# Patient Record
Sex: Male | Born: 1945 | Race: White | Hispanic: No | Smoking: Never smoker
Health system: Southern US, Community
[De-identification: ages and names within clinical notes are randomized; demographics above are authoritative.]

## PROBLEM LIST (undated history)

## (undated) DIAGNOSIS — J45909 Unspecified asthma, uncomplicated: Secondary | ICD-10-CM

## (undated) DIAGNOSIS — I10 Essential (primary) hypertension: Secondary | ICD-10-CM

## (undated) HISTORY — PX: REPLACEMENT TOTAL KNEE: SUR1224

## (undated) HISTORY — PX: TOTAL HIP ARTHROPLASTY: SHX124

---

## 2000-11-16 ENCOUNTER — Other Ambulatory Visit: Admission: RE | Admit: 2000-11-16 | Discharge: 2000-11-16 | Payer: Self-pay | Admitting: Urology

## 2000-11-16 ENCOUNTER — Encounter (INDEPENDENT_AMBULATORY_CARE_PROVIDER_SITE_OTHER): Payer: Self-pay | Admitting: Specialist

## 2000-12-25 ENCOUNTER — Encounter (INDEPENDENT_AMBULATORY_CARE_PROVIDER_SITE_OTHER): Payer: Self-pay | Admitting: *Deleted

## 2000-12-25 ENCOUNTER — Ambulatory Visit (HOSPITAL_COMMUNITY): Admission: RE | Admit: 2000-12-25 | Discharge: 2000-12-25 | Payer: Self-pay | Admitting: Gastroenterology

## 2001-08-31 ENCOUNTER — Encounter: Admission: RE | Admit: 2001-08-31 | Discharge: 2001-08-31 | Payer: Self-pay | Admitting: Family Medicine

## 2001-08-31 ENCOUNTER — Encounter: Payer: Self-pay | Admitting: Family Medicine

## 2002-08-20 ENCOUNTER — Encounter: Payer: Self-pay | Admitting: Family Medicine

## 2002-08-20 ENCOUNTER — Encounter: Admission: RE | Admit: 2002-08-20 | Discharge: 2002-08-20 | Payer: Self-pay | Admitting: Family Medicine

## 2002-11-26 ENCOUNTER — Encounter: Payer: Self-pay | Admitting: Orthopedic Surgery

## 2002-12-02 ENCOUNTER — Inpatient Hospital Stay (HOSPITAL_COMMUNITY): Admission: RE | Admit: 2002-12-02 | Discharge: 2002-12-05 | Payer: Self-pay | Admitting: Orthopedic Surgery

## 2003-09-17 ENCOUNTER — Inpatient Hospital Stay (HOSPITAL_COMMUNITY): Admission: RE | Admit: 2003-09-17 | Discharge: 2003-09-21 | Payer: Self-pay | Admitting: Orthopedic Surgery

## 2004-07-26 ENCOUNTER — Encounter: Admission: RE | Admit: 2004-07-26 | Discharge: 2004-07-26 | Payer: Self-pay | Admitting: Family Medicine

## 2007-06-08 ENCOUNTER — Inpatient Hospital Stay (HOSPITAL_COMMUNITY): Admission: EM | Admit: 2007-06-08 | Discharge: 2007-06-11 | Payer: Self-pay | Admitting: Emergency Medicine

## 2010-06-04 ENCOUNTER — Encounter (HOSPITAL_COMMUNITY): Payer: Self-pay | Admitting: Radiology

## 2010-06-04 ENCOUNTER — Emergency Department (HOSPITAL_COMMUNITY)
Admission: EM | Admit: 2010-06-04 | Discharge: 2010-06-04 | Disposition: A | Discharge status: Home / Self Care | Payer: Non-veteran care | Attending: Emergency Medicine | Admitting: Emergency Medicine

## 2010-06-04 ENCOUNTER — Emergency Department (HOSPITAL_COMMUNITY): Payer: Non-veteran care

## 2010-06-04 DIAGNOSIS — Z794 Long term (current) use of insulin: Secondary | ICD-10-CM | POA: Insufficient documentation

## 2010-06-04 DIAGNOSIS — R109 Unspecified abdominal pain: Secondary | ICD-10-CM | POA: Insufficient documentation

## 2010-06-04 DIAGNOSIS — K5900 Constipation, unspecified: Secondary | ICD-10-CM | POA: Insufficient documentation

## 2010-06-04 DIAGNOSIS — I10 Essential (primary) hypertension: Secondary | ICD-10-CM | POA: Insufficient documentation

## 2010-06-04 DIAGNOSIS — K449 Diaphragmatic hernia without obstruction or gangrene: Secondary | ICD-10-CM | POA: Insufficient documentation

## 2010-06-04 DIAGNOSIS — R112 Nausea with vomiting, unspecified: Secondary | ICD-10-CM | POA: Insufficient documentation

## 2010-06-04 DIAGNOSIS — K8020 Calculus of gallbladder without cholecystitis without obstruction: Secondary | ICD-10-CM | POA: Insufficient documentation

## 2010-06-04 DIAGNOSIS — Z96659 Presence of unspecified artificial knee joint: Secondary | ICD-10-CM | POA: Insufficient documentation

## 2010-06-04 DIAGNOSIS — E119 Type 2 diabetes mellitus without complications: Secondary | ICD-10-CM | POA: Insufficient documentation

## 2010-06-04 HISTORY — DX: Essential (primary) hypertension: I10

## 2010-06-04 LAB — CBC
Hemoglobin: 10.5 g/dL — ABNORMAL LOW (ref 13.0–17.0)
Platelets: 317 10*3/uL (ref 150–400)
RBC: 3.34 MIL/uL — ABNORMAL LOW (ref 4.22–5.81)
WBC: 15.1 10*3/uL — ABNORMAL HIGH (ref 4.0–10.5)

## 2010-06-04 LAB — BASIC METABOLIC PANEL
CO2: 24 mEq/L (ref 19–32)
Chloride: 101 mEq/L (ref 96–112)
GFR calc Af Amer: >60 mL/min (ref >60)
Potassium: 3.4 mEq/L — ABNORMAL LOW (ref 3.5–5.1)
Sodium: 133 mEq/L — ABNORMAL LOW (ref 135–145)

## 2010-06-04 LAB — HEPATIC FUNCTION PANEL
AST: 41 U/L — ABNORMAL HIGH (ref 0–37)
Albumin: 2.4 g/dL — ABNORMAL LOW (ref 3.5–5.2)
Alkaline Phosphatase: 65 U/L (ref 39–117)
Total Bilirubin: 1.2 mg/dL (ref 0.3–1.2)
Total Protein: 6.3 g/dL (ref 6.0–8.3)

## 2010-06-04 LAB — DIFFERENTIAL
Basophils Absolute: 0 10*3/uL (ref 0.0–0.1)
Basophils Relative: 0 % (ref 0–1)
Monocytes Relative: 11 % (ref 3–12)
Neutro Abs: 10.7 10*3/uL — ABNORMAL HIGH (ref 1.7–7.7)
Neutrophils Relative %: 71 % (ref 43–77)

## 2010-06-30 ENCOUNTER — Ambulatory Visit: Payer: Non-veteran care | Attending: Physician Assistant | Admitting: Physical Therapy

## 2010-06-30 DIAGNOSIS — M25559 Pain in unspecified hip: Secondary | ICD-10-CM | POA: Insufficient documentation

## 2010-06-30 DIAGNOSIS — R262 Difficulty in walking, not elsewhere classified: Secondary | ICD-10-CM | POA: Insufficient documentation

## 2010-06-30 DIAGNOSIS — IMO0001 Reserved for inherently not codable concepts without codable children: Secondary | ICD-10-CM | POA: Insufficient documentation

## 2010-06-30 DIAGNOSIS — M6281 Muscle weakness (generalized): Secondary | ICD-10-CM | POA: Insufficient documentation

## 2010-07-06 NOTE — H&P (Signed)
NAME:  Jeff Rodriguez, GRAPE NO.:  [id number]   MEDICAL RECORD NO.:  [id number]          PATIENT TYPE:  INP   LOCATION:  1830                         FACILITY:  [facility]   PHYSICIAN:  Ollen Gross, M.D.    DATE OF BIRTH:  [birth year]   DATE OF ADMISSION:  06/08/2007  DATE OF DISCHARGE:                              HISTORY & PHYSICAL   CHIEF COMPLAINT:  Right hip pain.   HISTORY OF PRESENT ILLNESS:  The patient is a 65 year old male who  injured his hip on the date of admission, when he was unloading a riding  lawn mower off a trailer.  A couple of gentleman were helping him.  The  lawn mower was unhitched and started rolling down the trailer and caught  his leg, injuring his right hip and leg.  He had the onset of pain and  was unable to stand and walk.  He was brought to the ED, where x-ray  showed he sustained a mildly displaced femoral neck fracture.  The  patient was subsequently admitted to the hospital.   ALLERGIES:  IODINE.   CURRENT MEDICATIONS:  1. Glucovance.  2. Metformin.  3. Zocor.  4. Aspirin 81 mg.  5. Actos.  6. Lisinopril.  7. Albuterol inhaler, which he uses very rarely.   PAST MEDICAL HISTORY:  1. Diabetes.  2. Asthma.  3. History of a clavicle fracture.  4. History of right wrist fracture.   PAST SURGICAL HISTORY:  1. Left total knee in October 2004.  2. Right total knee in July 2005.   SOCIAL HISTORY:  Married, 2 children.  Denies use of tobacco, alcohol,  or other products.   FAMILY HISTORY:  Father deceased at 64 with lung cancer.  Mother living  at age 22, relatively good health.   REVIEW OF SYSTEMS:  GENERAL:  No fevers, chills, or night sweats.  NEURO:  No seizures, syncope, or paralysis.  RESPIRATORY:  No shortness  of breath.  No cough or hemoptysis.  CARDIOVASCULAR:  No chest pain.  GI:  No nausea, vomiting, diarrhea, or constipation.  GU:  No dysuria,  hematuria, or discharge.  MUSCULOSKELETAL:  Right hip.   PHYSICAL  EXAMINATION:  VITAL SIGNS:  Pulse 96, respirations 16, blood  pressure 135/83, and temp 98.3.  GENERAL:  The patient is a 65 year old white male, well nourished, well  developed, in no acute distress.  Appears to be a good historian.  He is  alert, oriented, and cooperative.  Seen on stretcher in the emergency  department.  HEENT:  Normocephalic and atraumatic.  Pupils equal, round, and  reactive.  Oropharynx clear.  EOMs intact.  NECK:  Supple.  CHEST:  Clear anterior and posterior chest walls.  No rhonchi, rales, or  wheezing.  HEART:  Regular rate and rhythm.  No murmur.  S1 and S2 noted.  ABDOMEN:  Soft and nontender.  Bowel sounds present.  BREASTS, RECTAL, GENITALIA: Not done, not pertinent to present illness.  EXTREMITIES:  Right leg is slightly shortened and externally rotated as  compared to the left.  Motor function intact.  Sensation intact.  Pulses  noted to be intact.   IMPRESSION:  1. Right femoral neck fracture.  2. Diabetes mellitus.  3. Asthma.  4. History of clavicle fracture.  5. History of right wrist fracture.   PLAN:  The patient admitted to [facility] to undergo a right  hip pinning.  Surgery will be performed by Dr. Ollen Gross.      Alexzandrew L. Perkins, P.A.C.      Ollen Gross, M.D.  Electronically Signed    ALP/MEDQ  D:  06/09/2007  T:  06/09/2007  Job:  [id number]

## 2010-07-06 NOTE — Op Note (Signed)
NAME:  Jeff Rodriguez, Jeff Rodriguez NO.:  [id number]   MEDICAL RECORD NO.:  [id number]          PATIENT TYPE:  INP   LOCATION:  1830                         FACILITY:  [facility]   PHYSICIAN:  Ollen Gross, M.D.    DATE OF BIRTH:  [birth year]   DATE OF PROCEDURE:  06/09/2007  DATE OF DISCHARGE:                               OPERATIVE REPORT   PREOPERATIVE DIAGNOSIS:  Partially displaced right femoral neck  fracture.   POSTOPERATIVE DIAGNOSIS:  Partially displaced right femoral neck  fracture.   PROCEDURE:  Closed reduction and pinning, right femoral neck fracture.   SURGEON:  Dr. Lequita Halt.   ASSISTANT:  Alexzandrew L. Perkins, PA-C.   ANESTHESIA:  General.   ESTIMATED BLOOD LOSS:  Minimal.   DRAINS:  None.   COMPLICATIONS:  None.   CONDITION:  Stable.   BRIEF CLINICAL NOTE:  Mr. Karwowski is a 65 year old male who had a fall  today at home when he was attempting to help get a lawn mower off the  truck.  He slid and landed on his right hip.  He had immediate pain and  inability to get up.  He was taken to the emergency room where he had a  Garden III femoral neck fracture.  He presents now for closed reduction  and pinning.   PROCEDURE IN DETAIL:  After successful administration of general  anesthetic, the patient was placed on a fracture table with the right  lower extremity in a well-padded traction boot, left lower extremity in  well-padded leg holder.  Under fluoroscopic guidance, I applied minimal  traction, rotated internally to get him anatomically reduced.  There was  just a tiny bit of step-off inferiorly on the femoral neck.  Once, we  had it in adequate position, his thighs were prepped and draped in the  usual sterile fashion.  I placed a guidepin over the anterior thigh to  gauge our starting point for the pinning.  A small incision is made on  the lateral thigh.  I cut down through the subcutaneous tissue of fascia  lata, which was incised in line of the  skin incision.  We placed the  first guidepin so as to be in the center of femoral head and neck on the  AP and lateral views.  We put one pin superior to this and the other  inferior parallel pins.  The lengths are 90-mm, 95-mm, and 95-mm  respectively.  The 6.5-mm screw was then passed over the guidepins and  then tightened down to the lateral cortex of the femur.  It is shown to  be in excellent position in AP and lateral views.  I was very satisfied  with the reduction.  We then irrigated with saline solution, closed the  fascia lata with an interrupted 0 Vicryl, subcu with interrupted 2-0  Vicryl, and subcuticular with 4-0 Monocryl.  A  20 mL of 0.25% Marcaine with epinephrine was injected into the subcu  tissues and deep tissue around the incision.  Steri-Strips and bulky  sterile dressing were then applied, and he was awakened and transferred  to recovery room  in stable condition.      Ollen Gross, M.D.  Electronically Signed     FA/MEDQ  D:  06/09/2007  T:  06/09/2007  Job:  [id number]

## 2010-07-07 ENCOUNTER — Ambulatory Visit: Payer: Non-veteran care | Admitting: Physical Therapy

## 2010-07-09 ENCOUNTER — Ambulatory Visit: Payer: Non-veteran care | Admitting: Physical Therapy

## 2010-07-09 NOTE — Discharge Summary (Signed)
NAME:  Jeff Rodriguez, Jeff Rodriguez NO.:  [id number]   MEDICAL RECORD NO.:  [id number]          PATIENT TYPE:  INP   LOCATION:  5024                         FACILITY:  [facility]   PHYSICIAN:  Ollen Gross, M.D.    DATE OF BIRTH:  October 05, 1945   DATE OF ADMISSION:  06/08/2007  DATE OF DISCHARGE:  06/11/2007                               DISCHARGE SUMMARY   ADMITTING DIAGNOSES:  1. Right femoral neck fracture.  2. Diabetes mellitus.  3. Asthma.  4. History of clavicle fracture.  5. History of wrist fracture.   DISCHARGE DIAGNOSES:  1. Partially displaced right femoral neck fracture, status post closed      reduction pinning of the right femoral neck.  2. Diabetes mellitus.  3. Asthma.  4. History of clavicle fracture.  5. History of wrist fracture.   PROCEDURE:  On June 09, 2007, closed reduction pinning of right femoral  neck.   SURGEON:  Ollen Gross, MD   ASSISTANT:  Alexzandrew L. Julien Girt, PA-C   ANESTHESIA:  General.   CONSULTS:  None.   BRIEF HISTORY:  Mr. On is a 65 year old male, who had a fall on the  date of admission, attempting to get a lawn mower off a truck.  The lawn  mower rolled back, knocking him down and landing onto his hip.  He had  immediate onset of pain, brought to the emergency room and found to have  femoral neck fracture, and admitted for surgical intervention.   LABORATORY DATA:  CBC on admission showed hemoglobin of 14.3, hematocrit  42.4, white cell count 15.9, and platelets 234.  Differential  neutrophils 77, lymphs 15, monos 7, eosos 7, and basos 0.  Postop  hemoglobin 13.3, last H and H 13.7 and 39.5.  PT/PTT on preop 13.4 and  28 respectively.  INR 1.0.  Serial protimes followed by serial PT/INR  16.3 and 1.3.  CMET on admission, elevated glucose of 206, known  diabetic.  Remaining Chem panel, all within normal limits.  Serial BMETs  are followed.  Electrolytes remained within normal limits.  Glucose went  up to 231.   X-rays of hip film on June 09, 2007, interoperative right hip fixation.   EKG on June 08, 2007, normal sinus rhythm with short PR, otherwise  normal.  Confirmed by Dr. Greta Doom.   HOSPITAL COURSE:  The patient admitted to [facility] on June 08, 2007, for the above-stated problem.  Placed at bedrest and underwent  lab work.  He was taken to the operating room later that evening in the  early hour mornings of June 09, 2007, about 1:30 in the morning.  He  underwent the above procedure without difficulty, tolerated procedure  well, later brought to recovery room orthopedic floor, given p.o. and IV  analgesic pain control.  Following surgery, she was given 24-hour  postoperative antibiotics.  Started on Coumadin.  Later that afternoon  of postop day 1, he was doing well.  Started getting out of bed.  Weightbearing as tolerated.  Did actually walk in the hallway on day  one, by 2, he was  doing better.  Leg felt little heavy due to the  weakness.  Hemoglobin was stable at 13.5.  Dressing was changed,  incision looked good.  Blood pressure was stable.  Continue to receive  therapy.  Discharge planning got involved to help arrange for  postoperative home health care.  On the morning of day 3, on June 18, 2007, the patient is doing well, got up and down steps, walking over 200  feet.  Tolerating his meds well.  INR was slowly coming up to 1.3.  We  gave him 1 dose of Lovenox and discharged home later that day.   DISCHARGE PLAN:  The patient discharged home on June 11, 2007.   DISCHARGE DIAGNOSIS:  Please see above.   DISCHARGE MEDICATIONS:  Include Percocet, Robaxin, Coumadin, and he is  to start back on his aspirin once he stopped his Coumadin protocol.   ACTIVITY:  Weightbearing as tolerated, home health PT, and home health  nursing.  Gait training ambulation.   FOLLOWUP:  He needs to follow with Dr. Lequita Halt, 2 weeks from date of  surgery, contact the office at  [phone].   DISPOSITION:  Home.  He may start showering.   CONDITION ON DISCHARGE:  Improved.      Alexzandrew L. Perkins, P.A.C.      Ollen Gross, M.D.  Electronically Signed    ALP/MEDQ  D:  07/27/2007  T:  07/28/2007  Job:  [id number]

## 2010-07-09 NOTE — Op Note (Signed)
NAME:  Jeff Rodriguez, Jeff Rodriguez                       ACCOUNT NO.:  [id number]   MEDICAL RECORD NO.:  [id number]                   PATIENT TYPE:  INP   LOCATION:  2869                                 FACILITY:  [facility]   PHYSICIAN:  Elana Alm. Thurston Hole, M.D.              DATE OF BIRTH:  August 25, 1945   DATE OF PROCEDURE:  09/17/2003  DATE OF DISCHARGE:                                 OPERATIVE REPORT   PREOPERATIVE DIAGNOSIS:  Right knee degenerative joint disease.   POSTOPERATIVE DIAGNOSIS:  Right knee degenerative joint disease.   PROCEDURE:  Right total knee replacement using [employer] Scorpio total knee  system with #7 cemented femur, #9 cemented tibia, with 15 mm polyethylene  flex tibial spacer and 28 mm polyethylene cemented patella.   SURGEON OF RECORD:  Elana Alm. Thurston Hole, M.D.   ASSISTANT:  Julien Girt, P.A.   OPERATIVE TIME:  1 hour 20 minutes.   COMPLICATIONS:  None.   DESCRIPTION OF PROCEDURE:  Mr. Jeff Rodriguez was brought to the operating room on  September 17, 2003, after a femoral nerve block had been placed in the holding  room by anesthesia.  He was placed on the operating table in supine  position.  He received Ancef 1 g IV preoperatively for prophylaxis.  After  being placed under general anesthesia, he had a Foley catheter placed under  sterile conditions.  His right knee was examined under anesthesia.  Range of  motion from -5 to 125 degrees with mild varus deformity, the knee stable to  ligamentous exam with normal patellar tracking.  The right leg was prepped  using sterile Duraprep and draped using sterile technique.  The leg was  exsanguinated and a thigh tourniquet elevated to 375 mm.  Initially through  a 15 cm longitudinal incision based over the patella, initial exposure was  made.  The underlying subcutaneous tissues were incised along with the skin  incision.  A median arthrotomy was performed, revealing an excessive amount  of normal-appearing joint fluid.  The  articular surfaces were inspected.  He  had grade 4 changes medially, grade 3 changes laterally, and grade 3-4  changes in the patellofemoral joint.  Osteophytes were removed from the  femoral condyles and tibial plateau.  Medial and lateral meniscal remnants  were removed as well as the anterior cruciate ligament.  The intramedullary  drill was then drilled up the femoral canal for placement of the distal  femoral cutting jig, which was placed in the appropriate amount of rotation  and a distal 12 mm cut was made.  The distal femur was then sized.  A #7 was  found to be the appropriate size, and a #7 cutting jig was placed and then  these cuts were made.  The proximal tibia was then exposed.  The tibial  spines were removed with an oscillating saw.  Intramedullary drill was then  drilled down the tibial canal for placement of the  proximal tibial cutting  jig, which was placed in the appropriate amount of rotation and a proximal 6  mm cut was made.  After this was done the Scorpio PCL cutter was placed back  on the distal femur and these cuts were made.  At this point, then the #7  femoral trial was placed.  The #9 tibial base plate trial was placed and  with a 15 mm polyethylene spacer, with a 15 mm polyethylene spacer there was  found to be excellent restoration of normal alignment, excellent stability,  range of motion of 0-120 degrees.  The tibial base plate was then marked for  rotation and the keel cut was made.  After this was done the patella was  sized.  A 28 mm was found to be the appropriate size, and a recessed 10 x 28  mm cut was made and three locking holes were placed.  At this time it was  felt that all the trial components were of excellent size, fit, and  stability.  They were then removed.  The knee was then jet-lavaged,  irrigated with 3 L of saline solution.  The proximal tibia was then exposed  and the #9 tibial base plate with cement backing was hammered into  position.  It had excellent fit, with excess cement being removed from around the  edges.  The #7 femoral component with cement backing was hammered into  position also with an excellent fit, with excess cement being removed from  around the edges.  The 15 mm polyethylene tibial spacer was then locked on  the tibial base plate.  The knee was taken through a range of motion 0-125  degrees with excellent stability, excellent correction of his varus  deformity.  A 28 mm polyethylene cement-backed patella was then locked into  its recessed hole and held there with a clamp.  After the cement had  hardened, patellofemoral tracking was evaluated and this was found to be  normal.  At this point it was felt that all the components were of excellent  size, fit, and stability.  The knee was further irrigated with saline and  then the arthrotomy was closed with #1 Ethibond suture over two medium  Hemovac drains.  Subcutaneous tissue was closed with 0 and 2-0 Vicryl and  the skin closed with skin staples.  Sterile dressings were applied  Hemovac  injected with 0.25% Marcaine with epinephrine and clamped.  The tourniquet  was released.  Patient awakened, extubated, and taken to the recovery room  in stable condition.  Needle and sponge counts correct x2 at the end of the  case.                                               Robert A. Thurston Hole, M.D.    RAW/MEDQ  D:  09/17/2003  T:  09/17/2003  Job:  [id number]

## 2010-07-09 NOTE — Discharge Summary (Signed)
NAME:  STETSON, PELAEZ                       ACCOUNT NO.:  [id number]   MEDICAL RECORD NO.:  [id number]                   PATIENT TYPE:  INP   LOCATION:  5015                                 FACILITY:  [facility]   PHYSICIAN:  Elana Alm. Thurston Hole, M.D.              DATE OF BIRTH:  February 08, 1946   DATE OF ADMISSION:  12/02/2002  DATE OF DISCHARGE:  12/05/2002                                 DISCHARGE SUMMARY   ADMISSION DIAGNOSES:  1. Left knee degenerative joint disease.  2. Diabetes.  3. Hypertension.  4. Asthma.   DISCHARGE DIAGNOSES:  1. Left knee degenerative joint disease.  2. Diabetes.  3. Hypertension.  4. Asthma.   HISTORY OF PRESENT ILLNESS:  The patient is a 65 year old male who has a  longstanding history of bilateral knee DJD, left greater than right.  Tried  anti-inflammatories, cortisone injection, and a debriding arthroscopy, all  without success.  He has pain at night, pain at rest, pain with activity,  uncontrolled by p.o. pain medications.  He understands risks, benefits, and  potential complications of a left total knee replacement and is without  question.   PROCEDURES:  On December 02, 2002, the patient underwent a left total knee by  Dr. Thurston Hole followed by a left femoral nerve block by anesthesia.  He  tolerated both procedures well.  He was admitted postoperatively for pain  control, DVT prophylaxis, and physical therapy.   HOSPITAL COURSE:  On postop day #1, the patient was doing well.  Temperature  was 98.1.  Hemoglobin was 11.1.  INR was 1.2.  He was metabolically stable.  He had moderate drainage from his left leg.  He was 0 to 50 on CPM.  His  drain was discontinued.  His PCA was discontinued.  He was started on  Percocet for pain.   On postop day #2, patient doing well without complaint.  T-max 100.4.  Hemoglobin was 10.1.  Surgical wound was well approximated.  Neurovascularly  intact.  His IV was discontinued.  He was given permission to shower with  an  OpSite over his wound.  He is up with physical therapy.   On postop day #3, the patient was nauseated, had not had a bowel movement.  T-max 100.9.  Potassium was 3.2.  He was given K-Dur 40 mEq p.o.  He was  discharged late in the afternoon after a bowel movement.   He was discharged to home, weightbearing as tolerated, in stable condition,  on a regular diet.   DISCHARGE MEDICATIONS:  1. Percocet 5/325 one to two q.4-6h. p.r.n. pain,  2. Coumadin 5 mg 2 tablets a day.  3. Crestor 10 mg 1 tablet a day.  4. Altace 10 mg one tablet a day.  5. Glucovance 5/500 one tablet a day.  6. Avandia 8 mg 1 tablet once a day.  7. Colace 100 mg 1 tablet twice a day.  8. Senokot S  2 tablets before dinner.   DISCHARGE INSTRUCTIONS:  1. He has been instructed to call with increased pain, increased redness,     increased drainage, or increased temperature.  2. He has also been instructed to make an appointment for October 25 or     October 26.      Kirstin Shepperson, P.A.                  Robert A. Thurston Hole, M.D.    KS/MEDQ  D:  01/24/2003  T:  01/25/2003  Job:  [id number]

## 2010-07-09 NOTE — Procedures (Signed)
[facility]  Patient:    Jeff Rodriguez, Jeff Rodriguez Visit Number: [id number] MRN: [id number]          Service Type: END Location: ENDO Attending Physician:  Nelda Marseille Dictated by:   Petra Kuba, M.D. Proc. Date: 12/25/00 Admit Date:  12/25/2000   CC:         Elvina Sidle, M.D.   Procedure Report  PROCEDURE:  Colonoscopy with multiple polypectomies.  INDICATIONS:  Screening.  Consent was signed after risks, benefits, methods and options were thoroughly discussed in the office.  MEDICATIONS:  Demerol 50, Versed 7.5.  PROCEDURE:  Rectal inspection was pertinent for external hemorrhoids.  Digital examination was negative.  Video colonoscope was inserted fairly easily advanced around the colon to the cecum.  On insertion we thought in the midtransverse was a small polyp seen and I thought we were able to be fine on the way back.  No other abnormalities were seen on insertion.  The cecum was identified by the appendiceal orifice and the ileocecal valve. Just next to the appendiceal orifice was a small pedunculated polyp which was carefully snared with a setting of 15 and 15.  Electrocautery was applied. Polyp was removed and suctioned through the scope and collected in the trap. Possibly, there was some residual tissue on the edge that was polypoid and two cold biopsies of that were obtained and put in the first container.  The scope was slowly withdrawn.  There was a rare right sided diverticula seen and just an occasional left sided on slow withdrawn.  In the more proximal transverse, two small polyps were seen.  One was snared, electrocautery applied, suctioned through the scope and collected in the trap.  The other one was hot biopsied. The scope was further withdrawn back to the splenic flexure.  No additional polyps were seen.  It was hard to know for sure if one of the two polyps we removed were the same ones we saw on insertion.   We went ahead and readvanced to the hepatic flexure a few times but could not find it.  The scope was then slowly withdrawn.  There was the occasional left-sided diverticula seen but no other polyps seen until we withdrew to the proximal rectum where a small polyp was seen, snared and electrocautery applied.  The polyp was suctioned through the scope and put in the third container.  Also in the rectum were three other tiny polyps which were hot biopsied and one other small polyp that was snared.  Electrocautery was applied and suctioned through the scope and collected in the trim.  The scope was retroflexed revealing some internal hemorrhoids.  The scope was drained and readvanced probably to the midtransverse.  Up to that point, the scope began to loop and again on slow withdrawal one more time the polyp seen on insertion we thought was not found. The air was suctioned and scope removed.  The patient tolerated the procedure well.  There was no obvious immediate complications.  ENDOSCOPIC DIAGNOSIS: 1. Internal and external hemorrhoid. 2. Occasional left rare right diverticula. 3. Multiple small rectal polyps.  Two snares were hot biopsied. 4. Transverse with two small polyps seen, one snared and one hot biopsied    more proximally. 5. Cecal small polyp snared and two cold biopsies of the edge. 6. Questionably one small transverse polyp seen on insertion but not    found on withdrawal with multiple looks. 7. Otherwise, within normal limits to the  terminal ileum.  PLAN: 1. Await pathology. 2. Two week customary and post-polypectomy instructions. 3. Happy to see back p.r.n., otherwise, return care to Dr. Milus Glazier for    the customary yearly rectals and guaiacs.  Probably will check his    screening a little sooner secondary to the possible missed polyp which    if we repeat colonoscopy would probably if seen on insertion remove at    that junction next time. Dictated by:   Petra Kuba, M.D. Attending Physician:  Nelda Marseille DD:  12/25/00 TD:  12/26/00 Job: [id number]

## 2010-07-09 NOTE — Discharge Summary (Signed)
NAME:  Jeff Rodriguez, Jeff Rodriguez                       ACCOUNT NO.:  [id number]   MEDICAL RECORD NO.:  [id number]                   PATIENT TYPE:  INP   LOCATION:  5010                                 FACILITY:  [facility]   PHYSICIAN:  Elana Alm. Thurston Hole, M.D.              DATE OF BIRTH:  Jun 24, 1945   DATE OF ADMISSION:  09/17/2003  DATE OF DISCHARGE:  09/21/2003                                 DISCHARGE SUMMARY   ADMISSION DIAGNOSES:  1.  End-stage degenerative joint disease right knee.  2.  Diabetes.  3.  Hypertension.   DISCHARGE DIAGNOSES:  1.  End-stage degenerative joint disease right knee.  2.  Diabetes.  3.  Hypertension.   HISTORY OF PRESENT ILLNESS:  The patient is a 65 year old male who has end-  stage DJD of his right knee.  He has tried conservative care including anti-  inflammatories, physical therapy, cortisone injections, and debriding  arthroscopy all without success.  He has pain at night, pain at rest, pain  with activity.  He understands the risks, benefits, and possible  complications of a right total knee replacement and is without question.   He received medical clearance from Dr. Allyson Sabal at [facility] due to a significant cardiac history.   PROCEDURES IN HOUSE:  On September 17, 2003, the patient underwent a right total  knee replacement by Dr. Thurston Hole.  He tolerated the procedure well.  Postoperatively, he had a femoral nerve block by anesthesia.  He was  admitted postop for pain control, DVT prophylaxis, and physical therapy.  Postop day #1, temperature of 100.2.  Surgical wound was well approximated.  His hemoglobin was 12.1.  His INR was 1.1.  He was slightly hyponatremic at  132 but was metabolically stable otherwise.  Sugars were slightly high at  185.  He was up with physical therapy and started on Coumadin.  Postop day  #2, T-max of 100, surgical wound was well approximated.  His hemoglobin was  11.6.  He had normalized his sodium to 135.  His  sugars were still a little  high at 174.  His surgical wound was well approximated.  He progressed well  with therapy and had good understanding of occupational therapy.  Postop day  #3, T-max of 100.8, hemoglobin was 10.7.  He was metabolically stable.  Sugar was 154.  His calf was soft.  Surgical wounds were well approximated.  Postop day #4, he was discharged to home in stable condition, T-max of 99,  hemoglobin was 10.  He was metabolically stable.  His sugar was 158.  His  surgical wound was well approximated.  He was discharged to home,  weightbearing as tolerated, on a regular diabetic diet.   DISCHARGE MEDICATIONS:  1.  Percocet one to two q.4-6 h. p.r.n. pain.  2.  Robaxin 500 mg one q.6 h. p.r.n. spasm.  3.  Coumadin 5 mg 1-1/2 tablets a day.  4.  Glucovance 5/500 mg one tablet every day.  5.  Altace 10 mg one tablet once a day.  6.  Avandia 8 mg one tablet once a day.  7.  Lovenox injections 30 mg one injection subcu twice a day until his INR      is therapeutic.   He has been instructed to use his CPM 0 to 60 degrees 8 hours a day,  increase by 5 degrees a day until 90 degrees.  He has been instructed to  place a folded pillow under his right heel for 30 minutes a day to passively  work on extension.  He will have home health PT, OT, RN, and a CPM.  He will  call with an increased pain, increased redness, increased drainage, or a  temp greater than 101.  He will follow up with Dr. Thurston Hole on September 30, 2003.      Kirstin Shepperson, P.A.                  Robert A. Thurston Hole, M.D.    KS/MEDQ  D:  10/22/2003  T:  10/22/2003  Job:  [id number]

## 2010-07-09 NOTE — Op Note (Signed)
NAME:  Jeff Rodriguez, Jeff Rodriguez                       ACCOUNT NO.:  [id number]   MEDICAL RECORD NO.:  [id number]                   PATIENT TYPE:  INP   LOCATION:  5037                                 FACILITY:  [facility]   PHYSICIAN:  Elana Alm. Thurston Hole, M.D.              DATE OF BIRTH:  1945-12-08   DATE OF PROCEDURE:  12/02/2002  DATE OF DISCHARGE:                                 OPERATIVE REPORT   PREOPERATIVE DIAGNOSIS:  Left knee degenerative joint disease.   POSTOPERATIVE DIAGNOSIS:  Left knee degenerative joint disease.   OPERATION PERFORMED:  Left total knee replacement using [employer] Scorpio  total knee system with #7 cemented femoral component, #9 cemented tibial  component with 18 mm polyethylene flexed tibial spacer and a 28 mm  polyethylene cemented patella.   SURGEON:  Elana Alm. Thurston Hole, M.D.   ASSISTANT:  Julien Girt, P.A.   ANESTHESIA:  General.   OPERATIVE TIME:  One hour and 20 minutes.   COMPLICATIONS:  None.   DESCRIPTION OF PROCEDURE:  Mr. Agyeman was brought to the operating room on  December 02, 2002, placed on the operating table in supine position.  After  an adequate level of general anesthesia was obtained, his left knee was  examined under anesthesia.  He had range of motion from -5 to 125 degrees  with 1+ crepitation with significant varus deformity, knee stable,  ligamentous exam with normal patella tracking.  He had a Foley catheter  placed under sterile conditions and received Ancef 1 gm IV preoperatively  for prophylaxis.  Initially, the leg was prepped using sterile Hibiclens and  alcohol and draped using sterile technique.  The leg was exsanguinated and a  thigh tourniquet elevated to 370 mmHg.  Initially, through a 15 cm  longitudinal incision based over the patella, initial exposure was made.  The underlying subcutaneous tissues were incised in line with the skin  incision.  A median arthrotomy was performed revealing an excessive amount  of  normal-appearing joint fluid.  The articular surfaces were inspected.  He  had grade 4 changes medially, grade 3 changes laterally and grade 3 and 4  changes in the patellofemoral joint.  The osteophytes were removed from the  femoral condyles and tibial plateau.  The medial and lateral meniscal  remnants were removed as well as the anterior cruciate ligament.  The  intramedullary drill was then drilled up the femoral canal for placement of  the distal femoral  jig which was placed in the appropriate amount of  rotation and a distal 12 mm cut was made.  Distal femur was then sized.  A  #7 was found to be the appropriate size and a #7 cutting jig was placed and  these cuts were made.  The proximal tibia was then exposed.  Tibial spines  were removed with an oscillating saw.  Intramedullary drill was drilled down  the tibial canal for placement of the  proximal tibial cutting jig which was  placed in the appropriate amount of rotation and a proximal 4 mm cut was  made.  After this was done, the Scorpio PCL cutter was placed back on the  distal femur and these cuts were made.  At this point then the femoral trial  #7 was placed.  The #9 tibial base trial  was placed and with an 18 mm  polyethylene spacer, there was found to be excellent correction of his  deformity, excellent stability, range of motion 0 to 125 degrees with no  lift off on the tray.  The tibial base plate was then marked for rotation  and the keel cut was made.  After this was done, the patella was sized.  A  28 mm was found to be the appropriate size and a recessed 10 mm x 28 mm cut  was made and three locking holes were placed.  After this was done, it was  felt that all of the trial components were of excellent size, fit and  stability. They were then removed.  The knee was then jet lavage irrigated  with three liters of saline solution.  The proximal tibia was then exposed.  A #9 tibial base plate with cement backing was  then hammered into position  with an excellent fit with excess cement being removed from around the  edges.  The #7 femoral component with cement backing was hammered into  position also with an excellent fit with excess cement being removed from  around the edges.  The 18 mm polyethylene flexed tibial spacer was then  locked on the tibial base plate and the knee taken through a range of motion  0 to 125 degrees with excellent stability, no lift off on the tray.  The 28  mm polyethylene cement backed patella was then locked into this recessed  hole and held there with a clamp.  After the cement hardened, the  patellofemoral tracking was evaluated and this was found to be normal.  At  this point it was felt that all components were of excellent size, fit and  stability. The knee was further irrigated with antibiotic solution and then  the median arthrotomy was closed with #1 Ethibond suture over two medium  Hemovac drains.  Subcutaneous tissues were closed with 0 and 2-0 Vicryl.  Skin closed with skin staples.  Sterile dressings were applied.  The Hemovac  injected with 0.25% Marcaine with epinephrine and clamped.  Tourniquet was  released.  The patient then had a femoral nerve block placed by anesthesia  for postoperative pain control.  He was then awakened, extubated and taken  to recovery room in stable condition.  The sponge and needle counts were  correct times two at the end of this case.                                                Robert A. Thurston Hole, M.D.    RAW/MEDQ  D:  12/02/2002  T:  12/02/2002  Job:  [id number]

## 2010-07-13 ENCOUNTER — Ambulatory Visit: Payer: Non-veteran care | Admitting: Physical Therapy

## 2010-07-16 ENCOUNTER — Ambulatory Visit: Payer: Non-veteran care | Admitting: Physical Therapy

## 2010-11-16 LAB — COMPREHENSIVE METABOLIC PANEL
ALT: 19
AST: 20
Alkaline Phosphatase: 49
CO2: 24
Calcium: 9.1
Chloride: 101
GFR calc Af Amer: >60
GFR calc non Af Amer: >60
Glucose, Bld: 206 — ABNORMAL HIGH
Sodium: 136
Total Bilirubin: 1.1

## 2010-11-16 LAB — CBC
HCT: 39.5
HCT: 39.8
HCT: 42.4
Hemoglobin: 13.5
Hemoglobin: 14.3
MCV: 90.7
MCV: 90.9
Platelets: 185
Platelets: 197
Platelets: 234
RBC: 4.29
RBC: 4.33
RDW: 13.9
RDW: 13.9
WBC: 12.4 — ABNORMAL HIGH
WBC: 12.6 — ABNORMAL HIGH
WBC: 13.2 — ABNORMAL HIGH

## 2010-11-16 LAB — BASIC METABOLIC PANEL
BUN: 12
Calcium: 8.5
Calcium: 8.6
Chloride: 101
Creatinine, Ser: 0.78
GFR calc Af Amer: >60
GFR calc Af Amer: >60
GFR calc non Af Amer: >60
GFR calc non Af Amer: >60
Glucose, Bld: 231 — ABNORMAL HIGH
Potassium: 3.8
Sodium: 136

## 2010-11-16 LAB — PROTIME-INR
INR: 1.1
INR: 1.2
Prothrombin Time: 13.4
Prothrombin Time: 14.3
Prothrombin Time: 16.3 — ABNORMAL HIGH

## 2010-11-16 LAB — DIFFERENTIAL
Basophils Absolute: 0.1
Eosinophils Absolute: 0
Eosinophils Relative: 0
Lymphocytes Relative: 15
Lymphs Abs: 2.4
Monocytes Absolute: 1.2 — ABNORMAL HIGH

## 2015-04-20 ENCOUNTER — Emergency Department (HOSPITAL_COMMUNITY): Payer: Medicare Other

## 2015-04-20 ENCOUNTER — Observation Stay (HOSPITAL_BASED_OUTPATIENT_CLINIC_OR_DEPARTMENT_OTHER): Payer: Medicare Other

## 2015-04-20 ENCOUNTER — Inpatient Hospital Stay (HOSPITAL_COMMUNITY)
Admission: EM | Admit: 2015-04-20 | Discharge: 2015-04-25 | DRG: 419 | Disposition: A | Discharge status: Home / Self Care | Payer: Medicare Other | Attending: Internal Medicine | Admitting: Internal Medicine

## 2015-04-20 ENCOUNTER — Encounter (HOSPITAL_COMMUNITY): Payer: Self-pay | Admitting: *Deleted

## 2015-04-20 DIAGNOSIS — K838 Other specified diseases of biliary tract: Secondary | ICD-10-CM

## 2015-04-20 DIAGNOSIS — Z96641 Presence of right artificial hip joint: Secondary | ICD-10-CM | POA: Diagnosis not present

## 2015-04-20 DIAGNOSIS — R1013 Epigastric pain: Secondary | ICD-10-CM

## 2015-04-20 DIAGNOSIS — R109 Unspecified abdominal pain: Secondary | ICD-10-CM

## 2015-04-20 DIAGNOSIS — N400 Enlarged prostate without lower urinary tract symptoms: Secondary | ICD-10-CM

## 2015-04-20 DIAGNOSIS — R079 Chest pain, unspecified: Secondary | ICD-10-CM | POA: Diagnosis not present

## 2015-04-20 DIAGNOSIS — R112 Nausea with vomiting, unspecified: Secondary | ICD-10-CM

## 2015-04-20 DIAGNOSIS — K8000 Calculus of gallbladder with acute cholecystitis without obstruction: Principal | ICD-10-CM | POA: Diagnosis present

## 2015-04-20 DIAGNOSIS — I10 Essential (primary) hypertension: Secondary | ICD-10-CM | POA: Diagnosis not present

## 2015-04-20 DIAGNOSIS — E11649 Type 2 diabetes mellitus with hypoglycemia without coma: Secondary | ICD-10-CM | POA: Diagnosis not present

## 2015-04-20 DIAGNOSIS — Z96653 Presence of artificial knee joint, bilateral: Secondary | ICD-10-CM | POA: Diagnosis present

## 2015-04-20 DIAGNOSIS — Z91013 Allergy to seafood: Secondary | ICD-10-CM

## 2015-04-20 DIAGNOSIS — K9189 Other postprocedural complications and disorders of digestive system: Secondary | ICD-10-CM

## 2015-04-20 DIAGNOSIS — E876 Hypokalemia: Secondary | ICD-10-CM | POA: Diagnosis not present

## 2015-04-20 DIAGNOSIS — R0789 Other chest pain: Secondary | ICD-10-CM | POA: Diagnosis not present

## 2015-04-20 DIAGNOSIS — K810 Acute cholecystitis: Secondary | ICD-10-CM | POA: Diagnosis present

## 2015-04-20 DIAGNOSIS — J45909 Unspecified asthma, uncomplicated: Secondary | ICD-10-CM | POA: Diagnosis present

## 2015-04-20 DIAGNOSIS — Z79899 Other long term (current) drug therapy: Secondary | ICD-10-CM

## 2015-04-20 DIAGNOSIS — E119 Type 2 diabetes mellitus without complications: Secondary | ICD-10-CM

## 2015-04-20 DIAGNOSIS — E118 Type 2 diabetes mellitus with unspecified complications: Secondary | ICD-10-CM | POA: Diagnosis not present

## 2015-04-20 DIAGNOSIS — K8010 Calculus of gallbladder with chronic cholecystitis without obstruction: Secondary | ICD-10-CM

## 2015-04-20 DIAGNOSIS — Z794 Long term (current) use of insulin: Secondary | ICD-10-CM

## 2015-04-20 DIAGNOSIS — K449 Diaphragmatic hernia without obstruction or gangrene: Secondary | ICD-10-CM | POA: Diagnosis present

## 2015-04-20 DIAGNOSIS — E785 Hyperlipidemia, unspecified: Secondary | ICD-10-CM

## 2015-04-20 DIAGNOSIS — Z7984 Long term (current) use of oral hypoglycemic drugs: Secondary | ICD-10-CM

## 2015-04-20 DIAGNOSIS — R7989 Other specified abnormal findings of blood chemistry: Secondary | ICD-10-CM | POA: Diagnosis present

## 2015-04-20 DIAGNOSIS — Z7982 Long term (current) use of aspirin: Secondary | ICD-10-CM

## 2015-04-20 HISTORY — DX: Unspecified asthma, uncomplicated: J45.909

## 2015-04-20 LAB — CBC
HEMATOCRIT: 40.7 % (ref 39.0–52.0)
HEMATOCRIT: 39.6 % (ref 39.0–52.0)
HEMOGLOBIN: 14 g/dL (ref 13.0–17.0)
HEMOGLOBIN: 13.8 g/dL (ref 13.0–17.0)
MCH: 31 pg (ref 26.0–34.0)
MCH: 31.7 pg (ref 26.0–34.0)
MCHC: 34.4 g/dL (ref 30.0–36.0)
MCHC: 34.8 g/dL (ref 30.0–36.0)
MCV: 90 fL (ref 78.0–100.0)
MCV: 90.8 fL (ref 78.0–100.0)
Platelets: 238 10*3/uL (ref 150–400)
Platelets: 212 10*3/uL (ref 150–400)
RBC: 4.52 MIL/uL (ref 4.22–5.81)
RBC: 4.36 MIL/uL (ref 4.22–5.81)
RDW: 13.9 % (ref 11.5–15.5)
RDW: 13.8 % (ref 11.5–15.5)
WBC: 12.7 10*3/uL — ABNORMAL HIGH (ref 4.0–10.5)
WBC: 18.1 10*3/uL — ABNORMAL HIGH (ref 4.0–10.5)

## 2015-04-20 LAB — COMPREHENSIVE METABOLIC PANEL
ALT: 26 U/L (ref 17–63)
AST: 25 U/L (ref 15–41)
Albumin: 3.8 g/dL (ref 3.5–5.0)
Alkaline Phosphatase: 49 U/L (ref 38–126)
Anion gap: 12 (ref 5–15)
BILIRUBIN TOTAL: 0.7 mg/dL (ref 0.3–1.2)
BUN: 15 mg/dL (ref 6–20)
CO2: 24 mmol/L (ref 22–32)
CREATININE: 0.81 mg/dL (ref 0.61–1.24)
Calcium: 9.1 mg/dL (ref 8.9–10.3)
Chloride: 102 mmol/L (ref 101–111)
Glucose, Bld: 181 mg/dL — ABNORMAL HIGH (ref 65–99)
Potassium: 3.7 mmol/L (ref 3.5–5.1)
Sodium: 138 mmol/L (ref 135–145)
TOTAL PROTEIN: 8.1 g/dL (ref 6.5–8.1)

## 2015-04-20 LAB — CBG MONITORING, ED
Glucose-Capillary: 140 mg/dL — ABNORMAL HIGH (ref 65–99)
Glucose-Capillary: 77 mg/dL (ref 65–99)

## 2015-04-20 LAB — I-STAT CHEM 8, ED
BUN: 20 mg/dL (ref 6–20)
CALCIUM ION: 0.96 mmol/L — AB (ref 1.13–1.30)
Chloride: 104 mmol/L (ref 101–111)
Creatinine, Ser: 0.7 mg/dL (ref 0.61–1.24)
Glucose, Bld: 180 mg/dL — ABNORMAL HIGH (ref 65–99)
HEMATOCRIT: 46 % (ref 39.0–52.0)
Hemoglobin: 15.6 g/dL (ref 13.0–17.0)
Potassium: 4.1 mmol/L (ref 3.5–5.1)
SODIUM: 139 mmol/L (ref 135–145)
TCO2: 24 mmol/L (ref 0–100)

## 2015-04-20 LAB — I-STAT TROPONIN, ED: TROPONIN I, POC: 0.01 ng/mL (ref 0.00–0.08)

## 2015-04-20 LAB — LIPASE, BLOOD: Lipase: 31 U/L (ref 11–51)

## 2015-04-20 LAB — TROPONIN I
TROPONIN I: 0.04 ng/mL — AB (ref <0.031)
Troponin I: <0.03 ng/mL (ref <0.031)

## 2015-04-20 LAB — GLUCOSE, CAPILLARY: GLUCOSE-CAPILLARY: 96 mg/dL (ref 65–99)

## 2015-04-20 MED ORDER — DIPHENHYDRAMINE HCL 50 MG/ML IJ SOLN
25.0000 mg | Freq: Once | INTRAMUSCULAR | Status: AC
  Administered 2015-04-20: 25 mg via INTRAVENOUS
  Filled 2015-04-20: qty 1

## 2015-04-20 MED ORDER — INSULIN ASPART 100 UNIT/ML ~~LOC~~ SOLN
0.0000 [IU] | Freq: Three times a day (TID) | SUBCUTANEOUS | Status: DC
  Administered 2015-04-20: 1 [IU] via SUBCUTANEOUS
  Administered 2015-04-22: 2 [IU] via SUBCUTANEOUS
  Administered 2015-04-24: 1 [IU] via SUBCUTANEOUS
  Administered 2015-04-24: 2 [IU] via SUBCUTANEOUS
  Administered 2015-04-25: 1 [IU] via SUBCUTANEOUS
  Filled 2015-04-20: qty 1

## 2015-04-20 MED ORDER — ONDANSETRON HCL 4 MG/2ML IJ SOLN
4.0000 mg | Freq: Once | INTRAMUSCULAR | Status: AC
  Administered 2015-04-20: 4 mg via INTRAVENOUS
  Filled 2015-04-20: qty 2

## 2015-04-20 MED ORDER — PANTOPRAZOLE SODIUM 40 MG IV SOLR
40.0000 mg | Freq: Once | INTRAVENOUS | Status: AC
  Administered 2015-04-20: 40 mg via INTRAVENOUS
  Filled 2015-04-20: qty 40

## 2015-04-20 MED ORDER — SODIUM CHLORIDE 0.9 % IV SOLN
INTRAVENOUS | Status: DC
  Administered 2015-04-20: 50 mL/h via INTRAVENOUS

## 2015-04-20 MED ORDER — ONDANSETRON HCL 4 MG/2ML IJ SOLN: 4.0000 mg | Freq: Four times a day (QID) | INTRAMUSCULAR | Status: DC | PRN

## 2015-04-20 MED ORDER — SODIUM CHLORIDE 0.9 % IV BOLUS (SEPSIS)
1000.0000 mL | Freq: Once | INTRAVENOUS | Status: AC
  Administered 2015-04-20: 1000 mL via INTRAVENOUS

## 2015-04-20 MED ORDER — TAMSULOSIN HCL 0.4 MG PO CAPS
0.4000 mg | ORAL_CAPSULE | Freq: Two times a day (BID) | ORAL | Status: DC
  Administered 2015-04-20 – 2015-04-25 (×10): 0.4 mg via ORAL
  Filled 2015-04-20 (×11): qty 1

## 2015-04-20 MED ORDER — INSULIN GLARGINE 100 UNIT/ML ~~LOC~~ SOLN
58.0000 [IU] | Freq: Every day | SUBCUTANEOUS | Status: DC
  Administered 2015-04-20 – 2015-04-21 (×2): 58 [IU] via SUBCUTANEOUS
  Filled 2015-04-20 (×3): qty 0.58

## 2015-04-20 MED ORDER — SIMVASTATIN 20 MG PO TABS
20.0000 mg | ORAL_TABLET | Freq: Every day | ORAL | Status: DC
  Administered 2015-04-21 – 2015-04-24 (×4): 20 mg via ORAL
  Filled 2015-04-20 (×5): qty 1

## 2015-04-20 MED ORDER — ASPIRIN 81 MG PO CHEW
81.0000 mg | CHEWABLE_TABLET | Freq: Every day | ORAL | Status: DC
  Administered 2015-04-21 – 2015-04-25 (×5): 81 mg via ORAL
  Filled 2015-04-20 (×5): qty 1

## 2015-04-20 MED ORDER — NITROGLYCERIN 2 % TD OINT
1.0000 [in_us] | TOPICAL_OINTMENT | Freq: Once | TRANSDERMAL | Status: AC
  Administered 2015-04-20: 1 [in_us] via TOPICAL
  Filled 2015-04-20: qty 1

## 2015-04-20 MED ORDER — GI COCKTAIL ~~LOC~~: 30.0000 mL | Freq: Four times a day (QID) | ORAL | Status: DC | PRN

## 2015-04-20 MED ORDER — LISINOPRIL 10 MG PO TABS
10.0000 mg | ORAL_TABLET | Freq: Every day | ORAL | Status: DC
  Administered 2015-04-21 – 2015-04-25 (×4): 10 mg via ORAL
  Filled 2015-04-20 (×6): qty 1

## 2015-04-20 MED ORDER — GI COCKTAIL ~~LOC~~
30.0000 mL | Freq: Once | ORAL | Status: AC
  Administered 2015-04-20: 30 mL via ORAL
  Filled 2015-04-20: qty 30

## 2015-04-20 MED ORDER — IOHEXOL 350 MG/ML SOLN
100.0000 mL | Freq: Once | INTRAVENOUS | Status: AC | PRN
  Administered 2015-04-20: 100 mL via INTRAVENOUS

## 2015-04-20 MED ORDER — MORPHINE SULFATE (PF) 2 MG/ML IV SOLN
2.0000 mg | INTRAVENOUS | Status: DC | PRN
  Administered 2015-04-20 – 2015-04-21 (×2): 2 mg via INTRAVENOUS
  Filled 2015-04-20 (×2): qty 1

## 2015-04-20 MED ORDER — MORPHINE SULFATE (PF) 4 MG/ML IV SOLN
4.0000 mg | Freq: Once | INTRAVENOUS | Status: AC
  Administered 2015-04-20: 4 mg via INTRAVENOUS
  Filled 2015-04-20: qty 1

## 2015-04-20 MED ORDER — ASPIRIN 81 MG PO CHEW
324.0000 mg | CHEWABLE_TABLET | Freq: Once | ORAL | Status: AC
  Administered 2015-04-20: 324 mg via ORAL
  Filled 2015-04-20: qty 4

## 2015-04-20 MED ORDER — ACETAMINOPHEN 325 MG PO TABS
650.0000 mg | ORAL_TABLET | ORAL | Status: DC | PRN
  Administered 2015-04-22: 650 mg via ORAL
  Filled 2015-04-20: qty 2

## 2015-04-20 MED ORDER — ENOXAPARIN SODIUM 40 MG/0.4ML ~~LOC~~ SOLN
40.0000 mg | SUBCUTANEOUS | Status: DC
  Administered 2015-04-20 – 2015-04-22 (×3): 40 mg via SUBCUTANEOUS
  Filled 2015-04-20 (×3): qty 0.4

## 2015-04-20 NOTE — ED Notes (Signed)
2nd report attempted and to check on status of bed in the room.

## 2015-04-20 NOTE — H&P (Signed)
[facility] History and Physical  Jeff Rodriguez [id number] DOB: 03-24-1945 DOA: 04/20/2015  PCP: No primary care provider on file.   Chief Complaint: Chest Pain  HPI: Jeff Rodriguez is a 70 y.o. male  HTN, hyperlipidemia, DM presenting with acute substernal chest pain since 1 am described as 9/10, sharp, constant,and stabbing in nature radiating to the back. Pain not worsened with deep inspiration, movement or exertion. Patient took 1 dose of ASA 325 mg.Denies any jaw pain. Denies any dizziness or falls. Denies shortness of breath or cough. Denies any fever or chills.Reported nausea, and non-bloody emesis x4 since symptoms began. He reports some mid epigastric pain after his last meal yesterday. Appetite is normal and eats salt rich foods. No diarrhea.Denies any leg swelling or calf pain. Denies any headaches or vision changes. She denies any seizures or confusion. Denies any prior history of cardiac disease. The patient never had an echocardiogram or seen by Cardiology. No recent long distance trips.   At the ED, patient received Nitroglycerin without significant relief, currently at 7/10 and was later placed on the patch. EKG shows NSR with borderline TW abnormalities, QTC 428. CMET and CBC are essentially normal-patient has a history of chronic idiopathic leukocytosis, currently at 12.7.Troponins are negative to date, 0.01. CT angio negative for PE or aortic dissection. There is LVH. Of note, incidental cholelithiasis and focal hiatal hernia seen.    Review of Systems  Constitutional: Negative.   HENT: Negative.   Eyes: Negative.   Respiratory: Negative.   Cardiovascular: Positive for chest pain. Negative for palpitations, orthopnea, claudication, leg swelling and PND.  Gastrointestinal: Positive for heartburn, nausea, vomiting and abdominal pain. Negative for diarrhea, constipation, blood in stool and melena.  Genitourinary: Positive for frequency. Negative for flank pain.         Has BPH  Musculoskeletal: Negative.   Skin: Negative.   Neurological: Negative.   Endo/Heme/Allergies: Negative.   Psychiatric/Behavioral: Negative.     Past Medical History  Diagnosis Date  . Diabetes mellitus   . Hypertension   . Asthma    Past Surgical History  Procedure Laterality Date  . Replacement total knee Bilateral   . Total hip arthroplasty Right    Social History:  reports that he has never smoked. He does not have any smokeless tobacco history on file. He reports that he does not drink alcohol or use illicit drugs.  Allergies  Allergen Reactions  . Shrimp [Shellfish Allergy] Anaphylaxis    Family History  Problem Relation Age of Onset  . Cancer - Lung Father   . Deep vein thrombosis Mother      Prior to Admission medications   Medication Sig Start Date End Date Taking? Authorizing Provider  aspirin 81 MG chewable tablet Chew 81 mg by mouth daily.   Yes Historical Provider, MD  insulin glargine (LANTUS) 100 UNIT/ML injection Inject 58 Units into the skin at bedtime.   Yes Historical Provider, MD  lisinopril (PRINIVIL,ZESTRIL) 20 MG tablet Take 10 mg by mouth daily.   Yes Historical Provider, MD  metFORMIN (GLUCOPHAGE) 500 MG tablet Take 500 mg by mouth 2 (two) times daily with a meal.   Yes Historical Provider, MD  simvastatin (ZOCOR) 40 MG tablet Take 20 mg by mouth daily.   Yes Historical Provider, MD  tamsulosin (FLOMAX) 0.4 MG CAPS capsule Take 0.4 mg by mouth 2 (two) times daily.   Yes Historical Provider, MD   Physical Exam: Filed Vitals:   04/20/15 0515 04/20/15 0530  04/20/15 0545 04/20/15 0826  BP:  177/85 107/80 145/73  Pulse:  70 65 84  Temp:      TempSrc:      Resp:  17 17 18   SpO2: 99% 100% 100% 96%    Wt Readings from Last 3 Encounters:  No data found for Wt    Physical Exam  Constitutional: He is well-developed, well-nourished, and in no distress. No distress.  Somewhat lethargic after Benadryl dose  HENT:  Head:  Normocephalic and atraumatic.  Mouth/Throat: No oropharyngeal exudate.  Eyes: Pupils are equal, round, and reactive to light. Scleral icterus is present.  Neck: Normal range of motion. Neck supple. No JVD present. No tracheal deviation present. No thyromegaly present.  Cardiovascular: Normal rate and regular rhythm.  Exam reveals no gallop and no friction rub.   No murmur heard. Pulmonary/Chest: Effort normal and breath sounds normal.  Abdominal: Soft. Bowel sounds are normal. He exhibits no mass. There is tenderness. There is no rebound and no guarding.  Tender at epigastrium  Musculoskeletal: Normal range of motion. He exhibits tenderness. He exhibits no edema.  Mild tenderness to palpation at the subxyphoid area  Lymphadenopathy:    He has no cervical adenopathy.  Neurological: He displays normal reflexes. He exhibits normal muscle tone.  Skin: Skin is warm and dry. No rash noted. He is not diaphoretic. No erythema. No pallor.  Psychiatric: Mood, memory, affect and judgment normal.            Labs on Admission:  Basic Metabolic Panel:  Recent Labs Lab 04/20/15 0523 04/20/15 0538  NA 138 139  K 3.7 4.1  CL 102 104  CO2 24  --   GLUCOSE 181* 180*  BUN 15 20  CREATININE 0.81 0.70  CALCIUM 9.1  --     Liver Function Tests:  Recent Labs Lab 04/20/15 0523  AST 25  ALT 26  ALKPHOS 49  BILITOT 0.7  PROT 8.1  ALBUMIN 3.8    Recent Labs Lab 04/20/15 0523  LIPASE 31   No results for input(s): AMMONIA in the last 168 hours.  CBC:  Recent Labs Lab 04/20/15 0523 04/20/15 0538  WBC 12.7*  --   HGB 14.0 15.6  HCT 40.7 46.0  MCV 90.0  --   PLT 238  --     CBG: No results for input(s): GLUCAP in the last 168 hours.  Radiological Exams on Admission: Dg Chest Portable 1 View  04/20/2015  CLINICAL DATA:  Acute onset of generalized chest pressure. Initial encounter. EXAM: PORTABLE CHEST 1 VIEW COMPARISON:  Chest radiograph performed 06/08/2007 FINDINGS: The  lungs are well-aerated and clear. There is no evidence of focal opacification, pleural effusion or pneumothorax. The cardiomediastinal silhouette is within normal limits. No acute osseous abnormalities are seen. IMPRESSION: No acute cardiopulmonary process seen. Electronically Signed   By: Garald Balding M.D.   On: 04/20/2015 05:38   Ct Angio Chest Aorta W/cm &/or Wo/cm  04/20/2015  CLINICAL DATA:  Chest pain radiating to back EXAM: CT ANGIOGRAPHY CHEST WITH CONTRAST TECHNIQUE: Initially, axial CT images were obtained through the chest without intravenous contrast material administration. Multidetector CT imaging of the chest was performed using the standard protocol during bolus administration of intravenous contrast. Multiplanar CT image reconstructions and MIPs were obtained to evaluate the vascular anatomy. CONTRAST:  110mL OMNIPAQUE IOHEXOL 350 MG/ML SOLN COMPARISON:  Chest radiograph April 20, 2015 ; CT abdomen and pelvis including lung bases June 04, 2010 FINDINGS: Mediastinum/Lymph Nodes: There is  no appreciable thoracic aortic aneurysm or thoracic aortic dissection. The ascending thoracic aortic diameter is 3.8 x 3.7 cm. The visualized great vessels appear normal. Note that the right and left common carotid arteries arise as a common trunk, an anatomic variant. There is no demonstrable pulmonary embolus. The pericardium is not thickened. There are scattered foci of coronary artery calcification. There is left ventricular hypertrophy. Visualized thyroid appears unremarkable. There is no thoracic aortic aneurysm. There is a focal hiatal hernia. Lungs/Pleura: There is no parenchymal lung edema or consolidation. Slight bibasilar atelectasis is present, probably dependent in etiology. Upper abdomen: In the visualized upper abdomen, there is cholelithiasis. There is a degree of hepatic steatosis. The visualized upper abdominal structures otherwise appear unremarkable. Musculoskeletal: There are no blastic  or lytic bone lesions. No intramuscular lesions are identified. Review of the MIP images confirms the above findings. IMPRESSION: No demonstrable thoracic aortic aneurysm or dissection. No pulmonary embolus evident. There are several foci of coronary artery calcification. There is left ventricular hypertrophy. The pericardium is not appreciably thickened. No parenchymal lung edema or consolidation. Rather minimal bibasilar atelectasis, like dependent in etiology. No appreciable thoracic adenopathy. Cholelithiasis. Focal hiatal hernia present. Electronically Signed   By: Lowella Grip III M.D.   On: 04/20/2015 07:37    EKG:EKG: NSR, without ST elevation or depression, QTC 409    Assessment/Plan Active Problems:   Chest pain   Chest wall pain   DM2 (diabetes mellitus, type 2) (HCC)   Hyperlipidemia   Epigastric pain   Nausea and vomiting   BPH (benign prostatic hyperplasia)  Chest pain with chest wall pain  HEART score 2 Troponin normal , EKG without evidence of ACS. CP unrelieved by nitroglycerin, morphine, aspirin. CT angio without PE or aortic dissection, LVH noted. Distal pulses intact. Favoring musculoskeletal etiology such as costochondritis as subxyphoid pain is reproducible.  - Admit to Telemetry/ Observation Cycle troponin EKG in am -morphine, nitroglycerin continue  ASA, O2 and NTG as needed Statins  GI cocktail 2 D Echo  Abd pain with nausea and vomiting: may have a viral GE component and hiatal hernia. Lipase normal at 31. Never had EGD. CT Angio confirms hiatal hernia with cholelithiasis  Continue antiemetics as needed Recommend GI evaluation as Outpatient  Hypertension BP 145/73 mmHg  Pulse 84   Continue home anti-hypertensive medications with Prinivil  Add Hydralazine Q6 hours as needed for SBP >160 and /or DBP >110.   Hyperlipidemia Continue home Zocor  Type II Diabetes Check Hb A1C Will hold home oral diabetic medications.  Continue Lantus 58 a day,  SSI Place on heart healthy carb modified diet.  Idiopathic Leukocytosis This is chronic, followed at the [place], currently at 12.7 repeat CBC in am  BPH, followed at the [facility] Continue home Flomax   Code Status: Full Code   DVT Prophylaxis: Lovenox Family Communication: at bedside Disposition Plan: Pending Improvement. Admitted for observation in tele bed. Expected LOS 24-48 hrs    [facility] E,PA-C [facility] [email or web address] Password [email or web address]

## 2015-04-20 NOTE — ED Notes (Signed)
Cp and vomiting woke pt this am. Actively vomiting. Skin w/d. ECG completed and given to Dr Dina Rich

## 2015-04-20 NOTE — ED Notes (Signed)
Pt from home with Chest Pain. Pt awaked at 1am with a pressure in his central chest. Pt started vomiting around 2am and has about 3x.

## 2015-04-20 NOTE — ED Notes (Signed)
Echo at bedside

## 2015-04-20 NOTE — ED Notes (Signed)
Patient transported to CT 

## 2015-04-20 NOTE — Progress Notes (Signed)
*  PRELIMINARY RESULTS* Echocardiogram 2D Echocardiogram has been performed.  Leavy Cella 04/20/2015, 10:07 AM

## 2015-04-20 NOTE — ED Notes (Signed)
Diet tray order

## 2015-04-20 NOTE — ED Notes (Signed)
Meal tray ordered 

## 2015-04-20 NOTE — ED Notes (Signed)
Report attempted 

## 2015-04-20 NOTE — ED Provider Notes (Signed)
CSN: [id number]     Arrival date & time 04/20/15  0454 History   First MD Initiated Contact with Patient 04/20/15 [phone]     Chief Complaint  Patient presents with  . Chest Pain  . Emesis     (Consider location/radiation/quality/duration/timing/severity/associated sxs/prior Treatment) HPI  This is a 70 year old male with a history of diabetes and hypertension who presents with chest pain. Patient reports acute onset of chest pain at 1 AM this morning. It is pressure-like and sharp in the center of his chest, it radiates to his back.  There is not an exertional component. Currently his pain as 9 out of 10. He has also had multiple episodes of nonbilious, nonbloody emesis. No history of coronary artery disease. Denies diarrhea or abdominal pain. States he had one episode of pain similar last week that resolved after Tums. Tums did not help tonight.  Past Medical History  Diagnosis Date  . Diabetes mellitus   . Hypertension   . Asthma    Past Surgical History  Procedure Laterality Date  . Replacement total knee Bilateral   . Total hip arthroplasty Right    History reviewed. No pertinent family history. Social History  Substance Use Topics  . Smoking status: Never Smoker   . Smokeless tobacco: None  . Alcohol Use: No    Review of Systems  Constitutional: Negative.  Negative for fever.  Respiratory: Positive for chest tightness. Negative for cough and shortness of breath.   Cardiovascular: Positive for chest pain. Negative for leg swelling.  Gastrointestinal: Positive for nausea and vomiting. Negative for abdominal pain.  Genitourinary: Negative.  Negative for dysuria.  Skin: Negative for rash.  Neurological: Negative for weakness, numbness and headaches.  All other systems reviewed and are negative.     Allergies  Shrimp  Home Medications   Prior to Admission medications   Medication Sig Start Date End Date Taking? Authorizing Provider  aspirin 81 MG chewable tablet  Chew 81 mg by mouth daily.   Yes Historical Provider, MD  insulin glargine (LANTUS) 100 UNIT/ML injection Inject 58 Units into the skin at bedtime.   Yes Historical Provider, MD  lisinopril (PRINIVIL,ZESTRIL) 20 MG tablet Take 10 mg by mouth daily.   Yes Historical Provider, MD  metFORMIN (GLUCOPHAGE) 500 MG tablet Take 500 mg by mouth 2 (two) times daily with a meal.   Yes Historical Provider, MD  simvastatin (ZOCOR) 40 MG tablet Take 20 mg by mouth daily.   Yes Historical Provider, MD  tamsulosin (FLOMAX) 0.4 MG CAPS capsule Take 0.4 mg by mouth 2 (two) times daily.   Yes Historical Provider, MD   BP 107/80 mmHg  Pulse 65  Temp(Src) 98.3 F (36.8 C) (Oral)  Resp 17  SpO2 100% Physical Exam  Constitutional: He is oriented to person, place, and time.  Very uncomfortable appearing  HENT:  Head: Normocephalic and atraumatic.  Eyes: Pupils are equal, round, and reactive to light.  Cardiovascular: Normal rate, regular rhythm and normal heart sounds.   No murmur heard. Pulmonary/Chest: Effort normal and breath sounds normal. No respiratory distress. He has no wheezes. He exhibits no tenderness.  Abdominal: Soft. Bowel sounds are normal. There is no tenderness. There is no rebound and no guarding.  Musculoskeletal: He exhibits no edema.  Neurological: He is alert and oriented to person, place, and time.  Skin: Skin is warm and dry.  Psychiatric: He has a normal mood and affect.  Nursing note and vitals reviewed.   ED Course  Procedures (including critical care time) Labs Review Labs Reviewed  CBC - Abnormal; Notable for the following:    WBC 12.7 (*)    All other components within normal limits  COMPREHENSIVE METABOLIC PANEL - Abnormal; Notable for the following:    Glucose, Bld 181 (*)    All other components within normal limits  I-STAT CHEM 8, ED - Abnormal; Notable for the following:    Glucose, Bld 180 (*)    Calcium, Ion 0.96 (*)    All other components within normal limits   LIPASE, BLOOD  I-STAT TROPOININ, ED    Imaging Review Dg Chest Portable 1 View  04/20/2015  CLINICAL DATA:  Acute onset of generalized chest pressure. Initial encounter. EXAM: PORTABLE CHEST 1 VIEW COMPARISON:  Chest radiograph performed 06/08/2007 FINDINGS: The lungs are well-aerated and clear. There is no evidence of focal opacification, pleural effusion or pneumothorax. The cardiomediastinal silhouette is within normal limits. No acute osseous abnormalities are seen. IMPRESSION: No acute cardiopulmonary process seen. Electronically Signed   By: Garald Balding M.D.   On: 04/20/2015 05:38   Ct Angio Chest Aorta W/cm &/or Wo/cm  04/20/2015  CLINICAL DATA:  Chest pain radiating to back EXAM: CT ANGIOGRAPHY CHEST WITH CONTRAST TECHNIQUE: Initially, axial CT images were obtained through the chest without intravenous contrast material administration. Multidetector CT imaging of the chest was performed using the standard protocol during bolus administration of intravenous contrast. Multiplanar CT image reconstructions and MIPs were obtained to evaluate the vascular anatomy. CONTRAST:  149mL OMNIPAQUE IOHEXOL 350 MG/ML SOLN COMPARISON:  Chest radiograph April 20, 2015 ; CT abdomen and pelvis including lung bases June 04, 2010 FINDINGS: Mediastinum/Lymph Nodes: There is no appreciable thoracic aortic aneurysm or thoracic aortic dissection. The ascending thoracic aortic diameter is 3.8 x 3.7 cm. The visualized great vessels appear normal. Note that the right and left common carotid arteries arise as a common trunk, an anatomic variant. There is no demonstrable pulmonary embolus. The pericardium is not thickened. There are scattered foci of coronary artery calcification. There is left ventricular hypertrophy. Visualized thyroid appears unremarkable. There is no thoracic aortic aneurysm. There is a focal hiatal hernia. Lungs/Pleura: There is no parenchymal lung edema or consolidation. Slight bibasilar  atelectasis is present, probably dependent in etiology. Upper abdomen: In the visualized upper abdomen, there is cholelithiasis. There is a degree of hepatic steatosis. The visualized upper abdominal structures otherwise appear unremarkable. Musculoskeletal: There are no blastic or lytic bone lesions. No intramuscular lesions are identified. Review of the MIP images confirms the above findings. IMPRESSION: No demonstrable thoracic aortic aneurysm or dissection. No pulmonary embolus evident. There are several foci of coronary artery calcification. There is left ventricular hypertrophy. The pericardium is not appreciably thickened. No parenchymal lung edema or consolidation. Rather minimal bibasilar atelectasis, like dependent in etiology. No appreciable thoracic adenopathy. Cholelithiasis. Focal hiatal hernia present. Electronically Signed   By: Lowella Grip III M.D.   On: 04/20/2015 07:37   I have personally reviewed and evaluated these images and lab results as part of my medical decision-making.   EKG Interpretation   Date/Time:  Monday April 20 2015 05:01:55 EST Ventricular Rate:  67 PR Interval:  132 QRS Duration: 75 QT Interval:  424 QTC Calculation: 448 R Axis:   66 Text Interpretation:  Sinus rhythm Confirmed by Dina Rich  MD, Adaira Centola  ([id number]) on 04/20/2015 7:05:32 AM      EKG Interpretation  Date/Time:  Monday April 20 2015 05:01:55 EST Ventricular Rate:  67  PR Interval:  132 QRS Duration: 75 QT Interval:  424 QTC Calculation: 448 R Axis:   66 Text Interpretation:  Sinus rhythm Confirmed by Jacon Whetzel  MD, Suzanne Kho ([id number]) on 04/20/2015 7:05:32 AM        MDM   Final diagnoses:  Other chest pain    Patient presents with chest pain. Onset this morning. Pressure and sharp in nature. He is very uncomfortable appearing on initial exam. Noted to be hypertensive. No reproducible abdominal or chest tenderness. He has had multiple episodes of non-bilious, nonbloody emesis.  Patient was given fluids, Zofran, and morphine. Given how uncomfortable with patient appears and a relative acute onset of symptoms, aortic dissection would be a consideration. ACS is also consideration as patient has risk factors including age, hypertension, diabetes, and obesity. EKG shows normal sinus rhythm without ischemic changes. Chest x-ray is negative. CT angio chest ordered to rule out dissection. Patient without improvement of pain with morphine.  CT angio chest negative for dissection. It does show evidence of scattered coronary artery calcifications. Patient's heart score is 4.  Patient previously given a GI cocktail and Protonix. This too did not improve his pain.  Nitroglycerin ointment applied and patient given full dose aspirin. Given coronary artery calcifications and heart score of 4, feel patient needs admission for formal chest pain rule out. Current pain is 6 out of 10.  Discussed with Dr. Marily Memos who will evaluate for admission.  Merryl Hacker, MD 04/20/15 [phone]

## 2015-04-21 ENCOUNTER — Observation Stay (HOSPITAL_COMMUNITY): Payer: Medicare Other

## 2015-04-21 ENCOUNTER — Observation Stay (HOSPITAL_BASED_OUTPATIENT_CLINIC_OR_DEPARTMENT_OTHER): Payer: Medicare Other

## 2015-04-21 DIAGNOSIS — Z794 Long term (current) use of insulin: Secondary | ICD-10-CM

## 2015-04-21 DIAGNOSIS — N400 Enlarged prostate without lower urinary tract symptoms: Secondary | ICD-10-CM | POA: Diagnosis not present

## 2015-04-21 DIAGNOSIS — E785 Hyperlipidemia, unspecified: Secondary | ICD-10-CM | POA: Diagnosis not present

## 2015-04-21 DIAGNOSIS — R079 Chest pain, unspecified: Secondary | ICD-10-CM | POA: Diagnosis not present

## 2015-04-21 DIAGNOSIS — E11649 Type 2 diabetes mellitus with hypoglycemia without coma: Secondary | ICD-10-CM | POA: Diagnosis not present

## 2015-04-21 DIAGNOSIS — R072 Precordial pain: Secondary | ICD-10-CM

## 2015-04-21 DIAGNOSIS — K8000 Calculus of gallbladder with acute cholecystitis without obstruction: Secondary | ICD-10-CM | POA: Diagnosis not present

## 2015-04-21 DIAGNOSIS — I10 Essential (primary) hypertension: Secondary | ICD-10-CM | POA: Diagnosis not present

## 2015-04-21 DIAGNOSIS — R112 Nausea with vomiting, unspecified: Secondary | ICD-10-CM

## 2015-04-21 DIAGNOSIS — J45909 Unspecified asthma, uncomplicated: Secondary | ICD-10-CM | POA: Diagnosis not present

## 2015-04-21 DIAGNOSIS — E118 Type 2 diabetes mellitus with unspecified complications: Secondary | ICD-10-CM | POA: Diagnosis not present

## 2015-04-21 LAB — COMPREHENSIVE METABOLIC PANEL
ALK PHOS: 38 U/L (ref 38–126)
ALT: 22 U/L (ref 17–63)
AST: 18 U/L (ref 15–41)
Albumin: 3 g/dL — ABNORMAL LOW (ref 3.5–5.0)
Anion gap: 9 (ref 5–15)
BILIRUBIN TOTAL: 1.7 mg/dL — AB (ref 0.3–1.2)
BUN: 18 mg/dL (ref 6–20)
CALCIUM: 8.3 mg/dL — AB (ref 8.9–10.3)
CO2: 26 mmol/L (ref 22–32)
CREATININE: 0.98 mg/dL (ref 0.61–1.24)
Chloride: 106 mmol/L (ref 101–111)
GFR calc Af Amer: >60 mL/min (ref >60)
GLUCOSE: 81 mg/dL (ref 65–99)
POTASSIUM: 3.5 mmol/L (ref 3.5–5.1)
Sodium: 141 mmol/L (ref 135–145)
TOTAL PROTEIN: 6.7 g/dL (ref 6.5–8.1)

## 2015-04-21 LAB — NM MYOCAR MULTI W/SPECT W/WALL MOTION / EF
CHL CUP NUCLEAR SDS: 1
CHL CUP NUCLEAR SRS: 6
CHL CUP NUCLEAR SSS: 7
Estimated workload: 1 METS
Exercise duration (min): 5 min
Exercise duration (sec): 18 s
LV dias vol: 92 mL
LV sys vol: 36 mL
Peak HR: 107 {beats}/min
RATE: 0.26
Rest HR: 80 {beats}/min
TID: 1.39

## 2015-04-21 LAB — GLUCOSE, CAPILLARY
GLUCOSE-CAPILLARY: 87 mg/dL (ref 65–99)
GLUCOSE-CAPILLARY: 78 mg/dL (ref 65–99)

## 2015-04-21 LAB — CBC WITH DIFFERENTIAL/PLATELET
BASOS ABS: 0 10*3/uL (ref 0.0–0.1)
Basophils Relative: 0 %
Eosinophils Absolute: 0 10*3/uL (ref 0.0–0.7)
Eosinophils Relative: 0 %
HEMATOCRIT: 35.2 % — AB (ref 39.0–52.0)
HEMOGLOBIN: 12.3 g/dL — AB (ref 13.0–17.0)
LYMPHS PCT: 12 %
Lymphs Abs: 2.7 10*3/uL (ref 0.7–4.0)
MCH: 31.8 pg (ref 26.0–34.0)
MCHC: 34.9 g/dL (ref 30.0–36.0)
MCV: 91 fL (ref 78.0–100.0)
MONO ABS: 2.3 10*3/uL — AB (ref 0.1–1.0)
Monocytes Relative: 10 %
NEUTROS ABS: 17.6 10*3/uL — AB (ref 1.7–7.7)
NEUTROS PCT: 78 %
Platelets: 191 10*3/uL (ref 150–400)
RBC: 3.87 MIL/uL — ABNORMAL LOW (ref 4.22–5.81)
RDW: 14.3 % (ref 11.5–15.5)
WBC: 22.7 10*3/uL — ABNORMAL HIGH (ref 4.0–10.5)

## 2015-04-21 LAB — HEMOGLOBIN A1C
HEMOGLOBIN A1C: 7.8 % — AB (ref 4.8–5.6)
Mean Plasma Glucose: 177 mg/dL

## 2015-04-21 MED ORDER — TECHNETIUM TC 99M SESTAMIBI GENERIC - CARDIOLITE
30.0000 | Freq: Once | INTRAVENOUS | Status: AC | PRN
  Administered 2015-04-21: 30 via INTRAVENOUS

## 2015-04-21 MED ORDER — TECHNETIUM TC 99M SESTAMIBI GENERIC - CARDIOLITE
10.0000 | Freq: Once | INTRAVENOUS | Status: AC | PRN
  Administered 2015-04-21: 10 via INTRAVENOUS

## 2015-04-21 MED ORDER — REGADENOSON 0.4 MG/5ML IV SOLN
0.4000 mg | Freq: Once | INTRAVENOUS | Status: DC
  Filled 2015-04-21: qty 5

## 2015-04-21 MED ORDER — REGADENOSON 0.4 MG/5ML IV SOLN
INTRAVENOUS | Status: AC
  Filled 2015-04-21: qty 5

## 2015-04-21 NOTE — Care Management Obs Status (Signed)
[facility] NOTIFICATION   Patient Details  Name: ZAKI PLASKY MRN: [id number] Date of Birth: 1945/07/28   Medicare Observation Status Notification Given:  Yes    Dawayne Patricia, RN 04/21/2015, 9:58 AM

## 2015-04-21 NOTE — Progress Notes (Signed)
Myoview showed:   There was no ST segment deviation noted during stress.  The study is normal.  This is a low risk study.  The left ventricular ejection fraction is normal (55-65%).  Normal resting and stress perfusion no ischemia or infarction EF 61%   No further cardiac workup. Will sign off. Call with a questions.   Mahaila Tischer, [place]

## 2015-04-21 NOTE — Progress Notes (Signed)
?   If he can walk on treadmill. Done Lexiscan Myoview . Tolerated procedure well. Result to follow. [facility] to read.   Sho Salguero, [place]

## 2015-04-21 NOTE — Progress Notes (Signed)
PATIENT DETAILS Name: Jeff Rodriguez Age: 70 y.o. Sex: male Date of Birth: [birth year] Admit Date: 04/20/2015 Admitting Physician Waldemar Dickens, MD PCP:No primary care provider on file.   Subjective: Over the chest pain of vomiting this morning.  Assessment/Plan: Active Problems: Chest pain: Suspect a GI etiology-but has multiple risk factors and coronary calcification demonstrated on CT angiogram chest. Will consult cardiology for risk stratification. Check RUQ ultrasound, if pain reoccurs-and this stress test is negative-may need a HIDA scan to rule out biliary colic. Follow  Type 2 diabetes: CBGs stable, continue with gait units of Lantus and SSI. Follow  Hypertension: Controlled, continue lisinopril. Follow  Dyslipidemia: Continue statin  BPH: Continue Flomax.  Disposition: Remain inpatient  Antimicrobial agents  See below  Anti-infectives    None      DVT Prophylaxis: Prophylactic Lovenox  Code Status: Full code   Family Communication None at bedside  Procedures: None  CONSULTS:  cardiology  Time spent 30 minutes-Greater than 50% of this time was spent in counseling, explanation of diagnosis, planning of further management, and coordination of care.  MEDICATIONS: Scheduled Meds: . aspirin  81 mg Oral Daily  . enoxaparin (LOVENOX) injection  40 mg Subcutaneous Q24H  . insulin aspart  0-9 Units Subcutaneous TID WC  . insulin glargine  58 Units Subcutaneous QHS  . lisinopril  10 mg Oral Daily  . simvastatin  20 mg Oral Daily  . tamsulosin  0.4 mg Oral BID   Continuous Infusions: . sodium chloride 50 mL/hr (04/20/15 1001)   PRN Meds:.acetaminophen, gi cocktail, morphine injection, ondansetron (ZOFRAN) IV    PHYSICAL EXAM: Vital signs in last 24 hours: Filed Vitals:   04/20/15 1915 04/20/15 1945 04/20/15 2034 04/21/15 0456  BP: 113/62 123/69 130/69 115/63  Pulse:   95 83  Temp:   98.7 F (37.1 C) 98.4 F (36.9 C)    TempSrc:   Oral Oral  Resp: 22 21 20 18   Weight:   90.9 kg (200 lb 6.4 oz)   SpO2:   97% 98%    Weight change:  Filed Weights   04/20/15 2034  Weight: 90.9 kg (200 lb 6.4 oz)   There is no height on file to calculate BMI.   Gen Exam: Awake and alert with clear speech.   Neck: Supple, No JVD.   Chest: B/L Clear.   CVS: S1 S2 Regular, no murmurs.  Abdomen: soft, BS +, Mildly tender in the upper abdomen without any rebound rigidity or guarding.Non distended.  Extremities: no edema, lower extremities warm to touch. Neurologic: Non Focal.   Skin: No Rash.   Wounds: N/A.   Intake/Output from previous day:  Intake/Output Summary (Last 24 hours) at 04/21/15 1149 Last data filed at 04/21/15 0845  Gross per 24 hour  Intake      0 ml  Output      0 ml  Net      0 ml     LAB RESULTS: CBC  Recent Labs Lab 04/20/15 0523 04/20/15 0538 04/20/15 0955 04/21/15 1018  WBC 12.7*  --  18.1* 22.7*  HGB 14.0 15.6 13.8 12.3*  HCT 40.7 46.0 39.6 35.2*  PLT 238  --  212 191  MCV 90.0  --  90.8 91.0  MCH 31.0  --  31.7 31.8  MCHC 34.4  --  34.8 34.9  RDW 13.9  --  13.8 14.3  LYMPHSABS  --   --   --  2.7  MONOABS  --   --   --  2.3*  EOSABS  --   --   --  0.0  BASOSABS  --   --   --  0.0    Chemistries   Recent Labs Lab 04/20/15 0523 04/20/15 0538 04/21/15 1018  NA 138 139 141  K 3.7 4.1 3.5  CL 102 104 106  CO2 24  --  26  GLUCOSE 181* 180* 81  BUN 15 20 18   CREATININE 0.81 0.70 0.98  CALCIUM 9.1  --  8.3*    CBG:  Recent Labs Lab 04/20/15 1326 04/20/15 1929 04/20/15 2030 04/21/15 0654  GLUCAP 140* 77 96 87    GFR CrCl cannot be calculated (Unknown ideal weight.).  Coagulation profile No results for input(s): INR, PROTIME in the last 168 hours.  Cardiac Enzymes  Recent Labs Lab 04/20/15 0955 04/20/15 1522  TROPONINI <0.03 0.04*    Invalid input(s): POCBNP No results for input(s): DDIMER in the last 72 hours.  Recent Labs  04/20/15 0955   HGBA1C 7.8*   No results for input(s): CHOL, HDL, LDLCALC, TRIG, CHOLHDL, LDLDIRECT in the last 72 hours. No results for input(s): TSH, T4TOTAL, T3FREE, THYROIDAB in the last 72 hours.  Invalid input(s): FREET3 No results for input(s): VITAMINB12, FOLATE, FERRITIN, TIBC, IRON, RETICCTPCT in the last 72 hours.  Recent Labs  04/20/15 0523  LIPASE 31    Urine Studies No results for input(s): UHGB, CRYS in the last 72 hours.  Invalid input(s): UACOL, UAPR, USPG, UPH, UTP, UGL, UKET, UBIL, UNIT, UROB, ULEU, UEPI, UWBC, URBC, UBAC, CAST, UCOM, BILUA  MICROBIOLOGY: No results found for this or any previous visit (from the past 240 hour(s)).  RADIOLOGY STUDIES/RESULTS: Dg Chest Portable 1 View  04/20/2015  CLINICAL DATA:  Acute onset of generalized chest pressure. Initial encounter. EXAM: PORTABLE CHEST 1 VIEW COMPARISON:  Chest radiograph performed 06/08/2007 FINDINGS: The lungs are well-aerated and clear. There is no evidence of focal opacification, pleural effusion or pneumothorax. The cardiomediastinal silhouette is within normal limits. No acute osseous abnormalities are seen. IMPRESSION: No acute cardiopulmonary process seen. Electronically Signed   By: Garald Balding M.D.   On: 04/20/2015 05:38   Ct Angio Chest Aorta W/cm &/or Wo/cm  04/20/2015  CLINICAL DATA:  Chest pain radiating to back EXAM: CT ANGIOGRAPHY CHEST WITH CONTRAST TECHNIQUE: Initially, axial CT images were obtained through the chest without intravenous contrast material administration. Multidetector CT imaging of the chest was performed using the standard protocol during bolus administration of intravenous contrast. Multiplanar CT image reconstructions and MIPs were obtained to evaluate the vascular anatomy. CONTRAST:  167mL OMNIPAQUE IOHEXOL 350 MG/ML SOLN COMPARISON:  Chest radiograph April 20, 2015 ; CT abdomen and pelvis including lung bases June 04, 2010 FINDINGS: Mediastinum/Lymph Nodes: There is no appreciable  thoracic aortic aneurysm or thoracic aortic dissection. The ascending thoracic aortic diameter is 3.8 x 3.7 cm. The visualized great vessels appear normal. Note that the right and left common carotid arteries arise as a common trunk, an anatomic variant. There is no demonstrable pulmonary embolus. The pericardium is not thickened. There are scattered foci of coronary artery calcification. There is left ventricular hypertrophy. Visualized thyroid appears unremarkable. There is no thoracic aortic aneurysm. There is a focal hiatal hernia. Lungs/Pleura: There is no parenchymal lung edema or consolidation. Slight bibasilar atelectasis is present, probably dependent in etiology. Upper abdomen: In the visualized upper abdomen, there is cholelithiasis. There is a degree of hepatic steatosis.  The visualized upper abdominal structures otherwise appear unremarkable. Musculoskeletal: There are no blastic or lytic bone lesions. No intramuscular lesions are identified. Review of the MIP images confirms the above findings. IMPRESSION: No demonstrable thoracic aortic aneurysm or dissection. No pulmonary embolus evident. There are several foci of coronary artery calcification. There is left ventricular hypertrophy. The pericardium is not appreciably thickened. No parenchymal lung edema or consolidation. Rather minimal bibasilar atelectasis, like dependent in etiology. No appreciable thoracic adenopathy. Cholelithiasis. Focal hiatal hernia present. Electronically Signed   By: Lowella Grip III M.D.   On: 04/20/2015 07:37    Oren Binet, MD  [facility] Pager:[phone]  If 7PM-7AM, please contact night-coverage [email or web address] Password [facility] 04/21/2015, 11:49 AM

## 2015-04-21 NOTE — Consult Note (Signed)
Cardiologist: New.  His wife is followed by Dr. Elease Hashimoto Reason for Consult: Chest pain Referring Physician: Samier Jaco is an 70 y.o. male.  HPI:   Patient is a 70 year old male with history of diabetes, hypertension, asthma.  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  He had a 2-D echocardiogram yesterday revealing normal wall motion and systolic function of the left ventricle.  He presented with chest pain.  EKG shows normal sinus rhythm with a rate of 67 bpm.  Nonspecific inferior T-wave changes.  Initial 2 troponins were negative however, this third one was 0.04.    Patient reports developing 9/10 chest pressure and nausea and vomiting at 0130 hours yesterday morning.  He vomited one time each hour for the next 3 hours. He felt a little bit sweaty. Chest pain started resolving within less than an hour after getting Nitropaste.  His right upper quadrant was hurting him but he thinks is related to the vomiting.  The patient currently denies fever, shortness of breath, orthopnea, dizziness, PND, cough, congestion, abdominal pain, hematochezia, melena, lower extremity edema, claudication.   Past Medical History  Diagnosis Date  . Diabetes mellitus   . Hypertension   . Asthma     Past Surgical History  Procedure Laterality Date  . Replacement total knee Bilateral   . Total hip arthroplasty Right     Family History  Problem Relation Age of Onset  . Cancer - Lung Father   . Deep vein thrombosis Mother     Social History:  reports that he has never smoked. He does not have any smokeless tobacco history on file. He reports that he does not drink alcohol or use illicit drugs.  Allergies:  Allergies  Allergen Reactions  . Shrimp [Shellfish Allergy] Anaphylaxis    Medications:  Scheduled Meds: . aspirin  81 mg Oral Daily  . enoxaparin (LOVENOX) injection  40 mg Subcutaneous Q24H  . insulin aspart  0-9 Units Subcutaneous TID WC  . insulin glargine  58 Units  Subcutaneous QHS  . lisinopril  10 mg Oral Daily  . simvastatin  20 mg Oral Daily  . tamsulosin  0.4 mg Oral BID   Continuous Infusions: . sodium chloride 50 mL/hr (04/20/15 1001)   PRN Meds:.acetaminophen, gi cocktail, morphine injection, ondansetron (ZOFRAN) IV   Results for orders placed or performed during the hospital encounter of 04/20/15 (from the past 48 hour(s))  CBC     Status: Abnormal   Collection Time: 04/20/15  5:23 AM  Result Value Ref Range   WBC 12.7 (H) 4.0 - 10.5 K/uL   RBC 4.52 4.22 - 5.81 MIL/uL   Hemoglobin 14.0 13.0 - 17.0 g/dL   HCT 70.9 29.5 - 74.7 %   MCV 90.0 78.0 - 100.0 fL   MCH 31.0 26.0 - 34.0 pg   MCHC 34.4 30.0 - 36.0 g/dL   RDW 34.0 37.0 - 96.4 %   Platelets 238 150 - 400 K/uL  Comprehensive metabolic panel     Status: Abnormal   Collection Time: 04/20/15  5:23 AM  Result Value Ref Range   Sodium 138 135 - 145 mmol/L   Potassium 3.7 3.5 - 5.1 mmol/L   Chloride 102 101 - 111 mmol/L   CO2 24 22 - 32 mmol/L   Glucose, Bld 181 (H) 65 - 99 mg/dL   BUN 15 6 - 20 mg/dL   Creatinine, Ser 3.83 0.61 - 1.24 mg/dL   Calcium 9.1 8.9 -  10.3 mg/dL   Total Protein 8.1 6.5 - 8.1 g/dL   Albumin 3.8 3.5 - 5.0 g/dL   AST 25 15 - 41 U/L   ALT 26 17 - 63 U/L   Alkaline Phosphatase 49 38 - 126 U/L   Total Bilirubin 0.7 0.3 - 1.2 mg/dL   GFR calc non Af Amer >60 >60 mL/min   GFR calc Af Amer >60 >60 mL/min    Comment: (NOTE) The eGFR has been calculated using the CKD EPI equation. This calculation has not been validated in all clinical situations. eGFR's persistently <60 mL/min signify possible Chronic Kidney Disease.    Anion gap 12 5 - 15  Lipase, blood     Status: None   Collection Time: 04/20/15  5:23 AM  Result Value Ref Range   Lipase 31 11 - 51 U/L  I-stat troponin, ED (not at [facility], [facility])     Status: None   Collection Time: 04/20/15  5:32 AM  Result Value Ref Range   Troponin i, poc 0.01 0.00 - 0.08 ng/mL   Comment 3            Comment: Due  to the release kinetics of cTnI, a negative result within the first hours of the onset of symptoms does not rule out myocardial infarction with certainty. If myocardial infarction is still suspected, repeat the test at appropriate intervals.   I-Stat Chem 8, ED     Status: Abnormal   Collection Time: 04/20/15  5:38 AM  Result Value Ref Range   Sodium 139 135 - 145 mmol/L   Potassium 4.1 3.5 - 5.1 mmol/L   Chloride 104 101 - 111 mmol/L   BUN 20 6 - 20 mg/dL   Creatinine, Ser 0.70 0.61 - 1.24 mg/dL   Glucose, Bld 180 (H) 65 - 99 mg/dL   Calcium, Ion 0.96 (L) 1.13 - 1.30 mmol/L   TCO2 24 0 - 100 mmol/L   Hemoglobin 15.6 13.0 - 17.0 g/dL   HCT 46.0 39.0 - 52.0 %  Troponin I-serum (0, 3, 6 hours)     Status: None   Collection Time: 04/20/15  9:55 AM  Result Value Ref Range   Troponin I <0.03 <0.031 ng/mL    Comment:        NO INDICATION OF MYOCARDIAL INJURY.   CBC     Status: Abnormal   Collection Time: 04/20/15  9:55 AM  Result Value Ref Range   WBC 18.1 (H) 4.0 - 10.5 K/uL   RBC 4.36 4.22 - 5.81 MIL/uL   Hemoglobin 13.8 13.0 - 17.0 g/dL   HCT 39.6 39.0 - 52.0 %   MCV 90.8 78.0 - 100.0 fL   MCH 31.7 26.0 - 34.0 pg   MCHC 34.8 30.0 - 36.0 g/dL   RDW 13.8 11.5 - 15.5 %   Platelets 212 150 - 400 K/uL  Hemoglobin A1c     Status: Abnormal   Collection Time: 04/20/15  9:55 AM  Result Value Ref Range   Hgb A1c MFr Bld 7.8 (H) 4.8 - 5.6 %    Comment: (NOTE)         Pre-diabetes: 5.7 - 6.4         Diabetes: >6.4         Glycemic control for adults with diabetes: <7.0    Mean Plasma Glucose 177 mg/dL    Comment: (NOTE) Performed At: [facility] [place], [place] [id number] Lindon Romp MD [id number]   CBG  monitoring, ED     Status: Abnormal   Collection Time: 04/20/15  1:26 PM  Result Value Ref Range   Glucose-Capillary 140 (H) 65 - 99 mg/dL  Troponin I-serum (0, 3, 6 hours)     Status: Abnormal   Collection Time: 04/20/15  3:22 PM  Result  Value Ref Range   Troponin I 0.04 (H) <0.031 ng/mL    Comment:        PERSISTENTLY INCREASED TROPONIN VALUES IN THE RANGE OF 0.04-0.49 ng/mL CAN BE SEEN IN:       -UNSTABLE ANGINA       -CONGESTIVE HEART FAILURE       -MYOCARDITIS       -CHEST TRAUMA       -ARRYHTHMIAS       -LATE PRESENTING MYOCARDIAL INFARCTION       -COPD   CLINICAL FOLLOW-UP RECOMMENDED.   CBG monitoring, ED     Status: None   Collection Time: 04/20/15  7:29 PM  Result Value Ref Range   Glucose-Capillary 77 65 - 99 mg/dL  Glucose, capillary     Status: None   Collection Time: 04/20/15  8:30 PM  Result Value Ref Range   Glucose-Capillary 96 65 - 99 mg/dL  Glucose, capillary     Status: None   Collection Time: 04/21/15  6:54 AM  Result Value Ref Range   Glucose-Capillary 87 65 - 99 mg/dL    Dg Chest Portable 1 View  04/20/2015  CLINICAL DATA:  Acute onset of generalized chest pressure. Initial encounter. EXAM: PORTABLE CHEST 1 VIEW COMPARISON:  Chest radiograph performed 06/08/2007 FINDINGS: The lungs are well-aerated and clear. There is no evidence of focal opacification, pleural effusion or pneumothorax. The cardiomediastinal silhouette is within normal limits. No acute osseous abnormalities are seen. IMPRESSION: No acute cardiopulmonary process seen. Electronically Signed   By: Roanna Raider M.D.   On: 04/20/2015 05:38   Ct Angio Chest Aorta W/cm &/or Wo/cm  04/20/2015  CLINICAL DATA:  Chest pain radiating to back EXAM: CT ANGIOGRAPHY CHEST WITH CONTRAST TECHNIQUE: Initially, axial CT images were obtained through the chest without intravenous contrast material administration. Multidetector CT imaging of the chest was performed using the standard protocol during bolus administration of intravenous contrast. Multiplanar CT image reconstructions and MIPs were obtained to evaluate the vascular anatomy. CONTRAST:  OMNIPAQUE IOHEXOL 350 MG/ML SOLN COMPARISON:  Chest radiograph April 20, 2015 ; CT abdomen and  pelvis including lung bases June 04, 2010 FINDINGS: Mediastinum/Lymph Nodes: There is no appreciable thoracic aortic aneurysm or thoracic aortic dissection. The ascending thoracic aortic diameter is 3.8 x 3.7 cm. The visualized great vessels appear normal. Note that the right and left common carotid arteries arise as a common trunk, an anatomic variant. There is no demonstrable pulmonary embolus. The pericardium is not thickened. There are scattered foci of coronary artery calcification. There is left ventricular hypertrophy. Visualized thyroid appears unremarkable. There is no thoracic aortic aneurysm. There is a focal hiatal hernia. Lungs/Pleura: There is no parenchymal lung edema or consolidation. Slight bibasilar atelectasis is present, probably dependent in etiology. Upper abdomen: In the visualized upper abdomen, there is cholelithiasis. There is a degree of hepatic steatosis. The visualized upper abdominal structures otherwise appear unremarkable. Musculoskeletal: There are no blastic or lytic bone lesions. No intramuscular lesions are identified. Review of the MIP images confirms the above findings. IMPRESSION: No demonstrable thoracic aortic aneurysm or dissection. No pulmonary embolus evident. There are several foci of coronary artery calcification. There  is left ventricular hypertrophy. The pericardium is not appreciably thickened. No parenchymal lung edema or consolidation. Rather minimal bibasilar atelectasis, like dependent in etiology. No appreciable thoracic adenopathy. Cholelithiasis. Focal hiatal hernia present. Electronically Signed   By: Lowella Grip III M.D.   On: 04/20/2015 07:37    Review of Systems  Constitutional: Positive for diaphoresis. Negative for fever.  HENT: Negative for congestion and sore throat.   Respiratory: Negative for cough and shortness of breath.   Cardiovascular: Positive for chest pain. Negative for palpitations, orthopnea, leg swelling and PND.    Gastrointestinal: Positive for nausea, vomiting and abdominal pain. Negative for blood in stool and melena.  Musculoskeletal: Positive for back pain (chronic). Negative for myalgias.  Neurological: Negative for dizziness.  All other systems reviewed and are negative.  Blood pressure 115/63, pulse 83, temperature 98.4 F (36.9 C), temperature source Oral, resp. rate 18, weight 200 lb 6.4 oz (90.9 kg), SpO2 98 %. Physical Exam  Nursing note and vitals reviewed. Constitutional: He is oriented to person, place, and time. He appears well-developed and well-nourished. No distress.  HENT:  Head: Normocephalic and atraumatic.  Eyes: EOM are normal. Pupils are equal, round, and reactive to light. No scleral icterus.  Neck: Normal range of motion. Neck supple. No JVD present.  Cardiovascular: Normal rate, regular rhythm, S1 normal and S2 normal.   No murmur heard. Pulses:      Radial pulses are 2+ on the right side, and 2+ on the left side.       Dorsalis pedis pulses are 2+ on the right side, and 2+ on the left side.  Respiratory: Breath sounds normal. He has no wheezes. He has no rales.  GI: Soft. Bowel sounds are normal. He exhibits no distension. There is tenderness (mild right upper quadrant).  Musculoskeletal: He exhibits no edema.  Lymphadenopathy:    He has no cervical adenopathy.  Neurological: He is alert and oriented to person, place, and time. He exhibits normal muscle tone.  Skin: Skin is warm and dry.  Psychiatric: He has a normal mood and affect.    Assessment/Plan: Active Problems:   Chest pain   Chest wall pain   DM2 (diabetes mellitus, type 2) (HCC)   Hyperlipidemia   Epigastric pain   Nausea and vomiting   BPH (benign prostatic hyperplasia)  Patient's symptoms sound typical but also could be related to GI issues. Given the severity of his pain, I suspect his troponin have been greater than 0.04.  His EKG shows minor T-wave changes inferiorly. Is currently pain free  on nitroglycerin paste. CT angiogram the chest was negative for pulmonary embolism however, there are several foci of coronary artery calcification.  Cholelithiasis was also noted.  I am leaning toward a treadmill Cardiolite but will discuss with MD.   Tarri Fuller, [place] 04/21/2015, 9:33 AM   History and all data above reviewed.  Patient examined.  I agree with the findings as above.  The patient has had chest pain that has predominantly atypical features.  However, he has long standing diabetes.  No objective evidence of ischemia.  Echo was OK.  The patient exam reveals COR:RRR  ,  Lungs: Clear  ,  Abd: Positive bowel sounds, no rebound no guarding, Ext No edema  .  All available labs, radiology testing, previous records reviewed. Agree with documented assessment and plan. Chest pain:  The pretest probability of obstructive CAD is moderate.  Stress testing is indicated.  Plan Lexiscan Myoview.   Further  evaluation based on this result.   Leukocytosis:  Apparently this has been chronic.  Follow up per primary team.     Minus Breeding  10:43 AM  04/21/2015

## 2015-04-22 DIAGNOSIS — R1013 Epigastric pain: Secondary | ICD-10-CM | POA: Diagnosis not present

## 2015-04-22 DIAGNOSIS — Z96653 Presence of artificial knee joint, bilateral: Secondary | ICD-10-CM | POA: Diagnosis present

## 2015-04-22 DIAGNOSIS — E11649 Type 2 diabetes mellitus with hypoglycemia without coma: Secondary | ICD-10-CM | POA: Diagnosis not present

## 2015-04-22 DIAGNOSIS — K8000 Calculus of gallbladder with acute cholecystitis without obstruction: Secondary | ICD-10-CM | POA: Diagnosis not present

## 2015-04-22 DIAGNOSIS — Z7984 Long term (current) use of oral hypoglycemic drugs: Secondary | ICD-10-CM | POA: Diagnosis not present

## 2015-04-22 DIAGNOSIS — I10 Essential (primary) hypertension: Secondary | ICD-10-CM | POA: Diagnosis not present

## 2015-04-22 DIAGNOSIS — E118 Type 2 diabetes mellitus with unspecified complications: Secondary | ICD-10-CM | POA: Diagnosis not present

## 2015-04-22 DIAGNOSIS — K8010 Calculus of gallbladder with chronic cholecystitis without obstruction: Secondary | ICD-10-CM | POA: Diagnosis not present

## 2015-04-22 DIAGNOSIS — R0789 Other chest pain: Secondary | ICD-10-CM | POA: Diagnosis not present

## 2015-04-22 DIAGNOSIS — Z96641 Presence of right artificial hip joint: Secondary | ICD-10-CM | POA: Diagnosis present

## 2015-04-22 DIAGNOSIS — N400 Enlarged prostate without lower urinary tract symptoms: Secondary | ICD-10-CM | POA: Diagnosis not present

## 2015-04-22 DIAGNOSIS — K810 Acute cholecystitis: Secondary | ICD-10-CM | POA: Diagnosis present

## 2015-04-22 DIAGNOSIS — Z7982 Long term (current) use of aspirin: Secondary | ICD-10-CM | POA: Diagnosis not present

## 2015-04-22 DIAGNOSIS — R7989 Other specified abnormal findings of blood chemistry: Secondary | ICD-10-CM | POA: Diagnosis present

## 2015-04-22 DIAGNOSIS — J45909 Unspecified asthma, uncomplicated: Secondary | ICD-10-CM | POA: Diagnosis present

## 2015-04-22 DIAGNOSIS — Z91013 Allergy to seafood: Secondary | ICD-10-CM | POA: Diagnosis not present

## 2015-04-22 DIAGNOSIS — K449 Diaphragmatic hernia without obstruction or gangrene: Secondary | ICD-10-CM | POA: Diagnosis present

## 2015-04-22 DIAGNOSIS — Z794 Long term (current) use of insulin: Secondary | ICD-10-CM | POA: Diagnosis not present

## 2015-04-22 DIAGNOSIS — E785 Hyperlipidemia, unspecified: Secondary | ICD-10-CM | POA: Diagnosis not present

## 2015-04-22 DIAGNOSIS — Z79899 Other long term (current) drug therapy: Secondary | ICD-10-CM | POA: Diagnosis not present

## 2015-04-22 DIAGNOSIS — E876 Hypokalemia: Secondary | ICD-10-CM | POA: Diagnosis not present

## 2015-04-22 DIAGNOSIS — K8012 Calculus of gallbladder with acute and chronic cholecystitis without obstruction: Secondary | ICD-10-CM | POA: Diagnosis not present

## 2015-04-22 LAB — COMPREHENSIVE METABOLIC PANEL
ALK PHOS: 50 U/L (ref 38–126)
ALT: 21 U/L (ref 17–63)
AST: 21 U/L (ref 15–41)
Albumin: 2.8 g/dL — ABNORMAL LOW (ref 3.5–5.0)
Anion gap: 11 (ref 5–15)
BUN: 16 mg/dL (ref 6–20)
CHLORIDE: 102 mmol/L (ref 101–111)
CO2: 25 mmol/L (ref 22–32)
CREATININE: 0.78 mg/dL (ref 0.61–1.24)
Calcium: 8.5 mg/dL — ABNORMAL LOW (ref 8.9–10.3)
GFR calc Af Amer: >60 mL/min (ref >60)
GFR calc non Af Amer: >60 mL/min (ref >60)
Glucose, Bld: 50 mg/dL — ABNORMAL LOW (ref 65–99)
Potassium: 2.8 mmol/L — ABNORMAL LOW (ref 3.5–5.1)
SODIUM: 138 mmol/L (ref 135–145)
Total Bilirubin: 1.3 mg/dL — ABNORMAL HIGH (ref 0.3–1.2)
Total Protein: 6.8 g/dL (ref 6.5–8.1)

## 2015-04-22 LAB — CBC WITH DIFFERENTIAL/PLATELET
BASOS ABS: 0 10*3/uL (ref 0.0–0.1)
Basophils Relative: 0 %
EOS ABS: 0 10*3/uL (ref 0.0–0.7)
EOS PCT: 0 %
HCT: 35.4 % — ABNORMAL LOW (ref 39.0–52.0)
HEMOGLOBIN: 11.8 g/dL — AB (ref 13.0–17.0)
LYMPHS PCT: 10 %
Lymphs Abs: 2.1 10*3/uL (ref 0.7–4.0)
MCH: 30.3 pg (ref 26.0–34.0)
MCHC: 33.3 g/dL (ref 30.0–36.0)
MCV: 91 fL (ref 78.0–100.0)
Monocytes Absolute: 2 10*3/uL — ABNORMAL HIGH (ref 0.1–1.0)
Monocytes Relative: 10 %
NEUTROS PCT: 80 %
Neutro Abs: 17.2 10*3/uL — ABNORMAL HIGH (ref 1.7–7.7)
PLATELETS: 187 10*3/uL (ref 150–400)
RBC: 3.89 MIL/uL — AB (ref 4.22–5.81)
RDW: 14.2 % (ref 11.5–15.5)
WBC: 21.4 10*3/uL — AB (ref 4.0–10.5)

## 2015-04-22 LAB — URINALYSIS, ROUTINE W REFLEX MICROSCOPIC
GLUCOSE, UA: NEGATIVE mg/dL
Glucose, UA: NEGATIVE mg/dL
KETONES UR: 15 mg/dL — AB
Ketones, ur: 40 mg/dL — AB
NITRITE: POSITIVE — AB
Nitrite: POSITIVE — AB
PH: 6 (ref 5.0–8.0)
PROTEIN: 100 mg/dL — AB
Protein, ur: 100 mg/dL — AB
SPECIFIC GRAVITY, URINE: 1.024 (ref 1.005–1.030)
Specific Gravity, Urine: 1.026 (ref 1.005–1.030)
pH: 6 (ref 5.0–8.0)

## 2015-04-22 LAB — URINE MICROSCOPIC-ADD ON

## 2015-04-22 LAB — GLUCOSE, CAPILLARY
GLUCOSE-CAPILLARY: 76 mg/dL (ref 65–99)
GLUCOSE-CAPILLARY: 165 mg/dL — AB (ref 65–99)
GLUCOSE-CAPILLARY: 91 mg/dL (ref 65–99)
Glucose-Capillary: 55 mg/dL — ABNORMAL LOW (ref 65–99)
Glucose-Capillary: 72 mg/dL (ref 65–99)

## 2015-04-22 LAB — PROTIME-INR
INR: 1.3 (ref 0.00–1.49)
PROTHROMBIN TIME: 16.3 s — AB (ref 11.6–15.2)

## 2015-04-22 LAB — LIPASE, BLOOD: LIPASE: 19 U/L (ref 11–51)

## 2015-04-22 MED ORDER — SODIUM CHLORIDE 0.9 % IV SOLN
3.0000 g | Freq: Four times a day (QID) | INTRAVENOUS | Status: DC
  Filled 2015-04-22 (×3): qty 3

## 2015-04-22 MED ORDER — INSULIN GLARGINE 100 UNIT/ML ~~LOC~~ SOLN
45.0000 [IU] | Freq: Every day | SUBCUTANEOUS | Status: DC
  Administered 2015-04-22: 45 [IU] via SUBCUTANEOUS
  Filled 2015-04-22 (×2): qty 0.45

## 2015-04-22 MED ORDER — POTASSIUM CHLORIDE CRYS ER 20 MEQ PO TBCR
40.0000 meq | EXTENDED_RELEASE_TABLET | Freq: Two times a day (BID) | ORAL | Status: AC
  Administered 2015-04-22 (×2): 40 meq via ORAL
  Filled 2015-04-22 (×2): qty 2

## 2015-04-22 MED ORDER — PIPERACILLIN-TAZOBACTAM 3.375 G IVPB
3.3750 g | Freq: Three times a day (TID) | INTRAVENOUS | Status: DC
  Administered 2015-04-22 – 2015-04-24 (×7): 3.375 g via INTRAVENOUS
  Filled 2015-04-22 (×10): qty 50

## 2015-04-22 MED ORDER — POTASSIUM CHLORIDE 10 MEQ/100ML IV SOLN
10.0000 meq | INTRAVENOUS | Status: AC
  Administered 2015-04-22 (×2): 10 meq via INTRAVENOUS
  Filled 2015-04-22 (×2): qty 100

## 2015-04-22 MED ORDER — SODIUM CHLORIDE 0.9 % IV SOLN
INTRAVENOUS | Status: DC
  Administered 2015-04-23 – 2015-04-24 (×2): via INTRAVENOUS

## 2015-04-22 NOTE — Consult Note (Signed)
Reason for Consult:  Acute cholecystitis Referring Physician: Dr. Nena Alexander,    Jeff Rodriguez is an 70 y.o. male.  HPI: Pt woke up on 2/27 with chest pain and vomiting.  Pain was 9/10 in the ED, he was having non bilious, non bloody emesis in ED also.  Work up in the ED he was afebrile, and VSS on admit. One troponin was slightly elevated, LFT's were normal.  WBC was mildly elevated.  Possible UTI.  I do not see a urine culture. (He is self cathing for urinary retention) CXR was unremarkable.  EKG was also unremarkable. CT scan showed no thoracic aneurysm or dissection, no PE, some coronary calcification, and cholelithiasis.  Myoview study yesterday shows normal rest and stress perfusion with EF 55-60%.  Ultrasound yesterday shows Cholelithiasis with mild gallbladder wall thickening, nonspecific. No sonographic Murphy sign elicited during the study.  His WBC increased to 22.7 yesterday and today it is up to 21.4.  He is not on antibiotics at this point.  Blood cultures are still pending.  Pharmacy has been consulted for Unasyn.  We are ask to see.   Past Medical History  Diagnosis Date  . Diabetes mellitus   . Hypertension   . Asthma     Past Surgical History  Procedure Laterality Date  . Replacement total knee Bilateral   . Total hip arthroplasty Right     Family History  Problem Relation Age of Onset  . Cancer - Lung Father   . Deep vein thrombosis Mother     Social History:  reports that he has never smoked. He does not have any smokeless tobacco history on file. He reports that he does not drink alcohol or use illicit drugs.  Allergies:  Allergies  Allergen Reactions  . Shrimp [Shellfish Allergy] Anaphylaxis    Prior to Admission medications   Medication Sig Start Date End Date Taking? Authorizing Provider  aspirin 81 MG chewable tablet Chew 81 mg by mouth daily.   Yes Historical Provider, MD  cyclobenzaprine (FLEXERIL) 5 MG tablet Take 5 mg by mouth 3 (three) times  daily as needed for muscle spasms.   Yes Historical Provider, MD  finasteride (PROSCAR) 5 MG tablet Take 5 mg by mouth daily. He has just started this.  Yes Historical Provider, MD  insulin glargine (LANTUS) 100 UNIT/ML injection Inject 58 Units into the skin at bedtime.   Yes Historical Provider, MD  lisinopril (PRINIVIL,ZESTRIL) 20 MG tablet Take 10 mg by mouth daily.   Yes Historical Provider, MD  metFORMIN (GLUCOPHAGE) 1000 MG tablet Take 1,000 mg by mouth 2 (two) times daily with a meal.   Yes Historical Provider, MD  simvastatin (ZOCOR) 40 MG tablet Take 40 mg by mouth at bedtime.   Yes Historical Provider, MD  tamsulosin (FLOMAX) 0.4 MG CAPS capsule Take 0.8 mg by mouth at bedtime.    Yes Historical Provider, MD     Results for orders placed or performed during the hospital encounter of 04/20/15 (from the past 48 hour(s))  Troponin I-serum (0, 3, 6 hours)     Status: None   Collection Time: 04/20/15  9:55 AM  Result Value Ref Range   Troponin I <0.03 <0.031 ng/mL    Comment:        NO INDICATION OF MYOCARDIAL INJURY.   CBC     Status: Abnormal   Collection Time: 04/20/15  9:55 AM  Result Value Ref Range   WBC 18.1 (H) 4.0 - 10.5 K/uL  RBC 4.36 4.22 - 5.81 MIL/uL   Hemoglobin 13.8 13.0 - 17.0 g/dL   HCT 39.6 39.0 - 52.0 %   MCV 90.8 78.0 - 100.0 fL   MCH 31.7 26.0 - 34.0 pg   MCHC 34.8 30.0 - 36.0 g/dL   RDW 13.8 11.5 - 15.5 %   Platelets 212 150 - 400 K/uL  Hemoglobin A1c     Status: Abnormal   Collection Time: 04/20/15  9:55 AM  Result Value Ref Range   Hgb A1c MFr Bld 7.8 (H) 4.8 - 5.6 %    Comment: (NOTE)         Pre-diabetes: 5.7 - 6.4         Diabetes: >6.4         Glycemic control for adults with diabetes: <7.0    Mean Plasma Glucose 177 mg/dL    Comment: (NOTE) Performed At: [facility] [address] [place], [place] [id number] Lindon Romp MD [id number]   CBG monitoring, ED     Status: Abnormal   Collection Time: 04/20/15  1:26 PM   Result Value Ref Range   Glucose-Capillary 140 (H) 65 - 99 mg/dL  Troponin I-serum (0, 3, 6 hours)     Status: Abnormal   Collection Time: 04/20/15  3:22 PM  Result Value Ref Range   Troponin I 0.04 (H) <0.031 ng/mL    Comment:        PERSISTENTLY INCREASED TROPONIN VALUES IN THE RANGE OF 0.04-0.49 ng/mL CAN BE SEEN IN:       -UNSTABLE ANGINA       -CONGESTIVE HEART FAILURE       -MYOCARDITIS       -CHEST TRAUMA       -ARRYHTHMIAS       -LATE PRESENTING MYOCARDIAL INFARCTION       -COPD   CLINICAL FOLLOW-UP RECOMMENDED.   CBG monitoring, ED     Status: None   Collection Time: 04/20/15  7:29 PM  Result Value Ref Range   Glucose-Capillary 77 65 - 99 mg/dL  Glucose, capillary     Status: None   Collection Time: 04/20/15  8:30 PM  Result Value Ref Range   Glucose-Capillary 96 65 - 99 mg/dL  Glucose, capillary     Status: None   Collection Time: 04/21/15  6:54 AM  Result Value Ref Range   Glucose-Capillary 87 65 - 99 mg/dL  CBC with Differential/Platelet     Status: Abnormal   Collection Time: 04/21/15 10:18 AM  Result Value Ref Range   WBC 22.7 (H) 4.0 - 10.5 K/uL   RBC 3.87 (L) 4.22 - 5.81 MIL/uL   Hemoglobin 12.3 (L) 13.0 - 17.0 g/dL   HCT 35.2 (L) 39.0 - 52.0 %   MCV 91.0 78.0 - 100.0 fL   MCH 31.8 26.0 - 34.0 pg   MCHC 34.9 30.0 - 36.0 g/dL   RDW 14.3 11.5 - 15.5 %   Platelets 191 150 - 400 K/uL   Neutrophils Relative % 78 %   Neutro Abs 17.6 (H) 1.7 - 7.7 K/uL   Lymphocytes Relative 12 %   Lymphs Abs 2.7 0.7 - 4.0 K/uL   Monocytes Relative 10 %   Monocytes Absolute 2.3 (H) 0.1 - 1.0 K/uL   Eosinophils Relative 0 %   Eosinophils Absolute 0.0 0.0 - 0.7 K/uL   Basophils Relative 0 %   Basophils Absolute 0.0 0.0 - 0.1 K/uL  Comprehensive metabolic panel     Status: Abnormal  Collection Time: 04/21/15 10:18 AM  Result Value Ref Range   Sodium 141 135 - 145 mmol/L   Potassium 3.5 3.5 - 5.1 mmol/L   Chloride 106 101 - 111 mmol/L   CO2 26 22 - 32 mmol/L    Glucose, Bld 81 65 - 99 mg/dL   BUN 18 6 - 20 mg/dL   Creatinine, Ser 0.98 0.61 - 1.24 mg/dL   Calcium 8.3 (L) 8.9 - 10.3 mg/dL   Total Protein 6.7 6.5 - 8.1 g/dL   Albumin 3.0 (L) 3.5 - 5.0 g/dL   AST 18 15 - 41 U/L   ALT 22 17 - 63 U/L   Alkaline Phosphatase 38 38 - 126 U/L   Total Bilirubin 1.7 (H) 0.3 - 1.2 mg/dL   GFR calc non Af Amer >60 >60 mL/min   GFR calc Af Amer >60 >60 mL/min    Comment: (NOTE) The eGFR has been calculated using the CKD EPI equation. This calculation has not been validated in all clinical situations. eGFR's persistently <60 mL/min signify possible Chronic Kidney Disease.    Anion gap 9 5 - 15  Culture, blood (routine x 2)     Status: None (Preliminary result)   Collection Time: 04/21/15  5:20 PM  Result Value Ref Range   Specimen Description BLOOD LEFT ARM    Special Requests IN PEDIATRIC BOTTLE 3CC    Culture PENDING    Report Status PENDING   Culture, blood (routine x 2)     Status: None (Preliminary result)   Collection Time: 04/21/15  5:30 PM  Result Value Ref Range   Specimen Description BLOOD LEFT HAND    Special Requests IN PEDIATRIC BOTTLE 2CC    Culture PENDING    Report Status PENDING   Glucose, capillary     Status: None   Collection Time: 04/21/15  9:08 PM  Result Value Ref Range   Glucose-Capillary 78 65 - 99 mg/dL  CBC with Differential/Platelet     Status: Abnormal   Collection Time: 04/22/15  2:54 AM  Result Value Ref Range   WBC 21.4 (H) 4.0 - 10.5 K/uL   RBC 3.89 (L) 4.22 - 5.81 MIL/uL   Hemoglobin 11.8 (L) 13.0 - 17.0 g/dL   HCT 35.4 (L) 39.0 - 52.0 %   MCV 91.0 78.0 - 100.0 fL   MCH 30.3 26.0 - 34.0 pg   MCHC 33.3 30.0 - 36.0 g/dL   RDW 14.2 11.5 - 15.5 %   Platelets 187 150 - 400 K/uL   Neutrophils Relative % 80 %   Neutro Abs 17.2 (H) 1.7 - 7.7 K/uL   Lymphocytes Relative 10 %   Lymphs Abs 2.1 0.7 - 4.0 K/uL   Monocytes Relative 10 %   Monocytes Absolute 2.0 (H) 0.1 - 1.0 K/uL   Eosinophils Relative 0 %    Eosinophils Absolute 0.0 0.0 - 0.7 K/uL   Basophils Relative 0 %   Basophils Absolute 0.0 0.0 - 0.1 K/uL  Comprehensive metabolic panel     Status: Abnormal   Collection Time: 04/22/15  2:54 AM  Result Value Ref Range   Sodium 138 135 - 145 mmol/L   Potassium 2.8 (L) 3.5 - 5.1 mmol/L    Comment: DELTA CHECK NOTED   Chloride 102 101 - 111 mmol/L   CO2 25 22 - 32 mmol/L   Glucose, Bld 50 (L) 65 - 99 mg/dL   BUN 16 6 - 20 mg/dL   Creatinine, Ser 0.78 0.61 - 1.24  mg/dL   Calcium 8.5 (L) 8.9 - 10.3 mg/dL   Total Protein 6.8 6.5 - 8.1 g/dL   Albumin 2.8 (L) 3.5 - 5.0 g/dL   AST 21 15 - 41 U/L   ALT 21 17 - 63 U/L   Alkaline Phosphatase 50 38 - 126 U/L   Total Bilirubin 1.3 (H) 0.3 - 1.2 mg/dL   GFR calc non Af Amer >60 >60 mL/min   GFR calc Af Amer >60 >60 mL/min    Comment: (NOTE) The eGFR has been calculated using the CKD EPI equation. This calculation has not been validated in all clinical situations. eGFR's persistently <60 mL/min signify possible Chronic Kidney Disease.    Anion gap 11 5 - 15  Urinalysis, Routine w reflex microscopic (not at [facility])     Status: Abnormal   Collection Time: 04/22/15  3:16 AM  Result Value Ref Range   Color, Urine AMBER (A) YELLOW    Comment: BIOCHEMICALS MAY BE AFFECTED BY COLOR   APPearance CLOUDY (A) CLEAR   Specific Gravity, Urine 1.024 1.005 - 1.030   pH 6.0 5.0 - 8.0   Glucose, UA NEGATIVE NEGATIVE mg/dL   Hgb urine dipstick MODERATE (A) NEGATIVE   Bilirubin Urine SMALL (A) NEGATIVE   Ketones, ur 40 (A) NEGATIVE mg/dL   Protein, ur 100 (A) NEGATIVE mg/dL   Nitrite POSITIVE (A) NEGATIVE   Leukocytes, UA SMALL (A) NEGATIVE  Urine microscopic-add on     Status: Abnormal   Collection Time: 04/22/15  3:16 AM  Result Value Ref Range   Squamous Epithelial / LPF 0-5 (A) NONE SEEN   WBC, UA 6-30 0 - 5 WBC/hpf   RBC / HPF 6-30 0 - 5 RBC/hpf   Bacteria, UA MANY (A) NONE SEEN   Urine-Other MUCOUS PRESENT   Glucose, capillary     Status:  Abnormal   Collection Time: 04/22/15  5:59 AM  Result Value Ref Range   Glucose-Capillary 55 (L) 65 - 99 mg/dL  Glucose, capillary     Status: None   Collection Time: 04/22/15  6:26 AM  Result Value Ref Range   Glucose-Capillary 76 65 - 99 mg/dL   Comment 1 Notify RN     Nm Myocar Multi W/spect W/wall Motion / Ef  04/21/2015   There was no ST segment deviation noted during stress.  The study is normal.  This is a low risk study.  The left ventricular ejection fraction is normal (55-65%).  Normal resting and stress perfusion no ischemia or infarction EF 61%   US Abdomen Limited Ruq  04/21/2015  CLINICAL DATA:  Acute right upper quadrant abdominal pain EXAM: US ABDOMEN LIMITED - RIGHT UPPER QUADRANT COMPARISON:  06/04/2010, 04/20/2015 FINDINGS: Gallbladder: Echogenic shadowing gallstones within the gallbladder close to the neck area some of which are mobile. Wall thickness measures 4.3 mm, mildly thickened. Despite this, there is no sonographic Murphy sign elicited over the gallbladder during the exam. Common bile duct: Diameter: 3.7 mm, limited visualization. Liver: Diffuse increased echogenicity compatible with hepatic steatosis or fatty infiltration. Patent hepatic and portal veins with normal directional flow. No intrahepatic biliary dilatation or definite focal hepatic abnormality. No upper abdominal free fluid. Included views of the right kidney demonstrate no hydronephrosis. IMPRESSION: Cholelithiasis with mild gallbladder wall thickening, nonspecific. No sonographic Murphy sign elicited during the study. No biliary dilatation Hepatic steatosis Electronically Signed   By: Jerilynn Mages.  Shick M.D.   On: 04/21/2015 17:14    Review of Systems  Constitutional: Positive  for fever. Negative for chills, weight loss, malaise/fatigue and diaphoresis.  HENT: Negative.   Eyes: Negative.   Respiratory: Negative for cough, hemoptysis, sputum production, shortness of breath and wheezing.   Cardiovascular:  Positive for chest pain. Negative for palpitations, orthopnea, claudication, leg swelling and PND.  Gastrointestinal: Positive for heartburn, nausea, vomiting (pain RUQ) and abdominal pain. Negative for diarrhea, constipation, blood in stool and melena.  Genitourinary: Negative for dysuria.       Self cathing for BPH and urinary retention.  Skin: Negative.   Neurological: Negative.  Negative for weakness.  Endo/Heme/Allergies: Negative.   Psychiatric/Behavioral: Negative.    Blood pressure 142/70, pulse 89, temperature 100.9 F (38.3 C), temperature source Oral, resp. rate 18, height _0  (1.727 m), weight 90.719 kg (200 lb), SpO2 96 %. Physical Exam  Constitutional: He is oriented to person, place, and time. He appears well-developed and well-nourished. No distress.  HENT:  Head: Normocephalic and atraumatic.  Nose: Nose normal.  Eyes: Conjunctivae and EOM are normal. Right eye exhibits no discharge. Left eye exhibits no discharge. No scleral icterus.  Neck: Neck supple. No JVD present. No tracheal deviation present. No thyromegaly present.  Cardiovascular: Normal rate, regular rhythm, normal heart sounds and intact distal pulses.   No murmur heard. Respiratory: Effort normal and breath sounds normal. No respiratory distress. He has no wheezes. He has no rales. He exhibits no tenderness.  GI: Soft. Bowel sounds are normal. He exhibits distension (may be a little distended, but not much). He exhibits no mass. There is tenderness (right lower abdomen and mostly RUQ, over GB). There is no rebound and no guarding.  Musculoskeletal: He exhibits no edema or tenderness.  Lymphadenopathy:    He has no cervical adenopathy.  Neurological: He is alert and oriented to person, place, and time. No cranial nerve deficit.  Skin: Skin is warm and dry. No rash noted. He is not diaphoretic. No erythema. No pallor.  Psychiatric: He has a normal mood and affect. His behavior is normal. Judgment and thought  content normal.    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  HIDA is ordered and will be done tomorrow. I think he has acute cholecystitis also.  I would use the severe cholecystitis antibiotic protocol which includes either Zosyn, or cefepime/Flagyl.  I have stopped the Unasyn and ordered Zosyn.    His only allergy is shrimp. I would give him liquids for now and see how he does.  He has [place] toast and sausage at the bedside.  I will make sure he is NPO and recheck labs in AM.    Karisa Nesser 04/22/2015, 9:42 AM

## 2015-04-22 NOTE — Progress Notes (Addendum)
ANTIBIOTIC CONSULT NOTE - INITIAL  Pharmacy Consult for Unasyn   (ADDENDUM: changed to Zosyn) Indication: Intra-abdominal Infection  Allergies  Allergen Reactions  . Shrimp [Shellfish Allergy] Anaphylaxis    Patient Measurements: Height: 5\' 8"  (172.7 cm) Weight: 200 lb (90.719 kg) IBW/kg (Calculated) : 68.4   Vital Signs: Temp: 100.9 F (38.3 C) (03/01 0509) Temp Source: Oral (03/01 0509) BP: 142/70 mmHg (03/01 0509) Pulse Rate: 89 (03/01 0509) Intake/Output from previous day: 02/28 0701 - 03/01 0700 In: 0  Out: 100 [Urine:100] Intake/Output from this shift:    Labs:  Recent Labs  04/20/15 0538 04/20/15 0955 04/21/15 1018 04/22/15 0254  WBC  --  18.1* 22.7* 21.4*  HGB 15.6 13.8 12.3* 11.8*  PLT  --  212 191 187  CREATININE 0.70  --  0.98 0.78   Estimated Creatinine Clearance: 95.3 mL/min (by C-G formula based on Cr of 0.78). No results for input(s): VANCOTROUGH, VANCOPEAK, VANCORANDOM, GENTTROUGH, GENTPEAK, GENTRANDOM, TOBRATROUGH, TOBRAPEAK, TOBRARND, AMIKACINPEAK, AMIKACINTROU, AMIKACIN in the last 72 hours.   Microbiology: Recent Results (from the past 720 hour(s))  Culture, blood (routine x 2)     Status: None (Preliminary result)   Collection Time: 04/21/15  5:20 PM  Result Value Ref Range Status   Specimen Description BLOOD LEFT ARM  Final   Special Requests IN PEDIATRIC BOTTLE 3CC  Final   Culture PENDING  Incomplete   Report Status PENDING  Incomplete  Culture, blood (routine x 2)     Status: None (Preliminary result)   Collection Time: 04/21/15  5:30 PM  Result Value Ref Range Status   Specimen Description BLOOD LEFT HAND  Final   Special Requests IN PEDIATRIC BOTTLE 2CC  Final   Culture PENDING  Incomplete   Report Status PENDING  Incomplete    Medical History: Past Medical History  Diagnosis Date  . Diabetes mellitus   . Hypertension   . Asthma     Medications:  Prescriptions prior to admission  Medication Sig Dispense Refill Last  Dose  . aspirin 81 MG chewable tablet Chew 81 mg by mouth daily.   04/20/2015 at Unknown time  . cyclobenzaprine (FLEXERIL) 5 MG tablet Take 5 mg by mouth 3 (three) times daily as needed for muscle spasms.   04/19/2015 at Unknown time  . finasteride (PROSCAR) 5 MG tablet Take 5 mg by mouth daily.   04/19/2015 at Unknown time  . insulin glargine (LANTUS) 100 UNIT/ML injection Inject 58 Units into the skin at bedtime.   04/19/2015 at Unknown time  . lisinopril (PRINIVIL,ZESTRIL) 20 MG tablet Take 10 mg by mouth daily.   04/19/2015 at Unknown time  . metFORMIN (GLUCOPHAGE) 1000 MG tablet Take 1,000 mg by mouth 2 (two) times daily with a meal.   04/19/2015 at Unknown time  . simvastatin (ZOCOR) 40 MG tablet Take 40 mg by mouth at bedtime.   04/19/2015 at Unknown time  . tamsulosin (FLOMAX) 0.4 MG CAPS capsule Take 0.8 mg by mouth at bedtime.    04/19/2015 at Unknown time   Scheduled:  . aspirin  81 mg Oral Daily  . enoxaparin (LOVENOX) injection  40 mg Subcutaneous Q24H  . insulin aspart  0-9 Units Subcutaneous TID WC  . insulin glargine  58 Units Subcutaneous QHS  . lisinopril  10 mg Oral Daily  . potassium chloride  10 mEq Intravenous Q1 Hr x 2  . potassium chloride  40 mEq Oral BID  . regadenoson  0.4 mg Intravenous Once  . simvastatin  20 mg Oral Daily  . tamsulosin  0.4 mg Oral BID   Assessment: 70 y.o male , febrile, leukocytosis, and focal RUQ abdominal tenderness. CT scan showed cholelithiasis. General surgery consulted for gall bladder removal. Pharmacy to dose Unasyn IV for  Intra-abdominal Infection.    SC4 0.78, CrCl ~ 95 ml/min   2/28 Blood Cx x2 pending  Plan:  Unasyn 3 gm IV q6h Monitor renal function, culture results.   Nicole Cella, RPh Clinical Pharmacist Pager: [phone] 04/22/2015,9:42 AM   Addendum:  Surgery consulted and they have changed antibitoic to Zosyn per  the severe cholecystitis antibiotic protocol -pharmacy to dose.   Plan:  Unasyn DC'd by surgery Start Zosyn  3.375 g IV q8h (4h infusion) - be sure urine culture done prior to giving 1st Zosyn dose .  Nicole Cella, RPh Clinical Pharmacist Pager: [phone]

## 2015-04-22 NOTE — Progress Notes (Signed)
[facility] PROGRESS NOTE  Jeff Rodriguez [id number] DOB: 06-10-1945 DOA: 04/20/2015 PCP: No primary care provider on file.  Assessment/Plan: Chest pain/abdominal pain: Cardiology consulted - workup negative, including stress test. Suspect chest pain is more GI etiology, signs more consistent of cholecystitis today - pt became febrile with leukocytosis and focal RUQ abdominal tenderness. CT scan showed cholelithiasis, abdominal US also showed cholelithiasis with mild gallbladder wall thickening. General surgery consulted for gall bladder removal, pt started on Unasyn.  Hypokalemia: replete and recheck.  Type 2 diabetes: CBGs with hypoglycemia earlier this morning, decrease Lantus to 45 units, cautiously continue with SSI and follow.   Hypertension: Controlled, continue lisinopril. Follow  Dyslipidemia: Continue statin  BPH: Continue Flomax.  Code Status: Full code Family Communication: None Disposition Plan: remain inpatient   Consultants:  Cardiology  Surgery  Procedures:  None  Antibiotics:  Unasyn started 3/1  HPI/Subjective: 70 year old male with a history of DM2, HTN, and hyperlipidemia admitted for complaint of midsternal chest pain. Associated with several episodes of nausea vomiting. Chest pain resolved entirely, but abdominal pain continued and became more localized to the RUQ today. Incidental gall stones seen on CT scan. Abdominal US showed cholelithiasis with mild gallbladder wall thickening. 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 General surgery consulted. Pt started on Unasyn.  Cardiology signed out - pt had negative stress test. Troponins, EKG, CT angio, and 2D echo negative for anything acute.  Objective: Filed Vitals:   04/21/15 1900 04/22/15 0509  BP: 135/65 142/70  Pulse: 88 89  Temp: 100 F (37.8 C) 100.9 F  (38.3 C)  Resp: 18 18    Intake/Output Summary (Last 24 hours) at 04/22/15 0903 Last data filed at 04/21/15 1000  Gross per 24 hour  Intake      0 ml  Output    100 ml  Net   -100 ml   Filed Weights   04/20/15 2034 04/21/15 1900  Weight: 90.9 kg (200 lb 6.4 oz) 90.719 kg (200 lb)    Exam:   General: Alert and oriented, in no acute distress  Cardiovascular: RRR, no m/r/g  Respiratory: CTAB  Abdomen: Soft, non-distended, RUQ tenderness to palpation, no rebound or guarding  Musculoskeletal: No obvious swelling or deformity  Data Reviewed: Basic Metabolic Panel:  Recent Labs Lab 04/20/15 0523 04/20/15 0538 04/21/15 1018 04/22/15 0254  NA 138 139 141 138  K 3.7 4.1 3.5 2.8*  CL 102 104 106 102  CO2 24  --  26 25  GLUCOSE 181* 180* 81 50*  BUN 15 20 18 16   CREATININE 0.81 0.70 0.98 0.78  CALCIUM 9.1  --  8.3* 8.5*   Liver Function Tests:  Recent Labs Lab 04/20/15 0523 04/21/15 1018 04/22/15 0254  AST 25 18 21   ALT 26 22 21   ALKPHOS 49 38 50  BILITOT 0.7 1.7* 1.3*  PROT 8.1 6.7 6.8  ALBUMIN 3.8 3.0* 2.8*    Recent Labs Lab 04/20/15 0523  LIPASE 31   No results for input(s): AMMONIA in the last 168 hours. CBC:  Recent Labs Lab 04/20/15 0523 04/20/15 0538 04/20/15 0955 04/21/15 1018 04/22/15 0254  WBC 12.7*  --  18.1* 22.7* 21.4*  NEUTROABS  --   --   --  17.6* 17.2*  HGB 14.0 15.6 13.8 12.3* 11.8*  HCT 40.7 46.0 39.6 35.2* 35.4*  MCV 90.0  --  90.8 91.0 91.0  PLT 238  --  212 191 187   Cardiac Enzymes:  Recent Labs Lab 04/20/15 0955 04/20/15 1522  TROPONINI <0.03 0.04*   BNP (last 3 results) No results for input(s): BNP in the last 8760 hours.  ProBNP (last 3 results) No results for input(s): PROBNP in the last 8760 hours.  CBG:  Recent Labs Lab 04/20/15 2030 04/21/15 0654 04/21/15 2108 04/22/15 0559 04/22/15 0626  GLUCAP 96 87 78 55* 76    Recent Results (from the past 240 hour(s))  Culture, blood (routine x 2)      Status: None (Preliminary result)   Collection Time: 04/21/15  5:20 PM  Result Value Ref Range Status   Specimen Description BLOOD LEFT ARM  Final   Special Requests IN PEDIATRIC BOTTLE 3CC  Final   Culture PENDING  Incomplete   Report Status PENDING  Incomplete  Culture, blood (routine x 2)     Status: None (Preliminary result)   Collection Time: 04/21/15  5:30 PM  Result Value Ref Range Status   Specimen Description BLOOD LEFT HAND  Final   Special Requests IN PEDIATRIC BOTTLE 2CC  Final   Culture PENDING  Incomplete   Report Status PENDING  Incomplete     Studies: Nm Myocar Multi W/spect W/wall Motion / Ef  04/21/2015   There was no ST segment deviation noted during stress.  The study is normal.  This is a low risk study.  The left ventricular ejection fraction is normal (55-65%).  Normal resting and stress perfusion no ischemia or infarction EF 61%   US Abdomen Limited Ruq  04/21/2015  CLINICAL DATA:  Acute right upper quadrant abdominal pain EXAM: US ABDOMEN LIMITED - RIGHT UPPER QUADRANT COMPARISON:  06/04/2010, 04/20/2015 FINDINGS: Gallbladder: Echogenic shadowing gallstones within the gallbladder close to the neck area some of which are mobile. Wall thickness measures 4.3 mm, mildly thickened. Despite this, there is no sonographic Murphy sign elicited over the gallbladder during the exam. Common bile duct: Diameter: 3.7 mm, limited visualization. Liver: Diffuse increased echogenicity compatible with hepatic steatosis or fatty infiltration. Patent hepatic and portal veins with normal directional flow. No intrahepatic biliary dilatation or definite focal hepatic abnormality. No upper abdominal free fluid. Included views of the right kidney demonstrate no hydronephrosis. IMPRESSION: Cholelithiasis with mild gallbladder wall thickening, nonspecific. No sonographic Murphy sign elicited during the study. No biliary dilatation Hepatic steatosis Electronically Signed   By: Jerilynn Mages.  Shick M.D.    On: 04/21/2015 17:14    Scheduled Meds: . aspirin  81 mg Oral Daily  . enoxaparin (LOVENOX) injection  40 mg Subcutaneous Q24H  . insulin aspart  0-9 Units Subcutaneous TID WC  . insulin glargine  58 Units Subcutaneous QHS  . lisinopril  10 mg Oral Daily  . potassium chloride  10 mEq Intravenous Q1 Hr x 2  . potassium chloride  40 mEq Oral BID  . regadenoson  0.4 mg Intravenous Once  . simvastatin  20 mg Oral Daily  . tamsulosin  0.4 mg Oral BID   Continuous Infusions:   Active Problems:   Chest pain   Chest wall pain   DM2 (diabetes mellitus, type 2) (HCC)   Hyperlipidemia   Epigastric pain   Nausea and vomiting   BPH (benign prostatic hyperplasia)    Time spent: [place], PA-S [facility] If 7PM-7AM, please contact night-coverage at [email or web address], password [facility] 04/22/2015, 9:03 AM     Attending MD note  Patient was seen, examined,treatment plan was  discussed with the PA-S.  I have personally reviewed the clinical findings, lab, imaging studies and management of this patient in detail. I agree with the documentation, as recorded by the PA-S.   70 year old admitted with chest pain/epigastric pain, underwent nuclear stress test that was negative. Now has fever, pain is now more localized to the right upper quadrant area. Ultrasound of the abdomen is positive for cholelithiasis with some mild thickening of the gallbladder. Suspect patient's symptoms were evolving nvolving, suspect he had cholecystitis all along. Start Unasyn, keep nothing by mouth, have consulted general surgery for cholecystectomy. Cannot get a HIDA scan to 3/2 as he had contrast with CT angiogram chest on admission.   Rest as above   [facility]

## 2015-04-23 ENCOUNTER — Inpatient Hospital Stay (HOSPITAL_COMMUNITY): Payer: Medicare Other | Admitting: Anesthesiology

## 2015-04-23 ENCOUNTER — Encounter (HOSPITAL_COMMUNITY)
Admission: EM | Disposition: A | Discharge status: Home / Self Care | Payer: Self-pay | Source: Home / Self Care | Attending: Internal Medicine

## 2015-04-23 ENCOUNTER — Inpatient Hospital Stay (HOSPITAL_COMMUNITY): Payer: Medicare Other

## 2015-04-23 ENCOUNTER — Encounter (HOSPITAL_COMMUNITY): Payer: Self-pay | Admitting: Anesthesiology

## 2015-04-23 HISTORY — PX: CHOLECYSTECTOMY: SHX55

## 2015-04-23 LAB — GLUCOSE, CAPILLARY
GLUCOSE-CAPILLARY: 56 mg/dL — AB (ref 65–99)
GLUCOSE-CAPILLARY: 85 mg/dL (ref 65–99)
GLUCOSE-CAPILLARY: 84 mg/dL (ref 65–99)
GLUCOSE-CAPILLARY: 128 mg/dL — AB (ref 65–99)
Glucose-Capillary: 81 mg/dL (ref 65–99)
Glucose-Capillary: 110 mg/dL — ABNORMAL HIGH (ref 65–99)

## 2015-04-23 LAB — COMPREHENSIVE METABOLIC PANEL
ALBUMIN: 2.7 g/dL — AB (ref 3.5–5.0)
ALK PHOS: 49 U/L (ref 38–126)
ALT: 23 U/L (ref 17–63)
AST: 23 U/L (ref 15–41)
Anion gap: 7 (ref 5–15)
BILIRUBIN TOTAL: 1.5 mg/dL — AB (ref 0.3–1.2)
BUN: 12 mg/dL (ref 6–20)
CO2: 25 mmol/L (ref 22–32)
CREATININE: 0.89 mg/dL (ref 0.61–1.24)
Calcium: 8.4 mg/dL — ABNORMAL LOW (ref 8.9–10.3)
Chloride: 104 mmol/L (ref 101–111)
GFR calc Af Amer: >60 mL/min (ref >60)
GFR calc non Af Amer: >60 mL/min (ref >60)
GLUCOSE: 80 mg/dL (ref 65–99)
POTASSIUM: 4.1 mmol/L (ref 3.5–5.1)
Sodium: 136 mmol/L (ref 135–145)
TOTAL PROTEIN: 6.8 g/dL (ref 6.5–8.1)

## 2015-04-23 LAB — CBC
HEMATOCRIT: 33.9 % — AB (ref 39.0–52.0)
HEMOGLOBIN: 11.4 g/dL — AB (ref 13.0–17.0)
MCH: 30.4 pg (ref 26.0–34.0)
MCHC: 33.6 g/dL (ref 30.0–36.0)
MCV: 90.4 fL (ref 78.0–100.0)
Platelets: 178 10*3/uL (ref 150–400)
RBC: 3.75 MIL/uL — AB (ref 4.22–5.81)
RDW: 13.9 % (ref 11.5–15.5)
WBC: 15.2 10*3/uL — AB (ref 4.0–10.5)

## 2015-04-23 LAB — LIPASE, BLOOD: Lipase: 24 U/L (ref 11–51)

## 2015-04-23 LAB — SURGICAL PCR SCREEN
MRSA, PCR: NEGATIVE
Staphylococcus aureus: NEGATIVE

## 2015-04-23 SURGERY — LAPAROSCOPIC CHOLECYSTECTOMY WITH INTRAOPERATIVE CHOLANGIOGRAM
Anesthesia: General | Site: Abdomen

## 2015-04-23 MED ORDER — LIDOCAINE HCL (CARDIAC) 20 MG/ML IV SOLN
INTRAVENOUS | Status: DC | PRN
  Administered 2015-04-23: 100 mg via INTRAVENOUS

## 2015-04-23 MED ORDER — FINASTERIDE 5 MG PO TABS
5.0000 mg | ORAL_TABLET | Freq: Every day | ORAL | Status: DC
  Administered 2015-04-23 – 2015-04-25 (×3): 5 mg via ORAL
  Filled 2015-04-23 (×3): qty 1

## 2015-04-23 MED ORDER — PROPOFOL 10 MG/ML IV BOLUS
INTRAVENOUS | Status: AC
  Filled 2015-04-23: qty 20

## 2015-04-23 MED ORDER — FENTANYL CITRATE (PF) 250 MCG/5ML IJ SOLN
INTRAMUSCULAR | Status: AC
  Filled 2015-04-23: qty 5

## 2015-04-23 MED ORDER — DEXAMETHASONE SODIUM PHOSPHATE 4 MG/ML IJ SOLN
INTRAMUSCULAR | Status: AC
  Filled 2015-04-23: qty 1

## 2015-04-23 MED ORDER — HYDROMORPHONE HCL 1 MG/ML IJ SOLN
0.2500 mg | INTRAMUSCULAR | Status: DC | PRN
  Administered 2015-04-23 (×3): 0.5 mg via INTRAVENOUS

## 2015-04-23 MED ORDER — SODIUM CHLORIDE 0.9 % IR SOLN
Status: DC | PRN
  Administered 2015-04-23: 1

## 2015-04-23 MED ORDER — BUPIVACAINE HCL 0.25 % IJ SOLN
INTRAMUSCULAR | Status: DC | PRN
  Administered 2015-04-23: 7 mL

## 2015-04-23 MED ORDER — DEXTROSE 50 % IV SOLN
INTRAVENOUS | Status: AC
  Filled 2015-04-23: qty 50

## 2015-04-23 MED ORDER — INSULIN GLARGINE 100 UNIT/ML ~~LOC~~ SOLN
25.0000 [IU] | Freq: Every day | SUBCUTANEOUS | Status: DC
  Filled 2015-04-23: qty 0.25

## 2015-04-23 MED ORDER — HYDROMORPHONE HCL 1 MG/ML IJ SOLN
INTRAMUSCULAR | Status: AC
  Filled 2015-04-23: qty 1

## 2015-04-23 MED ORDER — DEXTROSE 50 % IV SOLN
25.0000 mL | Freq: Once | INTRAVENOUS | Status: AC
  Administered 2015-04-23: 25 mL via INTRAVENOUS

## 2015-04-23 MED ORDER — LACTATED RINGERS IV SOLN
INTRAVENOUS | Status: DC
  Administered 2015-04-23 (×2): via INTRAVENOUS

## 2015-04-23 MED ORDER — ONDANSETRON HCL 4 MG/2ML IJ SOLN
INTRAMUSCULAR | Status: AC
  Filled 2015-04-23: qty 2

## 2015-04-23 MED ORDER — INSULIN GLARGINE 100 UNIT/ML ~~LOC~~ SOLN: 30.0000 [IU] | Freq: Every day | SUBCUTANEOUS | Status: DC

## 2015-04-23 MED ORDER — SUGAMMADEX SODIUM 200 MG/2ML IV SOLN
INTRAVENOUS | Status: DC | PRN
  Administered 2015-04-23: 200 mg via INTRAVENOUS

## 2015-04-23 MED ORDER — BUPIVACAINE HCL (PF) 0.25 % IJ SOLN
INTRAMUSCULAR | Status: AC
  Filled 2015-04-23: qty 30

## 2015-04-23 MED ORDER — ONDANSETRON HCL 4 MG/2ML IJ SOLN
INTRAMUSCULAR | Status: DC | PRN
  Administered 2015-04-23: 4 mg via INTRAVENOUS

## 2015-04-23 MED ORDER — MIDAZOLAM HCL 2 MG/2ML IJ SOLN
INTRAMUSCULAR | Status: AC
  Filled 2015-04-23: qty 2

## 2015-04-23 MED ORDER — 0.9 % SODIUM CHLORIDE (POUR BTL) OPTIME
TOPICAL | Status: DC | PRN
  Administered 2015-04-23: 1000 mL

## 2015-04-23 MED ORDER — ROCURONIUM BROMIDE 50 MG/5ML IV SOLN
INTRAVENOUS | Status: AC
  Filled 2015-04-23: qty 1

## 2015-04-23 MED ORDER — INSULIN GLARGINE 100 UNIT/ML ~~LOC~~ SOLN
15.0000 [IU] | Freq: Every day | SUBCUTANEOUS | Status: DC
  Administered 2015-04-23 – 2015-04-24 (×2): 15 [IU] via SUBCUTANEOUS
  Filled 2015-04-23 (×3): qty 0.15

## 2015-04-23 MED ORDER — PROMETHAZINE HCL 25 MG/ML IJ SOLN: 6.2500 mg | INTRAMUSCULAR | Status: DC | PRN

## 2015-04-23 MED ORDER — PROPOFOL 10 MG/ML IV BOLUS
INTRAVENOUS | Status: DC | PRN
  Administered 2015-04-23 (×2): 20 mg via INTRAVENOUS
  Administered 2015-04-23: 160 mg via INTRAVENOUS

## 2015-04-23 MED ORDER — ROCURONIUM BROMIDE 100 MG/10ML IV SOLN
INTRAVENOUS | Status: DC | PRN
  Administered 2015-04-23: 50 mg via INTRAVENOUS
  Administered 2015-04-23: 5 mg via INTRAVENOUS
  Administered 2015-04-23: 10 mg via INTRAVENOUS

## 2015-04-23 MED ORDER — DEXTROSE 50 % IV SOLN
INTRAVENOUS | Status: AC
Start: 2015-04-23 — End: 2015-04-23
  Administered 2015-04-23: 15 mL
  Filled 2015-04-23: qty 50

## 2015-04-23 MED ORDER — IOHEXOL 300 MG/ML  SOLN
INTRAMUSCULAR | Status: DC | PRN
  Administered 2015-04-23: 13 mL

## 2015-04-23 MED ORDER — LIDOCAINE HCL (CARDIAC) 20 MG/ML IV SOLN
INTRAVENOUS | Status: AC
  Filled 2015-04-23: qty 5

## 2015-04-23 MED ORDER — OXYCODONE-ACETAMINOPHEN 5-325 MG PO TABS
1.0000 | ORAL_TABLET | ORAL | Status: DC | PRN
  Administered 2015-04-23 – 2015-04-24 (×5): 2 via ORAL
  Administered 2015-04-25: 1 via ORAL
  Filled 2015-04-23: qty 2
  Filled 2015-04-23: qty 1
  Filled 2015-04-23 (×4): qty 2

## 2015-04-23 MED ORDER — MIDAZOLAM HCL 5 MG/5ML IJ SOLN
INTRAMUSCULAR | Status: DC | PRN
  Administered 2015-04-23: 2 mg via INTRAVENOUS

## 2015-04-23 MED ORDER — SUGAMMADEX SODIUM 200 MG/2ML IV SOLN
INTRAVENOUS | Status: AC
  Filled 2015-04-23: qty 2

## 2015-04-23 MED ORDER — HEMOSTATIC AGENTS (NO CHARGE) OPTIME
TOPICAL | Status: DC | PRN
  Administered 2015-04-23: 1 via TOPICAL

## 2015-04-23 MED ORDER — ENOXAPARIN SODIUM 40 MG/0.4ML ~~LOC~~ SOLN
40.0000 mg | SUBCUTANEOUS | Status: DC
Start: 2015-04-24 — End: 2015-04-25
  Administered 2015-04-24: 40 mg via SUBCUTANEOUS
  Filled 2015-04-23: qty 0.4

## 2015-04-23 MED ORDER — MORPHINE SULFATE (PF) 2 MG/ML IV SOLN
2.0000 mg | INTRAVENOUS | Status: DC | PRN
  Administered 2015-04-23 (×2): 2 mg via INTRAVENOUS
  Filled 2015-04-23 (×2): qty 1

## 2015-04-23 MED ORDER — FENTANYL CITRATE (PF) 100 MCG/2ML IJ SOLN
INTRAMUSCULAR | Status: DC | PRN
  Administered 2015-04-23 (×5): 50 ug via INTRAVENOUS
  Administered 2015-04-23: 25 ug via INTRAVENOUS
  Administered 2015-04-23: 50 ug via INTRAVENOUS
  Administered 2015-04-23: 25 ug via INTRAVENOUS

## 2015-04-23 SURGICAL SUPPLY — 56 items
APL SKNCLS STERI-STRIP NONHPOA (GAUZE/BANDAGES/DRESSINGS) ×1
APPLIER CLIP 5 13 M/L LIGAMAX5 (MISCELLANEOUS) ×4
APR CLP MED LRG 5 ANG JAW (MISCELLANEOUS) ×2
ARISTA AH ×1 IMPLANT
ARISTA AH FLEXITIP XL APPLICATOR ×1 IMPLANT
BAG SPEC RTRVL 10 TROC 200 (ENDOMECHANICALS) ×1
BAG SPEC RTRVL LRG 6X4 10 (ENDOMECHANICALS)
BENZOIN TINCTURE PRP APPL 2/3 (GAUZE/BANDAGES/DRESSINGS) ×2 IMPLANT
CANISTER SUCTION 2500CC (MISCELLANEOUS) ×2 IMPLANT
CHLORAPREP W/TINT 26ML (MISCELLANEOUS) ×2 IMPLANT
CLIP APPLIE 5 13 M/L LIGAMAX5 (MISCELLANEOUS) IMPLANT
CLIP LIGATING HEMO O LOK GREEN (MISCELLANEOUS) ×2 IMPLANT
COVER MAYO STAND STRL (DRAPES) ×2 IMPLANT
COVER SURGICAL LIGHT HANDLE (MISCELLANEOUS) ×2 IMPLANT
COVER TRANSDUCER ULTRASND (DRAPES) IMPLANT
DEVICE TROCAR PUNCTURE CLOSURE (ENDOMECHANICALS) ×2 IMPLANT
DISSECTOR BLUNT TIP ENDO 5MM (MISCELLANEOUS) ×1 IMPLANT
DRAPE C-ARM 42X72 X-RAY (DRAPES) ×2 IMPLANT
DRSG TEGADERM 2-3/8X2-3/4 SM (GAUZE/BANDAGES/DRESSINGS) ×1 IMPLANT
DRSG TEGADERM 4X4.75 (GAUZE/BANDAGES/DRESSINGS) ×2 IMPLANT
ELECT REM PT RETURN 9FT ADLT (ELECTROSURGICAL) ×2
ELECTRODE REM PT RTRN 9FT ADLT (ELECTROSURGICAL) ×1 IMPLANT
GAUZE SPONGE 2X2 8PLY STRL LF (GAUZE/BANDAGES/DRESSINGS) ×1 IMPLANT
GLOVE BIO SURGEON STRL SZ7.5 (GLOVE) ×2 IMPLANT
GOWN STRL REUS W/ TWL LRG LVL3 (GOWN DISPOSABLE) ×2 IMPLANT
GOWN STRL REUS W/ TWL XL LVL3 (GOWN DISPOSABLE) ×1 IMPLANT
GOWN STRL REUS W/TWL LRG LVL3 (GOWN DISPOSABLE) ×4
GOWN STRL REUS W/TWL XL LVL3 (GOWN DISPOSABLE) ×2
HEMOSTAT ARISTA ABSORB 3G PWDR (MISCELLANEOUS) ×1 IMPLANT
IV CATH 14GX2 1/4 (CATHETERS) ×2 IMPLANT
KIT BASIN OR (CUSTOM PROCEDURE TRAY) ×2 IMPLANT
KIT ROOM TURNOVER OR (KITS) ×2 IMPLANT
NEEDLE INSUFFLATION 14GA 120MM (NEEDLE) ×2 IMPLANT
NS IRRIG 1000ML POUR BTL (IV SOLUTION) ×2 IMPLANT
PAD ARMBOARD 7.5X6 YLW CONV (MISCELLANEOUS) ×4 IMPLANT
POUCH RETRIEVAL ECOSAC 10 (ENDOMECHANICALS) IMPLANT
POUCH RETRIEVAL ECOSAC 10MM (ENDOMECHANICALS) ×1
POUCH SPECIMEN RETRIEVAL 10MM (ENDOMECHANICALS) IMPLANT
SCISSORS LAP 5X35 DISP (ENDOMECHANICALS) ×2 IMPLANT
SET CHOLANGIOGRAPHY FRANKLIN (SET/KITS/TRAYS/PACK) ×2 IMPLANT
SET IRRIG TUBING LAPAROSCOPIC (IRRIGATION / IRRIGATOR) ×2 IMPLANT
SLEEVE ENDOPATH XCEL 5M (ENDOMECHANICALS) ×2 IMPLANT
SPECIMEN JAR SMALL (MISCELLANEOUS) ×2 IMPLANT
SPONGE GAUZE 2X2 STER 10/PKG (GAUZE/BANDAGES/DRESSINGS) ×1
SUT MNCRL AB 3-0 PS2 18 (SUTURE) ×2 IMPLANT
SUT VIC AB 2-0 CT1 27 (SUTURE) ×2
SUT VIC AB 2-0 CT1 TAPERPNT 27 (SUTURE) ×1 IMPLANT
SUT VIC AB 2-0 SH 27 (SUTURE) ×2
SUT VIC AB 2-0 SH 27XBRD (SUTURE) IMPLANT
SUT VICRYL 0 UR6 27IN ABS (SUTURE) ×1 IMPLANT
TOWEL OR 17X24 6PK STRL BLUE (TOWEL DISPOSABLE) ×2 IMPLANT
TOWEL OR 17X26 10 PK STRL BLUE (TOWEL DISPOSABLE) ×2 IMPLANT
TRAY LAPAROSCOPIC MC (CUSTOM PROCEDURE TRAY) ×2 IMPLANT
TROCAR XCEL NON-BLD 11X100MML (ENDOMECHANICALS) ×2 IMPLANT
TROCAR XCEL NON-BLD 5MMX100MML (ENDOMECHANICALS) ×2 IMPLANT
TUBING INSUFFLATION (TUBING) ×2 IMPLANT

## 2015-04-23 NOTE — Anesthesia Postprocedure Evaluation (Signed)
Anesthesia Post Note  Patient: Jeff Rodriguez  Procedure(s) Performed: Procedure(s) (LRB): LAPAROSCOPIC CHOLECYSTECTOMY WITH INTRAOPERATIVE CHOLANGIOGRAM (N/A)  Patient location during evaluation: PACU Anesthesia Type: General Level of consciousness: awake Pain management: pain level controlled Vital Signs Assessment: post-procedure vital signs reviewed and stable Respiratory status: spontaneous breathing Cardiovascular status: stable Anesthetic complications: no    Last Vitals:  Filed Vitals:   04/23/15 1645 04/23/15 1702  BP:  148/77  Pulse: 91 87  Temp: 36.7 C 36.8 C  Resp: 15 16    Last Pain:  Filed Vitals:   04/23/15 1703  PainSc: 3                  EDWARDS,Lindsie Simar

## 2015-04-23 NOTE — Progress Notes (Signed)
PATIENT DETAILS Name: Jeff Rodriguez Age: 70 y.o. Sex: male Date of Birth: 09-06-1945 Admit Date: 04/20/2015 Admitting Physician Waldemar Dickens, MD PCP:No primary care provider on file.   Subjective: Mild RUQ pain.  Assessment/Plan: Active Problems: Acute cholecystitis: Initially presented with lower chest pain/epigastric pain, underwent a nuclear stress test that was negative. Subsequently started developing right upper quadrant pain while hospitalized, RUQ ultrasound positive for cholelithiasis with mild gallbladder wall thickening. Empirically started on Zosyn, general surgery was consulted, patient is scheduled for a laparoscopic cholecystectomy 3/2.  Type 2 diabetes:  continues to have hypoglycemia in the morning, was nothing by mouth-decrease Lantus further down to 25 units. Follow CBGs and adjust accordingly.   Hypertension: Controlled, continue lisinopril. Follow  Dyslipidemia: Continue statin  BPH: Continue Flomax.  Disposition: Remain inpatient-home in the next 1-2 days.   Antimicrobial agents  See below  Anti-infectives    Start     Dose/Rate Route Frequency Ordered Stop   04/22/15 1200  [MAR Hold]  piperacillin-tazobactam (ZOSYN) IVPB 3.375 g     (MAR Hold since 04/23/15 1146)   3.375 g 12.5 mL/hr over 240 Minutes Intravenous 3 times per day 04/22/15 1102     04/22/15 1100  Ampicillin-Sulbactam (UNASYN) 3 g in sodium chloride 0.9 % 100 mL IVPB  Status:  Discontinued     3 g 100 mL/hr over 60 Minutes Intravenous Every 6 hours 04/22/15 1008 04/22/15 1100      DVT Prophylaxis: Prophylactic Lovenox  Code Status: Full code   Family Communication Spouse at bedside  Procedures: None  CONSULTS:  cardiology   General surgery  Time spent 25 minutes-Greater than 50% of this time was spent in counseling, explanation of diagnosis, planning of further management, and coordination of care.  MEDICATIONS: Scheduled Meds: . [MAR Hold]  aspirin  81 mg Oral Daily  . [MAR Hold] enoxaparin (LOVENOX) injection  40 mg Subcutaneous Q24H  . [MAR Hold] insulin aspart  0-9 Units Subcutaneous TID WC  . [MAR Hold] insulin glargine  45 Units Subcutaneous QHS  . [MAR Hold] lisinopril  10 mg Oral Daily  . [MAR Hold] piperacillin-tazobactam (ZOSYN)  IV  3.375 g Intravenous 3 times per day  . [MAR Hold] regadenoson  0.4 mg Intravenous Once  . [MAR Hold] simvastatin  20 mg Oral Daily  . [MAR Hold] tamsulosin  0.4 mg Oral BID   Continuous Infusions: . sodium chloride 75 mL/hr at 04/23/15 0432  . lactated ringers 10 mL/hr at 04/23/15 1155   PRN Meds:.[MAR Hold] acetaminophen, [MAR Hold] gi cocktail, [MAR Hold]  morphine injection, [MAR Hold] ondansetron (ZOFRAN) IV    PHYSICAL EXAM: Vital signs in last 24 hours: Filed Vitals:   04/22/15 1500 04/22/15 2116 04/23/15 0349 04/23/15 0941  BP: 125/61 139/64 132/56 98/56  Pulse: 76 82 79 71  Temp: 98.5 F (36.9 C) 99.3 F (37.4 C) 100.2 F (37.9 C) 98.7 F (37.1 C)  TempSrc: Oral Oral Oral Oral  Resp:  18 18 16   Height:      Weight:      SpO2: 97% 98% 97% 99%    Weight change:  Filed Weights   04/20/15 2034 04/21/15 1900  Weight: 90.9 kg (200 lb 6.4 oz) 90.719 kg (200 lb)   Body mass index is 30.42 kg/(m^2).   Gen Exam: Awake and alert with clear speech.   Neck: Supple, No JVD.   Chest: B/L Clear.  no rales or rhonchi. CVS: S1 S2 Regular, no murmurs.  Abdomen: soft, BS +, Mildly tender in the upper abdomen without any rebound rigidity or guarding.Non distended.  Extremities: no edema, lower extremities warm to touch. Neurologic: Non Focal.   Skin: No Rash.   Wounds: N/A.   Intake/Output from previous day:  Intake/Output Summary (Last 24 hours) at 04/23/15 1256 Last data filed at 04/23/15 0916  Gross per 24 hour  Intake    575 ml  Output      0 ml  Net    575 ml     LAB RESULTS: CBC  Recent Labs Lab 04/20/15 0523 04/20/15 0538 04/20/15 0955  04/21/15 1018 04/22/15 0254 04/23/15 0400  WBC 12.7*  --  18.1* 22.7* 21.4* 15.2*  HGB 14.0 15.6 13.8 12.3* 11.8* 11.4*  HCT 40.7 46.0 39.6 35.2* 35.4* 33.9*  PLT 238  --  212 191 187 178  MCV 90.0  --  90.8 91.0 91.0 90.4  MCH 31.0  --  31.7 31.8 30.3 30.4  MCHC 34.4  --  34.8 34.9 33.3 33.6  RDW 13.9  --  13.8 14.3 14.2 13.9  LYMPHSABS  --   --   --  2.7 2.1  --   MONOABS  --   --   --  2.3* 2.0*  --   EOSABS  --   --   --  0.0 0.0  --   BASOSABS  --   --   --  0.0 0.0  --     Chemistries   Recent Labs Lab 04/20/15 0523 04/20/15 0538 04/21/15 1018 04/22/15 0254 04/23/15 0400  NA 138 139 141 138 136  K 3.7 4.1 3.5 2.8* 4.1  CL 102 104 106 102 104  CO2 24  --  26 25 25   GLUCOSE 181* 180* 81 50* 80  BUN 15 20 18 16 12   CREATININE 0.81 0.70 0.98 0.78 0.89  CALCIUM 9.1  --  8.3* 8.5* 8.4*    CBG:  Recent Labs Lab 04/22/15 1135 04/22/15 1650 04/22/15 2114 04/23/15 0649 04/23/15 0814  GLUCAP 72 165* 91 56* 85    GFR Estimated Creatinine Clearance: 85.6 mL/min (by C-G formula based on Cr of 0.89).  Coagulation profile  Recent Labs Lab 04/22/15 0940  INR 1.30    Cardiac Enzymes  Recent Labs Lab 04/20/15 0955 04/20/15 1522  TROPONINI <0.03 0.04*    Invalid input(s): POCBNP No results for input(s): DDIMER in the last 72 hours. No results for input(s): HGBA1C in the last 72 hours. No results for input(s): CHOL, HDL, LDLCALC, TRIG, CHOLHDL, LDLDIRECT in the last 72 hours. No results for input(s): TSH, T4TOTAL, T3FREE, THYROIDAB in the last 72 hours.  Invalid input(s): FREET3 No results for input(s): VITAMINB12, FOLATE, FERRITIN, TIBC, IRON, RETICCTPCT in the last 72 hours.  Recent Labs  04/22/15 0940 04/23/15 0400  LIPASE 19 24    Urine Studies No results for input(s): UHGB, CRYS in the last 72 hours.  Invalid input(s): UACOL, UAPR, USPG, UPH, UTP, UGL, UKET, UBIL, UNIT, UROB, ULEU, UEPI, UWBC, URBC, UBAC, CAST, UCOM,  BILUA  MICROBIOLOGY: Recent Results (from the past 240 hour(s))  Culture, blood (routine x 2)     Status: None (Preliminary result)   Collection Time: 04/21/15  5:20 PM  Result Value Ref Range Status   Specimen Description BLOOD LEFT ARM  Final   Special Requests IN PEDIATRIC BOTTLE 3CC  Final   Culture NO GROWTH < 24 HOURS  Final   Report Status PENDING  Incomplete  Culture, blood (routine x 2)     Status: None (Preliminary result)   Collection Time: 04/21/15  5:30 PM  Result Value Ref Range Status   Specimen Description BLOOD LEFT HAND  Final   Special Requests IN PEDIATRIC BOTTLE 2CC  Final   Culture NO GROWTH < 24 HOURS  Final   Report Status PENDING  Incomplete  Surgical PCR screen     Status: None   Collection Time: 04/23/15  3:51 AM  Result Value Ref Range Status   MRSA, PCR NEGATIVE NEGATIVE Final   Staphylococcus aureus NEGATIVE NEGATIVE Final    Comment:        The Xpert SA Assay (FDA approved for NASAL specimens in patients over 2 years of age), is one component of a comprehensive surveillance program.  Test performance has been validated by [facility] for patients greater than or equal to 45 year old. It is not intended to diagnose infection nor to guide or monitor treatment.     RADIOLOGY STUDIES/RESULTS: Nm Myocar Multi W/spect W/wall Motion / Ef  04/21/2015   There was no ST segment deviation noted during stress.  The study is normal.  This is a low risk study.  The left ventricular ejection fraction is normal (55-65%).  Normal resting and stress perfusion no ischemia or infarction EF 61%   Dg Chest Portable 1 View  04/20/2015  CLINICAL DATA:  Acute onset of generalized chest pressure. Initial encounter. EXAM: PORTABLE CHEST 1 VIEW COMPARISON:  Chest radiograph performed 06/08/2007 FINDINGS: The lungs are well-aerated and clear. There is no evidence of focal opacification, pleural effusion or pneumothorax. The cardiomediastinal silhouette is within  normal limits. No acute osseous abnormalities are seen. IMPRESSION: No acute cardiopulmonary process seen. Electronically Signed   By: Garald Balding M.D.   On: 04/20/2015 05:38   Ct Angio Chest Aorta W/cm &/or Wo/cm  04/20/2015  CLINICAL DATA:  Chest pain radiating to back EXAM: CT ANGIOGRAPHY CHEST WITH CONTRAST TECHNIQUE: Initially, axial CT images were obtained through the chest without intravenous contrast material administration. Multidetector CT imaging of the chest was performed using the standard protocol during bolus administration of intravenous contrast. Multiplanar CT image reconstructions and MIPs were obtained to evaluate the vascular anatomy. CONTRAST:  152mL OMNIPAQUE IOHEXOL 350 MG/ML SOLN COMPARISON:  Chest radiograph April 20, 2015 ; CT abdomen and pelvis including lung bases June 04, 2010 FINDINGS: Mediastinum/Lymph Nodes: There is no appreciable thoracic aortic aneurysm or thoracic aortic dissection. The ascending thoracic aortic diameter is 3.8 x 3.7 cm. The visualized great vessels appear normal. Note that the right and left common carotid arteries arise as a common trunk, an anatomic variant. There is no demonstrable pulmonary embolus. The pericardium is not thickened. There are scattered foci of coronary artery calcification. There is left ventricular hypertrophy. Visualized thyroid appears unremarkable. There is no thoracic aortic aneurysm. There is a focal hiatal hernia. Lungs/Pleura: There is no parenchymal lung edema or consolidation. Slight bibasilar atelectasis is present, probably dependent in etiology. Upper abdomen: In the visualized upper abdomen, there is cholelithiasis. There is a degree of hepatic steatosis. The visualized upper abdominal structures otherwise appear unremarkable. Musculoskeletal: There are no blastic or lytic bone lesions. No intramuscular lesions are identified. Review of the MIP images confirms the above findings. IMPRESSION: No demonstrable thoracic  aortic aneurysm or dissection. No pulmonary embolus evident. There are several foci of coronary artery calcification. There is left ventricular hypertrophy. The pericardium  is not appreciably thickened. No parenchymal lung edema or consolidation. Rather minimal bibasilar atelectasis, like dependent in etiology. No appreciable thoracic adenopathy. Cholelithiasis. Focal hiatal hernia present. Electronically Signed   By: Lowella Grip III M.D.   On: 04/20/2015 07:37   US Abdomen Limited Ruq  04/21/2015  CLINICAL DATA:  Acute right upper quadrant abdominal pain EXAM: US ABDOMEN LIMITED - RIGHT UPPER QUADRANT COMPARISON:  06/04/2010, 04/20/2015 FINDINGS: Gallbladder: Echogenic shadowing gallstones within the gallbladder close to the neck area some of which are mobile. Wall thickness measures 4.3 mm, mildly thickened. Despite this, there is no sonographic Murphy sign elicited over the gallbladder during the exam. Common bile duct: Diameter: 3.7 mm, limited visualization. Liver: Diffuse increased echogenicity compatible with hepatic steatosis or fatty infiltration. Patent hepatic and portal veins with normal directional flow. No intrahepatic biliary dilatation or definite focal hepatic abnormality. No upper abdominal free fluid. Included views of the right kidney demonstrate no hydronephrosis. IMPRESSION: Cholelithiasis with mild gallbladder wall thickening, nonspecific. No sonographic Murphy sign elicited during the study. No biliary dilatation Hepatic steatosis Electronically Signed   By: Jerilynn Mages.  Shick M.D.   On: 04/21/2015 17:14    Oren Binet, MD  [facility] Pager:[phone]  If 7PM-7AM, please contact night-coverage [email or web address] Password [email or web address] 04/23/2015, 12:56 PM   LOS: 1 day

## 2015-04-23 NOTE — Progress Notes (Signed)
Pt received from PACU RN. Pt and wife updated. Call light within reach. Will continue to monitor  Fritz Pickerel, RN

## 2015-04-23 NOTE — Progress Notes (Signed)
Pt says he feels "funny", sl diaphoretic, CBG 81 . Dr Oletta Lamas updated & new order for 1/2half amp D50. Given, and pt drinking reg ginger ale. Will cont to monitor.

## 2015-04-23 NOTE — Progress Notes (Signed)
  Subjective: Pt doing well this AM.  Still some abd wall soreness.  Objective: Vital signs in last 24 hours: Temp:  [98.5 F (36.9 C)-100.2 F (37.9 C)] 100.2 F (37.9 C) (03/02 0349) Pulse Rate:  [76-82] 79 (03/02 0349) Resp:  [18] 18 (03/02 0349) BP: (125-140)/(56-69) 132/56 mmHg (03/02 0349) SpO2:  [97 %-98 %] 97 % (03/02 0349) Last BM Date: 04/22/15  Intake/Output from previous day: 03/01 0701 - 03/02 0700 In: 575 [I.V.:525; IV Piggyback:50] Out: -  Intake/Output this shift:    General appearance: alert and cooperative GI: soft, approp ttp RUQ, ND  Lab Results:   Recent Labs  04/22/15 0254 04/23/15 0400  WBC 21.4* 15.2*  HGB 11.8* 11.4*  HCT 35.4* 33.9*  PLT 187 178   BMET  Recent Labs  04/22/15 0254 04/23/15 0400  NA 138 136  K 2.8* 4.1  CL 102 104  CO2 25 25  GLUCOSE 50* 80  BUN 16 12  CREATININE 0.78 0.89  CALCIUM 8.5* 8.4*   PT/INR  Recent Labs  04/22/15 0940  LABPROT 16.3*  INR 1.30   ABG No results for input(s): PHART, HCO3 in the last 72 hours.  Invalid input(s): PCO2, PO2  Studies/Results: Nm Myocar Multi W/spect W/wall Motion / Ef  04/21/2015   There was no ST segment deviation noted during stress.  The study is normal.  This is a low risk study.  The left ventricular ejection fraction is normal (55-65%).  Normal resting and stress perfusion no ischemia or infarction EF 61%   US Abdomen Limited Ruq  04/21/2015  CLINICAL DATA:  Acute right upper quadrant abdominal pain EXAM: US ABDOMEN LIMITED - RIGHT UPPER QUADRANT COMPARISON:  06/04/2010, 04/20/2015 FINDINGS: Gallbladder: Echogenic shadowing gallstones within the gallbladder close to the neck area some of which are mobile. Wall thickness measures 4.3 mm, mildly thickened. Despite this, there is no sonographic Murphy sign elicited over the gallbladder during the exam. Common bile duct: Diameter: 3.7 mm, limited visualization. Liver: Diffuse increased echogenicity compatible with  hepatic steatosis or fatty infiltration. Patent hepatic and portal veins with normal directional flow. No intrahepatic biliary dilatation or definite focal hepatic abnormality. No upper abdominal free fluid. Included views of the right kidney demonstrate no hydronephrosis. IMPRESSION: Cholelithiasis with mild gallbladder wall thickening, nonspecific. No sonographic Murphy sign elicited during the study. No biliary dilatation Hepatic steatosis Electronically Signed   By: Jerilynn Mages.  Shick M.D.   On: 04/21/2015 17:14    Anti-infectives: Anti-infectives    Start     Dose/Rate Route Frequency Ordered Stop   04/22/15 1200  piperacillin-tazobactam (ZOSYN) IVPB 3.375 g     3.375 g 12.5 mL/hr over 240 Minutes Intravenous 3 times per day 04/22/15 1102     04/22/15 1100  Ampicillin-Sulbactam (UNASYN) 3 g in sodium chloride 0.9 % 100 mL IVPB  Status:  Discontinued     3 g 100 mL/hr over 60 Minutes Intravenous Every 6 hours 04/22/15 1008 04/22/15 1100      Assessment/Plan: s/p Procedure(s): LAPAROSCOPIC CHOLECYSTECTOMY WITH INTRAOPERATIVE CHOLANGIOGRAM (N/A) To OR for Lap chole today All risks and benefits were discussed with the patient to generally include: infection, bleeding, possible need for post op ERCP, damage to the bile ducts, and bile leak. Alternatives were offered and described.  All questions were answered and the patient voiced understanding of the procedure and wishes to proceed at this point with a laparoscopic cholecystectomy    LOS: 1 day    Rosario Jacks., [facility] 04/23/2015

## 2015-04-23 NOTE — Op Note (Signed)
04/23/2015  3:05 PM  PATIENT:  Jeff Rodriguez  70 y.o. male  PRE-OPERATIVE DIAGNOSIS:  Cholecystitis/cholelithiasis  POST-OPERATIVE DIAGNOSIS:  Cholecystitis/cholelithiasis/Duct of Lushcka  PROCEDURE:  Procedure(s): LAPAROSCOPIC CHOLECYSTECTOMY WITH INTRAOPERATIVE CHOLANGIOGRAM (N/A)  SURGEON:  Surgeon(s) and Role:    * Ralene Ok, MD - Primary  ANESTHESIA:   local and general  EBL:  Total I/O In: 1000 [I.V.:1000] Out: 30 [Blood:30]  BLOOD ADMINISTERED:none  DRAINS: none   LOCAL MEDICATIONS USED:  BUPIVICAINE   SPECIMEN:  Source of Specimen:  gallbladder  DISPOSITION OF SPECIMEN:  PATHOLOGY  COUNTS:  YES  TOURNIQUET:  * No tourniquets in log *  DICTATION: .Dragon Dictation The patient was taken to the operating and placed in the supine position with bilateral SCDs in place. The patient was prepped and draped in the usual sterile fashion. A time out was called and all facts were verified. A pneumoperitoneum was obtained via A Veress needle technique to a pressure of 52mm of mercury.  A 48mm trochar was then placed in the right upper quadrant under visualization, and there were no injuries to any abdominal organs. A 11 mm port was then placed in the umbilical region after infiltrating with local anesthesia under direct visualization. A second and third epigastric port and right lower quadrant port placement under direct visualization, respectively. The liver was very fatty and large. The gallbladder was identified and retracted, the peritoneum was then sharply dissected from the gallbladder and this dissection was carried down to Calot's triangle. The gallbladder was identified and stripped away circumferentially and seen going into the gallbladder 360, the critical angle was obtained.   A Cook catheter was used to perform an intraoperative cholangiogram. Cholangiogram showed the common bile duct with no stones. The contrast emptied into the duodenum appropriately.  There is no stones could be seen within the hepatic duct. The The  2 clips were placed proximally one distally and the cystic duct transected. The cystic artery was identified and 2 clips placed proximally and one distally and transected. We then proceeded to remove the gallbladder off the hepatic fossa with Bovie cautery. A retrieval bag was then placed in the abdomen and gallbladder placed in the bag. The hepatic fossa was then reexamined and hemostasis was achieved with Bovie cautery . Arixta was placed in the gallbladder fossa.   Upon visualizing for hemostasis it was apparent that there was a duct of Luschka at the liver edge. Several clips were attempted to be placed however unsuccessfully. At this time a 2-0 Vicryl was used in a figure-of-eight fashion to try to ligate the duct. This appeared successful as there was no bile that could be seen. The subhepatic fossa and perihepatic fossa was then irrigated until the effluent was clear. The 11 mm trocar fascia was reapproximated with the Endo Close #1 Vicryl 3 . The pneumoperitoneum was evacuated and all trochars removed under direct visulalization. The skin was then closed with 4-0 Monocryl and the skin dressed with Steri-Strips, gauze, and tape. The patient was awaken from general anesthesia and taken to the recovery room in stable condition.   PLAN OF CARE: Admit to inpatient   PATIENT DISPOSITION:  PACU - hemodynamically stable.   Delay start of Pharmacological VTE agent (>24hrs) due to surgical blood loss or risk of bleeding: yes

## 2015-04-23 NOTE — Anesthesia Procedure Notes (Signed)
Procedure Name: Intubation Date/Time: 04/23/2015 1:03 PM Performed by: Jenne Campus Pre-anesthesia Checklist: Patient identified, Emergency Drugs available, Suction available, Patient being monitored and Timeout performed Patient Re-evaluated:Patient Re-evaluated prior to inductionOxygen Delivery Method: Circle system utilized Preoxygenation: Pre-oxygenation with 100% oxygen Intubation Type: IV induction Ventilation: Mask ventilation without difficulty and Oral airway inserted - appropriate to patient size Laryngoscope Size: Miller and 2 Grade View: Grade I Tube type: Oral Tube size: 7.5 mm Number of attempts: 1 Airway Equipment and Method: Stylet Placement Confirmation: ETT inserted through vocal cords under direct vision,  positive ETCO2,  CO2 detector and breath sounds checked- equal and bilateral Secured at: 22 cm Tube secured with: Tape Dental Injury: Teeth and Oropharynx as per pre-operative assessment

## 2015-04-23 NOTE — Anesthesia Preprocedure Evaluation (Addendum)
Anesthesia Evaluation  Patient identified by MRN, date of birth, ID band Patient awake    Reviewed: Allergy & Precautions, NPO status , Patient's Chart, lab work & pertinent test results  Airway Mallampati: II  TM Distance: >3 FB Neck ROM: Full    Dental  (+) Teeth Intact, Dental Advisory Given   Pulmonary asthma ,    breath sounds clear to auscultation       Cardiovascular hypertension,  Rhythm:Regular Rate:Normal     Neuro/Psych    GI/Hepatic negative GI ROS, Neg liver ROS,   Endo/Other  diabetes  Renal/GU negative Renal ROS     Musculoskeletal   Abdominal   Peds  Hematology   Anesthesia Other Findings   Reproductive/Obstetrics                           Anesthesia Physical Anesthesia Plan  ASA: III  Anesthesia Plan: General   Post-op Pain Management:    Induction: Intravenous  Airway Management Planned: Oral ETT  Additional Equipment:   Intra-op Plan:   Post-operative Plan: Extubation in OR  Informed Consent: I have reviewed the patients History and Physical, chart, labs and discussed the procedure including the risks, benefits and alternatives for the proposed anesthesia with the patient or authorized representative who has indicated his/her understanding and acceptance.   Dental advisory given  Plan Discussed with: CRNA and Anesthesiologist  Anesthesia Plan Comments:         Anesthesia Quick Evaluation

## 2015-04-23 NOTE — Transfer of Care (Signed)
Immediate Anesthesia Transfer of Care Note  Patient: Jeff Rodriguez  Procedure(s) Performed: Procedure(s): LAPAROSCOPIC CHOLECYSTECTOMY WITH INTRAOPERATIVE CHOLANGIOGRAM (N/A)  Patient Location: PACU  Anesthesia Type:General  Level of Consciousness: awake, oriented and patient cooperative  Airway & Oxygen Therapy: Patient Spontanous Breathing and Patient connected to face mask oxygen  Post-op Assessment: Report given to RN and Post -op Vital signs reviewed and stable  Post vital signs: Reviewed  Last Vitals:  Filed Vitals:   04/23/15 0349 04/23/15 0941  BP: 132/56 98/56  Pulse: 79 71  Temp: 37.9 C 37.1 C  Resp: 18 16    Complications: No apparent anesthesia complications

## 2015-04-24 ENCOUNTER — Encounter (HOSPITAL_COMMUNITY): Payer: Self-pay | Admitting: General Surgery

## 2015-04-24 ENCOUNTER — Inpatient Hospital Stay (HOSPITAL_COMMUNITY): Payer: Medicare Other

## 2015-04-24 DIAGNOSIS — K810 Acute cholecystitis: Secondary | ICD-10-CM | POA: Diagnosis not present

## 2015-04-24 LAB — COMPREHENSIVE METABOLIC PANEL
ALBUMIN: 2.5 g/dL — AB (ref 3.5–5.0)
ALT: 211 U/L — ABNORMAL HIGH (ref 17–63)
ANION GAP: 11 (ref 5–15)
AST: 305 U/L — ABNORMAL HIGH (ref 15–41)
Alkaline Phosphatase: 156 U/L — ABNORMAL HIGH (ref 38–126)
BUN: 13 mg/dL (ref 6–20)
CO2: 22 mmol/L (ref 22–32)
Calcium: 8.1 mg/dL — ABNORMAL LOW (ref 8.9–10.3)
Chloride: 99 mmol/L — ABNORMAL LOW (ref 101–111)
Creatinine, Ser: 0.76 mg/dL (ref 0.61–1.24)
GFR calc Af Amer: >60 mL/min (ref >60)
GFR calc non Af Amer: >60 mL/min (ref >60)
GLUCOSE: 159 mg/dL — AB (ref 65–99)
POTASSIUM: 3.7 mmol/L (ref 3.5–5.1)
SODIUM: 132 mmol/L — AB (ref 135–145)
TOTAL PROTEIN: 6.6 g/dL (ref 6.5–8.1)
Total Bilirubin: 3.6 mg/dL — ABNORMAL HIGH (ref 0.3–1.2)

## 2015-04-24 LAB — URINE CULTURE: SPECIAL REQUESTS: NORMAL

## 2015-04-24 LAB — CBC
HEMATOCRIT: 33.9 % — AB (ref 39.0–52.0)
HEMOGLOBIN: 11.4 g/dL — AB (ref 13.0–17.0)
MCH: 30.1 pg (ref 26.0–34.0)
MCHC: 33.6 g/dL (ref 30.0–36.0)
MCV: 89.4 fL (ref 78.0–100.0)
Platelets: 197 10*3/uL (ref 150–400)
RBC: 3.79 MIL/uL — ABNORMAL LOW (ref 4.22–5.81)
RDW: 13.6 % (ref 11.5–15.5)
WBC: 11 10*3/uL — AB (ref 4.0–10.5)

## 2015-04-24 LAB — GLUCOSE, CAPILLARY
GLUCOSE-CAPILLARY: 104 mg/dL — AB (ref 65–99)
GLUCOSE-CAPILLARY: 156 mg/dL — AB (ref 65–99)
GLUCOSE-CAPILLARY: 206 mg/dL — AB (ref 65–99)
Glucose-Capillary: 131 mg/dL — ABNORMAL HIGH (ref 65–99)

## 2015-04-24 MED ORDER — TECHNETIUM TC 99M MEBROFENIN IV KIT
5.2000 | PACK | Freq: Once | INTRAVENOUS | Status: AC | PRN
  Administered 2015-04-24: 5 via INTRAVENOUS

## 2015-04-24 NOTE — Progress Notes (Addendum)
PATIENT DETAILS Name: Jeff Rodriguez Age: 70 y.o. Sex: male Date of Birth: 12-11-1945 Admit Date: 04/20/2015 Admitting Physician Waldemar Dickens, MD PCP:No primary care provider on file.  Brief narrative 70 year old Caucasian male with history of diabetes, hypertension, dyslipidemia initially presented with lower chest pain/epigastric pain. Initially thought to have cardiac etiology, however upon further evaluation patient was found to have acute calculus cholecystitis. Patient underwent a nuclear stress test, that was negative, subsequently underwent a laparoscopic cholecystectomy on 04/23/15.  Subjective: Mild pain at the operative site.  Assessment/Plan: Active Problems: Acute cholecystitis: Initially presented with lower chest pain/epigastric pain, underwent a nuclear stress test that was negative. Subsequently started developing right upper quadrant pain while hospitalized, RUQ ultrasound positive for cholelithiasis with mild gallbladder wall thickening. Empirically started on Zosyn, general surgery was consulted, underwent laparoscopic cholecystectomy 3/2. Postoperatively, found to have significantly elevated LFTs, general surgery subsequently ordered a HIDA scan that was negative for a biliary leak. Gen. surgery, recommends continued observation for another 24 hours to see if LFTs improve further prior to discharge. Per general surgery, okay to discontinue Zosyn today.  Type 2 diabetes:  Did have hypoglycemia for the past 2 days-hence Lantus was decreased down to 15 units. CBGs currently stable, suspect as diet is advanced, will require further adjustment of Lantus dosing.   Hypertension: Controlled, continue lisinopril. Follow  Dyslipidemia: Hold statin-given elevated LFTs-resume on discharge if LFTs are down trending.   BPH: Continue Flomax.  Disposition: Remain inpatient-home tomorrow if ok with gen surgery  Antimicrobial agents  See  below  Anti-infectives    Start     Dose/Rate Route Frequency Ordered Stop   04/22/15 1200  piperacillin-tazobactam (ZOSYN) IVPB 3.375 g     3.375 g 12.5 mL/hr over 240 Minutes Intravenous 3 times per day 04/22/15 1102     04/22/15 1100  Ampicillin-Sulbactam (UNASYN) 3 g in sodium chloride 0.9 % 100 mL IVPB  Status:  Discontinued     3 g 100 mL/hr over 60 Minutes Intravenous Every 6 hours 04/22/15 1008 04/22/15 1100      DVT Prophylaxis: Prophylactic Lovenox  Code Status: Full code   Family Communication None at bedside  Procedures: None  CONSULTS:  cardiology   General surgery  Time spent 25 minutes-Greater than 50% of this time was spent in counseling, explanation of diagnosis, planning of further management, and coordination of care.  MEDICATIONS: Scheduled Meds: . aspirin  81 mg Oral Daily  . enoxaparin (LOVENOX) injection  40 mg Subcutaneous Q24H  . finasteride  5 mg Oral Daily  . insulin aspart  0-9 Units Subcutaneous TID WC  . insulin glargine  15 Units Subcutaneous QHS  . lisinopril  10 mg Oral Daily  . piperacillin-tazobactam (ZOSYN)  IV  3.375 g Intravenous 3 times per day  . simvastatin  20 mg Oral Daily  . tamsulosin  0.4 mg Oral BID   Continuous Infusions: . sodium chloride 75 mL/hr at 04/24/15 1256  . lactated ringers Stopped (04/23/15 1716)   PRN Meds:.acetaminophen, gi cocktail, morphine injection, ondansetron (ZOFRAN) IV, oxyCODONE-acetaminophen    PHYSICAL EXAM: Vital signs in last 24 hours: Filed Vitals:   04/23/15 2041 04/24/15 0447 04/24/15 1248 04/24/15 1401  BP: 156/73 136/65 124/69 153/72  Pulse: 92 96  79  Temp: 98 F (36.7 C) 99.5 F (37.5 C)  99.4 F (37.4 C)  TempSrc: Oral Oral  Oral  Resp: 20 20  18  Height:      Weight:      SpO2: 97% 96%  98%    Weight change:  Filed Weights   04/20/15 2034 04/21/15 1900  Weight: 90.9 kg (200 lb 6.4 oz) 90.719 kg (200 lb)   Body mass index is 30.42 kg/(m^2).   Gen Exam: Awake  and alert with clear speech.   Neck: Supple, No JVD.   Chest: B/L Clear.   no rales or rhonchi. CVS: S1 S2 Regular, no murmurs.  Abdomen: soft, BS +, Mildly tender in the upper abdomen without any rebound rigidity or guarding.Non distended.  Extremities: no edema, lower extremities warm to touch. Neurologic: Non Focal.   Skin: No Rash.   Wounds: N/A.   Intake/Output from previous day:  Intake/Output Summary (Last 24 hours) at 04/24/15 1411 Last data filed at 04/24/15 1230  Gross per 24 hour  Intake    750 ml  Output   1200 ml  Net   -450 ml     LAB RESULTS: CBC  Recent Labs Lab 04/20/15 0955 04/21/15 1018 04/22/15 0254 04/23/15 0400 04/24/15 0315  WBC 18.1* 22.7* 21.4* 15.2* 11.0*  HGB 13.8 12.3* 11.8* 11.4* 11.4*  HCT 39.6 35.2* 35.4* 33.9* 33.9*  PLT 212 191 187 178 197  MCV 90.8 91.0 91.0 90.4 89.4  MCH 31.7 31.8 30.3 30.4 30.1  MCHC 34.8 34.9 33.3 33.6 33.6  RDW 13.8 14.3 14.2 13.9 13.6  LYMPHSABS  --  2.7 2.1  --   --   MONOABS  --  2.3* 2.0*  --   --   EOSABS  --  0.0 0.0  --   --   BASOSABS  --  0.0 0.0  --   --     Chemistries   Recent Labs Lab 04/20/15 0523 04/20/15 0538 04/21/15 1018 04/22/15 0254 04/23/15 0400 04/24/15 0315  NA 138 139 141 138 136 132*  K 3.7 4.1 3.5 2.8* 4.1 3.7  CL 102 104 106 102 104 99*  CO2 24  --  26 25 25 22   GLUCOSE 181* 180* 81 50* 80 159*  BUN 15 20 18 16 12 13   CREATININE 0.81 0.70 0.98 0.78 0.89 0.76  CALCIUM 9.1  --  8.3* 8.5* 8.4* 8.1*    CBG:  Recent Labs Lab 04/23/15 1618 04/23/15 1645 04/23/15 2037 04/24/15 0614 04/24/15 1235  GLUCAP 81 128* 110* 131* 104*    GFR Estimated Creatinine Clearance: 95.3 mL/min (by C-G formula based on Cr of 0.76).  Coagulation profile  Recent Labs Lab 04/22/15 0940  INR 1.30    Cardiac Enzymes  Recent Labs Lab 04/20/15 0955 04/20/15 1522  TROPONINI <0.03 0.04*    Invalid input(s): POCBNP No results for input(s): DDIMER in the last 72 hours. No  results for input(s): HGBA1C in the last 72 hours. No results for input(s): CHOL, HDL, LDLCALC, TRIG, CHOLHDL, LDLDIRECT in the last 72 hours. No results for input(s): TSH, T4TOTAL, T3FREE, THYROIDAB in the last 72 hours.  Invalid input(s): FREET3 No results for input(s): VITAMINB12, FOLATE, FERRITIN, TIBC, IRON, RETICCTPCT in the last 72 hours.  Recent Labs  04/22/15 0940 04/23/15 0400  LIPASE 19 24    Urine Studies No results for input(s): UHGB, CRYS in the last 72 hours.  Invalid input(s): UACOL, UAPR, USPG, UPH, UTP, UGL, UKET, UBIL, UNIT, UROB, ULEU, UEPI, UWBC, URBC, UBAC, CAST, UCOM, BILUA  MICROBIOLOGY: Recent Results (from the past 240 hour(s))  Culture, blood (routine x 2)  Status: None (Preliminary result)   Collection Time: 04/21/15  5:20 PM  Result Value Ref Range Status   Specimen Description BLOOD LEFT ARM  Final   Special Requests IN PEDIATRIC BOTTLE 3CC  Final   Culture NO GROWTH 3 DAYS  Final   Report Status PENDING  Incomplete  Culture, blood (routine x 2)     Status: None (Preliminary result)   Collection Time: 04/21/15  5:30 PM  Result Value Ref Range Status   Specimen Description BLOOD LEFT HAND  Final   Special Requests IN PEDIATRIC BOTTLE 2CC  Final   Culture NO GROWTH 3 DAYS  Final   Report Status PENDING  Incomplete  Urine culture     Status: None   Collection Time: 04/22/15  2:08 PM  Result Value Ref Range Status   Specimen Description URINE, CLEAN CATCH  Final   Special Requests Normal  Final   Culture   Final    >=100,000 COLONIES/mL STAPHYLOCOCCUS SPECIES (COAGULASE NEGATIVE)   Report Status 04/24/2015 FINAL  Final   Organism ID, Bacteria STAPHYLOCOCCUS SPECIES (COAGULASE NEGATIVE)  Final      Susceptibility   Staphylococcus species (coagulase negative) - MIC*    CIPROFLOXACIN <=0.5 SENSITIVE Sensitive     GENTAMICIN <=0.5 SENSITIVE Sensitive     NITROFURANTOIN <=16 SENSITIVE Sensitive     OXACILLIN 2 SENSITIVE Sensitive      TETRACYCLINE <=1 SENSITIVE Sensitive     VANCOMYCIN <=0.5 SENSITIVE Sensitive     TRIMETH/SULFA <=10 SENSITIVE Sensitive     CLINDAMYCIN <=0.25 SENSITIVE Sensitive     RIFAMPIN <=0.5 SENSITIVE Sensitive     Inducible Clindamycin NEGATIVE Sensitive     * >=100,000 COLONIES/mL STAPHYLOCOCCUS SPECIES (COAGULASE NEGATIVE)  Surgical PCR screen     Status: None   Collection Time: 04/23/15  3:51 AM  Result Value Ref Range Status   MRSA, PCR NEGATIVE NEGATIVE Final   Staphylococcus aureus NEGATIVE NEGATIVE Final    Comment:        The Xpert SA Assay (FDA approved for NASAL specimens in patients over 55 years of age), is one component of a comprehensive surveillance program.  Test performance has been validated by [facility] for patients greater than or equal to 58 year old. It is not intended to diagnose infection nor to guide or monitor treatment.     RADIOLOGY STUDIES/RESULTS: Dg Cholangiogram Operative  04/23/2015  CLINICAL DATA:  Acute calculus cholecystitis EXAM: INTRAOPERATIVE CHOLANGIOGRAM TECHNIQUE: Cholangiographic images from the C-arm fluoroscopic device were submitted for interpretation post-operatively. Please see the procedural report for the amount of contrast and the fluoroscopy time utilized. COMPARISON:  04/21/2015 FINDINGS: Intraoperative cholangiogram performed during the laparoscopic cholecystectomy. The cystic duct, biliary confluence, common hepatic duct, and common bile duct are patent. Contrast drains into the duodenum. No dilatation or obstruction. No filling defect. Leakage of contrast noted at the injection site. IMPRESSION: Patent biliary system. Electronically Signed   By: Jerilynn Mages.  Shick M.D.   On: 04/23/2015 14:35   Nm Hepatobiliary Including Gb  04/24/2015  CLINICAL DATA:  Right upper quadrant abdominal pain status post cholecystectomy. EXAM: NUCLEAR MEDICINE HEPATOBILIARY IMAGING TECHNIQUE: Sequential images of the abdomen were obtained out to 60 minutes following  intravenous administration of radiopharmaceutical. RADIOPHARMACEUTICALS:  5.2 mCi Tc-5m  Choletec IV COMPARISON:  Ultrasound of April 21, 2015. FINDINGS: Normal uptake within hepatic parenchyma is noted. Normal excretion in the small bowel is noted. No uptake is seen to suggest bile leak. IMPRESSION: No evidence of bile leak seen  on this study. Electronically Signed   By: Marijo Conception, M.D.   On: 04/24/2015 11:47   Nm Myocar Multi W/spect W/wall Motion / Ef  04/21/2015   There was no ST segment deviation noted during stress.  The study is normal.  This is a low risk study.  The left ventricular ejection fraction is normal (55-65%).  Normal resting and stress perfusion no ischemia or infarction EF 61%   Dg Chest Portable 1 View  04/20/2015  CLINICAL DATA:  Acute onset of generalized chest pressure. Initial encounter. EXAM: PORTABLE CHEST 1 VIEW COMPARISON:  Chest radiograph performed 06/08/2007 FINDINGS: The lungs are well-aerated and clear. There is no evidence of focal opacification, pleural effusion or pneumothorax. The cardiomediastinal silhouette is within normal limits. No acute osseous abnormalities are seen. IMPRESSION: No acute cardiopulmonary process seen. Electronically Signed   By: Garald Balding M.D.   On: 04/20/2015 05:38   Ct Angio Chest Aorta W/cm &/or Wo/cm  04/20/2015  CLINICAL DATA:  Chest pain radiating to back EXAM: CT ANGIOGRAPHY CHEST WITH CONTRAST TECHNIQUE: Initially, axial CT images were obtained through the chest without intravenous contrast material administration. Multidetector CT imaging of the chest was performed using the standard protocol during bolus administration of intravenous contrast. Multiplanar CT image reconstructions and MIPs were obtained to evaluate the vascular anatomy. CONTRAST:  143mL OMNIPAQUE IOHEXOL 350 MG/ML SOLN COMPARISON:  Chest radiograph April 20, 2015 ; CT abdomen and pelvis including lung bases June 04, 2010 FINDINGS: Mediastinum/Lymph  Nodes: There is no appreciable thoracic aortic aneurysm or thoracic aortic dissection. The ascending thoracic aortic diameter is 3.8 x 3.7 cm. The visualized great vessels appear normal. Note that the right and left common carotid arteries arise as a common trunk, an anatomic variant. There is no demonstrable pulmonary embolus. The pericardium is not thickened. There are scattered foci of coronary artery calcification. There is left ventricular hypertrophy. Visualized thyroid appears unremarkable. There is no thoracic aortic aneurysm. There is a focal hiatal hernia. Lungs/Pleura: There is no parenchymal lung edema or consolidation. Slight bibasilar atelectasis is present, probably dependent in etiology. Upper abdomen: In the visualized upper abdomen, there is cholelithiasis. There is a degree of hepatic steatosis. The visualized upper abdominal structures otherwise appear unremarkable. Musculoskeletal: There are no blastic or lytic bone lesions. No intramuscular lesions are identified. Review of the MIP images confirms the above findings. IMPRESSION: No demonstrable thoracic aortic aneurysm or dissection. No pulmonary embolus evident. There are several foci of coronary artery calcification. There is left ventricular hypertrophy. The pericardium is not appreciably thickened. No parenchymal lung edema or consolidation. Rather minimal bibasilar atelectasis, like dependent in etiology. No appreciable thoracic adenopathy. Cholelithiasis. Focal hiatal hernia present. Electronically Signed   By: Lowella Grip III M.D.   On: 04/20/2015 07:37   US Abdomen Limited Ruq  04/21/2015  CLINICAL DATA:  Acute right upper quadrant abdominal pain EXAM: US ABDOMEN LIMITED - RIGHT UPPER QUADRANT COMPARISON:  06/04/2010, 04/20/2015 FINDINGS: Gallbladder: Echogenic shadowing gallstones within the gallbladder close to the neck area some of which are mobile. Wall thickness measures 4.3 mm, mildly thickened. Despite this, there is no  sonographic Murphy sign elicited over the gallbladder during the exam. Common bile duct: Diameter: 3.7 mm, limited visualization. Liver: Diffuse increased echogenicity compatible with hepatic steatosis or fatty infiltration. Patent hepatic and portal veins with normal directional flow. No intrahepatic biliary dilatation or definite focal hepatic abnormality. No upper abdominal free fluid. Included views of the right kidney demonstrate no hydronephrosis.  IMPRESSION: Cholelithiasis with mild gallbladder wall thickening, nonspecific. No sonographic Murphy sign elicited during the study. No biliary dilatation Hepatic steatosis Electronically Signed   By: Jerilynn Mages.  Shick M.D.   On: 04/21/2015 17:14    Oren Binet, MD  [facility] Pager:[phone]  If 7PM-7AM, please contact night-coverage [email or web address] Password [email or web address] 04/24/2015, 2:11 PM   LOS: 2 days

## 2015-04-24 NOTE — Discharge Instructions (Signed)

## 2015-04-24 NOTE — Care Management Important Message (Signed)
Important Message  Patient Details  Name: Jeff Rodriguez MRN: [id number] Date of Birth: 09/07/1945   Medicare Important Message Given:  Yes    Nathen May 04/24/2015, 11:55 AM

## 2015-04-24 NOTE — Progress Notes (Signed)
1 Day Post-Op  Subjective: Pt feeling OK this AM, some soreness  Objective: Vital signs in last 24 hours: Temp:  [98 F (36.7 C)-99.5 F (37.5 C)] 99.5 F (37.5 C) (03/03 0447) Pulse Rate:  [71-98] 96 (03/03 0447) Resp:  [13-20] 20 (03/03 0447) BP: (98-156)/(56-77) 136/65 mmHg (03/03 0447) SpO2:  [93 %-99 %] 96 % (03/03 0447) Last BM Date: 04/22/15  Intake/Output from previous day: 03/02 0701 - 03/03 0700 In: 1750 [P.O.:50; I.V.:1700] Out: 630 [Urine:600; Blood:30] Intake/Output this shift:    General appearance: alert and cooperative GI: soft, approp ttp, ND, incisions c/d/i  Lab Results:   Recent Labs  04/23/15 0400 04/24/15 0315  WBC 15.2* 11.0*  HGB 11.4* 11.4*  HCT 33.9* 33.9*  PLT 178 197   BMET  Recent Labs  04/23/15 0400 04/24/15 0315  NA 136 132*  K 4.1 3.7  CL 104 99*  CO2 25 22  GLUCOSE 80 159*  BUN 12 13  CREATININE 0.89 0.76  CALCIUM 8.4* 8.1*   PT/INR  Recent Labs  04/22/15 0940  LABPROT 16.3*  INR 1.30   ABG No results for input(s): PHART, HCO3 in the last 72 hours.  Invalid input(s): PCO2, PO2  Studies/Results: Dg Cholangiogram Operative  04/23/2015  CLINICAL DATA:  Acute calculus cholecystitis EXAM: INTRAOPERATIVE CHOLANGIOGRAM TECHNIQUE: Cholangiographic images from the C-arm fluoroscopic device were submitted for interpretation post-operatively. Please see the procedural report for the amount of contrast and the fluoroscopy time utilized. COMPARISON:  04/21/2015 FINDINGS: Intraoperative cholangiogram performed during the laparoscopic cholecystectomy. The cystic duct, biliary confluence, common hepatic duct, and common bile duct are patent. Contrast drains into the duodenum. No dilatation or obstruction. No filling defect. Leakage of contrast noted at the injection site. IMPRESSION: Patent biliary system. Electronically Signed   By: Jerilynn Mages.  Shick M.D.   On: 04/23/2015 14:35    Anti-infectives: Anti-infectives    Start     Dose/Rate  Route Frequency Ordered Stop   04/22/15 1200  piperacillin-tazobactam (ZOSYN) IVPB 3.375 g     3.375 g 12.5 mL/hr over 240 Minutes Intravenous 3 times per day 04/22/15 1102     04/22/15 1100  Ampicillin-Sulbactam (UNASYN) 3 g in sodium chloride 0.9 % 100 mL IVPB  Status:  Discontinued     3 g 100 mL/hr over 60 Minutes Intravenous Every 6 hours 04/22/15 1008 04/22/15 1100      Assessment/Plan: s/p Procedure(s): LAPAROSCOPIC CHOLECYSTECTOMY WITH INTRAOPERATIVE CHOLANGIOGRAM (N/A) TB up this AM.  Will proceed with HIDA to eval for bile leak.  No stones in CBD on IOC.  Pt did have duct of Luschka that was sutured intraop that could be leaking.  IF leak detected will consult GI for stent placement.  IF no leak may require another day to eval LFTs.  I discussed with pt who agrees.    LOS: 2 days    Rosario Jacks., [facility] 04/24/2015

## 2015-04-25 DIAGNOSIS — K810 Acute cholecystitis: Secondary | ICD-10-CM

## 2015-04-25 LAB — HEPATIC FUNCTION PANEL
ALT: 149 U/L — ABNORMAL HIGH (ref 17–63)
AST: 95 U/L — AB (ref 15–41)
Albumin: 2.2 g/dL — ABNORMAL LOW (ref 3.5–5.0)
Alkaline Phosphatase: 126 U/L (ref 38–126)
BILIRUBIN DIRECT: 1.1 mg/dL — AB (ref 0.1–0.5)
BILIRUBIN INDIRECT: 1 mg/dL — AB (ref 0.3–0.9)
Total Bilirubin: 2.1 mg/dL — ABNORMAL HIGH (ref 0.3–1.2)
Total Protein: 6.2 g/dL — ABNORMAL LOW (ref 6.5–8.1)

## 2015-04-25 LAB — GLUCOSE, CAPILLARY: GLUCOSE-CAPILLARY: 132 mg/dL — AB (ref 65–99)

## 2015-04-25 MED ORDER — OXYCODONE HCL 10 MG PO TABS: 10.0000 mg | ORAL_TABLET | ORAL | Status: DC | PRN

## 2015-04-25 MED ORDER — INSULIN GLARGINE 100 UNIT/ML ~~LOC~~ SOLN: 20.0000 [IU] | Freq: Every day | SUBCUTANEOUS | Status: DC

## 2015-04-25 NOTE — Discharge Summary (Signed)
Physician Discharge Summary  Jeff Rodriguez [id number] DOB: [birth year] DOA: 04/20/2015  PCP: No primary care provider on file.  Admit date: 04/20/2015 Discharge date: 04/25/2015  Time spent: greater than 30 minutes  Recommendations for Outpatient Follow-up:  1.    Discharge Diagnoses:  Principal Problem:   Cholecystitis, acute s/p lap chole 04/23/15 Active Problems:   Chest pain   Chest wall pain   DM2 (diabetes mellitus, type 2) (HCC)   Hyperlipidemia   BPH (benign prostatic hyperplasia)   Discharge Condition: stable  Diet recommendation: carbohydrate modified. Heart healthy  Filed Weights   04/20/15 2034 04/21/15 1900  Weight: 90.9 kg (200 lb 6.4 oz) 90.719 kg (200 lb)    History of present illness/Hospital Course:  70 year old Caucasian male with history of diabetes, hypertension, dyslipidemia initially presented with lower chest pain/epigastric pain. Initially thought to have cardiac etiology, however upon further evaluation patient was found to have acute calculus cholecystitis. Patient underwent a nuclear stress test, that was negative, subsequently underwent a laparoscopic cholecystectomy on 04/23/15.   Procedures:  Lap chole  Consultations:  Cardiology  General surgery  Discharge Exam: Filed Vitals:   04/25/15 0511 04/25/15 0849  BP: 144/62 147/68  Pulse: 74 84  Temp: 97.2 F (36.2 C) 99.5 F (37.5 C)  Resp: 16 18    General: a and o Cardiovascular: RRR Respiratory: CTA Abd: Soft. Bowel sounds present incisions ok  Discharge Instructions   Discharge Instructions    Diet - low sodium heart healthy    Complete by:  As directed      Diet Carb Modified    Complete by:  As directed      Discharge instructions    Complete by:  As directed   Hold metformin and zocor for one week. Decrease lantus to 20 units until appetite back to normal     Lifting restrictions    Complete by:  As directed   No heavy lifting     May shower / Bathe     Complete by:  As directed           Current Discharge Medication List    START taking these medications   Details  Oxycodone HCl 10 MG TABS Take 1 tablet (10 mg total) by mouth every 4 (four) hours as needed (for pain). Qty: 30 tablet, Refills: 0      CONTINUE these medications which have CHANGED   Details  insulin glargine (LANTUS) 100 UNIT/ML injection Inject 0.2 mLs (20 Units total) into the skin at bedtime. Qty: 10 mL, Refills: 11      CONTINUE these medications which have NOT CHANGED   Details  aspirin 81 MG chewable tablet Chew 81 mg by mouth daily.    cyclobenzaprine (FLEXERIL) 5 MG tablet Take 5 mg by mouth 3 (three) times daily as needed for muscle spasms.    finasteride (PROSCAR) 5 MG tablet Take 5 mg by mouth daily.    lisinopril (PRINIVIL,ZESTRIL) 20 MG tablet Take 10 mg by mouth daily.    tamsulosin (FLOMAX) 0.4 MG CAPS capsule Take 0.8 mg by mouth at bedtime.       STOP taking these medications     metFORMIN (GLUCOPHAGE) 1000 MG tablet      simvastatin (ZOCOR) 40 MG tablet        Allergies  Allergen Reactions  . Shrimp [Shellfish Allergy] Anaphylaxis   Follow-up Information    Follow up with [facility] On 05/13/2015.   Specialty:  General Surgery  Why:  arrive by 11:15AM for a 11:45AM post op check   Contact information:   [place] [address] [phone]        The results of significant diagnostics from this hospitalization (including imaging, microbiology, ancillary and laboratory) are listed below for reference.    Significant Diagnostic Studies: Dg Cholangiogram Operative  04/23/2015  CLINICAL DATA:  Acute calculus cholecystitis EXAM: INTRAOPERATIVE CHOLANGIOGRAM TECHNIQUE: Cholangiographic images from the C-arm fluoroscopic device were submitted for interpretation post-operatively. Please see the procedural report for the amount of contrast and the fluoroscopy time utilized. COMPARISON:  04/21/2015  FINDINGS: Intraoperative cholangiogram performed during the laparoscopic cholecystectomy. The cystic duct, biliary confluence, common hepatic duct, and common bile duct are patent. Contrast drains into the duodenum. No dilatation or obstruction. No filling defect. Leakage of contrast noted at the injection site. IMPRESSION: Patent biliary system. Electronically Signed   By: Jerilynn Mages.  Shick M.D.   On: 04/23/2015 14:35   Nm Hepatobiliary Including Gb  04/24/2015  CLINICAL DATA:  Right upper quadrant abdominal pain status post cholecystectomy. EXAM: NUCLEAR MEDICINE HEPATOBILIARY IMAGING TECHNIQUE: Sequential images of the abdomen were obtained out to 60 minutes following intravenous administration of radiopharmaceutical. RADIOPHARMACEUTICALS:  5.2 mCi Tc-65m  Choletec IV COMPARISON:  Ultrasound of April 21, 2015. FINDINGS: Normal uptake within hepatic parenchyma is noted. Normal excretion in the small bowel is noted. No uptake is seen to suggest bile leak. IMPRESSION: No evidence of bile leak seen on this study. Electronically Signed   By: Marijo Conception, M.D.   On: 04/24/2015 11:47   Nm Myocar Multi W/spect W/wall Motion / Ef  04/21/2015   There was no ST segment deviation noted during stress.  The study is normal.  This is a low risk study.  The left ventricular ejection fraction is normal (55-65%).  Normal resting and stress perfusion no ischemia or infarction EF 61%   Dg Chest Portable 1 View  04/20/2015  CLINICAL DATA:  Acute onset of generalized chest pressure. Initial encounter. EXAM: PORTABLE CHEST 1 VIEW COMPARISON:  Chest radiograph performed 06/08/2007 FINDINGS: The lungs are well-aerated and clear. There is no evidence of focal opacification, pleural effusion or pneumothorax. The cardiomediastinal silhouette is within normal limits. No acute osseous abnormalities are seen. IMPRESSION: No acute cardiopulmonary process seen. Electronically Signed   By: Garald Balding M.D.   On: 04/20/2015 05:38    Ct Angio Chest Aorta W/cm &/or Wo/cm  04/20/2015  CLINICAL DATA:  Chest pain radiating to back EXAM: CT ANGIOGRAPHY CHEST WITH CONTRAST TECHNIQUE: Initially, axial CT images were obtained through the chest without intravenous contrast material administration. Multidetector CT imaging of the chest was performed using the standard protocol during bolus administration of intravenous contrast. Multiplanar CT image reconstructions and MIPs were obtained to evaluate the vascular anatomy. CONTRAST:  138mL OMNIPAQUE IOHEXOL 350 MG/ML SOLN COMPARISON:  Chest radiograph April 20, 2015 ; CT abdomen and pelvis including lung bases June 04, 2010 FINDINGS: Mediastinum/Lymph Nodes: There is no appreciable thoracic aortic aneurysm or thoracic aortic dissection. The ascending thoracic aortic diameter is 3.8 x 3.7 cm. The visualized great vessels appear normal. Note that the right and left common carotid arteries arise as a common trunk, an anatomic variant. There is no demonstrable pulmonary embolus. The pericardium is not thickened. There are scattered foci of coronary artery calcification. There is left ventricular hypertrophy. Visualized thyroid appears unremarkable. There is no thoracic aortic aneurysm. There is a focal hiatal hernia. Lungs/Pleura: There is  no parenchymal lung edema or consolidation. Slight bibasilar atelectasis is present, probably dependent in etiology. Upper abdomen: In the visualized upper abdomen, there is cholelithiasis. There is a degree of hepatic steatosis. The visualized upper abdominal structures otherwise appear unremarkable. Musculoskeletal: There are no blastic or lytic bone lesions. No intramuscular lesions are identified. Review of the MIP images confirms the above findings. IMPRESSION: No demonstrable thoracic aortic aneurysm or dissection. No pulmonary embolus evident. There are several foci of coronary artery calcification. There is left ventricular hypertrophy. The pericardium is not  appreciably thickened. No parenchymal lung edema or consolidation. Rather minimal bibasilar atelectasis, like dependent in etiology. No appreciable thoracic adenopathy. Cholelithiasis. Focal hiatal hernia present. Electronically Signed   By: Lowella Grip III M.D.   On: 04/20/2015 07:37   US Abdomen Limited Ruq  04/21/2015  CLINICAL DATA:  Acute right upper quadrant abdominal pain EXAM: US ABDOMEN LIMITED - RIGHT UPPER QUADRANT COMPARISON:  06/04/2010, 04/20/2015 FINDINGS: Gallbladder: Echogenic shadowing gallstones within the gallbladder close to the neck area some of which are mobile. Wall thickness measures 4.3 mm, mildly thickened. Despite this, there is no sonographic Murphy sign elicited over the gallbladder during the exam. Common bile duct: Diameter: 3.7 mm, limited visualization. Liver: Diffuse increased echogenicity compatible with hepatic steatosis or fatty infiltration. Patent hepatic and portal veins with normal directional flow. No intrahepatic biliary dilatation or definite focal hepatic abnormality. No upper abdominal free fluid. Included views of the right kidney demonstrate no hydronephrosis. IMPRESSION: Cholelithiasis with mild gallbladder wall thickening, nonspecific. No sonographic Murphy sign elicited during the study. No biliary dilatation Hepatic steatosis Electronically Signed   By: Jerilynn Mages.  Shick M.D.   On: 04/21/2015 17:14    Microbiology: Recent Results (from the past 240 hour(s))  Culture, blood (routine x 2)     Status: None (Preliminary result)   Collection Time: 04/21/15  5:20 PM  Result Value Ref Range Status   Specimen Description BLOOD LEFT ARM  Final   Special Requests IN PEDIATRIC BOTTLE 3CC  Final   Culture NO GROWTH 3 DAYS  Final   Report Status PENDING  Incomplete  Culture, blood (routine x 2)     Status: None (Preliminary result)   Collection Time: 04/21/15  5:30 PM  Result Value Ref Range Status   Specimen Description BLOOD LEFT HAND  Final   Special  Requests IN PEDIATRIC BOTTLE 2CC  Final   Culture NO GROWTH 3 DAYS  Final   Report Status PENDING  Incomplete  Urine culture     Status: None   Collection Time: 04/22/15  2:08 PM  Result Value Ref Range Status   Specimen Description URINE, CLEAN CATCH  Final   Special Requests Normal  Final   Culture   Final    >=100,000 COLONIES/mL STAPHYLOCOCCUS SPECIES (COAGULASE NEGATIVE)   Report Status 04/24/2015 FINAL  Final   Organism ID, Bacteria STAPHYLOCOCCUS SPECIES (COAGULASE NEGATIVE)  Final      Susceptibility   Staphylococcus species (coagulase negative) - MIC*    CIPROFLOXACIN <=0.5 SENSITIVE Sensitive     GENTAMICIN <=0.5 SENSITIVE Sensitive     NITROFURANTOIN <=16 SENSITIVE Sensitive     OXACILLIN 2 SENSITIVE Sensitive     TETRACYCLINE <=1 SENSITIVE Sensitive     VANCOMYCIN <=0.5 SENSITIVE Sensitive     TRIMETH/SULFA <=10 SENSITIVE Sensitive     CLINDAMYCIN <=0.25 SENSITIVE Sensitive     RIFAMPIN <=0.5 SENSITIVE Sensitive     Inducible Clindamycin NEGATIVE Sensitive     * >=100,000 COLONIES/mL  STAPHYLOCOCCUS SPECIES (COAGULASE NEGATIVE)  Surgical PCR screen     Status: None   Collection Time: 04/23/15  3:51 AM  Result Value Ref Range Status   MRSA, PCR NEGATIVE NEGATIVE Final   Staphylococcus aureus NEGATIVE NEGATIVE Final    Comment:        The Xpert SA Assay (FDA approved for NASAL specimens in patients over 61 years of age), is one component of a comprehensive surveillance program.  Test performance has been validated by [facility] for patients greater than or equal to 12 year old. It is not intended to diagnose infection nor to guide or monitor treatment.      Labs: Basic Metabolic Panel:  Recent Labs Lab 04/20/15 0523 04/20/15 0538 04/21/15 1018 04/22/15 0254 04/23/15 0400 04/24/15 0315  NA 138 139 141 138 136 132*  K 3.7 4.1 3.5 2.8* 4.1 3.7  CL 102 104 106 102 104 99*  CO2 24  --  26 25 25 22   GLUCOSE 181* 180* 81 50* 80 159*  BUN 15 20 18 16 12  13   CREATININE 0.81 0.70 0.98 0.78 0.89 0.76  CALCIUM 9.1  --  8.3* 8.5* 8.4* 8.1*   Liver Function Tests:  Recent Labs Lab 04/21/15 1018 04/22/15 0254 04/23/15 0400 04/24/15 0315 04/25/15 0310  AST 18 21 23  305* 95*  ALT 22 21 23  211* 149*  ALKPHOS 38 50 49 156* 126  BILITOT 1.7* 1.3* 1.5* 3.6* 2.1*  PROT 6.7 6.8 6.8 6.6 6.2*  ALBUMIN 3.0* 2.8* 2.7* 2.5* 2.2*    Recent Labs Lab 04/20/15 0523 04/22/15 0940 04/23/15 0400  LIPASE 31 19 24    No results for input(s): AMMONIA in the last 168 hours. CBC:  Recent Labs Lab 04/20/15 0955 04/21/15 1018 04/22/15 0254 04/23/15 0400 04/24/15 0315  WBC 18.1* 22.7* 21.4* 15.2* 11.0*  NEUTROABS  --  17.6* 17.2*  --   --   HGB 13.8 12.3* 11.8* 11.4* 11.4*  HCT 39.6 35.2* 35.4* 33.9* 33.9*  MCV 90.8 91.0 91.0 90.4 89.4  PLT 212 191 187 178 197   Cardiac Enzymes:  Recent Labs Lab 04/20/15 0955 04/20/15 1522  TROPONINI <0.03 0.04*   BNP: BNP (last 3 results) No results for input(s): BNP in the last 8760 hours.  ProBNP (last 3 results) No results for input(s): PROBNP in the last 8760 hours.  CBG:  Recent Labs Lab 04/24/15 0614 04/24/15 1235 04/24/15 1559 04/24/15 2120 04/25/15 0627  GLUCAP 131* 104* 156* 206* 132*       Signed:  Delfina Redwood MD [facility] 04/25/2015, 9:24 AM

## 2015-04-25 NOTE — Progress Notes (Signed)
Pt D/C home per MD order, D/C instructions reviewed with pt and family, all questions answered. Pt aware of follow up appt., Pt  Instructed to pick up his prescriptions for Lantus and oxycodone at any pharmacy store. IV removed and site looks clean and intact, Pt verbalized understanding of discharged instructions.

## 2015-04-25 NOTE — Progress Notes (Signed)
  Progress Note: General Surgery Service   Subjective: HIDA scan negative yesterday. Tolerated diet, pain controlled. +BM  Objective: Vital signs in last 24 hours: Temp:  [97.2 F (36.2 C)-99.4 F (37.4 C)] 97.2 F (36.2 C) (03/04 0511) Pulse Rate:  [74-81] 74 (03/04 0511) Resp:  [16-20] 16 (03/04 0511) BP: (124-153)/(62-72) 144/62 mmHg (03/04 0511) SpO2:  [96 %-98 %] 97 % (03/04 0511) Last BM Date: 04/24/15  Intake/Output from previous day: 03/03 0701 - 03/04 0700 In: 240 [P.O.:240] Out: 1150 [Urine:1150] Intake/Output this shift:    Lungs: CTAB  Cardiovascular: RRR  Abd: soft, NT, ND, wounds c/d/i  Extremities: no edema  Neuro: AOx4  Lab Results: CBC   Recent Labs  04/23/15 0400 04/24/15 0315  WBC 15.2* 11.0*  HGB 11.4* 11.4*  HCT 33.9* 33.9*  PLT 178 197   BMET  Recent Labs  04/23/15 0400 04/24/15 0315  NA 136 132*  K 4.1 3.7  CL 104 99*  CO2 25 22  GLUCOSE 80 159*  BUN 12 13  CREATININE 0.89 0.76  CALCIUM 8.4* 8.1*   PT/INR  Recent Labs  04/22/15 0940  LABPROT 16.3*  INR 1.30   ABG No results for input(s): PHART, HCO3 in the last 72 hours.  Invalid input(s): PCO2, PO2  Studies/Results:  Anti-infectives: Anti-infectives    Start     Dose/Rate Route Frequency Ordered Stop   04/22/15 1200  piperacillin-tazobactam (ZOSYN) IVPB 3.375 g  Status:  Discontinued     3.375 g 12.5 mL/hr over 240 Minutes Intravenous 3 times per day 04/22/15 1102 04/24/15 1515   04/22/15 1100  Ampicillin-Sulbactam (UNASYN) 3 g in sodium chloride 0.9 % 100 mL IVPB  Status:  Discontinued     3 g 100 mL/hr over 60 Minutes Intravenous Every 6 hours 04/22/15 1008 04/22/15 1100      Medications: Scheduled Meds: . aspirin  81 mg Oral Daily  . enoxaparin (LOVENOX) injection  40 mg Subcutaneous Q24H  . finasteride  5 mg Oral Daily  . insulin aspart  0-9 Units Subcutaneous TID WC  . insulin glargine  15 Units Subcutaneous QHS  . lisinopril  10 mg Oral Daily   . tamsulosin  0.4 mg Oral BID   Continuous Infusions: . lactated ringers Stopped (04/23/15 1716)   PRN Meds:.acetaminophen, gi cocktail, morphine injection, ondansetron (ZOFRAN) IV, oxyCODONE-acetaminophen  Assessment/Plan: Patient Active Problem List   Diagnosis Date Noted  . Cholecystitis, acute s/p lap chole 04/23/15 04/24/2015  . Chest pain 04/20/2015  . Chest wall pain 04/20/2015  . DM2 (diabetes mellitus, type 2) ([place]) 04/20/2015  . Hyperlipidemia 04/20/2015  . BPH (benign prostatic hyperplasia) 04/20/2015  . Diabetes mellitus with complication ([place])   . Essential hypertension    s/p Procedure(s): LAPAROSCOPIC CHOLECYSTECTOMY WITH INTRAOPERATIVE CHOLANGIOGRAM 04/23/2015  POD 2. Bilirubin downtrending, pain controlled -ok to discharge from surgery standpoint -follow up with [facility] as scheduled   LOS: 3 days   Mickeal Skinner, MD Pg# [phone] [facility]

## 2015-04-26 LAB — CULTURE, BLOOD (ROUTINE X 2)
CULTURE: NO GROWTH
Culture: NO GROWTH

## 2015-04-27 LAB — GLUCOSE, CAPILLARY: GLUCOSE-CAPILLARY: 23 mg/dL — AB (ref 65–99)

## 2015-04-28 ENCOUNTER — Encounter (HOSPITAL_COMMUNITY): Payer: Self-pay | Admitting: General Surgery

## 2016-04-13 DIAGNOSIS — R7881 Bacteremia: Secondary | ICD-10-CM | POA: Diagnosis not present

## 2020-08-11 ENCOUNTER — Emergency Department (HOSPITAL_COMMUNITY): Payer: No Typology Code available for payment source

## 2020-08-11 ENCOUNTER — Other Ambulatory Visit: Payer: Self-pay

## 2020-08-11 ENCOUNTER — Encounter (HOSPITAL_COMMUNITY): Payer: Self-pay | Admitting: Emergency Medicine

## 2020-08-11 ENCOUNTER — Inpatient Hospital Stay (HOSPITAL_COMMUNITY)
Admission: EM | Admit: 2020-08-11 | Discharge: 2020-09-12 | DRG: 682 | Disposition: A | Discharge status: Skilled Nursing Facility | Payer: No Typology Code available for payment source | Attending: Internal Medicine | Admitting: Internal Medicine

## 2020-08-11 DIAGNOSIS — Z79899 Other long term (current) drug therapy: Secondary | ICD-10-CM

## 2020-08-11 DIAGNOSIS — Z66 Do not resuscitate: Secondary | ICD-10-CM | POA: Diagnosis not present

## 2020-08-11 DIAGNOSIS — N401 Enlarged prostate with lower urinary tract symptoms: Secondary | ICD-10-CM | POA: Diagnosis present

## 2020-08-11 DIAGNOSIS — E871 Hypo-osmolality and hyponatremia: Secondary | ICD-10-CM | POA: Diagnosis present

## 2020-08-11 DIAGNOSIS — U071 COVID-19: Secondary | ICD-10-CM | POA: Diagnosis present

## 2020-08-11 DIAGNOSIS — Z91013 Allergy to seafood: Secondary | ICD-10-CM

## 2020-08-11 DIAGNOSIS — E1122 Type 2 diabetes mellitus with diabetic chronic kidney disease: Secondary | ICD-10-CM | POA: Diagnosis present

## 2020-08-11 DIAGNOSIS — N189 Chronic kidney disease, unspecified: Secondary | ICD-10-CM | POA: Diagnosis not present

## 2020-08-11 DIAGNOSIS — I129 Hypertensive chronic kidney disease with stage 1 through stage 4 chronic kidney disease, or unspecified chronic kidney disease: Secondary | ICD-10-CM | POA: Diagnosis present

## 2020-08-11 DIAGNOSIS — E118 Type 2 diabetes mellitus with unspecified complications: Secondary | ICD-10-CM | POA: Diagnosis not present

## 2020-08-11 DIAGNOSIS — E1165 Type 2 diabetes mellitus with hyperglycemia: Secondary | ICD-10-CM | POA: Diagnosis present

## 2020-08-11 DIAGNOSIS — K5900 Constipation, unspecified: Secondary | ICD-10-CM | POA: Diagnosis present

## 2020-08-11 DIAGNOSIS — R140 Abdominal distension (gaseous): Secondary | ICD-10-CM

## 2020-08-11 DIAGNOSIS — L89152 Pressure ulcer of sacral region, stage 2: Secondary | ICD-10-CM | POA: Diagnosis present

## 2020-08-11 DIAGNOSIS — N1832 Chronic kidney disease, stage 3b: Secondary | ICD-10-CM | POA: Diagnosis present

## 2020-08-11 DIAGNOSIS — E872 Acidosis: Secondary | ICD-10-CM | POA: Diagnosis present

## 2020-08-11 DIAGNOSIS — Z96641 Presence of right artificial hip joint: Secondary | ICD-10-CM | POA: Diagnosis present

## 2020-08-11 DIAGNOSIS — E119 Type 2 diabetes mellitus without complications: Secondary | ICD-10-CM | POA: Diagnosis not present

## 2020-08-11 DIAGNOSIS — N390 Urinary tract infection, site not specified: Secondary | ICD-10-CM | POA: Diagnosis not present

## 2020-08-11 DIAGNOSIS — J45909 Unspecified asthma, uncomplicated: Secondary | ICD-10-CM | POA: Diagnosis present

## 2020-08-11 DIAGNOSIS — E876 Hypokalemia: Secondary | ICD-10-CM | POA: Diagnosis present

## 2020-08-11 DIAGNOSIS — C9000 Multiple myeloma not having achieved remission: Secondary | ICD-10-CM | POA: Diagnosis present

## 2020-08-11 DIAGNOSIS — Z452 Encounter for adjustment and management of vascular access device: Secondary | ICD-10-CM

## 2020-08-11 DIAGNOSIS — F32A Depression, unspecified: Secondary | ICD-10-CM | POA: Diagnosis present

## 2020-08-11 DIAGNOSIS — E8729 Other acidosis: Secondary | ICD-10-CM

## 2020-08-11 DIAGNOSIS — N170 Acute kidney failure with tubular necrosis: Secondary | ICD-10-CM | POA: Diagnosis present

## 2020-08-11 DIAGNOSIS — L8990 Pressure ulcer of unspecified site, unspecified stage: Secondary | ICD-10-CM | POA: Insufficient documentation

## 2020-08-11 DIAGNOSIS — K219 Gastro-esophageal reflux disease without esophagitis: Secondary | ICD-10-CM | POA: Diagnosis present

## 2020-08-11 DIAGNOSIS — T17908A Unspecified foreign body in respiratory tract, part unspecified causing other injury, initial encounter: Secondary | ICD-10-CM

## 2020-08-11 DIAGNOSIS — D751 Secondary polycythemia: Secondary | ICD-10-CM | POA: Diagnosis not present

## 2020-08-11 DIAGNOSIS — D696 Thrombocytopenia, unspecified: Secondary | ICD-10-CM | POA: Diagnosis not present

## 2020-08-11 DIAGNOSIS — G9341 Metabolic encephalopathy: Secondary | ICD-10-CM | POA: Diagnosis present

## 2020-08-11 DIAGNOSIS — Z794 Long term (current) use of insulin: Secondary | ICD-10-CM

## 2020-08-11 DIAGNOSIS — Z0189 Encounter for other specified special examinations: Secondary | ICD-10-CM

## 2020-08-11 DIAGNOSIS — R7989 Other specified abnormal findings of blood chemistry: Secondary | ICD-10-CM | POA: Diagnosis not present

## 2020-08-11 DIAGNOSIS — Z6820 Body mass index (BMI) 20.0-20.9, adult: Secondary | ICD-10-CM

## 2020-08-11 DIAGNOSIS — N179 Acute kidney failure, unspecified: Secondary | ICD-10-CM | POA: Diagnosis present

## 2020-08-11 DIAGNOSIS — E43 Unspecified severe protein-calorie malnutrition: Secondary | ICD-10-CM | POA: Diagnosis present

## 2020-08-11 DIAGNOSIS — Z515 Encounter for palliative care: Secondary | ICD-10-CM | POA: Diagnosis not present

## 2020-08-11 DIAGNOSIS — I10 Essential (primary) hypertension: Secondary | ICD-10-CM | POA: Diagnosis present

## 2020-08-11 DIAGNOSIS — D61818 Other pancytopenia: Secondary | ICD-10-CM | POA: Diagnosis not present

## 2020-08-11 DIAGNOSIS — R252 Cramp and spasm: Secondary | ICD-10-CM | POA: Diagnosis not present

## 2020-08-11 DIAGNOSIS — D61810 Antineoplastic chemotherapy induced pancytopenia: Secondary | ICD-10-CM | POA: Diagnosis present

## 2020-08-11 DIAGNOSIS — R339 Retention of urine, unspecified: Secondary | ICD-10-CM | POA: Diagnosis not present

## 2020-08-11 DIAGNOSIS — R32 Unspecified urinary incontinence: Secondary | ICD-10-CM | POA: Diagnosis not present

## 2020-08-11 DIAGNOSIS — E785 Hyperlipidemia, unspecified: Secondary | ICD-10-CM | POA: Diagnosis present

## 2020-08-11 DIAGNOSIS — E440 Moderate protein-calorie malnutrition: Secondary | ICD-10-CM | POA: Insufficient documentation

## 2020-08-11 DIAGNOSIS — R400 Somnolence: Secondary | ICD-10-CM | POA: Diagnosis not present

## 2020-08-11 DIAGNOSIS — Z96653 Presence of artificial knee joint, bilateral: Secondary | ICD-10-CM | POA: Diagnosis present

## 2020-08-11 DIAGNOSIS — R432 Parageusia: Secondary | ICD-10-CM | POA: Diagnosis not present

## 2020-08-11 DIAGNOSIS — Z7984 Long term (current) use of oral hypoglycemic drugs: Secondary | ICD-10-CM

## 2020-08-11 DIAGNOSIS — R0602 Shortness of breath: Secondary | ICD-10-CM

## 2020-08-11 DIAGNOSIS — N10 Acute tubulo-interstitial nephritis: Secondary | ICD-10-CM | POA: Diagnosis not present

## 2020-08-11 DIAGNOSIS — R1310 Dysphagia, unspecified: Secondary | ICD-10-CM | POA: Diagnosis present

## 2020-08-11 DIAGNOSIS — Z4659 Encounter for fitting and adjustment of other gastrointestinal appliance and device: Secondary | ICD-10-CM

## 2020-08-11 DIAGNOSIS — Z7189 Other specified counseling: Secondary | ICD-10-CM | POA: Diagnosis not present

## 2020-08-11 DIAGNOSIS — D631 Anemia in chronic kidney disease: Secondary | ICD-10-CM | POA: Diagnosis present

## 2020-08-11 DIAGNOSIS — T451X5A Adverse effect of antineoplastic and immunosuppressive drugs, initial encounter: Secondary | ICD-10-CM | POA: Diagnosis present

## 2020-08-11 DIAGNOSIS — Z801 Family history of malignant neoplasm of trachea, bronchus and lung: Secondary | ICD-10-CM

## 2020-08-11 DIAGNOSIS — R627 Adult failure to thrive: Secondary | ICD-10-CM | POA: Diagnosis not present

## 2020-08-11 DIAGNOSIS — R1110 Vomiting, unspecified: Secondary | ICD-10-CM

## 2020-08-11 DIAGNOSIS — E860 Dehydration: Secondary | ICD-10-CM | POA: Diagnosis present

## 2020-08-11 DIAGNOSIS — M810 Age-related osteoporosis without current pathological fracture: Secondary | ICD-10-CM | POA: Diagnosis present

## 2020-08-11 DIAGNOSIS — R110 Nausea: Secondary | ICD-10-CM

## 2020-08-11 LAB — CBC WITH DIFFERENTIAL/PLATELET
Abs Immature Granulocytes: 0.02 10*3/uL (ref 0.00–0.07)
Basophils Absolute: 0 10*3/uL (ref 0.0–0.1)
Basophils Relative: 1 %
Eosinophils Absolute: 0.1 10*3/uL (ref 0.0–0.5)
Eosinophils Relative: 1 %
HCT: 22.8 % — ABNORMAL LOW (ref 39.0–52.0)
Hemoglobin: 7.7 g/dL — ABNORMAL LOW (ref 13.0–17.0)
Immature Granulocytes: 1 %
Lymphocytes Relative: 18 %
Lymphs Abs: 0.7 10*3/uL (ref 0.7–4.0)
MCH: 32.8 pg (ref 26.0–34.0)
MCHC: 33.8 g/dL (ref 30.0–36.0)
MCV: 97 fL (ref 80.0–100.0)
Monocytes Absolute: 0.3 10*3/uL (ref 0.1–1.0)
Monocytes Relative: 8 %
Neutro Abs: 2.6 10*3/uL (ref 1.7–7.7)
Neutrophils Relative %: 71 %
Platelets: 86 10*3/uL — ABNORMAL LOW (ref 150–400)
RBC: 2.35 MIL/uL — ABNORMAL LOW (ref 4.22–5.81)
RDW: 15.9 % — ABNORMAL HIGH (ref 11.5–15.5)
WBC: 3.7 10*3/uL — ABNORMAL LOW (ref 4.0–10.5)
nRBC: 0 % (ref 0.0–0.2)

## 2020-08-11 LAB — FERRITIN: Ferritin: 1048 ng/mL — ABNORMAL HIGH (ref 24–336)

## 2020-08-11 LAB — COMPREHENSIVE METABOLIC PANEL
ALT: 31 U/L (ref 0–44)
AST: 40 U/L (ref 15–41)
Albumin: 2.7 g/dL — ABNORMAL LOW (ref 3.5–5.0)
Alkaline Phosphatase: 44 U/L (ref 38–126)
Anion gap: 16 — ABNORMAL HIGH (ref 5–15)
BUN: 67 mg/dL — ABNORMAL HIGH (ref 8–23)
CO2: 16 mmol/L — ABNORMAL LOW (ref 22–32)
Calcium: 6.8 mg/dL — ABNORMAL LOW (ref 8.9–10.3)
Chloride: 97 mmol/L — ABNORMAL LOW (ref 98–111)
Creatinine, Ser: 7.27 mg/dL — ABNORMAL HIGH (ref 0.61–1.24)
GFR, Estimated: 7 mL/min — ABNORMAL LOW (ref >60)
Glucose, Bld: 75 mg/dL (ref 70–99)
Potassium: 2.6 mmol/L — CL (ref 3.5–5.1)
Sodium: 129 mmol/L — ABNORMAL LOW (ref 135–145)
Total Bilirubin: 0.7 mg/dL (ref 0.3–1.2)
Total Protein: 6.3 g/dL — ABNORMAL LOW (ref 6.5–8.1)

## 2020-08-11 LAB — LACTIC ACID, PLASMA
Lactic Acid, Venous: 1.1 mmol/L (ref 0.5–1.9)
Lactic Acid, Venous: 1.5 mmol/L (ref 0.5–1.9)

## 2020-08-11 LAB — RESP PANEL BY RT-PCR (FLU A&B, COVID) ARPGX2
Influenza A by PCR: NEGATIVE
Influenza B by PCR: NEGATIVE
SARS Coronavirus 2 by RT PCR: POSITIVE — AB

## 2020-08-11 LAB — C-REACTIVE PROTEIN: CRP: 24.1 mg/dL — ABNORMAL HIGH (ref <1.0)

## 2020-08-11 LAB — D-DIMER, QUANTITATIVE: D-Dimer, Quant: 2.76 ug/mL-FEU — ABNORMAL HIGH (ref 0.00–0.50)

## 2020-08-11 LAB — LACTATE DEHYDROGENASE: LDH: 201 U/L — ABNORMAL HIGH (ref 98–192)

## 2020-08-11 LAB — TRIGLYCERIDES: Triglycerides: 70 mg/dL (ref <150)

## 2020-08-11 LAB — PROCALCITONIN: Procalcitonin: 1 ng/mL

## 2020-08-11 LAB — ABO/RH: ABO/RH(D): O NEG

## 2020-08-11 LAB — FIBRINOGEN: Fibrinogen: 767 mg/dL — ABNORMAL HIGH (ref 210–475)

## 2020-08-11 MED ORDER — POTASSIUM CHLORIDE IN NACL 40-0.9 MEQ/L-% IV SOLN
INTRAVENOUS | Status: DC
  Filled 2020-08-11 (×2): qty 1000

## 2020-08-11 MED ORDER — GUAIFENESIN-DM 100-10 MG/5ML PO SYRP
10.0000 mL | ORAL_SOLUTION | ORAL | Status: DC | PRN
  Administered 2020-08-23: 10 mL via ORAL
  Filled 2020-08-11 (×2): qty 10

## 2020-08-11 MED ORDER — ACETAMINOPHEN 325 MG PO TABS
650.0000 mg | ORAL_TABLET | Freq: Four times a day (QID) | ORAL | Status: DC | PRN
  Administered 2020-08-20 – 2020-09-11 (×5): 650 mg via ORAL
  Filled 2020-08-11 (×5): qty 2

## 2020-08-11 MED ORDER — HYDROCOD POLST-CPM POLST ER 10-8 MG/5ML PO SUER
5.0000 mL | Freq: Two times a day (BID) | ORAL | Status: DC | PRN
  Administered 2020-08-14 – 2020-08-16 (×3): 5 mL via ORAL
  Filled 2020-08-11 (×3): qty 5

## 2020-08-11 MED ORDER — HEPARIN SODIUM (PORCINE) 5000 UNIT/ML IJ SOLN
5000.0000 [IU] | Freq: Three times a day (TID) | INTRAMUSCULAR | Status: DC
  Administered 2020-08-12 – 2020-08-15 (×11): 5000 [IU] via SUBCUTANEOUS
  Filled 2020-08-11 (×11): qty 1

## 2020-08-11 MED ORDER — SODIUM CHLORIDE 0.9 % IV BOLUS
500.0000 mL | Freq: Once | INTRAVENOUS | Status: AC
  Administered 2020-08-11: 500 mL via INTRAVENOUS

## 2020-08-11 MED ORDER — INSULIN ASPART 100 UNIT/ML IJ SOLN
0.0000 [IU] | Freq: Every day | INTRAMUSCULAR | Status: DC
  Administered 2020-08-16: 2 [IU] via SUBCUTANEOUS

## 2020-08-11 MED ORDER — INSULIN DETEMIR 100 UNIT/ML ~~LOC~~ SOLN
0.1500 [IU]/kg | Freq: Two times a day (BID) | SUBCUTANEOUS | Status: DC
  Administered 2020-08-12: 14 [IU] via SUBCUTANEOUS
  Filled 2020-08-11 (×2): qty 0.14

## 2020-08-11 MED ORDER — INSULIN ASPART 100 UNIT/ML IJ SOLN
0.0000 [IU] | Freq: Three times a day (TID) | INTRAMUSCULAR | Status: DC
  Administered 2020-08-12: 11 [IU] via SUBCUTANEOUS
  Administered 2020-08-12: 4 [IU] via SUBCUTANEOUS
  Administered 2020-08-12: 7 [IU] via SUBCUTANEOUS
  Administered 2020-08-13: 4 [IU] via SUBCUTANEOUS
  Administered 2020-08-13: 3 [IU] via SUBCUTANEOUS
  Administered 2020-08-14 – 2020-08-15 (×2): 4 [IU] via SUBCUTANEOUS
  Administered 2020-08-15 (×2): 3 [IU] via SUBCUTANEOUS
  Administered 2020-08-16: 4 [IU] via SUBCUTANEOUS
  Administered 2020-08-16: 3 [IU] via SUBCUTANEOUS
  Administered 2020-08-16: 4 [IU] via SUBCUTANEOUS
  Administered 2020-08-17 – 2020-08-18 (×4): 3 [IU] via SUBCUTANEOUS
  Administered 2020-08-19: 4 [IU] via SUBCUTANEOUS
  Administered 2020-08-19 – 2020-08-20 (×2): 3 [IU] via SUBCUTANEOUS
  Administered 2020-08-20: 4 [IU] via SUBCUTANEOUS
  Administered 2020-08-20 – 2020-08-21 (×2): 3 [IU] via SUBCUTANEOUS
  Administered 2020-08-21: 4 [IU] via SUBCUTANEOUS
  Administered 2020-08-21 – 2020-08-22 (×2): 3 [IU] via SUBCUTANEOUS
  Administered 2020-08-22: 4 [IU] via SUBCUTANEOUS

## 2020-08-11 MED ORDER — DEXAMETHASONE SODIUM PHOSPHATE 10 MG/ML IJ SOLN
6.0000 mg | INTRAMUSCULAR | Status: DC
  Administered 2020-08-12: 6 mg via INTRAVENOUS
  Filled 2020-08-11: qty 1

## 2020-08-11 MED ORDER — POTASSIUM CHLORIDE CRYS ER 20 MEQ PO TBCR
40.0000 meq | EXTENDED_RELEASE_TABLET | Freq: Once | ORAL | Status: AC
  Administered 2020-08-11: 40 meq via ORAL
  Filled 2020-08-11: qty 2

## 2020-08-11 MED ORDER — ONDANSETRON HCL 4 MG/2ML IJ SOLN
4.0000 mg | Freq: Four times a day (QID) | INTRAMUSCULAR | Status: DC | PRN
  Administered 2020-08-15 – 2020-09-11 (×7): 4 mg via INTRAVENOUS
  Filled 2020-08-11 (×7): qty 2

## 2020-08-11 MED ORDER — POTASSIUM CHLORIDE 10 MEQ/100ML IV SOLN
10.0000 meq | Freq: Once | INTRAVENOUS | Status: AC
  Administered 2020-08-11: 10 meq via INTRAVENOUS
  Filled 2020-08-11: qty 100

## 2020-08-11 MED ORDER — ONDANSETRON HCL 4 MG PO TABS: 4.0000 mg | ORAL_TABLET | Freq: Four times a day (QID) | ORAL | Status: DC | PRN

## 2020-08-11 NOTE — ED Triage Notes (Signed)
BIB [facility] from home. Pt's son called EMS reporting pt was not eating and drinking and has developed a productive cough. Pt does report he  has had possible Flu/Covid exposure.  Hx of Bone Cancer and currently undergoing chemo tx.   vitals 125/57 HR-100 SpO2-100 room air 98.2 T

## 2020-08-11 NOTE — H&P (Signed)
History and Physical   Jeff Rodriguez JZP:[id number] DOB: [birth year] DOA: 08/11/2020  Referring MD/NP/PA: Dr. Tamera Punt  PCP: [facility]   Outpatient Specialists: None  Patient coming from: Home  Chief Complaint: Cough and shortness of breath  HPI: Jeff Rodriguez is a 75 y.o. male with medical history significant of multiple myeloma, diabetes, hypertension, diabetes, asthma, hyperlipidemia and osteoarthritis who was brought in by EMS secondary to productive cough, poor oral intake and not eating much.  Patient is currently undergoing chemo for his multiple myeloma.  His son actually called EMS.  Patient came to the ER and was not a good historian.  He reports his 4 to 5-day of the cough and congestion.  No fevers no vomiting.  Patient has had some loose stools.  Denies shortness of breath but just weak and poor appetite.  Patient reported recent exposure to both flu and the COVID virus.  He goes to [facility] hospital in [place] for treatment of his multiple myeloma.  In the ER here he was found to be COVID-19 positive.  Patient also found to be hyponatremic was hypokalemic hence significant AKI.  He also has pancytopenia probably related to his chemo.  LDH is 201 and no evidence of sepsis at this point.  He is being admitted to the hospital with hypokalemia, AKI as well as COVID-19 infection.  Patient has had COVID-19 immunization but not clear if he had booster shots or completed the shots.  ED Course: Temperature 99.1 blood pressure 106/76, pulse 104 respirate 24 oxygen sat 98% room air.  White count 3.7 hemoglobin 7.7 platelets 86.  Sodium 129 potassium 2.6 chloride 97 CO2 of 60 BUN is 67 creatinine 7.27 and calcium 6.8.  Lactic acid is 1.5.  Procalcitonin 1.0 LDH 201.  Chest x-ray shows no acute finding.  Review of Systems: As per HPI otherwise 10 point review of systems negative.    Past Medical History:  Diagnosis Date   Asthma    Diabetes mellitus    Hypertension      Past Surgical History:  Procedure Laterality Date   CHOLECYSTECTOMY N/A 04/23/2015   Procedure: LAPAROSCOPIC CHOLECYSTECTOMY WITH INTRAOPERATIVE CHOLANGIOGRAM;  Surgeon: Ralene Ok, MD;  Location: [facility];  Service: General;  Laterality: N/A;   REPLACEMENT TOTAL KNEE Bilateral    TOTAL HIP ARTHROPLASTY Right      reports that he does not drink alcohol and does not use drugs. No history on file for tobacco use.  Allergies  Allergen Reactions   Shrimp [Shellfish Allergy] Anaphylaxis   Iodine Swelling   Sulfa Antibiotics Other (See Comments)    other   Cephalexin Rash and Hives   Gabapentin Rash   Oxacillin Rash    Family History  Problem Relation Age of Onset   Cancer - Lung Father    Deep vein thrombosis Mother      Prior to Admission medications   Medication Sig Start Date End Date Taking? Authorizing Provider  acetaminophen (TYLENOL) 500 MG tablet Take 500 mg by mouth 4 (four) times daily. 08/12/09  Yes [provider]  albuterol (VENTOLIN HFA) 108 (90 Base) MCG/ACT inhaler Inhale 2 puffs into the lungs every 6 (six) hours as needed for wheezing or shortness of breath. 03/13/20  Yes [provider]  Calcium Carb-Cholecalciferol 600-400 MG-UNIT TABS Take 1 tablet by mouth daily. 06/04/20  Yes [provider]  carvedilol (COREG) 12.5 MG tablet Take 0.5 tablets by mouth daily. 06/16/20  Yes [provider]  dexamethasone (DECADRON)  4 MG tablet Take by mouth. 07/24/20  Yes [provider]  enoxaparin (LOVENOX) 40 MG/0.4ML injection Inject into the skin. 06/25/20  Yes [provider]  finasteride (PROSCAR) 5 MG tablet Take 1 tablet by mouth daily. 07/09/20  Yes [provider]  Insulin Glargine-yfgn 100 UNIT/ML SOPN Inject into the skin. 07/28/20  Yes [provider]  lenalidomide ([name]) 15 MG capsule Take by mouth. 07/24/20  Yes [provider]  lidocaine (LIDODERM) 5 % Place onto the skin. 07/28/20  Yes  [provider]  Menthol-Methyl Salicylate (THERA-GESIC) 0.5-15 % CREA Apply topically. 07/28/20  Yes [provider]  metFORMIN (GLUCOPHAGE) 1000 MG tablet Take 1 tablet by mouth 2 (two) times daily. 07/09/20  Yes [provider]  omeprazole (PRILOSEC) 40 MG capsule Take by mouth. 07/23/20  Yes [provider]  polyvinyl alcohol (LIQUIFILM TEARS) 1.4 % ophthalmic solution Apply to eye. 07/28/20  Yes [provider]  potassium chloride SA (KLOR-CON) 20 MEQ tablet Take by mouth. 07/28/20  Yes [provider]  rosuvastatin (CRESTOR) 40 MG tablet Take by mouth. 07/09/20  Yes [provider]  Sodium Chloride Flush (NORMAL SALINE FLUSH) 0.9 % SOLN Inject into the vein. 11/28/19  Yes [provider]  Zoledronic Acid 4 MG SOLR Inject 4 mg into the vein every 30 (thirty) days. 03/13/20  Yes [provider]  cyclobenzaprine (FLEXERIL) 5 MG tablet Take 5 mg by mouth 3 (three) times daily as needed for muscle spasms.    [provider]  diazepam (VALIUM) 5 MG tablet SMARTSIG:1 Tablet(s) By Mouth 05/07/20   [provider]  finasteride (PROSCAR) 5 MG tablet Take 5 mg by mouth daily.    [provider]  insulin glargine (LANTUS) 100 UNIT/ML injection Inject 0.2 mLs (20 Units total) into the skin at bedtime. 04/25/15   Delfina Redwood, MD  lisinopril (PRINIVIL,ZESTRIL) 20 MG tablet Take 10 mg by mouth daily.    [provider]  Oxycodone HCl 10 MG TABS Take 1 tablet (10 mg total) by mouth every 4 (four) hours as needed (for pain). 04/25/15   Kinsinger, Arta Bruce, MD  tamsulosin (FLOMAX) 0.4 MG CAPS capsule Take 0.8 mg by mouth at bedtime.     [provider]    Physical Exam: Vitals:   08/11/20 1730 08/11/20 1800 08/11/20 1830 08/11/20 2000  BP: 123/63 106/76 (!) 122/53 (!) 117/56  Pulse: 99 (!) 104 99 100  Resp: (!) 24 (!) 21 (!) 21 17  Temp:      TempSrc:      SpO2: 100% 99% 99% 98%   Weight:      Height:          Constitutional: Acutely ill looking no distress Vitals:   08/11/20 1730 08/11/20 1800 08/11/20 1830 08/11/20 2000  BP: 123/63 106/76 (!) 122/53 (!) 117/56  Pulse: 99 (!) 104 99 100  Resp: (!) 24 (!) 21 (!) 21 17  Temp:      TempSrc:      SpO2: 100% 99% 99% 98%  Weight:      Height:       Eyes: PERRL, lids and conjunctivae normal ENMT: Mucous membranes are dry. Posterior pharynx clear of any exudate or lesions.Normal dentition.  Neck: normal, supple, no masses, no thyromegaly Respiratory: Coarse breath sounds bilaterally, no wheezing, no crackles. Normal respiratory effort. No accessory muscle use.  Cardiovascular: Sinus tachycardia, no murmurs / rubs / gallops. No extremity edema. 2+ pedal pulses. No carotid  bruits.  Abdomen: no tenderness, no masses palpated. No hepatosplenomegaly. Bowel sounds positive.  Musculoskeletal: no clubbing / cyanosis. No joint deformity upper and lower extremities. Good ROM, no contractures. Normal muscle tone.  Skin: no rashes, lesions, ulcers. No induration Neurologic: CN 2-12 grossly intact. Sensation intact, DTR normal. Strength 5/5 in all 4.  Psychiatric: Confused but alert, no agitation.     Labs on Admission: I have personally reviewed following labs and imaging studies  CBC: Recent Labs  Lab 08/11/20 1627  WBC 3.7*  NEUTROABS 2.6  HGB 7.7*  HCT 22.8*  MCV 97.0  PLT 86*   Basic Metabolic Panel: Recent Labs  Lab 08/11/20 1627  NA 129*  K 2.6*  CL 97*  CO2 16*  GLUCOSE 75  BUN 67*  CREATININE 7.27*  CALCIUM 6.8*   GFR: Estimated Creatinine Clearance: 9.7 mL/min (A) (by C-G formula based on SCr of 7.27 mg/dL (H)). Liver Function Tests: Recent Labs  Lab 08/11/20 1627  AST 40  ALT 31  ALKPHOS 44  BILITOT 0.7  PROT 6.3*  ALBUMIN 2.7*   No results for input(s): LIPASE, AMYLASE in the last 168 hours. No results for input(s): AMMONIA in the last 168 hours. Coagulation Profile: No  results for input(s): INR, PROTIME in the last 168 hours. Cardiac Enzymes: No results for input(s): CKTOTAL, CKMB, CKMBINDEX, TROPONINI in the last 168 hours. BNP (last 3 results) No results for input(s): PROBNP in the last 8760 hours. HbA1C: No results for input(s): HGBA1C in the last 72 hours. CBG: No results for input(s): GLUCAP in the last 168 hours. Lipid Profile: Recent Labs    08/11/20 1550  TRIG 70   Thyroid Function Tests: No results for input(s): TSH, T4TOTAL, FREET4, T3FREE, THYROIDAB in the last 72 hours. Anemia Panel: Recent Labs    08/11/20 1550  FERRITIN 1,048*   Urine analysis:    Component Value Date/Time   COLORURINE AMBER (A) 04/22/2015 1408   APPEARANCEUR CLOUDY (A) 04/22/2015 1408   LABSPEC 1.026 04/22/2015 1408   PHURINE 6.0 04/22/2015 1408   GLUCOSEU NEGATIVE 04/22/2015 1408   HGBUR MODERATE (A) 04/22/2015 1408   BILIRUBINUR SMALL (A) 04/22/2015 1408   KETONESUR 15 (A) 04/22/2015 1408   PROTEINUR 100 (A) 04/22/2015 1408   NITRITE POSITIVE (A) 04/22/2015 1408   LEUKOCYTESUR SMALL (A) 04/22/2015 1408   Sepsis Labs: $[id number]$ (procalcitonin:4,lacticidven:4) ) Recent Results (from the past 240 hour(s))  Resp Panel by RT-PCR (Flu A&B, Covid) Nasopharyngeal Swab     Status: Abnormal   Collection Time: 08/11/20  3:50 PM   Specimen: Nasopharyngeal Swab; Nasopharyngeal(NP) swabs in vial transport medium  Result Value Ref Range Status   SARS Coronavirus 2 by RT PCR POSITIVE (A) NEGATIVE Final    Comment: RESULT CALLED TO, READ BACK BY AND VERIFIED WITH: M.BELFI AT 1949 ON 06.21.22 BY N.THOMPSON (NOTE) SARS-CoV-2 target nucleic acids are DETECTED.  The SARS-CoV-2 RNA is generally detectable in upper respiratory specimens during the acute phase of infection. Positive results are indicative of the presence of the identified virus, but do not rule out bacterial infection or co-infection with other pathogens not detected by the test. Clinical  correlation with patient history and other diagnostic information is necessary to determine patient infection status. The expected result is Negative.  Fact Sheet for Patients: EntrepreneurPulse.com.au  Fact Sheet for Healthcare Providers: IncredibleEmployment.be  This test is not yet approved or cleared by the [place] FDA and  has been authorized for detection and/or diagnosis of SARS-CoV-2 by FDA  under an Emergency Use Authorization (EUA).  This EUA will remain in effect (meaning this tes t can be used) for the duration of  the COVID-19 declaration under Section 564(b)(1) of the Act, 21 U.S.C. section 360bbb-3(b)(1), unless the authorization is terminated or revoked sooner.     Influenza A by PCR NEGATIVE NEGATIVE Final   Influenza B by PCR NEGATIVE NEGATIVE Final    Comment: (NOTE) The Xpert Xpress SARS-CoV-2/FLU/RSV plus assay is intended as an aid in the diagnosis of influenza from Nasopharyngeal swab specimens and should not be used as a sole basis for treatment. Nasal washings and aspirates are unacceptable for Xpert Xpress SARS-CoV-2/FLU/RSV testing.  Fact Sheet for Patients: EntrepreneurPulse.com.au  Fact Sheet for Healthcare Providers: IncredibleEmployment.be  This test is not yet approved or cleared by the [place] FDA and has been authorized for detection and/or diagnosis of SARS-CoV-2 by FDA under an Emergency Use Authorization (EUA). This EUA will remain in effect (meaning this test can be used) for the duration of the COVID-19 declaration under Section 564(b)(1) of the Act, 21 U.S.C. section 360bbb-3(b)(1), unless the authorization is terminated or revoked.  Performed at [facility], [place] [address], [place], [place] [address]      Radiological Exams on Admission: DG Chest Port 1 View  Result Date: 08/11/2020 CLINICAL DATA:  Cough. EXAM: PORTABLE CHEST 1  VIEW COMPARISON:  04/20/2015 FINDINGS: The lungs are clear without focal pneumonia, edema, pneumothorax or pleural effusion. The cardiopericardial silhouette is within normal limits for size. Degenerative changes noted in the right shoulder. Telemetry leads overlie the chest. IMPRESSION: No active disease. Electronically Signed   By: Misty Stanley M.D.   On: 08/11/2020 17:08      Assessment/Plan Principal Problem:   AKI (acute kidney injury) ([place]) Active Problems:   Hyperlipidemia   Diabetes mellitus with complication ([place])   Essential hypertension   COVID-19 virus infection   Hypokalemia   Pancytopenia ([facility])     #1 acute kidney injury: Patient's creatinine is more than 7.  This could be a reflection of his myeloma versus dehydration as a result of acute illness including COVID-19.  We will admit the patient.  Aggressive hydration.  Hold his lisinopril.  Follow renal function.  Renal ultrasound to rule out obstructive uropathy.  If no obstructive uropathy and no improvement will get nephrology consult.  #2 hypokalemia: Severe up with potassium 2.6.  Aggressive replacement and hydration.  #3 COVID-19 infection: Patient appears symptomatic with GI symptoms.  We will at this point consider dexamethasone.  Probably not a good choice for remdesivir due to his severe AKI.  Supportive care only.  No respiratory symptoms except for cough.  #4 pancytopenia: Secondary to chemotherapy for multiple myeloma.  He goes to the [place] for that.  Will defer to them for treatment  #5 diabetes: We will hold the metformin.  Also on Lantus insulin which we will continue with..  Sliding scale insulin for now especially with Decadron on board.  #6 essential hypertension: Continue with blood pressure control using home regimen.  #7 hyperlipidemia: Patient on Crestor.  We will continue with that.  #8 GERD: Continue with PPI  #9 history of obstructive uropathy: Continue with Proscar.  Renal ultrasound     DVT  prophylaxis: Heparin Code Status: Full code Family Communication: Son over the phone Disposition Plan: Home Consults called: None Admission status: Inpatient  Severity of Illness: The appropriate patient status for this patient is INPATIENT. Inpatient status is judged to be  reasonable and necessary in order to provide the required intensity of service to ensure the patient's safety. The patient's presenting symptoms, physical exam findings, and initial radiographic and laboratory data in the context of their chronic comorbidities is felt to place them at high risk for further clinical deterioration. Furthermore, it is not anticipated that the patient will be medically stable for discharge from the hospital within 2 midnights of admission. The following factors support the patient status of inpatient.   " The patient's presenting symptoms include cough and weakness. " The worrisome physical exam findings include dry mucous membranes. " The initial radiographic and laboratory data are worrisome because of creatinine of more than 7 and potassium of 2.0 with COVID-positive. " The chronic co-morbidities include multiple myeloma.   * I certify that at the point of admission it is my clinical judgment that the patient will require inpatient hospital care spanning beyond 2 midnights from the point of admission due to high intensity of service, high risk for further deterioration and high frequency of surveillance required.Barbette Merino MD [facility] Pager [phone]  If 7PM-7AM, please contact night-coverage [email or web address] Password [facility]  08/11/2020, 9:42 PM

## 2020-08-11 NOTE — ED Provider Notes (Signed)
[facility] DEPT Provider Note   CSN: [id number] Arrival date & time: 08/11/20  1511     History Chief Complaint  Patient presents with   Cough    Jeff Rodriguez is a 75 y.o. male.  Patient is a 75 year old male with a history of multiple myeloma, diabetes, hypertension, hyperlipidemia who presents with cough and weakness.  He reports a 4 to 5-day history of cough and congestion.  He has had no fevers.  No vomiting.  He has had some loose stools.  No reported shortness of breath.  He says he is just felt very weak and has not had a good appetite.  He has not been eating or drinking.  He does report that he has been exposed to both the flu and COVID recently.  He reports that he is currently on chemotherapy treatment for his multiple myeloma.  He is followed at the [facility] hospital in [place].      Past Medical History:  Diagnosis Date   Asthma    Diabetes mellitus    Hypertension     Patient Active Problem List   Diagnosis Date Noted   Cholecystitis, acute s/p lap chole 04/23/15 04/24/2015   Chest pain 04/20/2015   Chest wall pain 04/20/2015   DM2 (diabetes mellitus, type 2) ([place]) 04/20/2015   Hyperlipidemia 04/20/2015   BPH (benign prostatic hyperplasia) 04/20/2015   Diabetes mellitus with complication ([place])    Essential hypertension     Past Surgical History:  Procedure Laterality Date   CHOLECYSTECTOMY N/A 04/23/2015   Procedure: LAPAROSCOPIC CHOLECYSTECTOMY WITH INTRAOPERATIVE CHOLANGIOGRAM;  Surgeon: Ralene Ok, MD;  Location: [facility];  Service: General;  Laterality: N/A;   REPLACEMENT TOTAL KNEE Bilateral    TOTAL HIP ARTHROPLASTY Right        Family History  Problem Relation Age of Onset   Cancer - Lung Father    Deep vein thrombosis Mother     Social History   Tobacco Use   Smoking status: Never  Substance Use Topics   Alcohol use: No   Drug use: No    Home Medications Prior to Admission medications    Medication Sig Start Date End Date Taking? Authorizing Provider  aspirin 81 MG chewable tablet Chew 81 mg by mouth daily.    [provider]  cyclobenzaprine (FLEXERIL) 5 MG tablet Take 5 mg by mouth 3 (three) times daily as needed for muscle spasms.    [provider]  finasteride (PROSCAR) 5 MG tablet Take 5 mg by mouth daily.    [provider]  insulin glargine (LANTUS) 100 UNIT/ML injection Inject 0.2 mLs (20 Units total) into the skin at bedtime. 04/25/15   Delfina Redwood, MD  lisinopril (PRINIVIL,ZESTRIL) 20 MG tablet Take 10 mg by mouth daily.    [provider]  Oxycodone HCl 10 MG TABS Take 1 tablet (10 mg total) by mouth every 4 (four) hours as needed (for pain). 04/25/15   Kinsinger, Arta Bruce, MD  tamsulosin (FLOMAX) 0.4 MG CAPS capsule Take 0.8 mg by mouth at bedtime.     [provider]    Allergies    Shrimp [shellfish allergy]  Review of Systems   Review of Systems  Constitutional:  Positive for activity change, appetite change and fatigue. Negative for chills, diaphoresis and fever.  HENT:  Positive for congestion and rhinorrhea. Negative for sneezing.   Eyes: Negative.   Respiratory:  Positive for cough. Negative for chest tightness and shortness of  breath.   Cardiovascular:  Negative for chest pain and leg swelling.  Gastrointestinal:  Positive for diarrhea. Negative for abdominal pain, blood in stool, nausea and vomiting.  Genitourinary:  Negative for difficulty urinating, flank pain, frequency and hematuria.  Musculoskeletal:  Negative for arthralgias and back pain.  Skin:  Negative for rash.  Neurological:  Positive for weakness (Generalized) and light-headedness. Negative for dizziness, speech difficulty, numbness and headaches.   Physical Exam Updated Vital Signs BP (!) 117/56   Pulse 100   Temp 98.7 F (37.1 C) (Oral)   Resp 17   Ht _0  (1.727 m)   Wt 90.7 kg   SpO2 98%   BMI 30.40 kg/m   Physical  Exam Constitutional:      Appearance: He is well-developed. He is ill-appearing.  HENT:     Head: Normocephalic and atraumatic.  Eyes:     Pupils: Pupils are equal, round, and reactive to light.  Cardiovascular:     Rate and Rhythm: Regular rhythm. Tachycardia present.     Heart sounds: Normal heart sounds.  Pulmonary:     Effort: Pulmonary effort is normal. No respiratory distress.     Breath sounds: Normal breath sounds. No wheezing or rales.  Chest:     Chest wall: No tenderness.  Abdominal:     General: Bowel sounds are normal.     Palpations: Abdomen is soft.     Tenderness: There is no abdominal tenderness. There is no guarding or rebound.  Musculoskeletal:        General: Normal range of motion.     Cervical back: Normal range of motion and neck supple.  Lymphadenopathy:     Cervical: No cervical adenopathy.  Skin:    General: Skin is warm and dry.     Findings: No rash.  Neurological:     Mental Status: He is alert and oriented to person, place, and time.    ED Results / Procedures / Treatments   Labs (all labs ordered are listed, but only abnormal results are displayed) Labs Reviewed  RESP PANEL BY RT-PCR (FLU A&B, COVID) ARPGX2 - Abnormal; Notable for the following components:      Result Value   SARS Coronavirus 2 by RT PCR POSITIVE (*)    All other components within normal limits  CBC WITH DIFFERENTIAL/PLATELET - Abnormal; Notable for the following components:   WBC 3.7 (*)    RBC 2.35 (*)    Hemoglobin 7.7 (*)    HCT 22.8 (*)    RDW 15.9 (*)    Platelets 86 (*)    All other components within normal limits  COMPREHENSIVE METABOLIC PANEL - Abnormal; Notable for the following components:   Sodium 129 (*)    Potassium 2.6 (*)    Chloride 97 (*)    CO2 16 (*)    BUN 67 (*)    Creatinine, Ser 7.27 (*)    Calcium 6.8 (*)    Total Protein 6.3 (*)    Albumin 2.7 (*)    GFR, Estimated 7 (*)    Anion gap 16 (*)    All other components within normal limits   D-DIMER, QUANTITATIVE - Abnormal; Notable for the following components:   D-Dimer, Quant 2.76 (*)    All other components within normal limits  LACTATE DEHYDROGENASE - Abnormal; Notable for the following components:   LDH 201 (*)    All other components within normal limits  FERRITIN - Abnormal; Notable for the following components:  Ferritin 1,048 (*)    All other components within normal limits  FIBRINOGEN - Abnormal; Notable for the following components:   Fibrinogen 767 (*)    All other components within normal limits  C-REACTIVE PROTEIN - Abnormal; Notable for the following components:   CRP 24.1 (*)    All other components within normal limits  CULTURE, BLOOD (ROUTINE X 2)  CULTURE, BLOOD (ROUTINE X 2)  LACTIC ACID, PLASMA  LACTIC ACID, PLASMA  PROCALCITONIN  TRIGLYCERIDES  TYPE AND SCREEN  ABO/RH    EKG EKG Interpretation  Date/Time:  Tuesday August 11 2020 16:03:47 EDT Ventricular Rate:  100 PR Interval:  47 QRS Duration: 85 QT Interval:  354 QTC Calculation: 457 R Axis:   62 Text Interpretation: Sinus tachycardia Abnormal R-wave progression, early transition Borderline repolarization abnormality SINCE LAST TRACING HEART RATE HAS INCREASED Confirmed by Malvin Johns [phone]) on 08/11/2020 4:10:59 PM  Radiology DG Chest Port 1 View  Result Date: 08/11/2020 CLINICAL DATA:  Cough. EXAM: PORTABLE CHEST 1 VIEW COMPARISON:  04/20/2015 FINDINGS: The lungs are clear without focal pneumonia, edema, pneumothorax or pleural effusion. The cardiopericardial silhouette is within normal limits for size. Degenerative changes noted in the right shoulder. Telemetry leads overlie the chest. IMPRESSION: No active disease. Electronically Signed   By: Misty Stanley M.D.   On: 08/11/2020 17:08    Procedures Procedures   Medications Ordered in ED Medications  potassium chloride 10 mEq in 100 mL IVPB (10 mEq Intravenous New Bag/Given 08/11/20 2029)  sodium chloride 0.9 % bolus 500 mL  (500 mLs Intravenous Bolus 08/11/20 1646)  potassium chloride SA (KLOR-CON) CR tablet 40 mEq (40 mEq Oral Given 08/11/20 2028)    ED Course  I have reviewed the triage vital signs and the nursing notes.  Pertinent labs & imaging results that were available during my care of the patient were reviewed by me and considered in my medical decision making (see chart for details).    MDM Rules/Calculators/A&P                          Patient presents with upper respiratory symptoms.  He is COVID-positive.  He has no hypoxia.  His chest x-ray is clear.  He is mildly tachycardic.  His labs are concerning with a pancytopenia.  On review of records, it looks like his baseline hemoglobin is between 8 and 9 and today is 7.7.  His baseline creatinine is around 2 and today it is jumped up to 7.  He has some hypokalemia.  His potassium was replaced cautiously.  He was given some IV fluids cautiously.  I spoke with Dr. Jonelle Sidle who will admit the patient for further treatment.  Family was updated.  CRITICAL CARE Performed by: Malvin Johns Total critical care time: 70 minutes Critical care time was exclusive of separately billable procedures and treating other patients. Critical care was necessary to treat or prevent imminent or life-threatening deterioration. Critical care was time spent personally by me on the following activities: development of treatment plan with patient and/or surrogate as well as nursing, discussions with consultants, evaluation of patient's response to treatment, examination of patient, obtaining history from patient or surrogate, ordering and performing treatments and interventions, ordering and review of laboratory studies, ordering and review of radiographic studies, pulse oximetry and re-evaluation of patient's condition.  Final Clinical Impression(s) / ED Diagnoses Final diagnoses:  COVID-19 virus infection  Acute renal failure, unspecified acute renal failure type ([place])  Pancytopenia [facility])    Rx / DC Orders ED Discharge Orders     None        Malvin Johns, MD 08/11/20 2100

## 2020-08-12 ENCOUNTER — Inpatient Hospital Stay (HOSPITAL_COMMUNITY): Payer: No Typology Code available for payment source

## 2020-08-12 DIAGNOSIS — E8729 Other acidosis: Secondary | ICD-10-CM

## 2020-08-12 DIAGNOSIS — U071 COVID-19: Secondary | ICD-10-CM | POA: Diagnosis not present

## 2020-08-12 DIAGNOSIS — C9000 Multiple myeloma not having achieved remission: Secondary | ICD-10-CM | POA: Diagnosis present

## 2020-08-12 DIAGNOSIS — N179 Acute kidney failure, unspecified: Secondary | ICD-10-CM | POA: Diagnosis not present

## 2020-08-12 DIAGNOSIS — E872 Acidosis: Secondary | ICD-10-CM

## 2020-08-12 LAB — COMPREHENSIVE METABOLIC PANEL
ALT: 30 U/L (ref 0–44)
AST: 38 U/L (ref 15–41)
Albumin: 2.6 g/dL — ABNORMAL LOW (ref 3.5–5.0)
Alkaline Phosphatase: 41 U/L (ref 38–126)
Anion gap: 18 — ABNORMAL HIGH (ref 5–15)
BUN: 69 mg/dL — ABNORMAL HIGH (ref 8–23)
CO2: 13 mmol/L — ABNORMAL LOW (ref 22–32)
Calcium: 6.7 mg/dL — ABNORMAL LOW (ref 8.9–10.3)
Chloride: 100 mmol/L (ref 98–111)
Creatinine, Ser: 7.79 mg/dL — ABNORMAL HIGH (ref 0.61–1.24)
GFR, Estimated: 7 mL/min — ABNORMAL LOW (ref >60)
Glucose, Bld: 220 mg/dL — ABNORMAL HIGH (ref 70–99)
Potassium: 4 mmol/L (ref 3.5–5.1)
Sodium: 131 mmol/L — ABNORMAL LOW (ref 135–145)
Total Bilirubin: 0.8 mg/dL (ref 0.3–1.2)
Total Protein: 6.3 g/dL — ABNORMAL LOW (ref 6.5–8.1)

## 2020-08-12 LAB — CBC
HCT: 23 % — ABNORMAL LOW (ref 39.0–52.0)
Hemoglobin: 7.6 g/dL — ABNORMAL LOW (ref 13.0–17.0)
MCH: 32.8 pg (ref 26.0–34.0)
MCHC: 33 g/dL (ref 30.0–36.0)
MCV: 99.1 fL (ref 80.0–100.0)
Platelets: 77 10*3/uL — ABNORMAL LOW (ref 150–400)
RBC: 2.32 MIL/uL — ABNORMAL LOW (ref 4.22–5.81)
RDW: 16.2 % — ABNORMAL HIGH (ref 11.5–15.5)
WBC: 3.8 10*3/uL — ABNORMAL LOW (ref 4.0–10.5)
nRBC: 0 % (ref 0.0–0.2)

## 2020-08-12 LAB — CBC WITH DIFFERENTIAL/PLATELET
Abs Immature Granulocytes: 0.05 10*3/uL (ref 0.00–0.07)
Basophils Absolute: 0 10*3/uL (ref 0.0–0.1)
Basophils Relative: 0 %
Eosinophils Absolute: 0 10*3/uL (ref 0.0–0.5)
Eosinophils Relative: 0 %
HCT: 23.1 % — ABNORMAL LOW (ref 39.0–52.0)
Hemoglobin: 7.6 g/dL — ABNORMAL LOW (ref 13.0–17.0)
Immature Granulocytes: 1 %
Lymphocytes Relative: 10 %
Lymphs Abs: 0.4 10*3/uL — ABNORMAL LOW (ref 0.7–4.0)
MCH: 32.6 pg (ref 26.0–34.0)
MCHC: 32.9 g/dL (ref 30.0–36.0)
MCV: 99.1 fL (ref 80.0–100.0)
Monocytes Absolute: 0.3 10*3/uL (ref 0.1–1.0)
Monocytes Relative: 7 %
Neutro Abs: 3.1 10*3/uL (ref 1.7–7.7)
Neutrophils Relative %: 82 %
Platelets: 77 10*3/uL — ABNORMAL LOW (ref 150–400)
RBC: 2.33 MIL/uL — ABNORMAL LOW (ref 4.22–5.81)
RDW: 16.4 % — ABNORMAL HIGH (ref 11.5–15.5)
WBC: 3.9 10*3/uL — ABNORMAL LOW (ref 4.0–10.5)
nRBC: 0 % (ref 0.0–0.2)

## 2020-08-12 LAB — GLUCOSE, CAPILLARY
Glucose-Capillary: 109 mg/dL — ABNORMAL HIGH (ref 70–99)
Glucose-Capillary: 155 mg/dL — ABNORMAL HIGH (ref 70–99)
Glucose-Capillary: 186 mg/dL — ABNORMAL HIGH (ref 70–99)
Glucose-Capillary: 232 mg/dL — ABNORMAL HIGH (ref 70–99)
Glucose-Capillary: 263 mg/dL — ABNORMAL HIGH (ref 70–99)
Glucose-Capillary: 64 mg/dL — ABNORMAL LOW (ref 70–99)

## 2020-08-12 LAB — C-REACTIVE PROTEIN: CRP: 26.5 mg/dL — ABNORMAL HIGH (ref <1.0)

## 2020-08-12 LAB — HEMOGLOBIN A1C
Hgb A1c MFr Bld: 8.4 % — ABNORMAL HIGH (ref 4.8–5.6)
Mean Plasma Glucose: 194.38 mg/dL

## 2020-08-12 LAB — MAGNESIUM: Magnesium: 1.7 mg/dL (ref 1.7–2.4)

## 2020-08-12 LAB — CREATININE, SERUM
Creatinine, Ser: 7.64 mg/dL — ABNORMAL HIGH (ref 0.61–1.24)
GFR, Estimated: 7 mL/min — ABNORMAL LOW (ref >60)

## 2020-08-12 LAB — D-DIMER, QUANTITATIVE: D-Dimer, Quant: 3.43 ug/mL-FEU — ABNORMAL HIGH (ref 0.00–0.50)

## 2020-08-12 LAB — FERRITIN: Ferritin: 996 ng/mL — ABNORMAL HIGH (ref 24–336)

## 2020-08-12 LAB — PHOSPHORUS: Phosphorus: 5.3 mg/dL — ABNORMAL HIGH (ref 2.5–4.6)

## 2020-08-12 MED ORDER — CARVEDILOL 6.25 MG PO TABS
6.2500 mg | ORAL_TABLET | Freq: Every day | ORAL | Status: DC
  Administered 2020-08-12: 6.25 mg via ORAL
  Filled 2020-08-12: qty 1

## 2020-08-12 MED ORDER — NEPRO/CARBSTEADY PO LIQD
237.0000 mL | Freq: Two times a day (BID) | ORAL | Status: DC
  Administered 2020-08-12 – 2020-08-16 (×7): 237 mL via ORAL
  Filled 2020-08-12 (×13): qty 237

## 2020-08-12 MED ORDER — ROSUVASTATIN CALCIUM 10 MG PO TABS
10.0000 mg | ORAL_TABLET | Freq: Every day | ORAL | Status: DC
  Administered 2020-08-12: 10 mg via ORAL
  Filled 2020-08-12: qty 1

## 2020-08-12 MED ORDER — LACTATED RINGERS IV BOLUS
1000.0000 mL | Freq: Once | INTRAVENOUS | Status: AC
  Administered 2020-08-12: 1000 mL via INTRAVENOUS

## 2020-08-12 MED ORDER — SODIUM CHLORIDE 0.9 % IV SOLN
200.0000 mg | Freq: Once | INTRAVENOUS | Status: AC
  Administered 2020-08-12: 200 mg via INTRAVENOUS
  Filled 2020-08-12: qty 40

## 2020-08-12 MED ORDER — TAMSULOSIN HCL 0.4 MG PO CAPS
0.8000 mg | ORAL_CAPSULE | Freq: Every day | ORAL | Status: DC
  Administered 2020-08-12 – 2020-08-14 (×4): 0.8 mg via ORAL
  Filled 2020-08-12 (×4): qty 2

## 2020-08-12 MED ORDER — SODIUM CHLORIDE 0.9 % IV SOLN
100.0000 mg | Freq: Every day | INTRAVENOUS | Status: AC
  Administered 2020-08-13 – 2020-08-16 (×4): 100 mg via INTRAVENOUS
  Filled 2020-08-12 (×4): qty 20

## 2020-08-12 MED ORDER — FINASTERIDE 5 MG PO TABS
5.0000 mg | ORAL_TABLET | Freq: Every day | ORAL | Status: DC
  Administered 2020-08-12 – 2020-09-11 (×30): 5 mg via ORAL
  Filled 2020-08-12 (×31): qty 1

## 2020-08-12 MED ORDER — STERILE WATER FOR INJECTION IV SOLN
INTRAVENOUS | Status: DC
  Filled 2020-08-12: qty 150
  Filled 2020-08-12: qty 1000
  Filled 2020-08-12 (×2): qty 150

## 2020-08-12 MED ORDER — PROSOURCE PLUS PO LIQD
30.0000 mL | Freq: Two times a day (BID) | ORAL | Status: DC
  Administered 2020-08-12 – 2020-08-17 (×9): 30 mL via ORAL
  Filled 2020-08-12 (×10): qty 30

## 2020-08-12 MED ORDER — LIDOCAINE 5 % EX PTCH
1.0000 | MEDICATED_PATCH | CUTANEOUS | Status: DC
  Administered 2020-08-12 – 2020-09-10 (×24): 1 via TRANSDERMAL
  Filled 2020-08-12 (×30): qty 1

## 2020-08-12 MED ORDER — LENALIDOMIDE 15 MG PO CAPS: 15.0000 mg | ORAL_CAPSULE | Freq: Every day | ORAL | Status: DC

## 2020-08-12 MED ORDER — ALBUTEROL SULFATE HFA 108 (90 BASE) MCG/ACT IN AERS
2.0000 | INHALATION_SPRAY | Freq: Four times a day (QID) | RESPIRATORY_TRACT | Status: DC | PRN
  Administered 2020-08-28 – 2020-09-03 (×2): 2 via RESPIRATORY_TRACT
  Filled 2020-08-12: qty 6.7

## 2020-08-12 NOTE — Progress Notes (Signed)
Progress Note    Jeff Rodriguez   FXO:[id number]  DOB: [birth year]  DOA: 08/11/2020     1  PCP: [facility]  CC: cough, SOB  Hospital Course: Jeff Rodriguez is a 75 y.o. male with medical history significant of multiple myeloma (on [name]), diabetes, hypertension, asthma, hyperlipidemia and osteoarthritis who was brought in by EMS secondary to productive cough, poor oral intake and not eating much.  Patient is currently undergoing chemo for his multiple myeloma.  Patient was a poor historian on admission.  He lives alone with 1 son living locally and another son that calls to check on him consistently.  He is a patient of the [facility] and managed there in regards to his multiple myeloma.  He underwent work-up in the ER and tested positive for COVID-19.  He was also found to be hyponatremic, hypokalemic, and significant renal failure.  He was started on fluids and admitted for further work-up.  Interval History:  Resting in bed when seen this morning very lethargic.  Still a poor historian and could not tell me how long he has been on treatment for multiple myeloma. He does endorse that he has not been eating or drinking well at home for several days while he has been feeling better.  ROS: Constitutional: negative for fevers, Respiratory: negative for cough and sputum, Cardiovascular: negative for chest pain, and Gastrointestinal: negative for abdominal pain  Assessment & Plan: * AKI (acute kidney injury) ([place]) - No recent labs to review but renal function in January 2022 was creatinine 1.74, BUN 27, GFR 44 -Presents with creatinine 7.27, BUN 67 -Wide differential for etiology but possibly/hopefully due to prolonged prerenal from poor oral intake which may have led to an ATN; otherwise could also be related to underlying multiple myeloma or COVID-related - ACEi on hold; renal u/s negative for obstruction - appreciate nephrology involvement as well given his  large change in renal fxn - follow up further pending labs  Increased anion gap metabolic acidosis - etiology due to renal failure - change IVF to bicarb drip on 6/22 - trend BMP  COVID-19 virus infection - main symptoms are lethargy/malaise, poor oral intake. No respiratory sxms - CT value 22 - given hx MM and treatment for it, will go ahead and initiate remdesivir while inpatient - trend inflammatory markers - no indication for steroids at this time  Multiple myeloma (HCC) - on [name] - follows at [facility] - currently [name] on hold per pharmacy - if renal function does not improve with IVF, may need to involve heme/onc for further workup  Pancytopenia ([place]) - presumed from chemo - trend CBC  Hypokalemia - treated - trend BMP  Essential hypertension - continue current regimen  Diabetes mellitus with complication ([place]) - continue SSI and CBG monitoring   Hyperlipidemia - hold statin for now   Old records reviewed in assessment of this patient  Antimicrobials: Remdesivir 6/22 >> current  DVT prophylaxis: heparin injection 5,000 Units Start: 08/11/20 2315   Code Status:   Code Status: Full Code Family Communication: son  Disposition Plan: Status is: Inpatient  Remains inpatient appropriate because:IV treatments appropriate due to intensity of illness or inability to take PO and Inpatient level of care appropriate due to severity of illness  Dispo: The patient is from: Home              Anticipated d/c is to:  pending PT eval  Patient currently is not medically stable to d/c.   Difficult to place patient No      Risk of unplanned readmission score: Unplanned Admission- Pilot do not use: 26.52   Objective: Blood pressure 126/68, pulse 91, temperature 97.7 F (36.5 C), temperature source Oral, resp. rate 20, height _0  (1.727 m), weight 63.1 kg, SpO2 100 %.  Examination: General appearance:  Elderly gentleman in no distress but  appears very fatigued Head: Normocephalic, without obvious abnormality, atraumatic Eyes:  EOMI Lungs: clear to auscultation bilaterally Heart: regular rate and rhythm and S1, S2 normal Abdomen: normal findings: bowel sounds normal and soft, non-tender Extremities:  No edema Skin:  Diffuse dry skin Neurologic: Grossly normal  Consultants:  Nephrology  Procedures:    Data Reviewed: I have personally reviewed following labs and imaging studies Results for orders placed or performed during the hospital encounter of 08/11/20 (from the past 24 hour(s))  Resp Panel by RT-PCR (Flu A&B, Covid) Nasopharyngeal Swab     Status: Abnormal   Collection Time: 08/11/20  3:50 PM   Specimen: Nasopharyngeal Swab; Nasopharyngeal(NP) swabs in vial transport medium  Result Value Ref Range   SARS Coronavirus 2 by RT PCR POSITIVE (A) NEGATIVE   Influenza A by PCR NEGATIVE NEGATIVE   Influenza B by PCR NEGATIVE NEGATIVE  Lactic acid, plasma     Status: None   Collection Time: 08/11/20  3:50 PM  Result Value Ref Range   Lactic Acid, Venous 1.1 0.5 - 1.9 mmol/L  Blood Culture (routine x 2)     Status: None (Preliminary result)   Collection Time: 08/11/20  3:50 PM   Specimen: BLOOD  Result Value Ref Range   Specimen Description      BLOOD BLOOD RIGHT FOREARM Performed at [facility], [address] [place], [place] [address]    Special Requests      BOTTLES DRAWN AEROBIC AND ANAEROBIC Blood Culture adequate volume Performed at [facility] [address], [place], [place] [address]    Culture      NO GROWTH < 12 HOURS Performed at [facility] [address] [place], [place] [address]    Report Status PENDING   Ferritin     Status: Abnormal   Collection Time: 08/11/20  3:50 PM  Result Value Ref Range   Ferritin 1,048 (H) 24 - 336 ng/mL  Triglycerides     Status: None   Collection Time: 08/11/20  3:50 PM  Result Value Ref Range   Triglycerides  70 <150 mg/dL  C-reactive protein     Status: Abnormal   Collection Time: 08/11/20  3:50 PM  Result Value Ref Range   CRP 24.1 (H) <1.0 mg/dL  Blood Culture (routine x 2)     Status: None (Preliminary result)   Collection Time: 08/11/20  3:55 PM   Specimen: BLOOD  Result Value Ref Range   Specimen Description      BLOOD BLOOD LEFT FOREARM Performed at [facility], [place] [address], [place], [place] [address]    Special Requests      BOTTLES DRAWN AEROBIC AND ANAEROBIC Blood Culture adequate volume Performed at [facility] [address] [place],  [address]    Culture      NO GROWTH < 12 HOURS Performed at [facility] [address], [place],  [address]    Report Status PENDING   CBC WITH DIFFERENTIAL     Status:  Abnormal   Collection Time: 08/11/20  4:27 PM  Result Value Ref Range   WBC 3.7 (L) 4.0 - 10.5 K/uL   RBC 2.35 (L) 4.22 - 5.81 MIL/uL   Hemoglobin 7.7 (L) 13.0 - 17.0 g/dL   HCT 22.8 (L) 39.0 - 52.0 %   MCV 97.0 80.0 - 100.0 fL   MCH 32.8 26.0 - 34.0 pg   MCHC 33.8 30.0 - 36.0 g/dL   RDW 15.9 (H) 11.5 - 15.5 %   Platelets 86 (L) 150 - 400 K/uL   nRBC 0.0 0.0 - 0.2 %   Neutrophils Relative % 71 %   Neutro Abs 2.6 1.7 - 7.7 K/uL   Lymphocytes Relative 18 %   Lymphs Abs 0.7 0.7 - 4.0 K/uL   Monocytes Relative 8 %   Monocytes Absolute 0.3 0.1 - 1.0 K/uL   Eosinophils Relative 1 %   Eosinophils Absolute 0.1 0.0 - 0.5 K/uL   Basophils Relative 1 %   Basophils Absolute 0.0 0.0 - 0.1 K/uL   Immature Granulocytes 1 %   Abs Immature Granulocytes 0.02 0.00 - 0.07 K/uL   Burr Cells PRESENT   Comprehensive metabolic panel     Status: Abnormal   Collection Time: 08/11/20  4:27 PM  Result Value Ref Range   Sodium 129 (L) 135 - 145 mmol/L   Potassium 2.6 (LL) 3.5 - 5.1 mmol/L   Chloride 97 (L) 98 - 111 mmol/L   CO2 16 (L) 22 - 32 mmol/L   Glucose, Bld 75 70 - 99 mg/dL   BUN 67 (H) 8 - 23 mg/dL   Creatinine,  Ser 7.27 (H) 0.61 - 1.24 mg/dL   Calcium 6.8 (L) 8.9 - 10.3 mg/dL   Total Protein 6.3 (L) 6.5 - 8.1 g/dL   Albumin 2.7 (L) 3.5 - 5.0 g/dL   AST 40 15 - 41 U/L   ALT 31 0 - 44 U/L   Alkaline Phosphatase 44 38 - 126 U/L   Total Bilirubin 0.7 0.3 - 1.2 mg/dL   GFR, Estimated 7 (L) >60 mL/min   Anion gap 16 (H) 5 - 15  D-dimer, quantitative     Status: Abnormal   Collection Time: 08/11/20  4:27 PM  Result Value Ref Range   D-Dimer, Quant 2.76 (H) 0.00 - 0.50 ug/mL-FEU  Procalcitonin     Status: None   Collection Time: 08/11/20  4:27 PM  Result Value Ref Range   Procalcitonin 1.00 ng/mL  Lactate dehydrogenase     Status: Abnormal   Collection Time: 08/11/20  4:27 PM  Result Value Ref Range   LDH 201 (H) 98 - 192 U/L  Fibrinogen     Status: Abnormal   Collection Time: 08/11/20  4:27 PM  Result Value Ref Range   Fibrinogen 767 (H) 210 - 475 mg/dL  Lactic acid, plasma     Status: None   Collection Time: 08/11/20  5:50 PM  Result Value Ref Range   Lactic Acid, Venous 1.5 0.5 - 1.9 mmol/L  Type and screen [place]     Status: None   Collection Time: 08/11/20  5:50 PM  Result Value Ref Range   ABO/RH(D) O NEG    Antibody Screen NEG    Sample Expiration      08/14/2020,2359 Performed at [facility], [place] [address] [place], [place] [address]   ABO/Rh     Status: None   Collection Time: 08/11/20  5:57 PM  Result Value Ref  Range   ABO/RH(D)      Jenetta Downer NEG Performed at [facility] [address], [place],  [address]   Glucose, capillary     Status: Abnormal   Collection Time: 08/12/20 12:05 AM  Result Value Ref Range   Glucose-Capillary 64 (L) 70 - 99 mg/dL  Glucose, capillary     Status: Abnormal   Collection Time: 08/12/20  1:22 AM  Result Value Ref Range   Glucose-Capillary 109 (H) 70 - 99 mg/dL  CBC     Status: Abnormal   Collection Time: 08/12/20  3:38 AM  Result Value Ref Range   WBC 3.8 (L) 4.0 - 10.5  K/uL   RBC 2.32 (L) 4.22 - 5.81 MIL/uL   Hemoglobin 7.6 (L) 13.0 - 17.0 g/dL   HCT 23.0 (L) 39.0 - 52.0 %   MCV 99.1 80.0 - 100.0 fL   MCH 32.8 26.0 - 34.0 pg   MCHC 33.0 30.0 - 36.0 g/dL   RDW 16.2 (H) 11.5 - 15.5 %   Platelets 77 (L) 150 - 400 K/uL   nRBC 0.0 0.0 - 0.2 %  Creatinine, serum     Status: Abnormal   Collection Time: 08/12/20  3:38 AM  Result Value Ref Range   Creatinine, Ser 7.64 (H) 0.61 - 1.24 mg/dL   GFR, Estimated 7 (L) >60 mL/min  CBC with Differential/Platelet     Status: Abnormal   Collection Time: 08/12/20  3:38 AM  Result Value Ref Range   WBC 3.9 (L) 4.0 - 10.5 K/uL   RBC 2.33 (L) 4.22 - 5.81 MIL/uL   Hemoglobin 7.6 (L) 13.0 - 17.0 g/dL   HCT 23.1 (L) 39.0 - 52.0 %   MCV 99.1 80.0 - 100.0 fL   MCH 32.6 26.0 - 34.0 pg   MCHC 32.9 30.0 - 36.0 g/dL   RDW 16.4 (H) 11.5 - 15.5 %   Platelets 77 (L) 150 - 400 K/uL   nRBC 0.0 0.0 - 0.2 %   Neutrophils Relative % 82 %   Neutro Abs 3.1 1.7 - 7.7 K/uL   Lymphocytes Relative 10 %   Lymphs Abs 0.4 (L) 0.7 - 4.0 K/uL   Monocytes Relative 7 %   Monocytes Absolute 0.3 0.1 - 1.0 K/uL   Eosinophils Relative 0 %   Eosinophils Absolute 0.0 0.0 - 0.5 K/uL   Basophils Relative 0 %   Basophils Absolute 0.0 0.0 - 0.1 K/uL   Immature Granulocytes 1 %   Abs Immature Granulocytes 0.05 0.00 - 0.07 K/uL  Comprehensive metabolic panel     Status: Abnormal   Collection Time: 08/12/20  3:38 AM  Result Value Ref Range   Sodium 131 (L) 135 - 145 mmol/L   Potassium 4.0 3.5 - 5.1 mmol/L   Chloride 100 98 - 111 mmol/L   CO2 13 (L) 22 - 32 mmol/L   Glucose, Bld 220 (H) 70 - 99 mg/dL   BUN 69 (H) 8 - 23 mg/dL   Creatinine, Ser 7.79 (H) 0.61 - 1.24 mg/dL   Calcium 6.7 (L) 8.9 - 10.3 mg/dL   Total Protein 6.3 (L) 6.5 - 8.1 g/dL   Albumin 2.6 (L) 3.5 - 5.0 g/dL   AST 38 15 - 41 U/L   ALT 30 0 - 44 U/L   Alkaline Phosphatase 41 38 - 126 U/L   Total Bilirubin 0.8 0.3 - 1.2 mg/dL   GFR, Estimated 7 (L) >60 mL/min   Anion gap 18  (H) 5 -  15  C-reactive protein     Status: Abnormal   Collection Time: 08/12/20  3:38 AM  Result Value Ref Range   CRP 26.5 (H) <1.0 mg/dL  D-dimer, quantitative     Status: Abnormal   Collection Time: 08/12/20  3:38 AM  Result Value Ref Range   D-Dimer, Quant 3.43 (H) 0.00 - 0.50 ug/mL-FEU  Ferritin     Status: Abnormal   Collection Time: 08/12/20  3:38 AM  Result Value Ref Range   Ferritin 996 (H) 24 - 336 ng/mL  Magnesium     Status: None   Collection Time: 08/12/20  3:38 AM  Result Value Ref Range   Magnesium 1.7 1.7 - 2.4 mg/dL  Phosphorus     Status: Abnormal   Collection Time: 08/12/20  3:38 AM  Result Value Ref Range   Phosphorus 5.3 (H) 2.5 - 4.6 mg/dL  Hemoglobin A1c     Status: Abnormal   Collection Time: 08/12/20  3:38 AM  Result Value Ref Range   Hgb A1c MFr Bld 8.4 (H) 4.8 - 5.6 %   Mean Plasma Glucose 194.38 mg/dL  Glucose, capillary     Status: Abnormal   Collection Time: 08/12/20  8:14 AM  Result Value Ref Range   Glucose-Capillary 232 (H) 70 - 99 mg/dL  Glucose, capillary     Status: Abnormal   Collection Time: 08/12/20 12:29 PM  Result Value Ref Range   Glucose-Capillary 263 (H) 70 - 99 mg/dL    Recent Results (from the past 240 hour(s))  Resp Panel by RT-PCR (Flu A&B, Covid) Nasopharyngeal Swab     Status: Abnormal   Collection Time: 08/11/20  3:50 PM   Specimen: Nasopharyngeal Swab; Nasopharyngeal(NP) swabs in vial transport medium  Result Value Ref Range Status   SARS Coronavirus 2 by RT PCR POSITIVE (A) NEGATIVE Final    Comment: RESULT CALLED TO, READ BACK BY AND VERIFIED WITH: M.BELFI AT 1949 ON 06.21.22 BY N.THOMPSON (NOTE) SARS-CoV-2 target nucleic acids are DETECTED.  The SARS-CoV-2 RNA is generally detectable in upper respiratory specimens during the acute phase of infection. Positive results are indicative of the presence of the identified virus, but do not rule out bacterial infection or co-infection with other pathogens not detected by  the test. Clinical correlation with patient history and other diagnostic information is necessary to determine patient infection status. The expected result is Negative.  Fact Sheet for Patients: EntrepreneurPulse.com.au  Fact Sheet for Healthcare Providers: IncredibleEmployment.be  This test is not yet approved or cleared by the [place] FDA and  has been authorized for detection and/or diagnosis of SARS-CoV-2 by FDA under an Emergency Use Authorization (EUA).  This EUA will remain in effect (meaning this tes t can be used) for the duration of  the COVID-19 declaration under Section 564(b)(1) of the Act, 21 U.S.C. section 360bbb-3(b)(1), unless the authorization is terminated or revoked sooner.     Influenza A by PCR NEGATIVE NEGATIVE Final   Influenza B by PCR NEGATIVE NEGATIVE Final    Comment: (NOTE) The Xpert Xpress SARS-CoV-2/FLU/RSV plus assay is intended as an aid in the diagnosis of influenza from Nasopharyngeal swab specimens and should not be used as a sole basis for treatment. Nasal washings and aspirates are unacceptable for Xpert Xpress SARS-CoV-2/FLU/RSV testing.  Fact Sheet for Patients: EntrepreneurPulse.com.au  Fact Sheet for Healthcare Providers: IncredibleEmployment.be  This test is not yet approved or cleared by the [place] FDA and has been authorized for detection and/or diagnosis of SARS-CoV-2  by FDA under an Emergency Use Authorization (EUA). This EUA will remain in effect (meaning this test can be used) for the duration of the COVID-19 declaration under Section 564(b)(1) of the Act, 21 U.S.C. section 360bbb-3(b)(1), unless the authorization is terminated or revoked.  Performed at [facility], [place] [address] [place], [place] [address]   Blood Culture (routine x 2)     Status: None (Preliminary result)   Collection Time: 08/11/20  3:50 PM    Specimen: BLOOD  Result Value Ref Range Status   Specimen Description   Final    BLOOD BLOOD RIGHT FOREARM Performed at [facility] [address] [place], [place] [address]    Special Requests   Final    BOTTLES DRAWN AEROBIC AND ANAEROBIC Blood Culture adequate volume Performed at [facility] [address] [place], [place] [address]    Culture   Final    NO GROWTH < 12 HOURS Performed at [facility] [address] [place], [place] [address]    Report Status PENDING  Incomplete  Blood Culture (routine x 2)     Status: None (Preliminary result)   Collection Time: 08/11/20  3:55 PM   Specimen: BLOOD  Result Value Ref Range Status   Specimen Description   Final    BLOOD BLOOD LEFT FOREARM Performed at [facility] [address], [place], [place] [address]    Special Requests   Final    BOTTLES DRAWN AEROBIC AND ANAEROBIC Blood Culture adequate volume Performed at [facility] [address] [place], [place] [address]    Culture   Final    NO GROWTH < 12 HOURS Performed at [facility] [address] [place],  [address]    Report Status PENDING  Incomplete     Radiology Studies: US RENAL  Result Date: 08/12/2020 CLINICAL DATA:  Acute renal failure. EXAM: RENAL / URINARY TRACT ULTRASOUND COMPLETE COMPARISON:  CT 06/04/2010. FINDINGS: Right Kidney: Renal measurements: 11.6 x 5.5 x 5.9 cm = volume: 197.5 mL. Increased echogenicity. No mass or hydronephrosis visualized. Left Kidney: Renal measurements: 11.5 x 6.4 x 5.3 cm = volume: 204.9 mL. Increased echogenicity. No mass or hydronephrosis visualized. Bladder: Appears normal for degree of bladder distention. Other: The prostate is enlarged at 6.1 x 3.9 x 5.3 cm. The prostate is irregular in contour. IMPRESSION: 1. Increased echogenicity both kidneys consistent chronic medical renal disease. No acute abnormality identified. No  hydronephrosis or bladder distention. 2.  The prostate is enlarged and irregular in contour. Electronically Signed   By: Marcello Moores  Register   On: 08/12/2020 07:15   DG Chest Port 1 View  Result Date: 08/11/2020 CLINICAL DATA:  Cough. EXAM: PORTABLE CHEST 1 VIEW COMPARISON:  04/20/2015 FINDINGS: The lungs are clear without focal pneumonia, edema, pneumothorax or pleural effusion. The cardiopericardial silhouette is within normal limits for size. Degenerative changes noted in the right shoulder. Telemetry leads overlie the chest. IMPRESSION: No active disease. Electronically Signed   By: Misty Stanley M.D.   On: 08/11/2020 17:08   US RENAL  Final Result    DG Chest [facility] 1 View  Final Result      Scheduled Meds:  (feeding supplement) PROSource Plus  30 mL Oral BID BM   carvedilol  6.25 mg Oral Q breakfast   dexamethasone (DECADRON) injection  6 mg Intravenous Q24H   feeding supplement (NEPRO CARB STEADY)  237 mL Oral BID  BM   finasteride  5 mg Oral Daily   heparin  5,000 Units Subcutaneous Q8H   insulin aspart  0-20 Units Subcutaneous TID WC   insulin aspart  0-5 Units Subcutaneous QHS   lidocaine  1 patch Transdermal Q24H   rosuvastatin  10 mg Oral Daily   tamsulosin  0.8 mg Oral QHS   PRN Meds: acetaminophen, albuterol, chlorpheniramine-HYDROcodone, guaiFENesin-dextromethorphan, ondansetron **OR** ondansetron (ZOFRAN) IV Continuous Infusions:   sodium bicarbonate (isotonic) infusion in sterile water       LOS: 1 day  Time spent: Greater than 50% of the 35 minute visit was spent in counseling/coordination of care for the patient as laid out in the A&P.   Dwyane Dee, MD [facility] 08/12/2020, 2:39 PM

## 2020-08-12 NOTE — Hospital Course (Signed)
Jeff Rodriguez is a 75 y.o. male with medical history significant of multiple myeloma (on [name]), diabetes, hypertension, asthma, hyperlipidemia and osteoarthritis who was brought in by EMS secondary to productive cough, poor oral intake and not eating much.  Patient is currently undergoing chemo for his multiple myeloma.  Patient was a poor historian on admission.  He lives alone with 1 son living locally and another son that calls to check on him consistently.  He is a patient of the [facility] and managed there in regards to his multiple myeloma.  He underwent work-up in the ER and tested positive for COVID-19.  He was also found to be hyponatremic, hypokalemic, and significant renal failure.  He was started on fluids and admitted for further work-up.

## 2020-08-12 NOTE — Assessment & Plan Note (Signed)
-   hold statin for now

## 2020-08-12 NOTE — Assessment & Plan Note (Addendum)
-   on [name] - follows at [facility] - currently [name] on hold per pharmacy - heme/onc consulted on 6/24 due to minimal renal improvement to further weigh in; no monocolonal spike per oncology

## 2020-08-12 NOTE — Assessment & Plan Note (Addendum)
-  No recent labs to review but renal function in January 2022 was creatinine 1.74, BUN 27, GFR 44 -Presents with creatinine 7.27, BUN 67 -Wide differential for etiology but possibly/hopefully due to prolonged prerenal from poor oral intake which may have led to an ATN; otherwise could also be related to underlying multiple myeloma (less likely per oncology) or COVID-related - ACEi on hold; renal u/s negative for obstruction - appreciate nephrology involvement as well given his large change in renal fxn - continue IVF for now; stopped bicarb and started LR (acidosis improved) - unfortunately not much renal improvement and he is growing weaker daily due to not eating hardly anything (see separate problem); he has no uremic signs yet but BUN continues to climb daily - he is now on the fence about HD; I tried discussing DNR when he was declining HD but now after our discussion he's undecided on HD b/c I told him if declining HD and it was needed then this would not align with his code status and goals of care - given worsening decline and need for ongoing [place] discussions, I will ask if Palliative care can help continue conversations with family and patient

## 2020-08-12 NOTE — Assessment & Plan Note (Addendum)
-   etiology due to renal failure - s/p bicarb drip. Stopped on 6/24 - trend BMP

## 2020-08-12 NOTE — Consult Note (Signed)
Jeff Rodriguez Admit Date: 08/11/2020 08/12/2020 Jeff Rodriguez Requesting Physician:  Sabino Gasser MD  Reason for Consult:  AKI HPI:  89M presented to the ED yesterday evening with complaints of cough and weakness, some loose stools, poor oral intake.  Past history of multiple myeloma managed at the [facility] in [place].  Also a history of hypertension on lisinopril, likely BPH as patient takes tamsulosin and finasteride.  On evaluation the patient tested positive for COVID-19.  Also found to have AKI with a presenting creatinine of 7.3, worsened to 7.8.  Initial potassium of 2.6, corrected to 4.0.  He has a metabolic acidosis with anion gap of 18.  Lactate is normal.  Patient seen in the room.  He is verbal but a poor historian.  He denies use of NSAIDs.  It is unclear if he continue taking his lisinopril.  He denies any LUTS or difficulty voiding.  Care everywhere shows patient having a creatinine of 1.7 in January of this year.  Renal ultrasound on presentation normal-sized kidneys with increased echogenicity and no evidence of hydronephrosis.  Other labs are notable for a calcium of 6.7 with albumin of 2.6.  Total protein 6.3.  WBC 3.9, hemoglobin 7.6, platelets 77.  In addition to electrolyte repletion, patient has been hydrated with sodium bicarbonate.  Reported UOP is 0.4 L thus far.  ROS NSAIDS: Patient denies and no identified exposure IV Contrast patient denies and there are no identified exposure TMP/SMX no identified exposure Hypotension not present Balance of 12 systems is negative w/ exceptions as above  PMH  Past Medical History:  Diagnosis Date   Asthma    Diabetes mellitus    Hypertension    PSH  Past Surgical History:  Procedure Laterality Date   CHOLECYSTECTOMY N/A 04/23/2015   Procedure: LAPAROSCOPIC CHOLECYSTECTOMY WITH INTRAOPERATIVE CHOLANGIOGRAM;  Surgeon: Ralene Ok, MD;  Location: [facility];  Service: General;  Laterality: N/A;   REPLACEMENT TOTAL KNEE Bilateral     TOTAL HIP ARTHROPLASTY Right    FH  Family History  Problem Relation Age of Onset   Cancer - Lung Father    Deep vein thrombosis Mother    SH  reports that he does not drink alcohol and does not use drugs. No history on file for tobacco use. Allergies  Allergies  Allergen Reactions   Shrimp [Shellfish Allergy] Anaphylaxis   Iodine Swelling   Sulfa Antibiotics Other (See Comments)    other   Cephalexin Rash and Hives   Gabapentin Rash   Oxacillin Rash   Home medications Prior to Admission medications   Medication Sig Start Date End Date Taking? Authorizing Provider  acetaminophen (TYLENOL) 500 MG tablet Take 500 mg by mouth 4 (four) times daily. 08/12/09  Yes [provider]  albuterol (VENTOLIN HFA) 108 (90 Base) MCG/ACT inhaler Inhale 2 puffs into the lungs every 6 (six) hours as needed for wheezing or shortness of breath. 03/13/20  Yes [provider]  Calcium Carb-Cholecalciferol 600-400 MG-UNIT TABS Take 1 tablet by mouth daily. 06/04/20  Yes [provider]  carvedilol (COREG) 12.5 MG tablet Take 0.5 tablets by mouth daily. 06/16/20  Yes [provider]  dexamethasone (DECADRON) 4 MG tablet Take by mouth. 07/24/20  Yes [provider]  enoxaparin (LOVENOX) 40 MG/0.4ML injection Inject into the skin. 06/25/20  Yes [provider]  finasteride (PROSCAR) 5 MG tablet Take 1 tablet by mouth daily. 07/09/20  Yes [provider]  Insulin Glargine-yfgn 100 UNIT/ML SOPN Inject into the  skin. 07/28/20  Yes [provider]  lenalidomide ([name]) 15 MG capsule Take by mouth. 07/24/20  Yes [provider]  lidocaine (LIDODERM) 5 % Place onto the skin. 07/28/20  Yes [provider]  Menthol-Methyl Salicylate (THERA-GESIC) 0.5-15 % CREA Apply topically. 07/28/20  Yes [provider]  metFORMIN (GLUCOPHAGE) 1000 MG tablet Take 1 tablet by mouth 2 (two) times daily. 07/09/20  Yes [provider]   omeprazole (PRILOSEC) 40 MG capsule Take by mouth. 07/23/20  Yes [provider]  polyvinyl alcohol (LIQUIFILM TEARS) 1.4 % ophthalmic solution Apply to eye. 07/28/20  Yes [provider]  potassium chloride SA (KLOR-CON) 20 MEQ tablet Take by mouth. 07/28/20  Yes [provider]  rosuvastatin (CRESTOR) 40 MG tablet Take by mouth. 07/09/20  Yes [provider]  Sodium Chloride Flush (NORMAL SALINE FLUSH) 0.9 % SOLN Inject into the vein. 11/28/19  Yes [provider]  Zoledronic Acid 4 MG SOLR Inject 4 mg into the vein every 30 (thirty) days. 03/13/20  Yes [provider]  cyclobenzaprine (FLEXERIL) 5 MG tablet Take 5 mg by mouth 3 (three) times daily as needed for muscle spasms.    [provider]  diazepam (VALIUM) 5 MG tablet SMARTSIG:1 Tablet(s) By Mouth 05/07/20   [provider]  finasteride (PROSCAR) 5 MG tablet Take 5 mg by mouth daily.    [provider]  insulin glargine (LANTUS) 100 UNIT/ML injection Inject 0.2 mLs (20 Units total) into the skin at bedtime. 04/25/15   Delfina Redwood, MD  lisinopril (PRINIVIL,ZESTRIL) 20 MG tablet Take 10 mg by mouth daily.    [provider]  Oxycodone HCl 10 MG TABS Take 1 tablet (10 mg total) by mouth every 4 (four) hours as needed (for pain). 04/25/15   Kinsinger, Arta Bruce, MD  tamsulosin (FLOMAX) 0.4 MG CAPS capsule Take 0.8 mg by mouth at bedtime.     [provider]    Current Medications Scheduled Meds:  (feeding supplement) PROSource Plus  30 mL Oral BID BM   carvedilol  6.25 mg Oral Q breakfast   dexamethasone (DECADRON) injection  6 mg Intravenous Q24H   feeding supplement (NEPRO CARB STEADY)  237 mL Oral BID BM   finasteride  5 mg Oral Daily   heparin  5,000 Units Subcutaneous Q8H   insulin aspart  0-20 Units Subcutaneous TID WC   insulin aspart  0-5 Units Subcutaneous QHS   lenalidomide  15 mg Oral Daily   rosuvastatin  10 mg Oral Daily    tamsulosin  0.8 mg Oral QHS   Continuous Infusions:   sodium bicarbonate (isotonic) infusion in sterile water     PRN Meds:.acetaminophen, albuterol, chlorpheniramine-HYDROcodone, guaiFENesin-dextromethorphan, ondansetron **OR** ondansetron (ZOFRAN) IV  CBC Recent Labs  Lab 08/11/20 1627 08/12/20 0338  WBC 3.7* 3.9*  3.8*  NEUTROABS 2.6 3.1  HGB 7.7* 7.6*  7.6*  HCT 22.8* 23.1*  23.0*  MCV 97.0 99.1  99.1  PLT 86* 77*  77*   Basic Metabolic Panel Recent Labs  Lab 08/11/20 1627 08/12/20 0338  NA 129* 131*  K 2.6* 4.0  CL 97* 100  CO2 16* 13*  GLUCOSE 75 220*  BUN 67* 69*  CREATININE 7.27* 7.79*  7.64*  CALCIUM 6.8* 6.7*  PHOS  --  5.3*    Physical Exam  Blood pressure 126/68, pulse 91, temperature 97.7 F (36.5 C), temperature source Oral, resp. rate 20, height $RemoveBe'5\' 8"'jGAexRRTD$  (1.727 m), weight 63.1 kg, SpO2 100 %.  GEN: NAD, elderly, chronically ill-appearing ENT: NCAT EYES: EOMI CV: Regular, normal S1 and S2 PULM: Clear bilaterally ABD: Soft, nontender, no suprapubic fullness SKIN: No rashes or lesions EXT: No peripheral edema  Assessment 5F with renal failure of unclear time course, as mentioned above had a creatinine of 1.7 in January.  I do not have any records from the [facility].  Possible etiologies include hypovolemic/prerenal AKI in the setting of COVID-19 infection, poor oral intake, loose stools, compounded by ongoing lisinopril use.  Also of concern would be a myeloma based AKI and I do not have much information about the current status to go on.  He does have some mild pancytopenia, but no significant globulin gap, hypercalcemia, or noted osteolytic lesions on portable chest x-ray. Renal ultrasound reassuring, obstruction is not present.  Renal failure, suspect at least some component of AKI and some CKD; differential as above Multiple myeloma managed at the [facility] in [place], unclear status, by report receiving active therapy COVID-19 infection, asymptomatic,  per TRH Hypertension, chronic, on ACE inhibitor, held Hypokalemia at presentation, likely related to diarrhea and poor oral intake Loose stools/diarrhea, probably related to #3 Pancytopenia, hemoglobin 7.7 with MCV of 97, mild thrombocytopenia Increased anion gap metabolic acidosis, likely related to #1  Plan As above, will be more aggressive with volume replacement, bolus LR 1 L now, continue bicarbonate replacement Ordered SPEP and kappa/lambda free light chains to see status of myeloma Consider hematology involvement Daily weights, Daily Renal Panel, Strict I/Os, Avoid nephrotoxins (NSAIDs, judicious IV Contrast)  Will follow along closely No RRT needs at this point   Jeff Rodriguez  [phone] pgr 08/12/2020, 12:36 PM

## 2020-08-12 NOTE — Assessment & Plan Note (Addendum)
-   main symptoms are lethargy/malaise, poor oral intake. No respiratory sxms - CT value 22 - given hx MM and treatment for it, will go ahead and initiate remdesivir while inpatient - trend inflammatory markers - no indication for steroids at this time; has remained on RA

## 2020-08-12 NOTE — Assessment & Plan Note (Signed)
-   continue SSI and CBG monitoring  

## 2020-08-12 NOTE — Assessment & Plan Note (Signed)
continue current regimen.

## 2020-08-12 NOTE — Progress Notes (Signed)
Pharmacy Note   Lenalidomide ([name]) hold criteria ANC < 1 Pltc < 50K AST or ALT > 3x ULN Bili > 1.5x ULN Acute venous thromboembolic event Active infection - HOLD with +COVID   Royetta Asal, PharmD, BCPS 08/12/2020 1:49 PM

## 2020-08-12 NOTE — Progress Notes (Signed)
Initial Nutrition Assessment  DOCUMENTATION CODES:   Not applicable  INTERVENTION:  -Nepro Shake po BID, each supplement provides 425 kcal and 19 grams protein -PROSource PLUS PO 29mls BID, each supplement provides 100 kcals and 15 grams of protein  NUTRITION DIAGNOSIS:   Increased nutrient needs related to acute illness (COVID-19) as evidenced by estimated needs.  GOAL:   Patient will meet greater than or equal to 90% of their needs  MONITOR:   PO intake, Supplement acceptance, Labs, Weight trends, I & O's  REASON FOR ASSESSMENT:   Malnutrition Screening Tool    ASSESSMENT:   Pt with a PMH significant of multiple myeloma (currently undergoing chemo), DM, HTN, HLD, asthma, and OA admitted with hypokalemia, AKI as well as COVID-19 infection  Pt is a poor historian, but pt's son reports pt experienced 4-5 day history of productive cough/congestion, poor appetite/intake, weakness, and loose stools. Denies recent fever and vomiting. Also denies shortness of breath. Given poor po intake and increased nutrient needs 2/2 COVID infection, will provide pt with oral nutrition supplements in hopes of increasing kcal/protein intake.   PO intake: 2% x1 recorded meal   Medications: decadron, SSI Labs: Na 131 (L), Cr 7.79 (H, up from previous), PO4 5.3 (H) CBGs 109-232-263  NUTRITION - FOCUSED PHYSICAL EXAM: Unable to complete at this time as RD is working remotely from another campus. Will attempt at follow-up.   Diet Order:   Diet Order             Diet renal/carb modified with fluid restriction Diet-HS Snack? Nothing; Fluid restriction: 1200 mL Fluid; Room service appropriate? Yes; Fluid consistency: Thin  Diet effective now                   EDUCATION NEEDS:   No education needs have been identified at this time  Skin:  Skin Assessment: Reviewed RN Assessment  Last BM:  PTA  Height:   Ht Readings from Last 1 Encounters:  08/11/20 $RemoveB'5\' 8"'osvsrdyf$  (1.727 m)    Weight:    Wt Readings from Last 1 Encounters:  08/11/20 63.1 kg   BMI:  Body mass index is 21.15 kg/m.  Estimated Nutritional Needs:   Kcal:  1700-1900  Protein:  85-95g  Fluid:  >1.7L/d    Larkin Ina, MS, RD, LDN (she/her/hers) RD pager number and weekend/on-call pager number located in [place].

## 2020-08-12 NOTE — Assessment & Plan Note (Addendum)
-   presumed from chemo - trend CBC - Hgb down to 6 g/dL on 6/23. No obvious bleeding. BUN went up some too so will watch this as well - s/p 2 units PRBC, post H/H is 7.8 g/dL -Hold chemical DVT prophylaxis given further downtrend of platelets and some vomiting this morning with subtle coffee-ground appearance

## 2020-08-12 NOTE — Assessment & Plan Note (Signed)
-   treated - trend BMP

## 2020-08-13 DIAGNOSIS — N179 Acute kidney failure, unspecified: Secondary | ICD-10-CM | POA: Diagnosis not present

## 2020-08-13 DIAGNOSIS — U071 COVID-19: Secondary | ICD-10-CM | POA: Diagnosis not present

## 2020-08-13 LAB — CBC WITH DIFFERENTIAL/PLATELET
Abs Immature Granulocytes: 0.12 10*3/uL — ABNORMAL HIGH (ref 0.00–0.07)
Basophils Absolute: 0 10*3/uL (ref 0.0–0.1)
Basophils Relative: 0 %
Eosinophils Absolute: 0 10*3/uL (ref 0.0–0.5)
Eosinophils Relative: 1 %
HCT: 17.9 % — ABNORMAL LOW (ref 39.0–52.0)
Hemoglobin: 6 g/dL — CL (ref 13.0–17.0)
Immature Granulocytes: 5 %
Lymphocytes Relative: 15 %
Lymphs Abs: 0.3 10*3/uL — ABNORMAL LOW (ref 0.7–4.0)
MCH: 32.6 pg (ref 26.0–34.0)
MCHC: 33.5 g/dL (ref 30.0–36.0)
MCV: 97.3 fL (ref 80.0–100.0)
Monocytes Absolute: 0.3 10*3/uL (ref 0.1–1.0)
Monocytes Relative: 11 %
Neutro Abs: 1.5 10*3/uL — ABNORMAL LOW (ref 1.7–7.7)
Neutrophils Relative %: 68 %
Platelets: 60 10*3/uL — ABNORMAL LOW (ref 150–400)
RBC: 1.84 MIL/uL — ABNORMAL LOW (ref 4.22–5.81)
RDW: 16.9 % — ABNORMAL HIGH (ref 11.5–15.5)
WBC: 2.3 10*3/uL — ABNORMAL LOW (ref 4.0–10.5)
nRBC: 0 % (ref 0.0–0.2)

## 2020-08-13 LAB — KAPPA/LAMBDA LIGHT CHAINS
Kappa free light chain: 93.2 mg/L — ABNORMAL HIGH (ref 3.3–19.4)
Kappa, lambda light chain ratio: 2.45 — ABNORMAL HIGH (ref 0.26–1.65)
Lambda free light chains: 38.1 mg/L — ABNORMAL HIGH (ref 5.7–26.3)

## 2020-08-13 LAB — URINALYSIS, ROUTINE W REFLEX MICROSCOPIC
Bilirubin Urine: NEGATIVE
Glucose, UA: 50 mg/dL — AB
Ketones, ur: NEGATIVE mg/dL
Nitrite: NEGATIVE
Protein, ur: 30 mg/dL — AB
Specific Gravity, Urine: 1.009 (ref 1.005–1.030)
WBC, UA: >50 WBC/hpf — ABNORMAL HIGH (ref 0–5)
pH: 6 (ref 5.0–8.0)

## 2020-08-13 LAB — COMPREHENSIVE METABOLIC PANEL
ALT: 30 U/L (ref 0–44)
ALT: 29 U/L (ref 0–44)
AST: 42 U/L — ABNORMAL HIGH (ref 15–41)
AST: 41 U/L (ref 15–41)
Albumin: 2.3 g/dL — ABNORMAL LOW (ref 3.5–5.0)
Albumin: 2.3 g/dL — ABNORMAL LOW (ref 3.5–5.0)
Alkaline Phosphatase: 41 U/L (ref 38–126)
Alkaline Phosphatase: 41 U/L (ref 38–126)
Anion gap: 9 (ref 5–15)
Anion gap: 15 (ref 5–15)
BUN: 79 mg/dL — ABNORMAL HIGH (ref 8–23)
BUN: 80 mg/dL — ABNORMAL HIGH (ref 8–23)
CO2: 19 mmol/L — ABNORMAL LOW (ref 22–32)
CO2: 22 mmol/L (ref 22–32)
Calcium: 6.1 mg/dL — CL (ref 8.9–10.3)
Calcium: 6.3 mg/dL — CL (ref 8.9–10.3)
Chloride: 103 mmol/L (ref 98–111)
Chloride: 91 mmol/L — ABNORMAL LOW (ref 98–111)
Creatinine, Ser: 7.1 mg/dL — ABNORMAL HIGH (ref 0.61–1.24)
Creatinine, Ser: 7.35 mg/dL — ABNORMAL HIGH (ref 0.61–1.24)
GFR, Estimated: 8 mL/min — ABNORMAL LOW (ref >60)
GFR, Estimated: 7 mL/min — ABNORMAL LOW (ref >60)
Glucose, Bld: 139 mg/dL — ABNORMAL HIGH (ref 70–99)
Glucose, Bld: 116 mg/dL — ABNORMAL HIGH (ref 70–99)
Potassium: 3.5 mmol/L (ref 3.5–5.1)
Potassium: 3.5 mmol/L (ref 3.5–5.1)
Sodium: 131 mmol/L — ABNORMAL LOW (ref 135–145)
Sodium: 128 mmol/L — ABNORMAL LOW (ref 135–145)
Total Bilirubin: 0.4 mg/dL (ref 0.3–1.2)
Total Bilirubin: 0.6 mg/dL (ref 0.3–1.2)
Total Protein: 5.3 g/dL — ABNORMAL LOW (ref 6.5–8.1)
Total Protein: 5.5 g/dL — ABNORMAL LOW (ref 6.5–8.1)

## 2020-08-13 LAB — PROTEIN ELECTROPHORESIS, SERUM
A/G Ratio: 0.8 (ref 0.7–1.7)
Albumin ELP: 2.2 g/dL — ABNORMAL LOW (ref 2.9–4.4)
Alpha-1-Globulin: 0.4 g/dL (ref 0.0–0.4)
Alpha-2-Globulin: 1.1 g/dL — ABNORMAL HIGH (ref 0.4–1.0)
Beta Globulin: 0.8 g/dL (ref 0.7–1.3)
Gamma Globulin: 0.4 g/dL (ref 0.4–1.8)
Globulin, Total: 2.8 g/dL (ref 2.2–3.9)
Total Protein ELP: 5 g/dL — ABNORMAL LOW (ref 6.0–8.5)

## 2020-08-13 LAB — PHOSPHORUS: Phosphorus: 3 mg/dL (ref 2.5–4.6)

## 2020-08-13 LAB — HEMOGLOBIN AND HEMATOCRIT, BLOOD
HCT: 20.6 % — ABNORMAL LOW (ref 39.0–52.0)
HCT: 23 % — ABNORMAL LOW (ref 39.0–52.0)
Hemoglobin: 7 g/dL — ABNORMAL LOW (ref 13.0–17.0)
Hemoglobin: 7.9 g/dL — ABNORMAL LOW (ref 13.0–17.0)

## 2020-08-13 LAB — PREPARE RBC (CROSSMATCH)

## 2020-08-13 LAB — D-DIMER, QUANTITATIVE: D-Dimer, Quant: 2.71 ug/mL-FEU — ABNORMAL HIGH (ref 0.00–0.50)

## 2020-08-13 LAB — C-REACTIVE PROTEIN: CRP: 19.7 mg/dL — ABNORMAL HIGH (ref <1.0)

## 2020-08-13 LAB — MAGNESIUM: Magnesium: 1.6 mg/dL — ABNORMAL LOW (ref 1.7–2.4)

## 2020-08-13 LAB — GLUCOSE, CAPILLARY
Glucose-Capillary: 101 mg/dL — ABNORMAL HIGH (ref 70–99)
Glucose-Capillary: 124 mg/dL — ABNORMAL HIGH (ref 70–99)

## 2020-08-13 LAB — FERRITIN: Ferritin: 917 ng/mL — ABNORMAL HIGH (ref 24–336)

## 2020-08-13 MED ORDER — SODIUM CHLORIDE 0.9% IV SOLUTION: Freq: Once | INTRAVENOUS | Status: DC

## 2020-08-13 MED ORDER — CALCIUM GLUCONATE-NACL 2-0.675 GM/100ML-% IV SOLN
2.0000 g | Freq: Once | INTRAVENOUS | Status: AC
  Administered 2020-08-13: 2000 mg via INTRAVENOUS
  Filled 2020-08-13: qty 100

## 2020-08-13 MED ORDER — CALCIUM GLUCONATE-NACL 2-0.675 GM/100ML-% IV SOLN
2.0000 g | Freq: Once | INTRAVENOUS | Status: AC
  Administered 2020-08-13: 2000 mg via INTRAVENOUS
  Filled 2020-08-13 (×2): qty 100

## 2020-08-13 MED ORDER — SODIUM CHLORIDE 0.9% IV SOLUTION: Freq: Once | INTRAVENOUS | Status: AC

## 2020-08-13 MED ORDER — MAGNESIUM SULFATE 2 GM/50ML IV SOLN
2.0000 g | Freq: Once | INTRAVENOUS | Status: AC
  Administered 2020-08-13: 2 g via INTRAVENOUS
  Filled 2020-08-13: qty 50

## 2020-08-13 NOTE — Progress Notes (Signed)
MD made aware patient has a HGB of 7.0. MD ordered 1 unit of PRBC.

## 2020-08-13 NOTE — Progress Notes (Signed)
PT Cancellation Note  Patient Details Name: Jeff Rodriguez MRN: [id number] DOB: 06-24-1945   Cancelled Treatment:    Reason Eval/Treat Not Completed: Patient not medically ready. Per chart review, pt with lab values outside of therapeutic range, receiving blood. Will check back for PT eval as schedule permits.   Tori Ngozi Alvidrez PT, DPT 08/13/20, 9:12 AM

## 2020-08-13 NOTE — Progress Notes (Signed)
OT Cancellation Note  Patient Details Name: Jeff Rodriguez MRN: [id number] DOB: May 06, 1945   Cancelled Treatment:    Reason Eval/Treat Not Completed: Medical issues which prohibited therapy. Critical level lab values. Patient receiving blood. Will continue to follow patient.  Lenward Chancellor 08/13/2020, 2:37 PM

## 2020-08-13 NOTE — Progress Notes (Signed)
End of Shift: Patient in bed with unit of PRBC infusing. Patient comfortable and showing no signs of distress. Call bell in reach. Gave bedside report to [place], Therapist, sports.

## 2020-08-13 NOTE — Progress Notes (Signed)
Subjective:  At least 400 of UOP recorded, and another 500 in bag - crt does seem to be down-  BUN up a little - unit of blood given today for hgb of 6-  also some hypocalcemia -  he is more alert it seems-  no specific complaints   Objective Vital signs in last 24 hours: Vitals:   08/13/20 0533 08/13/20 0610 08/13/20 0900 08/13/20 1124  BP: (!) 94/56 (!) 99/53 (!) 98/55 (!) 99/56  Pulse: 83 79 73 77  Resp:  $Remo'16 16 16  'tRCZi$ Temp: 98.1 F (36.7 C) 98.5 F (36.9 C) 98.4 F (36.9 C) 98.1 F (36.7 C)  TempSrc: Oral Oral Oral Oral  SpO2: 98% 99% 96%   Weight:      Height:       Weight change:   Intake/Output Summary (Last 24 hours) at 08/13/2020 1607 Last data filed at 08/13/2020 1120 Gross per 24 hour  Intake 742.25 ml  Output 0 ml  Net 742.25 ml    Assessment/ Plan: Pt is a 75 y.o. yo male with multiple myeloma who was admitted on 08/11/2020 with COVID-  A on CRT-  crt 1.7 in Jan 2022-  now 7- non oliguric  Assessment/Plan: 1. A on CRF-  possibly taking ACE-I prior to admit-  renal u/s neg for obstruction- no urine for review-  seems like it is improving-  UOP improving.  Could be ATN from hypotension on ACE.    myeloma kidney is also in the differential-  dont have details about his status with that-  just that he is on revlimid.  No dialysis indications at this time, will continue to follow  2. HTN/volume-  not wet-  getting sodium bicarb based fluids at 125 per hour-  to continue  3. Anemia-  for blood transfusion today -  due to myeloma and also AKI 4. Hypocalcemia-  corrected is around 7.7-  no sxms - no action needed  5. Hyponatremia-  correcting slowly  6. Hypokalemia-  replete PRN-  getting blood today so did not give any     Louis Meckel    Labs: Basic Metabolic Panel: Recent Labs  Lab 08/11/20 1627 08/12/20 0338 08/13/20 0400  NA 129* 131* 131*  K 2.6* 4.0 3.5  CL 97* 100 103  CO2 16* 13* 19*  GLUCOSE 75 220* 139*  BUN 67* 69* 79*  CREATININE 7.27*  7.79*  7.64* 7.10*  CALCIUM 6.8* 6.7* 6.1*  PHOS  --  5.3* 3.0   Liver Function Tests: Recent Labs  Lab 08/11/20 1627 08/12/20 0338 08/13/20 0400  AST 40 38 42*  ALT $Re'31 30 30  'RTz$ ALKPHOS 44 41 41  BILITOT 0.7 0.8 0.4  PROT 6.3* 6.3* 5.3*  ALBUMIN 2.7* 2.6* 2.3*   No results for input(s): LIPASE, AMYLASE in the last 168 hours. No results for input(s): AMMONIA in the last 168 hours. CBC: Recent Labs  Lab 08/11/20 1627 08/12/20 0338 08/13/20 0400 08/13/20 1207  WBC 3.7* 3.9*  3.8* 2.3*  --   NEUTROABS 2.6 3.1 1.5*  --   HGB 7.7* 7.6*  7.6* 6.0* 7.0*  HCT 22.8* 23.1*  23.0* 17.9* 20.6*  MCV 97.0 99.1  99.1 97.3  --   PLT 86* 77*  77* 60*  --    Cardiac Enzymes: No results for input(s): CKTOTAL, CKMB, CKMBINDEX, TROPONINI in the last 168 hours. CBG: Recent Labs  Lab 08/12/20 0814 08/12/20 1229 08/12/20 1611 08/12/20 2134 08/13/20 1121  GLUCAP 232* 263*  186* 155* 101*    Iron Studies:  Recent Labs    08/13/20 0400  FERRITIN 917*   Studies/Results: US RENAL  Result Date: 08/12/2020 CLINICAL DATA:  Acute renal failure. EXAM: RENAL / URINARY TRACT ULTRASOUND COMPLETE COMPARISON:  CT 06/04/2010. FINDINGS: Right Kidney: Renal measurements: 11.6 x 5.5 x 5.9 cm = volume: 197.5 mL. Increased echogenicity. No mass or hydronephrosis visualized. Left Kidney: Renal measurements: 11.5 x 6.4 x 5.3 cm = volume: 204.9 mL. Increased echogenicity. No mass or hydronephrosis visualized. Bladder: Appears normal for degree of bladder distention. Other: The prostate is enlarged at 6.1 x 3.9 x 5.3 cm. The prostate is irregular in contour. IMPRESSION: 1. Increased echogenicity both kidneys consistent chronic medical renal disease. No acute abnormality identified. No hydronephrosis or bladder distention. 2.  The prostate is enlarged and irregular in contour. Electronically Signed   By: Marcello Moores  Register   On: 08/12/2020 07:15   DG Chest Port 1 View  Result Date: 08/11/2020 CLINICAL DATA:   Cough. EXAM: PORTABLE CHEST 1 VIEW COMPARISON:  04/20/2015 FINDINGS: The lungs are clear without focal pneumonia, edema, pneumothorax or pleural effusion. The cardiopericardial silhouette is within normal limits for size. Degenerative changes noted in the right shoulder. Telemetry leads overlie the chest. IMPRESSION: No active disease. Electronically Signed   By: Misty Stanley M.D.   On: 08/11/2020 17:08   Medications: Infusions:  remdesivir 100 mg in NS 100 mL 100 mg (08/13/20 1123)    sodium bicarbonate (isotonic) infusion in sterile water 125 mL/hr at 08/13/20 1535    Scheduled Medications:  (feeding supplement) PROSource Plus  30 mL Oral BID BM   sodium chloride   Intravenous Once   sodium chloride   Intravenous Once   feeding supplement (NEPRO CARB STEADY)  237 mL Oral BID BM   finasteride  5 mg Oral Daily   heparin  5,000 Units Subcutaneous Q8H   insulin aspart  0-20 Units Subcutaneous TID WC   insulin aspart  0-5 Units Subcutaneous QHS   lidocaine  1 patch Transdermal Q24H   tamsulosin  0.8 mg Oral QHS    have reviewed scheduled and prn medications.  Physical Exam: General: eyes closed but responds to questions-  denies pain-  says he feels better than when he came in Heart: RRR Lungs: mostly clear Abdomen: soft, non tender Extremities: no edema    08/13/2020,4:07 PM  LOS: 2 days

## 2020-08-13 NOTE — Progress Notes (Signed)
Progress Note    GILMAN OLAZABAL   BRA:[id number]  DOB: 1945-10-31  DOA: 08/11/2020     2  PCP: [facility]  CC: cough, SOB  Hospital Course: Jeff Rodriguez is a 75 y.o. male with medical history significant of multiple myeloma (on [name]), diabetes, hypertension, asthma, hyperlipidemia and osteoarthritis who was brought in by EMS secondary to productive cough, poor oral intake and not eating much.  Patient is currently undergoing chemo for his multiple myeloma.  Patient was a poor historian on admission.  He lives alone with 1 son living locally and another son that calls to check on him consistently.  He is a patient of the [facility] and managed there in regards to his multiple myeloma.  He underwent work-up in the ER and tested positive for COVID-19.  He was also found to be hyponatremic, hypokalemic, and significant renal failure.  He was started on fluids and admitted for further work-up.  Interval History:  Very mild improvement in creatinine from yesterday but slight increase in BUN.  Called and discussed findings with his son as well. Hemoglobin had also dropped to 6 g/dL this morning, no obvious bleeding.  ROS: Constitutional: negative for fevers, Respiratory: negative for cough and sputum, Cardiovascular: negative for chest pain, and Gastrointestinal: negative for abdominal pain  Assessment & Plan: * AKI (acute kidney injury) ([place]) - No recent labs to review but renal function in January 2022 was creatinine 1.74, BUN 27, GFR 44 -Presents with creatinine 7.27, BUN 67 -Wide differential for etiology but possibly/hopefully due to prolonged prerenal from poor oral intake which may have led to an ATN; otherwise could also be related to underlying multiple myeloma or COVID-related - ACEi on hold; renal u/s negative for obstruction - appreciate nephrology involvement as well given his large change in renal fxn - follow up further pending labs  Increased  anion gap metabolic acidosis - etiology due to renal failure - change IVF to bicarb drip on 6/22 - trend BMP  Pancytopenia ([place]) - presumed from chemo - trend CBC - Hgb down to 6 g/dL on 6/23. No obvious bleeding. BUN went up some too so will watch this as well - treated with total of 2 units PRBC on 6/23. Follow up repeat H/H after 2nd unit  COVID-19 virus infection - main symptoms are lethargy/malaise, poor oral intake. No respiratory sxms - CT value 22 - given hx MM and treatment for it, will go ahead and initiate remdesivir while inpatient - trend inflammatory markers - no indication for steroids at this time  Multiple myeloma (H[facility]) - on [name] - follows at [facility] - currently [name] on hold per pharmacy - if renal function does not improve with IVF, may need to involve heme/onc for further workup  Hypokalemia - treated - trend BMP  Essential hypertension - continue current regimen  Diabetes mellitus with complication ([place]) - continue SSI and CBG monitoring   Hyperlipidemia - hold statin for now   Old records reviewed in assessment of this patient  Antimicrobials: Remdesivir 6/22 >> current  DVT prophylaxis: heparin injection 5,000 Units Start: 08/11/20 2315   Code Status:   Code Status: Full Code Family Communication: son  Disposition Plan: Status is: Inpatient  Remains inpatient appropriate because:IV treatments appropriate due to intensity of illness or inability to take PO and Inpatient level of care appropriate due to severity of illness  Dispo: The patient is from: Home  Anticipated d/c is to:  pending PT eval              Patient currently is not medically stable to d/c.   Difficult to place patient No  Risk of unplanned readmission score: Unplanned Admission- Pilot do not use: 27.11   Objective: Blood pressure (!) 99/56, pulse 77, temperature 98.1 F (36.7 C), temperature source Oral, resp. rate 16, height $RemoveBe'5\' 8"'hWvOQscEu$  (1.727 m),  weight 63.1 kg, SpO2 96 %.  Examination: General appearance:  Elderly gentleman in no distress but appears very fatigued Head: Normocephalic, without obvious abnormality, atraumatic Eyes:  EOMI Lungs: clear to auscultation bilaterally Heart: regular rate and rhythm and S1, S2 normal Abdomen: normal findings: bowel sounds normal and soft, non-tender Extremities:  No edema Skin:  Diffuse dry skin Neurologic: Grossly normal  Consultants:  Nephrology  Procedures:    Data Reviewed: I have personally reviewed following labs and imaging studies Results for orders placed or performed during the hospital encounter of 08/11/20 (from the past 24 hour(s))  Glucose, capillary     Status: Abnormal   Collection Time: 08/12/20  4:11 PM  Result Value Ref Range   Glucose-Capillary 186 (H) 70 - 99 mg/dL  Glucose, capillary     Status: Abnormal   Collection Time: 08/12/20  9:34 PM  Result Value Ref Range   Glucose-Capillary 155 (H) 70 - 99 mg/dL  CBC with Differential/Platelet     Status: Abnormal   Collection Time: 08/13/20  4:00 AM  Result Value Ref Range   WBC 2.3 (L) 4.0 - 10.5 K/uL   RBC 1.84 (L) 4.22 - 5.81 MIL/uL   Hemoglobin 6.0 (LL) 13.0 - 17.0 g/dL   HCT 17.9 (L) 39.0 - 52.0 %   MCV 97.3 80.0 - 100.0 fL   MCH 32.6 26.0 - 34.0 pg   MCHC 33.5 30.0 - 36.0 g/dL   RDW 16.9 (H) 11.5 - 15.5 %   Platelets 60 (L) 150 - 400 K/uL   nRBC 0.0 0.0 - 0.2 %   Neutrophils Relative % 68 %   Neutro Abs 1.5 (L) 1.7 - 7.7 K/uL   Lymphocytes Relative 15 %   Lymphs Abs 0.3 (L) 0.7 - 4.0 K/uL   Monocytes Relative 11 %   Monocytes Absolute 0.3 0.1 - 1.0 K/uL   Eosinophils Relative 1 %   Eosinophils Absolute 0.0 0.0 - 0.5 K/uL   Basophils Relative 0 %   Basophils Absolute 0.0 0.0 - 0.1 K/uL   Immature Granulocytes 5 %   Abs Immature Granulocytes 0.12 (H) 0.00 - 0.07 K/uL   Burr Cells PRESENT   Comprehensive metabolic panel     Status: Abnormal   Collection Time: 08/13/20  4:00 AM  Result Value  Ref Range   Sodium 131 (L) 135 - 145 mmol/L   Potassium 3.5 3.5 - 5.1 mmol/L   Chloride 103 98 - 111 mmol/L   CO2 19 (L) 22 - 32 mmol/L   Glucose, Bld 139 (H) 70 - 99 mg/dL   BUN 79 (H) 8 - 23 mg/dL   Creatinine, Ser 7.10 (H) 0.61 - 1.24 mg/dL   Calcium 6.1 (LL) 8.9 - 10.3 mg/dL   Total Protein 5.3 (L) 6.5 - 8.1 g/dL   Albumin 2.3 (L) 3.5 - 5.0 g/dL   AST 42 (H) 15 - 41 U/L   ALT 30 0 - 44 U/L   Alkaline Phosphatase 41 38 - 126 U/L   Total Bilirubin 0.4 0.3 - 1.2 mg/dL  GFR, Estimated 8 (L) >60 mL/min   Anion gap 9 5 - 15  C-reactive protein     Status: Abnormal   Collection Time: 08/13/20  4:00 AM  Result Value Ref Range   CRP 19.7 (H) <1.0 mg/dL  D-dimer, quantitative     Status: Abnormal   Collection Time: 08/13/20  4:00 AM  Result Value Ref Range   D-Dimer, Quant 2.71 (H) 0.00 - 0.50 ug/mL-FEU  Ferritin     Status: Abnormal   Collection Time: 08/13/20  4:00 AM  Result Value Ref Range   Ferritin 917 (H) 24 - 336 ng/mL  Magnesium     Status: Abnormal   Collection Time: 08/13/20  4:00 AM  Result Value Ref Range   Magnesium 1.6 (L) 1.7 - 2.4 mg/dL  Phosphorus     Status: None   Collection Time: 08/13/20  4:00 AM  Result Value Ref Range   Phosphorus 3.0 2.5 - 4.6 mg/dL  Prepare RBC (crossmatch)     Status: None   Collection Time: 08/13/20  5:10 AM  Result Value Ref Range   Order Confirmation      ORDER PROCESSED BY BLOOD BANK Performed at [facility], [place] [address] [place], [place] [address]   Glucose, capillary     Status: Abnormal   Collection Time: 08/13/20 11:21 AM  Result Value Ref Range   Glucose-Capillary 101 (H) 70 - 99 mg/dL  Hemoglobin and hematocrit, blood     Status: Abnormal   Collection Time: 08/13/20 12:07 PM  Result Value Ref Range   Hemoglobin 7.0 (L) 13.0 - 17.0 g/dL   HCT 20.6 (L) 39.0 - 52.0 %  Prepare RBC (crossmatch)     Status: None   Collection Time: 08/13/20  1:59 PM  Result Value Ref Range   Order Confirmation       ORDER PROCESSED BY BLOOD BANK Performed at [facility] [address] [place], [place] [address]     Recent Results (from the past 240 hour(s))  Resp Panel by RT-PCR (Flu A&B, Covid) Nasopharyngeal Swab     Status: Abnormal   Collection Time: 08/11/20  3:50 PM   Specimen: Nasopharyngeal Swab; Nasopharyngeal(NP) swabs in vial transport medium  Result Value Ref Range Status   SARS Coronavirus 2 by RT PCR POSITIVE (A) NEGATIVE Final    Comment: RESULT CALLED TO, READ BACK BY AND VERIFIED WITH: M.BELFI AT 1949 ON 06.21.22 BY N.THOMPSON (NOTE) SARS-CoV-2 target nucleic acids are DETECTED.  The SARS-CoV-2 RNA is generally detectable in upper respiratory specimens during the acute phase of infection. Positive results are indicative of the presence of the identified virus, but do not rule out bacterial infection or co-infection with other pathogens not detected by the test. Clinical correlation with patient history and other diagnostic information is necessary to determine patient infection status. The expected result is Negative.  Fact Sheet for Patients: EntrepreneurPulse.com.au  Fact Sheet for Healthcare Providers: IncredibleEmployment.be  This test is not yet approved or cleared by the [place] FDA and  has been authorized for detection and/or diagnosis of SARS-CoV-2 by FDA under an Emergency Use Authorization (EUA).  This EUA will remain in effect (meaning this tes t can be used) for the duration of  the COVID-19 declaration under Section 564(b)(1) of the Act, 21 U.S.C. section 360bbb-3(b)(1), unless the authorization is terminated or revoked sooner.     Influenza A by PCR NEGATIVE NEGATIVE Final   Influenza B by PCR NEGATIVE NEGATIVE Final  Comment: (NOTE) The Xpert Xpress SARS-CoV-2/FLU/RSV plus assay is intended as an aid in the diagnosis of influenza from Nasopharyngeal swab specimens and should not be  used as a sole basis for treatment. Nasal washings and aspirates are unacceptable for Xpert Xpress SARS-CoV-2/FLU/RSV testing.  Fact Sheet for Patients: EntrepreneurPulse.com.au  Fact Sheet for Healthcare Providers: IncredibleEmployment.be  This test is not yet approved or cleared by the [place] FDA and has been authorized for detection and/or diagnosis of SARS-CoV-2 by FDA under an Emergency Use Authorization (EUA). This EUA will remain in effect (meaning this test can be used) for the duration of the COVID-19 declaration under Section 564(b)(1) of the Act, 21 U.S.C. section 360bbb-3(b)(1), unless the authorization is terminated or revoked.  Performed at [facility], [place] [address] [place], [place] [address]   Blood Culture (routine x 2)     Status: None (Preliminary result)   Collection Time: 08/11/20  3:50 PM   Specimen: BLOOD  Result Value Ref Range Status   Specimen Description   Final    BLOOD BLOOD RIGHT FOREARM Performed at [facility] [address], [place], [place] [address]    Special Requests   Final    BOTTLES DRAWN AEROBIC AND ANAEROBIC Blood Culture adequate volume Performed at [facility] [address], [place], [place] [address]    Culture   Final    NO GROWTH 2 DAYS Performed at [facility] [address], [place], [place] [address]    Report Status PENDING  Incomplete  Blood Culture (routine x 2)     Status: None (Preliminary result)   Collection Time: 08/11/20  3:55 PM   Specimen: BLOOD  Result Value Ref Range Status   Specimen Description   Final    BLOOD BLOOD LEFT FOREARM Performed at [facility] [address] [place], [place] [address]    Special Requests   Final    BOTTLES DRAWN AEROBIC AND ANAEROBIC Blood Culture adequate volume Performed at [facility] [address] [place],  [place] [address]    Culture   Final    NO GROWTH 2 DAYS Performed at [facility] [address], [place], [place] [address]    Report Status PENDING  Incomplete     Radiology Studies: US RENAL  Result Date: 08/12/2020 CLINICAL DATA:  Acute renal failure. EXAM: RENAL / URINARY TRACT ULTRASOUND COMPLETE COMPARISON:  CT 06/04/2010. FINDINGS: Right Kidney: Renal measurements: 11.6 x 5.5 x 5.9 cm = volume: 197.5 mL. Increased echogenicity. No mass or hydronephrosis visualized. Left Kidney: Renal measurements: 11.5 x 6.4 x 5.3 cm = volume: 204.9 mL. Increased echogenicity. No mass or hydronephrosis visualized. Bladder: Appears normal for degree of bladder distention. Other: The prostate is enlarged at 6.1 x 3.9 x 5.3 cm. The prostate is irregular in contour. IMPRESSION: 1. Increased echogenicity both kidneys consistent chronic medical renal disease. No acute abnormality identified. No hydronephrosis or bladder distention. 2.  The prostate is enlarged and irregular in contour. Electronically Signed   By: Marcello Moores  Register   On: 08/12/2020 07:15   DG Chest Port 1 View  Result Date: 08/11/2020 CLINICAL DATA:  Cough. EXAM: PORTABLE CHEST 1 VIEW COMPARISON:  04/20/2015 FINDINGS: The lungs are clear without focal pneumonia, edema, pneumothorax or pleural effusion. The cardiopericardial silhouette is within normal limits for size. Degenerative changes noted in the right shoulder. Telemetry leads overlie the chest. IMPRESSION: No active disease. Electronically Signed  By: Misty Stanley M.D.   On: 08/11/2020 17:08   US RENAL  Final Result    DG Chest Port 1 View  Final Result      Scheduled Meds:  (feeding supplement) PROSource Plus  30 mL Oral BID BM   sodium chloride   Intravenous Once   sodium chloride   Intravenous Once   feeding supplement (NEPRO CARB STEADY)  237 mL Oral BID BM   finasteride  5 mg Oral Daily   heparin  5,000 Units Subcutaneous Q8H   insulin aspart  0-20 Units Subcutaneous TID  WC   insulin aspart  0-5 Units Subcutaneous QHS   lidocaine  1 patch Transdermal Q24H   tamsulosin  0.8 mg Oral QHS   PRN Meds: acetaminophen, albuterol, chlorpheniramine-HYDROcodone, guaiFENesin-dextromethorphan, ondansetron **OR** ondansetron (ZOFRAN) IV Continuous Infusions:  remdesivir 100 mg in NS 100 mL 100 mg (08/13/20 1123)    sodium bicarbonate (isotonic) infusion in sterile water 125 mL/hr at 08/12/20 1551     LOS: 2 days  Time spent: Greater than 50% of the 35 minute visit was spent in counseling/coordination of care for the patient as laid out in the A&P.   Dwyane Dee, MD [facility] 08/13/2020, 3:22 PM

## 2020-08-13 NOTE — Progress Notes (Signed)
MD was in patients room. RN notified Renal MD patient has a critical Calcium at 6.3. MD made aware while in the room.

## 2020-08-14 ENCOUNTER — Encounter (HOSPITAL_COMMUNITY): Payer: Self-pay | Admitting: Internal Medicine

## 2020-08-14 DIAGNOSIS — U071 COVID-19: Secondary | ICD-10-CM | POA: Diagnosis not present

## 2020-08-14 DIAGNOSIS — N179 Acute kidney failure, unspecified: Secondary | ICD-10-CM

## 2020-08-14 DIAGNOSIS — C9000 Multiple myeloma not having achieved remission: Secondary | ICD-10-CM | POA: Diagnosis not present

## 2020-08-14 DIAGNOSIS — E872 Acidosis: Secondary | ICD-10-CM | POA: Diagnosis not present

## 2020-08-14 LAB — MAGNESIUM: Magnesium: 1.8 mg/dL (ref 1.7–2.4)

## 2020-08-14 LAB — CBC WITH DIFFERENTIAL/PLATELET
Abs Immature Granulocytes: 0.15 10*3/uL — ABNORMAL HIGH (ref 0.00–0.07)
Basophils Absolute: 0 10*3/uL (ref 0.0–0.1)
Basophils Relative: 0 %
Eosinophils Absolute: 0.1 10*3/uL (ref 0.0–0.5)
Eosinophils Relative: 2 %
HCT: 22.6 % — ABNORMAL LOW (ref 39.0–52.0)
Hemoglobin: 7.8 g/dL — ABNORMAL LOW (ref 13.0–17.0)
Immature Granulocytes: 5 %
Lymphocytes Relative: 24 %
Lymphs Abs: 0.7 10*3/uL (ref 0.7–4.0)
MCH: 31.3 pg (ref 26.0–34.0)
MCHC: 34.5 g/dL (ref 30.0–36.0)
MCV: 90.8 fL (ref 80.0–100.0)
Monocytes Absolute: 0.3 10*3/uL (ref 0.1–1.0)
Monocytes Relative: 12 %
Neutro Abs: 1.6 10*3/uL — ABNORMAL LOW (ref 1.7–7.7)
Neutrophils Relative %: 57 %
Platelets: 43 10*3/uL — ABNORMAL LOW (ref 150–400)
RBC: 2.49 MIL/uL — ABNORMAL LOW (ref 4.22–5.81)
RDW: 17.7 % — ABNORMAL HIGH (ref 11.5–15.5)
WBC: 2.8 10*3/uL — ABNORMAL LOW (ref 4.0–10.5)
nRBC: 0 % (ref 0.0–0.2)

## 2020-08-14 LAB — BPAM RBC
Blood Product Expiration Date: 202207142359
Blood Product Expiration Date: 202207162359
ISSUE DATE / TIME: 202206230541
ISSUE DATE / TIME: 202206231644
Unit Type and Rh: 9500
Unit Type and Rh: 9500

## 2020-08-14 LAB — COMPREHENSIVE METABOLIC PANEL
ALT: 29 U/L (ref 0–44)
AST: 37 U/L (ref 15–41)
Albumin: 2.2 g/dL — ABNORMAL LOW (ref 3.5–5.0)
Alkaline Phosphatase: 40 U/L (ref 38–126)
Anion gap: 16 — ABNORMAL HIGH (ref 5–15)
BUN: 83 mg/dL — ABNORMAL HIGH (ref 8–23)
CO2: 23 mmol/L (ref 22–32)
Calcium: 6.5 mg/dL — ABNORMAL LOW (ref 8.9–10.3)
Chloride: 93 mmol/L — ABNORMAL LOW (ref 98–111)
Creatinine, Ser: 7.23 mg/dL — ABNORMAL HIGH (ref 0.61–1.24)
GFR, Estimated: 7 mL/min — ABNORMAL LOW (ref >60)
Glucose, Bld: 161 mg/dL — ABNORMAL HIGH (ref 70–99)
Potassium: 3.5 mmol/L (ref 3.5–5.1)
Sodium: 132 mmol/L — ABNORMAL LOW (ref 135–145)
Total Bilirubin: 0.5 mg/dL (ref 0.3–1.2)
Total Protein: 5 g/dL — ABNORMAL LOW (ref 6.5–8.1)

## 2020-08-14 LAB — FERRITIN: Ferritin: 892 ng/mL — ABNORMAL HIGH (ref 24–336)

## 2020-08-14 LAB — TYPE AND SCREEN
ABO/RH(D): O NEG
Antibody Screen: NEGATIVE
Unit division: 0
Unit division: 0

## 2020-08-14 LAB — C-REACTIVE PROTEIN: CRP: 17.6 mg/dL — ABNORMAL HIGH (ref <1.0)

## 2020-08-14 LAB — GLUCOSE, CAPILLARY
Glucose-Capillary: 113 mg/dL — ABNORMAL HIGH (ref 70–99)
Glucose-Capillary: 190 mg/dL — ABNORMAL HIGH (ref 70–99)
Glucose-Capillary: 90 mg/dL (ref 70–99)
Glucose-Capillary: 162 mg/dL — ABNORMAL HIGH (ref 70–99)

## 2020-08-14 LAB — PHOSPHORUS: Phosphorus: 4.6 mg/dL (ref 2.5–4.6)

## 2020-08-14 LAB — D-DIMER, QUANTITATIVE: D-Dimer, Quant: 3.68 ug/mL-FEU — ABNORMAL HIGH (ref 0.00–0.50)

## 2020-08-14 MED ORDER — LACTATED RINGERS IV SOLN: INTRAVENOUS | Status: DC

## 2020-08-14 NOTE — Evaluation (Signed)
Physical Therapy Evaluation Patient Details Name: Jeff Rodriguez MRN: [id number] DOB: 02-01-1946 Today's Date: 08/14/2020   History of Present Illness  patient is a 75 year old male who was admitted to the hospital on 6/22 with COVID 19 and AKI. past medical history significant for multiple myeloma, diabetes mellitus, hypertension, asthma, hyperlipidemia, osteoarthritis.  Clinical Impression  Pt admitted with above diagnosis.  Pt difficult to awaken--very sleepy, pt is significantly weak and deconditioned. Reports independence at baseline. Today requiring  ~ mod assist for bed mobility and transfers. Recommend SNF post acute. Will follow in acute setting.   Pt currently with functional limitations due to the deficits listed below (see PT Problem List). Pt will benefit from skilled PT to increase their independence and safety with mobility to allow discharge to the venue listed below.       Follow Up Recommendations SNF    Equipment Recommendations  Rolling walker with 5" wheels    Recommendations for Other Services       Precautions / Restrictions Precautions Precautions: Fall Restrictions Weight Bearing Restrictions: No      Mobility  Bed Mobility Overal bed mobility: Needs Assistance Bed Mobility: Supine to Sit;Sit to Supine     Supine to sit: Mod assist;HOB elevated Sit to supine: Mod assist   General bed mobility comments: HOB elevated, assistance for leg movement, trunk and scooting forwards, incr time    Transfers Overall transfer level: Needs assistance Equipment used: Rolling walker (2 wheeled) Transfers: Sit to/from Stand Sit to Stand: Mod assist;Min assist;From elevated surface         General transfer comment: cues for hand placement, to power up with LEs, assist to rise extend hips/trunk  Ambulation/Gait Ambulation/Gait assistance: Min assist Gait Distance (Feet): 3 Feet Assistive device: Rolling walker (2 wheeled)       General Gait Details:  lateral steps along EOB  Stairs            Wheelchair Mobility    Modified Rankin (Stroke Patients Only)       Balance Overall balance assessment: Needs assistance Sitting-balance support: No upper extremity supported Sitting balance-Leahy Scale: Fair     Standing balance support: Bilateral upper extremity supported Standing balance-Leahy Scale: Poor Standing balance comment: reliant on UEs and external support                             Pertinent Vitals/Pain Pain Assessment: No/denies pain    Home Living Family/patient expects to be discharged to:: Skilled nursing facility Living Arrangements: Alone   Type of Home: House Home Access: Ramped entrance     Home Layout: One level Home Equipment: Walker - 4 wheels      Prior Function Level of Independence: Independent with assistive device(s)         Comments: son assist with groceries     Hand Dominance        Extremity/Trunk Assessment   Upper Extremity Assessment Upper Extremity Assessment: Generalized weakness;Defer to OT evaluation    Lower Extremity Assessment Lower Extremity Assessment: Generalized weakness       Communication   Communication: No difficulties  Cognition Arousal/Alertness: Lethargic Behavior During Therapy: WFL for tasks assessed/performed Overall Cognitive Status: Within Functional Limits for tasks assessed  General Comments      Exercises     Assessment/Plan    PT Assessment Patient needs continued PT services  PT Problem List Decreased strength;Decreased mobility;Decreased activity tolerance;Decreased balance;Decreased knowledge of use of DME       PT Treatment Interventions DME instruction;Therapeutic exercise;Gait training;Functional mobility training;Therapeutic activities;Patient/family education;Balance training    PT Goals (Current goals can be found in the Care Plan section)  Acute  Rehab PT Goals Patient Stated Goal: to get stronger PT Goal Formulation: With patient Time For Goal Achievement: 08/28/20 Potential to Achieve Goals: Good    Frequency Min 3X/week   Barriers to discharge        Co-evaluation               AM-PAC PT "6 Clicks" Mobility  Outcome Measure Help needed turning from your back to your side while in a flat bed without using bedrails?: A Lot Help needed moving from lying on your back to sitting on the side of a flat bed without using bedrails?: A Lot Help needed moving to and from a bed to a chair (including a wheelchair)?: A Lot Help needed standing up from a chair using your arms (e.g., wheelchair or bedside chair)?: A Lot Help needed to walk in hospital room?: A Lot Help needed climbing 3-5 steps with a railing? : Total 6 Click Score: 11    End of Session Equipment Utilized During Treatment: Gait belt Activity Tolerance: Patient tolerated treatment well Patient left: with call bell/phone within reach;in bed;with bed alarm set Nurse Communication: Mobility status PT Visit Diagnosis: Other abnormalities of gait and mobility (R26.89)    Time: 2481-8590 PT Time Calculation (min) (ACUTE ONLY): 18 min   Charges:   PT Evaluation $PT Eval Low Complexity: [place], PT  Acute Rehab Dept ([place]) [phone] Pager [phone]  08/14/2020   [facility] 08/14/2020, 5:09 PM

## 2020-08-14 NOTE — Evaluation (Signed)
Occupational Therapy Evaluation Patient Details Name: Jeff Rodriguez MRN: [id number] DOB: [birth year] Today's Date: 08/14/2020    History of Present Illness patient is a 75 year old male who was admitted to the hospital on 6/22 with COVID 19 and AKI. past medical history significant for multiple myeloma, diabetes mellitus, hypertension, asthma, hyperlipidemia, osteoarthritis.   Clinical Impression   Patient is a 75 year old male who presents with above history. Patient at time of evaluation presents with decreased strength, decreased activity tolerance, decreased ability to complete ADLs and functional mobility. Patient reported living at home alone with son providing groceries. Patient was max A for supine to sit on edge of bed with mod A x2 for standing with rolling walker and to take few steps towards head of bed. Patient was able to participate in grooming sitting on edge of bed with notable poor activity tolerance. Patient will benefit from skilled OT services while in hospital to improve deficits and learn compensatory strategies as needed in order to return PLOF.  Recommending SNF at time of d/c     Follow Up Recommendations  SNF    Equipment Recommendations  Tub/shower seat;3 in 1 bedside commode    Recommendations for Other Services       Precautions / Restrictions Precautions Precautions: Fall Restrictions Weight Bearing Restrictions: No      Mobility Bed Mobility Overal bed mobility: Needs Assistance Bed Mobility: Supine to Sit     Supine to sit: Max assist     General bed mobility comments: HOB elevated, assistance for leg movement, trunk and scooting forwards    Transfers Overall transfer level: Needs assistance Equipment used: Standard walker Transfers: Sit to/from Stand;Stand Pivot Transfers Sit to Stand: +2 physical assistance;Mod assist Stand pivot transfers: Min assist;+2 physical assistance       General transfer comment: walker management and  verbal cues    Balance Overall balance assessment: Needs assistance Sitting-balance support: No upper extremity supported Sitting balance-Leahy Scale: Fair     Standing balance support: Bilateral upper extremity supported Standing balance-Leahy Scale: Poor                             ADL either performed or assessed with clinical judgement   ADL Overall ADL's : Needs assistance/impaired Eating/Feeding: Set up   Grooming: Wash/dry face;Set up;Sitting   Upper Body Bathing: Minimal assistance;Cueing for safety   Lower Body Bathing: Moderate assistance;Sitting/lateral leans   Upper Body Dressing : Minimal assistance;Sitting   Lower Body Dressing: Sitting/lateral leans;Maximal assistance   Toilet Transfer: +2 for safety/equipment;Moderate assistance;BSC;Stand-pivot   Toileting- Clothing Manipulation and Hygiene: Maximal assistance;Sitting/lateral lean       Functional mobility during ADLs: Moderate assistance;+2 for safety/equipment       Vision   Vision Assessment?: No apparent visual deficits     Perception     Praxis      Pertinent Vitals/Pain Pain Assessment: No/denies pain     Hand Dominance     Extremity/Trunk Assessment Upper Extremity Assessment Upper Extremity Assessment: Generalized weakness (patients ROM was [facility])   Lower Extremity Assessment Lower Extremity Assessment: Defer to PT evaluation       Communication     Cognition Arousal/Alertness: Lethargic Behavior During Therapy: WFL for tasks assessed/performed Overall Cognitive Status: Within Functional Limits for tasks assessed  General Comments: patient was alert to self, place, and month   General Comments       Exercises     Shoulder Instructions      Home Living Family/patient expects to be discharged to:: Skilled nursing facility Living Arrangements: Alone Available Help at Discharge: Available  PRN/intermittently;Family Type of Home: House Home Access: Ramped entrance     Home Layout: One level     Bathroom Shower/Tub: Tub/shower unit         Home Equipment: Environmental consultant - 4 wheels          Prior Functioning/Environment Level of Independence: Independent with assistive device(s)        Comments: son assist with groceries        OT Problem List: Decreased strength;Impaired balance (sitting and/or standing);Decreased safety awareness;Decreased activity tolerance;Decreased knowledge of use of DME or AE      OT Treatment/Interventions: Self-care/ADL training;Therapeutic exercise;Patient/family education;Neuromuscular education;Balance training;Energy conservation;Therapeutic activities;DME and/or AE instruction    OT Goals(Current goals can be found in the care plan section) Acute Rehab OT Goals Patient Stated Goal: to get stronger OT Goal Formulation: With patient Time For Goal Achievement: 08/28/20 Potential to Achieve Goals: Fair  OT Frequency: Min 2X/week   Barriers to D/C: Decreased caregiver support  patient reported he lives at home alone.       Co-evaluation              AM-PAC OT "6 Clicks" Daily Activity     Outcome Measure Help from another person eating meals?: A Little Help from another person taking care of personal grooming?: A Little Help from another person toileting, which includes using toliet, bedpan, or urinal?: A Lot Help from another person bathing (including washing, rinsing, drying)?: A Lot Help from another person to put on and taking off regular upper body clothing?: A Little Help from another person to put on and taking off regular lower body clothing?: A Lot 6 Click Score: 15   End of Session Equipment Utilized During Treatment: Gait belt;Rolling walker Nurse Communication: Other (comment) (nursing made aware of bilateral boggy heels)  Activity Tolerance: Patient limited by fatigue Patient left: in bed;with call bell/phone  within reach;with bed alarm set  OT Visit Diagnosis: Muscle weakness (generalized) (M62.81)                Time: 2035-5974 OT Time Calculation (min): 15 min Charges:  OT General Charges $OT Visit: 1 Visit OT Evaluation $OT Eval Moderate Complexity: 1 Mod  Jeff Rodriguez, OTR/L [place]  Office [phone] Pager: [phone]   Jeff Chancellor 08/14/2020, 12:37 PM

## 2020-08-14 NOTE — Consult Note (Addendum)
[place]  Telephone:([phone]) [phone] Fax:(336) [phone]   MEDICAL ONCOLOGY - INITIAL CONSULTATION  Referral MD: Dr. Dwyane Dee  Reason for Referral: Multiple myeloma  HPI: Jeff Rodriguez is a 75 year old male with a past medical history significant for multiple myeloma, diabetes mellitus, hypertension, asthma, hyperlipidemia, osteoarthritis.  He was brought to the emergency department by EMS secondary to productive cough and poor oral intake.  The patient is undergoing chemotherapy for his multiple myeloma at the [place].  On admission, he reported a 4 to 5-day history of cough and congestion.  He was not having any fevers.  In the ER, he was found to be COVID-positive.  Admission lab work showed a WBC of 3.7, hemoglobin 7.7, platelets 86,000, sodium 129, potassium 2.6, BUN 67, creatinine 7.27, calcium 6.8, total protein 6.3, albumin 2.7.  LDH mildly elevated at 201.  Kappa free light chains were elevated at 93.2, lambda free light chains elevated at 38.1, and kappa, lambda light chain ratio elevated at 2.45.  SPEP has been obtained and results are currently pending.  Nephrology currently following.  Acute renal failure thought to be due in part from ACE inhibitor, myeloma also on the differential.  For the patient's multiple myeloma, he is followed at the [place].  He is being followed by Dr. Rico Sheehan.  Last visit appears to been on 6/2.  According to this note, the patient is taking lenalidomide 15 mg days 1 through 21 with 7 days off and dexamethasone 40 mg once a week.  He is also receiving zoledronic acid 3 mg IV and last dose was given on 6/2.  Most recent lab results performed at the [place] were on 07/23/2020 and at that time his creatinine was 1.65, BUN 19, sodium 135, potassium 2.7, calcium 8.5, WBC 4.46, hemoglobin 8.9, platelets 123,000.  I am not able to see recent SPEP and light chain results from the [place].  The patient was seen this morning and he is laying in the bed.  He is aware of  the treatment that he has been taking for his myeloma.  He cannot tell me how long he has been on this regimen.  He also states that he took "shots" in the past.  I asked him if the shots were Velcade and he recognized the name and states that they were.  He is unsure why Velcade was discontinued.  The patient reports that his breathing is better today.  He still has a cough.  He is not currently having any fevers or chills.  Nursing reports increased urine output.  No pain reported.  Overall, the patient is a poor historian and difficult to obtain history and review of systems.  Medical oncology was asked see the patient for recommendations regarding his multiple myeloma.   Past Medical History:  Diagnosis Date   Asthma    Diabetes mellitus    Hypertension   :   Past Surgical History:  Procedure Laterality Date   CHOLECYSTECTOMY N/A 04/23/2015   Procedure: LAPAROSCOPIC CHOLECYSTECTOMY WITH INTRAOPERATIVE CHOLANGIOGRAM;  Surgeon: Ralene Ok, MD;  Location: [place];  Service: General;  Laterality: N/A;   REPLACEMENT TOTAL KNEE Bilateral    TOTAL HIP ARTHROPLASTY Right   :   Current Facility-Administered Medications  Medication Dose Route Frequency Provider Last Rate Last Admin   (feeding supplement) PROSource Plus liquid 30 mL  30 mL Oral BID BM Dwyane Dee, MD   30 mL at 08/13/20 1417   0.9 %  sodium chloride infusion (Manually  program via Guardrails IV Fluids)   Intravenous Once Kathryne Eriksson, NP       acetaminophen (TYLENOL) tablet 650 mg  650 mg Oral Q6H PRN Elwyn Reach, MD       albuterol (VENTOLIN HFA) 108 (90 Base) MCG/ACT inhaler 2 puff  2 puff Inhalation Q6H PRN Elwyn Reach, MD       chlorpheniramine-HYDROcodone (TUSSIONEX) 10-8 MG/5ML suspension 5 mL  5 mL Oral Q12H PRN Elwyn Reach, MD   5 mL at 08/14/20 0136   feeding supplement (NEPRO CARB STEADY) liquid 237 mL  237 mL Oral BID BM Dwyane Dee, MD   237 mL at 08/13/20 1120   finasteride (PROSCAR)  tablet 5 mg  5 mg Oral Daily Gala Romney L, MD   5 mg at 08/13/20 0908   guaiFENesin-dextromethorphan (ROBITUSSIN DM) 100-10 MG/5ML syrup 10 mL  10 mL Oral Q4H PRN Elwyn Reach, MD       heparin injection 5,000 Units  5,000 Units Subcutaneous Q8H Elwyn Reach, MD   5,000 Units at 08/14/20 5885   insulin aspart (novoLOG) injection 0-20 Units  0-20 Units Subcutaneous TID WC Elwyn Reach, MD   3 Units at 08/13/20 1739   insulin aspart (novoLOG) injection 0-5 Units  0-5 Units Subcutaneous QHS Gala Romney L, MD       lidocaine (LIDODERM) 5 % 1 patch  1 patch Transdermal Q24H Dwyane Dee, MD   1 patch at 08/13/20 1417   ondansetron (ZOFRAN) tablet 4 mg  4 mg Oral Q6H PRN Elwyn Reach, MD       Or   ondansetron (ZOFRAN) injection 4 mg  4 mg Intravenous Q6H PRN Elwyn Reach, MD       remdesivir 100 mg in sodium chloride 0.9 % 100 mL IVPB  100 mg Intravenous Daily Lindell Spar M, RPH 200 mL/hr at 08/13/20 1123 100 mg at 08/13/20 1123   sodium bicarbonate 150 mEq in sterile water 1,150 mL infusion   Intravenous Continuous Dwyane Dee, MD 125 mL/hr at 08/14/20 0135 New Bag at 08/14/20 0135   tamsulosin (FLOMAX) capsule 0.8 mg  0.8 mg Oral QHS Gala Romney L, MD   0.8 mg at 08/13/20 2300      Allergies  Allergen Reactions   Shrimp [Shellfish Allergy] Anaphylaxis   Iodine Swelling   Sulfa Antibiotics Other (See Comments)    other   Cephalexin Rash and Hives   Gabapentin Rash   Oxacillin Rash  :   Family History  Problem Relation Age of Onset   Cancer - Lung Father    Deep vein thrombosis Mother   :   Social History   Socioeconomic History   Marital status: Widowed    Spouse name: Not on file   Number of children: Not on file   Years of education: Not on file   Highest education level: Not on file  Occupational History   Not on file  Tobacco Use   Smoking status: Never   Smokeless tobacco: Not on file  Substance and Sexual Activity    Alcohol use: No   Drug use: No   Sexual activity: Not on file  Other Topics Concern   Not on file  Social History Narrative   Not on file   Social Determinants of Health   Financial Resource Strain: Not on file  Food Insecurity: Not on file  Transportation Needs: Not on file  Physical Activity: Not on file  Stress: Not  on file  Social Connections: Not on file  Intimate Partner Violence: Not on file  :  Review of Systems: Unable to obtain comprehensive review of systems, pertinent positives/negatives noted in HPI.  Exam: Patient Vitals for the past 24 hrs:  BP Temp Temp src Pulse Resp SpO2  08/14/20 0608 106/61 98.6 F (37 C) Oral 82 -- 97 %  08/13/20 2319 (!) 104/57 98.5 F (36.9 C) Oral 74 18 95 %  08/13/20 1951 (!) 109/56 98.9 F (37.2 C) Oral 83 (!) 24 96 %  08/13/20 1712 (!) 98/57 98.9 F (37.2 C) Oral 84 16 98 %  08/13/20 1615 98/60 99.6 F (37.6 C) Oral 86 16 97 %  08/13/20 1124 (!) 99/56 98.1 F (36.7 C) Oral 77 16 --  08/13/20 0900 (!) 98/55 98.4 F (36.9 C) Oral 73 16 96 %    General: Chronically ill-appearing male, laying in bed, no distress.   Eyes:  no scleral icterus.   ENT:  There were no oropharyngeal lesions.    Lymphatics:  Negative cervical, supraclavicular or axillary adenopathy.   Respiratory: lungs were clear bilaterally without wheezing or crackles.   Cardiovascular:  Regular rate and rhythm, S1/S2, without murmur, rub or gallop.  There was no pedal edema.   GI:  abdomen was soft, flat, nontender, nondistended, without organomegaly.   Skin exam was without echymosis, petichae.   Neuro exam was nonfocal.  The patient was alert, poor historian.   Lab Results  Component Value Date   WBC 2.8 (L) 08/14/2020   HGB 7.8 (L) 08/14/2020   HCT 22.6 (L) 08/14/2020   PLT 43 (L) 08/14/2020   GLUCOSE 161 (H) 08/14/2020   TRIG 70 08/11/2020   ALT 29 08/14/2020   AST 37 08/14/2020   NA 132 (L) 08/14/2020   K 3.5 08/14/2020   CL 93 (L) 08/14/2020    CREATININE 7.23 (H) 08/14/2020   BUN 83 (H) 08/14/2020   CO2 23 08/14/2020    US RENAL  Result Date: 08/12/2020 CLINICAL DATA:  Acute renal failure. EXAM: RENAL / URINARY TRACT ULTRASOUND COMPLETE COMPARISON:  CT 06/04/2010. FINDINGS: Right Kidney: Renal measurements: 11.6 x 5.5 x 5.9 cm = volume: 197.5 mL. Increased echogenicity. No mass or hydronephrosis visualized. Left Kidney: Renal measurements: 11.5 x 6.4 x 5.3 cm = volume: 204.9 mL. Increased echogenicity. No mass or hydronephrosis visualized. Bladder: Appears normal for degree of bladder distention. Other: The prostate is enlarged at 6.1 x 3.9 x 5.3 cm. The prostate is irregular in contour. IMPRESSION: 1. Increased echogenicity both kidneys consistent chronic medical renal disease. No acute abnormality identified. No hydronephrosis or bladder distention. 2.  The prostate is enlarged and irregular in contour. Electronically Signed   By: Marcello Moores  Register   On: 08/12/2020 07:15   DG Chest Port 1 View  Result Date: 08/11/2020 CLINICAL DATA:  Cough. EXAM: PORTABLE CHEST 1 VIEW COMPARISON:  04/20/2015 FINDINGS: The lungs are clear without focal pneumonia, edema, pneumothorax or pleural effusion. The cardiopericardial silhouette is within normal limits for size. Degenerative changes noted in the right shoulder. Telemetry leads overlie the chest. IMPRESSION: No active disease. Electronically Signed   By: Misty Stanley M.D.   On: 08/11/2020 17:08     US RENAL  Result Date: 08/12/2020 CLINICAL DATA:  Acute renal failure. EXAM: RENAL / URINARY TRACT ULTRASOUND COMPLETE COMPARISON:  CT 06/04/2010. FINDINGS: Right Kidney: Renal measurements: 11.6 x 5.5 x 5.9 cm = volume: 197.5 mL. Increased echogenicity. No mass or hydronephrosis visualized. Left  Kidney: Renal measurements: 11.5 x 6.4 x 5.3 cm = volume: 204.9 mL. Increased echogenicity. No mass or hydronephrosis visualized. Bladder: Appears normal for degree of bladder distention. Other: The prostate is  enlarged at 6.1 x 3.9 x 5.3 cm. The prostate is irregular in contour. IMPRESSION: 1. Increased echogenicity both kidneys consistent chronic medical renal disease. No acute abnormality identified. No hydronephrosis or bladder distention. 2.  The prostate is enlarged and irregular in contour. Electronically Signed   By: Marcello Moores  Register   On: 08/12/2020 07:15   DG Chest Port 1 View  Result Date: 08/11/2020 CLINICAL DATA:  Cough. EXAM: PORTABLE CHEST 1 VIEW COMPARISON:  04/20/2015 FINDINGS: The lungs are clear without focal pneumonia, edema, pneumothorax or pleural effusion. The cardiopericardial silhouette is within normal limits for size. Degenerative changes noted in the right shoulder. Telemetry leads overlie the chest. IMPRESSION: No active disease. Electronically Signed   By: Misty Stanley M.D.   On: 08/11/2020 17:08     Assessment and Plan:  1.  Multiple myeloma currently followed at the [place] 2.  AKI on CKD-?  Secondary to ACE versus myeloma versus COVID related 3.  COVID-19 infection 4.  Pancytopenia due to recent chemotherapy 5.  Hypocalcemia-received recent bisphosphonate therapy 6.  Hypertension 7.  Diabetes mellitus 8.  Hyperlipidemia  -The patient is currently undergoing treatment at the [facility] for his myeloma.  He is receiving lenalidomide and dexamethasone.  He also receives bisphosphonate therapy for his myeloma. -Agree with holding lenalidomide for now given acute illness and AKI. -Light chains noted to be elevated this admission and SPEP pending.  I cannot see a baseline light chain or SPEP from the [place] to compare so difficult to say how well his myeloma is controlled. -Recommend close monitoring of his CBC.  Proceed with PRBC transfusion for hemoglobin less than 8 and for platelets less than 10,000 or active bleeding.  Neutropenia may be in part due to recent chemotherapy as well as bone marrow suppression from acute infection.  Recommend monitoring for now. -The patient has  hypocalcemia but received bisphosphonate therapy recently.  Status post calcium gluconate with improvement in his calcium level.  Would recommend oral calcium while on bisphosphonate therapy. -Management of AKI and other chronic medical conditions per nephrology and hospitalist.  Thank you for this referral.   Mikey Bussing, DNP, AGPCNP-BC, AOCNP    ADDENDUM: I saw and examined Jeff Rodriguez.  Unfortunately, he is a very poor historian.  I have to believe that myeloma is not an issue.  His myeloma studies really look okay.  He is on [name].  I have not heard of [name] causing kidney issues.  I do know that [name] really cannot be used in renal insufficiency.  His myeloma studies did not show a monoclonal spike.  I thought maybe he would have a light chain myeloma.  However, the immunoglobulins are not that bad.  He has both elevated kappa and lambda light chains.  By the urinalysis, I wonder if he has a urinary tract infection.  I suspect that he is pancytopenic because of his [name].  I cannot imagine that this would be from myeloma unless he has a non-secretory myeloma.  Again we do have no information from the [place] as to what type of myeloma that he has.  He has a very poor performance status right now.  I would not recommend a bone marrow biopsy.  I suspect that given his renal failure, he is going to be anemic.  He  probably needs ESA to be given.  He has an infection, this certainly could cause marrow suppression.  From what he told me, he had been on something that was given to him under the skin.  I suppose this was probably Velcade.  He is not sure when he was last seen at the [place].  He does not know when he goes back to the [place].  Again I think he has a urinary tract infection.  It will be interesting to see what the cultures show.  For right now, we can just follow along.  It would be nice to get quantitative immunoglobulins on him.  Again given that he has no monoclonal  spike in his blood, I cannot imagine that he has an elevated immunoglobulin level.  I would be more worried about a low immunoglobulin level.  I know that the staff on [address] are doing a fantastic job with him.  [name], MD  Psalms 6:2

## 2020-08-14 NOTE — Progress Notes (Signed)
Subjective:  At least 1200 of UOP recorded,  - crt stalled-  BUN up a little -  he is alert but he feels rough-  emesis bag next to him   Objective Vital signs in last 24 hours: Vitals:   08/13/20 2319 08/14/20 0608 08/14/20 1014 08/14/20 1348  BP: (!) 104/57 106/61  105/60  Pulse: 74 82  69  Resp: 18   20  Temp: 98.5 F (36.9 C) 98.6 F (37 C)  99 F (37.2 C)  TempSrc: Oral Oral  Oral  SpO2: 95% 97% 97% 100%  Weight:      Height:       Weight change:   Intake/Output Summary (Last 24 hours) at 08/14/2020 1351 Last data filed at 08/14/2020 0600 Gross per 24 hour  Intake 2144.73 ml  Output 1200 ml  Net 944.73 ml    Assessment/ Plan: Pt is a 75 y.o. yo male with multiple myeloma who was admitted on 08/11/2020 with COVID-  A on CRT-  crt 1.7 in Jan 2022-  now 7- non oliguric  Assessment/Plan: 1. A on CRF-  possibly taking ACE-I prior to admit-  renal u/s neg for obstruction- urine shows 30 of prot-  6-10 RB- pyuria but diff to interpret out of bag-  will order culture but urine does not seem obviously infected by visualization- UOP improving.  AKI Could be ATN from hypotension on ACE.    myeloma kidney is also in the differential-  dont have details about his status with that-  just that he is on revlimid-  he tells me for a long time.  No dialysis indications at this time, will continue to follow and hope for improvement since UOP is increased 2. HTN/volume-  not wet-  getting LR at 75 per hour-  to continue  3. Anemia-  blood transfusion 6/23-  due to myeloma and also AKI 4. Hypocalcemia-  corrected is around 7.7-  no sxms - no action needed -  got bolus yesterday  5. Hyponatremia-  correcting slowly  6. Hypokalemia-  replete PRNLouis Meckel    Labs: Basic Metabolic Panel: Recent Labs  Lab 08/12/20 0338 08/13/20 0400 08/13/20 1506 08/14/20 0328  NA 131* 131* 128* 132*  K 4.0 3.5 3.5 3.5  CL 100 103 91* 93*  CO2 13* 19* 22 23  GLUCOSE 220* 139* 116*  161*  BUN 69* 79* 80* 83*  CREATININE 7.79*  7.64* 7.10* 7.35* 7.23*  CALCIUM 6.7* 6.1* 6.3* 6.5*  PHOS 5.3* 3.0  --  4.6   Liver Function Tests: Recent Labs  Lab 08/13/20 0400 08/13/20 1506 08/14/20 0328  AST 42* 41 37  ALT $Re'30 29 29  'fdU$ ALKPHOS 41 41 40  BILITOT 0.4 0.6 0.5  PROT 5.3* 5.5* 5.0*  ALBUMIN 2.3* 2.3* 2.2*   No results for input(s): LIPASE, AMYLASE in the last 168 hours. No results for input(s): AMMONIA in the last 168 hours. CBC: Recent Labs  Lab 08/11/20 1627 08/12/20 0338 08/13/20 0400 08/13/20 1207 08/13/20 2151 08/14/20 0328  WBC 3.7* 3.9*  3.8* 2.3*  --   --  2.8*  NEUTROABS 2.6 3.1 1.5*  --   --  1.6*  HGB 7.7* 7.6*  7.6* 6.0* 7.0* 7.9* 7.8*  HCT 22.8* 23.1*  23.0* 17.9* 20.6* 23.0* 22.6*  MCV 97.0 99.1  99.1 97.3  --   --  90.8  PLT 86* 77*  77* 60*  --   --  43*  Cardiac Enzymes: No results for input(s): CKTOTAL, CKMB, CKMBINDEX, TROPONINI in the last 168 hours. CBG: Recent Labs  Lab 08/12/20 2134 08/13/20 1121 08/13/20 1649 08/14/20 0847 08/14/20 1238  GLUCAP 155* 101* 124* 113* 190*    Iron Studies:  Recent Labs    08/14/20 0328  FERRITIN 892*   Studies/Results: No results found. Medications: Infusions:  lactated ringers 75 mL/hr at 08/14/20 1251   remdesivir 100 mg in NS 100 mL 100 mg (08/14/20 0959)    Scheduled Medications:  (feeding supplement) PROSource Plus  30 mL Oral BID BM   sodium chloride   Intravenous Once   feeding supplement (NEPRO CARB STEADY)  237 mL Oral BID BM   finasteride  5 mg Oral Daily   heparin  5,000 Units Subcutaneous Q8H   insulin aspart  0-20 Units Subcutaneous TID WC   insulin aspart  0-5 Units Subcutaneous QHS   lidocaine  1 patch Transdermal Q24H   tamsulosin  0.8 mg Oral QHS    have reviewed scheduled and prn medications.  Physical Exam: General: eyes closed but responds to questions-  denies pain-  says he feels better than when he came in Heart: RRR Lungs: mostly  clear Abdomen: soft, non tender Extremities: no edema    08/14/2020,1:51 PM  LOS: 3 days

## 2020-08-14 NOTE — Progress Notes (Signed)
Progress Note    BRISTOL SOY   QPY:[id number]  DOB: December 13, 1945  DOA: 08/11/2020     3  PCP: [facility], [facility]  CC: cough, SOB  Hospital Course: Jeff Rodriguez is a 75 y.o. male with medical history significant of multiple myeloma (on [name]), diabetes, hypertension, asthma, hyperlipidemia and osteoarthritis who was brought in by EMS secondary to productive cough, poor oral intake and not eating much.  Patient is currently undergoing chemo for his multiple myeloma.  Patient was a poor historian on admission.  He lives alone with 1 son living locally and another son that calls to check on him consistently.  He is a patient of the [facility] and managed there in regards to his multiple myeloma.  He underwent work-up in the ER and tested positive for COVID-19.  He was also found to be hyponatremic, hypokalemic, and significant renal failure.  He was started on fluids and admitted for further work-up.  Interval History:  No events overnight.  He is more lethargic appearing just lying curled up on his right side.  When asked how he feels he just says "okay".  But he certainly looks very lethargic and tired.  He is still not eating well.  Just seems to want to lay there and rest. Tried discussing CODE STATUS a little bit and he was undecided and wished to think about it some more.  Currently remains full code which he understands. Attempted to call son this afternoon but no answer, will continue to retry.  ROS: Constitutional: negative for fevers, Respiratory: negative for cough and sputum, Cardiovascular: negative for chest pain, and Gastrointestinal: negative for abdominal pain  Assessment & Plan: * AKI (acute kidney injury) ([place]) - No recent labs to review but renal function in January 2022 was creatinine 1.74, BUN 27, GFR 44 -Presents with creatinine 7.27, BUN 67 -Wide differential for etiology but possibly/hopefully due to prolonged prerenal from poor oral intake  which may have led to an ATN; otherwise could also be related to underlying multiple myeloma or COVID-related - ACEi on hold; renal u/s negative for obstruction - appreciate nephrology involvement as well given his large change in renal fxn - continue IVF for now; stopped bicarb and started LR (acidosis improved)  Increased anion gap metabolic acidosis - etiology due to renal failure - s/p bicarb drip. Stopped on 6/24 - trend BMP  Pancytopenia ([place]) - presumed from chemo - trend CBC - Hgb down to 6 g/dL on 6/23. No obvious bleeding. BUN went up some too so will watch this as well - s/p 2 units PRBC, post H/H is 7.8 g/dL today  COVID-19 virus infection - main symptoms are lethargy/malaise, poor oral intake. No respiratory sxms - CT value 22 - given hx MM and treatment for it, will go ahead and initiate remdesivir while inpatient - trend inflammatory markers - no indication for steroids at this time  Multiple myeloma ([place]) - on [name] - follows at [facility] - currently [name] on hold per pharmacy - heme/onc consulted on 6/24 due to minimal renal improvement to further weigh in   Hypokalemia - treated - trend BMP  Essential hypertension - continue current regimen  Diabetes mellitus with complication ([place]) - continue SSI and CBG monitoring   Hyperlipidemia - hold statin for now   Old records reviewed in assessment of this patient  Antimicrobials: Remdesivir 6/22 >> current  DVT prophylaxis: heparin injection 5,000 Units Start: 08/11/20 2315   Code Status:   Code  Status: Full Code Family Communication: son  Disposition Plan: Status is: Inpatient  Remains inpatient appropriate because:IV treatments appropriate due to intensity of illness or inability to take PO and Inpatient level of care appropriate due to severity of illness  Dispo: The patient is from: Home              Anticipated d/c is to:  pending PT eval              Patient currently is not  medically stable to d/c.   Difficult to place patient No  Risk of unplanned readmission score: Unplanned Admission- Pilot do not use: 24.95   Objective: Blood pressure 105/60, pulse 69, temperature 99 F (37.2 C), temperature source Oral, resp. rate 20, height _0  (1.727 m), weight 63.1 kg, SpO2 100 %.  Examination: General appearance:  Elderly gentleman in no distress but appears very fatigued Head: Normocephalic, without obvious abnormality, atraumatic Eyes:  EOMI Lungs: clear to auscultation bilaterally Heart: regular rate and rhythm and S1, S2 normal Abdomen: normal findings: bowel sounds normal and soft, non-tender Extremities:  No edema Skin:  Diffuse dry skin Neurologic: Grossly normal  Consultants:  Nephrology Oncology  Procedures:    Data Reviewed: I have personally reviewed following labs and imaging studies Results for orders placed or performed during the hospital encounter of 08/11/20 (from the past 24 hour(s))  Glucose, capillary     Status: Abnormal   Collection Time: 08/13/20  4:49 PM  Result Value Ref Range   Glucose-Capillary 124 (H) 70 - 99 mg/dL  Urinalysis, Routine w reflex microscopic Urine, Random     Status: Abnormal   Collection Time: 08/13/20  5:22 PM  Result Value Ref Range   Color, Urine STRAW (A) YELLOW   APPearance HAZY (A) CLEAR   Specific Gravity, Urine 1.009 1.005 - 1.030   pH 6.0 5.0 - 8.0   Glucose, UA 50 (A) NEGATIVE mg/dL   Hgb urine dipstick MODERATE (A) NEGATIVE   Bilirubin Urine NEGATIVE NEGATIVE   Ketones, ur NEGATIVE NEGATIVE mg/dL   Protein, ur 30 (A) NEGATIVE mg/dL   Nitrite NEGATIVE NEGATIVE   Leukocytes,Ua LARGE (A) NEGATIVE   RBC / HPF 6-10 0 - 5 RBC/hpf   WBC, UA >50 (H) 0 - 5 WBC/hpf   Bacteria, UA MANY (A) NONE SEEN  Hemoglobin and hematocrit, blood     Status: Abnormal   Collection Time: 08/13/20  9:51 PM  Result Value Ref Range   Hemoglobin 7.9 (L) 13.0 - 17.0 g/dL   HCT 23.0 (L) 39.0 - 52.0 %  CBC with  Differential/Platelet     Status: Abnormal   Collection Time: 08/14/20  3:28 AM  Result Value Ref Range   WBC 2.8 (L) 4.0 - 10.5 K/uL   RBC 2.49 (L) 4.22 - 5.81 MIL/uL   Hemoglobin 7.8 (L) 13.0 - 17.0 g/dL   HCT 22.6 (L) 39.0 - 52.0 %   MCV 90.8 80.0 - 100.0 fL   MCH 31.3 26.0 - 34.0 pg   MCHC 34.5 30.0 - 36.0 g/dL   RDW 17.7 (H) 11.5 - 15.5 %   Platelets 43 (L) 150 - 400 K/uL   nRBC 0.0 0.0 - 0.2 %   Neutrophils Relative % 57 %   Neutro Abs 1.6 (L) 1.7 - 7.7 K/uL   Lymphocytes Relative 24 %   Lymphs Abs 0.7 0.7 - 4.0 K/uL   Monocytes Relative 12 %   Monocytes Absolute 0.3 0.1 - 1.0 K/uL  Eosinophils Relative 2 %   Eosinophils Absolute 0.1 0.0 - 0.5 K/uL   Basophils Relative 0 %   Basophils Absolute 0.0 0.0 - 0.1 K/uL   Immature Granulocytes 5 %   Abs Immature Granulocytes 0.15 (H) 0.00 - 0.07 K/uL   Reactive, Benign Lymphocytes PRESENT   Comprehensive metabolic panel     Status: Abnormal   Collection Time: 08/14/20  3:28 AM  Result Value Ref Range   Sodium 132 (L) 135 - 145 mmol/L   Potassium 3.5 3.5 - 5.1 mmol/L   Chloride 93 (L) 98 - 111 mmol/L   CO2 23 22 - 32 mmol/L   Glucose, Bld 161 (H) 70 - 99 mg/dL   BUN 83 (H) 8 - 23 mg/dL   Creatinine, Ser 7.23 (H) 0.61 - 1.24 mg/dL   Calcium 6.5 (L) 8.9 - 10.3 mg/dL   Total Protein 5.0 (L) 6.5 - 8.1 g/dL   Albumin 2.2 (L) 3.5 - 5.0 g/dL   AST 37 15 - 41 U/L   ALT 29 0 - 44 U/L   Alkaline Phosphatase 40 38 - 126 U/L   Total Bilirubin 0.5 0.3 - 1.2 mg/dL   GFR, Estimated 7 (L) >60 mL/min   Anion gap 16 (H) 5 - 15  C-reactive protein     Status: Abnormal   Collection Time: 08/14/20  3:28 AM  Result Value Ref Range   CRP 17.6 (H) <1.0 mg/dL  D-dimer, quantitative     Status: Abnormal   Collection Time: 08/14/20  3:28 AM  Result Value Ref Range   D-Dimer, Quant 3.68 (H) 0.00 - 0.50 ug/mL-FEU  Ferritin     Status: Abnormal   Collection Time: 08/14/20  3:28 AM  Result Value Ref Range   Ferritin 892 (H) 24 - 336 ng/mL   Magnesium     Status: None   Collection Time: 08/14/20  3:28 AM  Result Value Ref Range   Magnesium 1.8 1.7 - 2.4 mg/dL  Phosphorus     Status: None   Collection Time: 08/14/20  3:28 AM  Result Value Ref Range   Phosphorus 4.6 2.5 - 4.6 mg/dL  Glucose, capillary     Status: Abnormal   Collection Time: 08/14/20  8:47 AM  Result Value Ref Range   Glucose-Capillary 113 (H) 70 - 99 mg/dL  Glucose, capillary     Status: Abnormal   Collection Time: 08/14/20 12:38 PM  Result Value Ref Range   Glucose-Capillary 190 (H) 70 - 99 mg/dL    Recent Results (from the past 240 hour(s))  Resp Panel by RT-PCR (Flu A&B, Covid) Nasopharyngeal Swab     Status: Abnormal   Collection Time: 08/11/20  3:50 PM   Specimen: Nasopharyngeal Swab; Nasopharyngeal(NP) swabs in vial transport medium  Result Value Ref Range Status   SARS Coronavirus 2 by RT PCR POSITIVE (A) NEGATIVE Final    Comment: RESULT CALLED TO, READ BACK BY AND VERIFIED WITH: M.BELFI AT 1949 ON 06.21.22 BY N.THOMPSON (NOTE) SARS-CoV-2 target nucleic acids are DETECTED.  The SARS-CoV-2 RNA is generally detectable in upper respiratory specimens during the acute phase of infection. Positive results are indicative of the presence of the identified virus, but do not rule out bacterial infection or co-infection with other pathogens not detected by the test. Clinical correlation with patient history and other diagnostic information is necessary to determine patient infection status. The expected result is Negative.  Fact Sheet for Patients: EntrepreneurPulse.com.au  Fact Sheet for Healthcare Providers: IncredibleEmployment.be  This test  is not yet approved or cleared by the [place] and  has been authorized for detection and/or diagnosis of SARS-CoV-2 by FDA under an Emergency Use Authorization (EUA).  This EUA will remain in effect (meaning this tes t can be used) for the duration of  the  COVID-19 declaration under Section 564(b)(1) of the Act, 21 U.S.C. section 360bbb-3(b)(1), unless the authorization is terminated or revoked sooner.     Influenza A by PCR NEGATIVE NEGATIVE Final   Influenza B by PCR NEGATIVE NEGATIVE Final    Comment: (NOTE) The Xpert Xpress SARS-CoV-2/FLU/RSV plus assay is intended as an aid in the diagnosis of influenza from Nasopharyngeal swab specimens and should not be used as a sole basis for treatment. Nasal washings and aspirates are unacceptable for Xpert Xpress SARS-CoV-2/FLU/RSV testing.  Fact Sheet for Patients: EntrepreneurPulse.com.au  Fact Sheet for Healthcare Providers: IncredibleEmployment.be  This test is not yet approved or cleared by the [place] FDA and has been authorized for detection and/or diagnosis of SARS-CoV-2 by FDA under an Emergency Use Authorization (EUA). This EUA will remain in effect (meaning this test can be used) for the duration of the COVID-19 declaration under Section 564(b)(1) of the Act, 21 U.S.C. section 360bbb-3(b)(1), unless the authorization is terminated or revoked.  Performed at [facility], [place] [address], [place], [place] [address]   Blood Culture (routine x 2)     Status: None (Preliminary result)   Collection Time: 08/11/20  3:50 PM   Specimen: BLOOD  Result Value Ref Range Status   Specimen Description   Final    BLOOD BLOOD RIGHT FOREARM Performed at [facility] [address] [place], [place] [address]    Special Requests   Final    BOTTLES DRAWN AEROBIC AND ANAEROBIC Blood Culture adequate volume Performed at [facility] [address], [place], [place] [address]    Culture   Final    NO GROWTH 2 DAYS Performed at [facility] [address] [place], [place] [address]    Report Status PENDING  Incomplete  Blood Culture (routine x 2)     Status: None (Preliminary result)    Collection Time: 08/11/20  3:55 PM   Specimen: BLOOD  Result Value Ref Range Status   Specimen Description   Final    BLOOD BLOOD LEFT FOREARM Performed at [facility] [address] [place], [place] [address]    Special Requests   Final    BOTTLES DRAWN AEROBIC AND ANAEROBIC Blood Culture adequate volume Performed at [facility] [address] [place], [place] [address]    Culture   Final    NO GROWTH 2 DAYS Performed at [facility] [address] [place], [place] [address]    Report Status PENDING  Incomplete     Radiology Studies: No results found. US RENAL  Final Result    DG Chest Port 1 View  Final Result      Scheduled Meds:  (feeding supplement) PROSource Plus  30 mL Oral BID BM   sodium chloride   Intravenous Once   feeding supplement (NEPRO CARB STEADY)  237 mL Oral BID BM   finasteride  5 mg Oral Daily   heparin  5,000 Units Subcutaneous Q8H   insulin aspart  0-20 Units Subcutaneous TID WC   insulin aspart  0-5 Units Subcutaneous QHS   lidocaine  1 patch Transdermal Q24H   tamsulosin  0.8 mg Oral QHS   PRN Meds: acetaminophen, albuterol, chlorpheniramine-HYDROcodone, guaiFENesin-dextromethorphan, ondansetron **OR** ondansetron (ZOFRAN) IV Continuous Infusions:  lactated ringers 75 mL/hr at 08/14/20 1251   remdesivir 100 mg in NS 100 mL 100 mg (08/14/20 0959)     LOS: 3 days  Time spent: Greater than 50% of the 35 minute visit was spent in counseling/coordination of care for the patient as laid out in the A&P.   Dwyane Dee, MD [facility] 08/14/2020, 3:12 PM

## 2020-08-14 NOTE — Plan of Care (Signed)
  Problem: Education: Goal: Knowledge of General Education information will improve Description Including pain rating scale, medication(s)/side effects and non-pharmacologic comfort measures Outcome: Progressing   

## 2020-08-15 DIAGNOSIS — R627 Adult failure to thrive: Secondary | ICD-10-CM

## 2020-08-15 DIAGNOSIS — N1832 Chronic kidney disease, stage 3b: Secondary | ICD-10-CM | POA: Diagnosis not present

## 2020-08-15 DIAGNOSIS — U071 COVID-19: Secondary | ICD-10-CM | POA: Diagnosis not present

## 2020-08-15 DIAGNOSIS — N179 Acute kidney failure, unspecified: Secondary | ICD-10-CM | POA: Diagnosis not present

## 2020-08-15 LAB — URINE CULTURE

## 2020-08-15 LAB — COMPREHENSIVE METABOLIC PANEL
ALT: 22 U/L (ref 0–44)
AST: 25 U/L (ref 15–41)
Albumin: 2 g/dL — ABNORMAL LOW (ref 3.5–5.0)
Alkaline Phosphatase: 39 U/L (ref 38–126)
Anion gap: 14 (ref 5–15)
BUN: 86 mg/dL — ABNORMAL HIGH (ref 8–23)
CO2: 26 mmol/L (ref 22–32)
Calcium: 5.9 mg/dL — CL (ref 8.9–10.3)
Chloride: 89 mmol/L — ABNORMAL LOW (ref 98–111)
Creatinine, Ser: 7.47 mg/dL — ABNORMAL HIGH (ref 0.61–1.24)
GFR, Estimated: 7 mL/min — ABNORMAL LOW (ref >60)
Glucose, Bld: 111 mg/dL — ABNORMAL HIGH (ref 70–99)
Potassium: 2.9 mmol/L — ABNORMAL LOW (ref 3.5–5.1)
Sodium: 129 mmol/L — ABNORMAL LOW (ref 135–145)
Total Bilirubin: 0.5 mg/dL (ref 0.3–1.2)
Total Protein: 4.9 g/dL — ABNORMAL LOW (ref 6.5–8.1)

## 2020-08-15 LAB — CBC WITH DIFFERENTIAL/PLATELET
Band Neutrophils: 10 %
Basophils Relative: 0 %
Blasts: 1 %
Eosinophils Relative: 2 %
HCT: 23.2 % — ABNORMAL LOW (ref 39.0–52.0)
Hemoglobin: 8.2 g/dL — ABNORMAL LOW (ref 13.0–17.0)
Lymphocytes Relative: 20 %
MCH: 32 pg (ref 26.0–34.0)
MCHC: 35.3 g/dL (ref 30.0–36.0)
MCV: 90.6 fL (ref 80.0–100.0)
Metamyelocytes Relative: 0 %
Monocytes Relative: 16 %
Myelocytes: 0 %
Neutrophils Relative %: 52 %
Platelets: 29 10*3/uL — CL (ref 150–400)
Promyelocytes Relative: 0 %
RBC Morphology: NORMAL
RBC: 2.56 MIL/uL — ABNORMAL LOW (ref 4.22–5.81)
RDW: 17.7 % — ABNORMAL HIGH (ref 11.5–15.5)
WBC: 1.6 10*3/uL — ABNORMAL LOW (ref 4.0–10.5)
nRBC: 0 % (ref 0.0–0.2)
nRBC: NONE SEEN /100 WBC

## 2020-08-15 LAB — PROCALCITONIN: Procalcitonin: 1.12 ng/mL

## 2020-08-15 LAB — C-REACTIVE PROTEIN: CRP: 21.2 mg/dL — ABNORMAL HIGH (ref <1.0)

## 2020-08-15 LAB — GLUCOSE, CAPILLARY
Glucose-Capillary: 135 mg/dL — ABNORMAL HIGH (ref 70–99)
Glucose-Capillary: 147 mg/dL — ABNORMAL HIGH (ref 70–99)
Glucose-Capillary: 175 mg/dL — ABNORMAL HIGH (ref 70–99)
Glucose-Capillary: 132 mg/dL — ABNORMAL HIGH (ref 70–99)

## 2020-08-15 LAB — PHOSPHORUS: Phosphorus: 4.4 mg/dL (ref 2.5–4.6)

## 2020-08-15 LAB — FERRITIN: Ferritin: 970 ng/mL — ABNORMAL HIGH (ref 24–336)

## 2020-08-15 LAB — D-DIMER, QUANTITATIVE: D-Dimer, Quant: 3.49 ug/mL-FEU — ABNORMAL HIGH (ref 0.00–0.50)

## 2020-08-15 LAB — MAGNESIUM: Magnesium: 1.6 mg/dL — ABNORMAL LOW (ref 1.7–2.4)

## 2020-08-15 MED ORDER — TAMSULOSIN HCL 0.4 MG PO CAPS
0.4000 mg | ORAL_CAPSULE | Freq: Every day | ORAL | Status: DC
  Administered 2020-08-15 – 2020-09-11 (×25): 0.4 mg via ORAL
  Filled 2020-08-15 (×27): qty 1

## 2020-08-15 MED ORDER — MAGNESIUM SULFATE 2 GM/50ML IV SOLN
2.0000 g | Freq: Once | INTRAVENOUS | Status: AC
  Administered 2020-08-15: 2 g via INTRAVENOUS
  Filled 2020-08-15: qty 50

## 2020-08-15 MED ORDER — SODIUM CHLORIDE 0.9 % IV SOLN
1.0000 g | INTRAVENOUS | Status: AC
  Administered 2020-08-15: 1 g via INTRAVENOUS
  Filled 2020-08-15: qty 1

## 2020-08-15 MED ORDER — POTASSIUM CHLORIDE CRYS ER 20 MEQ PO TBCR
40.0000 meq | EXTENDED_RELEASE_TABLET | Freq: Two times a day (BID) | ORAL | Status: DC
  Filled 2020-08-15: qty 2

## 2020-08-15 MED ORDER — POTASSIUM CHLORIDE 20 MEQ PO PACK
40.0000 meq | PACK | Freq: Once | ORAL | Status: AC
  Administered 2020-08-15: 40 meq via ORAL
  Filled 2020-08-15: qty 2

## 2020-08-15 MED ORDER — ALUM & MAG HYDROXIDE-SIMETH 200-200-20 MG/5ML PO SUSP
30.0000 mL | ORAL | Status: DC | PRN
  Administered 2020-08-15 – 2020-08-16 (×3): 30 mL via ORAL
  Filled 2020-08-15 (×3): qty 30

## 2020-08-15 MED ORDER — CALCIUM GLUCONATE-NACL 1-0.675 GM/50ML-% IV SOLN
1.0000 g | Freq: Once | INTRAVENOUS | Status: AC
  Administered 2020-08-15: 1000 mg via INTRAVENOUS
  Filled 2020-08-15: qty 50

## 2020-08-15 MED ORDER — SODIUM CHLORIDE 0.9 % IV SOLN
1.0000 g | INTRAVENOUS | Status: DC
  Administered 2020-08-16 – 2020-08-18 (×3): 1 g via INTRAVENOUS
  Filled 2020-08-15 (×3): qty 1

## 2020-08-15 MED ORDER — SODIUM CHLORIDE 0.9 % IV SOLN
12.5000 mg | Freq: Four times a day (QID) | INTRAVENOUS | Status: DC | PRN
  Administered 2020-08-21: 12.5 mg via INTRAVENOUS
  Filled 2020-08-15 (×2): qty 0.5

## 2020-08-15 NOTE — Plan of Care (Signed)
  Problem: Education: Goal: Knowledge of General Education information will improve Description: Including pain rating scale, medication(s)/side effects and non-pharmacologic comfort measures Outcome: Progressing   Problem: Health Behavior/Discharge Planning: Goal: Ability to manage health-related needs will improve Outcome: Progressing   Problem: Clinical Measurements: Goal: Ability to maintain clinical measurements within normal limits will improve Outcome: Progressing Goal: Will remain free from infection Outcome: Progressing Goal: Diagnostic test results will improve Outcome: Progressing Goal: Respiratory complications will improve Outcome: Progressing Goal: Cardiovascular complication will be avoided Outcome: Progressing   Problem: Activity: Goal: Risk for activity intolerance will decrease Outcome: Progressing   Problem: Nutrition: Goal: Adequate nutrition will be maintained Outcome: Progressing Pt still with poor PO intake, a bit of gaging with applesauce tonight with medication-Pt bowel sounds good, but belching and c/o indigestion   Problem: Coping: Goal: Level of anxiety will decrease Outcome: Progressing   Problem: Elimination: Goal: Will not experience complications related to bowel motility Outcome: Progressing Goal: Will not experience complications related to urinary retention Outcome: Progressing   Problem: Pain Managment: Goal: General experience of comfort will improve Outcome: Progressing Pt with no complaints of pain   Problem: Safety: Goal: Ability to remain free from injury will improve Outcome: Progressing   Problem: Skin Integrity: Goal: Risk for impaired skin integrity will decrease Outcome: Progressing   Problem: Education: Goal: Knowledge of risk factors and measures for prevention of condition will improve Outcome: Progressing   Problem: Coping: Goal: Psychosocial and spiritual needs will be supported Outcome: Progressing    Problem: Respiratory: Goal: Will maintain a patent airway Outcome: Progressing Pt with productive cough, small amount of thick brown/green secretions Goal: Complications related to the disease process, condition or treatment will be avoided or minimized Outcome: Progressing

## 2020-08-15 NOTE — Progress Notes (Signed)
Subjective:  At least 1130 of UOP recorded,  - BUN and crt the same -  he is alert but he feels rough-  family - son here at bedside-  tells me that pt has been declining of late but does not know a lot of details regarding recent renal function -     Objective Vital signs in last 24 hours: Vitals:   08/14/20 1348 08/14/20 2054 08/15/20 0553 08/15/20 0840  BP: 105/60 (!) 93/45 (!) 97/54 (!) 101/52  Pulse: 69 72 69 63  Resp: 20   18  Temp: 99 F (37.2 C) 98.9 F (37.2 C) 98.3 F (36.8 C) 98.3 F (36.8 C)  TempSrc: Oral Oral Oral Oral  SpO2: 100% 97% 99% 99%  Weight:      Height:       Weight change:   Intake/Output Summary (Last 24 hours) at 08/15/2020 1117 Last data filed at 08/15/2020 3007 Gross per 24 hour  Intake 1878.71 ml  Output 1430 ml  Net 448.71 ml    Assessment/ Plan: Pt is a 75 y.o. yo male with multiple myeloma who was admitted on 08/11/2020 with COVID-  A on CRT-  crt 1.7 in Jan 2022-  now 7- non oliguric  Assessment/Plan: 1. A on CRF-  possibly taking ACE-I prior to admit-  renal u/s neg for obstruction- urine shows 30 of prot-  6-10 RBC- pyuria but diff to interpret out of bag-  will order culture but urine does not seem obviously infected by visualization- UOP improving.  AKI Could be ATN from hypotension on ACE.    myeloma kidney is also in the differential-  dont have details about his status with that-  just that he is on revlimid-  he tells me for a long time.  No dialysis indications at this time, but he clearly is not doing well, failing to thrive-  could be uremic.  I think we are doing what we can-  trying to set the stage for renal improvement.  Found out that patient helped his wife do home hemo for may years prior to her death.  He tells me today that he would not do dialysis and understands the consequences of not doing it 2. HTN/volume-  not wet-  getting LR at 75 per hour-  to continue  3. Anemia-  blood transfusion 6/23-  due to myeloma and also AKI-   stable to improved 4. Hypocalcemia-  corrected is around 7.7-  no sxms - no action needed -  got bolus during hosp 5. Hyponatremia-  waxing and waning 6. Hypokalemia-  replete PRN-  will give today     Louis Meckel    Labs: Basic Metabolic Panel: Recent Labs  Lab 08/13/20 0400 08/13/20 1506 08/14/20 0328 08/15/20 0417  NA 131* 128* 132* 129*  K 3.5 3.5 3.5 2.9*  CL 103 91* 93* 89*  CO2 19* $Remov'22 23 26  'qAKjRv$ GLUCOSE 139* 116* 161* 111*  BUN 79* 80* 83* 86*  CREATININE 7.10* 7.35* 7.23* 7.47*  CALCIUM 6.1* 6.3* 6.5* 5.9*  PHOS 3.0  --  4.6 4.4   Liver Function Tests: Recent Labs  Lab 08/13/20 1506 08/14/20 0328 08/15/20 0417  AST 41 37 25  ALT $Re'29 29 22  'rYZ$ ALKPHOS 41 40 39  BILITOT 0.6 0.5 0.5  PROT 5.5* 5.0* 4.9*  ALBUMIN 2.3* 2.2* 2.0*   No results for input(s): LIPASE, AMYLASE in the last 168 hours. No results for input(s): AMMONIA in the last 168 hours.  CBC: Recent Labs  Lab 08/11/20 1627 08/12/20 0338 08/13/20 0400 08/13/20 1207 08/13/20 2151 08/14/20 0328 08/15/20 0417  WBC 3.7* 3.9*  3.8* 2.3*  --   --  2.8* 1.6*  NEUTROABS 2.6 3.1 1.5*  --   --  1.6*  --   HGB 7.7* 7.6*  7.6* 6.0*   < > 7.9* 7.8* 8.2*  HCT 22.8* 23.1*  23.0* 17.9*   < > 23.0* 22.6* 23.2*  MCV 97.0 99.1  99.1 97.3  --   --  90.8 90.6  PLT 86* 77*  77* 60*  --   --  43* 29*   < > = values in this interval not displayed.   Cardiac Enzymes: No results for input(s): CKTOTAL, CKMB, CKMBINDEX, TROPONINI in the last 168 hours. CBG: Recent Labs  Lab 08/14/20 0847 08/14/20 1238 08/14/20 1729 08/14/20 2050 08/15/20 0749  GLUCAP 113* 190* 90 162* 135*    Iron Studies:  Recent Labs    08/15/20 0417  FERRITIN 970*   Studies/Results: No results found. Medications: Infusions:  lactated ringers 75 mL/hr at 08/15/20 0229   meropenem (MERREM) IV     [START ON 08/16/2020] meropenem (MERREM) IV     promethazine (PHENERGAN) injection (IM or IVPB)     remdesivir 100 mg in NS  100 mL 100 mg (08/15/20 0849)    Scheduled Medications:  (feeding supplement) PROSource Plus  30 mL Oral BID BM   sodium chloride   Intravenous Once   feeding supplement (NEPRO CARB STEADY)  237 mL Oral BID BM   finasteride  5 mg Oral Daily   heparin  5,000 Units Subcutaneous Q8H   insulin aspart  0-20 Units Subcutaneous TID WC   insulin aspart  0-5 Units Subcutaneous QHS   lidocaine  1 patch Transdermal Q24H   tamsulosin  0.8 mg Oral QHS    have reviewed scheduled and prn medications.  Physical Exam: General: eyes closed but responds to questions-  denies pain-  not eating  Heart: RRR Lungs: mostly clear Abdomen: soft, non tender Extremities: no edema    08/15/2020,11:17 AM  LOS: 4 days

## 2020-08-15 NOTE — Progress Notes (Signed)
MD notified that patient had a bout of coffee ground emesis unmeasured. Patient's vitals were 101/52 with a HR of 63. O2 saturation is 98% on room air/ MD responded and came to see patient.

## 2020-08-15 NOTE — NC FL2 (Signed)
[place] LEVEL OF CARE SCREENING TOOL     IDENTIFICATION  Patient Name: Jeff Rodriguez Birthdate: December 26, 1945 Sex: male Admission Date (Current Location): 08/11/2020  [facility] and [place] Number:  Herbalist and Address:  [facility],  [address] Salineno Jeff Rodriguez, [place]      Provider Number: [id number]  Attending Physician Name and Address:  Dwyane Dee, MD  Relative Name and Phone Number:  Walter, Grima)   [phone]    Current Level of Care: Hospital Recommended Level of Care: [place] Prior Approval Number:    Date Approved/Denied:   PASRR Number: [id number]  Discharge Plan: SNF    Current Diagnoses: Patient Active Problem List   Diagnosis Date Noted   Failure to thrive in adult 08/15/2020   Multiple myeloma ([place]) 08/12/2020   Increased anion gap metabolic acidosis 73/53/2992   Acute renal failure superimposed on stage 3b chronic kidney disease ([place]) 08/11/2020   COVID-19 virus infection 08/11/2020   Hypokalemia 08/11/2020   Pancytopenia ([place]) 08/11/2020   Cholecystitis, acute s/p lap chole 04/23/15 04/24/2015   Chest pain 04/20/2015   Chest wall pain 04/20/2015   DM2 (diabetes mellitus, type 2) ([place]) 04/20/2015   Hyperlipidemia 04/20/2015   BPH (benign prostatic hyperplasia) 04/20/2015   Diabetes mellitus with complication (HCC)    Essential hypertension     Orientation RESPIRATION BLADDER Height & Weight     Self, Situation, Place  Normal External catheter Weight: 63.1 kg Height:  $Remove'5\' 8"'[id number]$  (172.7 cm)  BEHAVIORAL SYMPTOMS/MOOD NEUROLOGICAL BOWEL NUTRITION STATUS   (none)  (none) Incontinent Diet (see d/c summary)  AMBULATORY STATUS COMMUNICATION OF NEEDS Skin   Extensive Assist Verbally Normal                       Personal Care Assistance Level of Assistance  Bathing, Feeding, Dressing Bathing Assistance: Maximum assistance Feeding assistance: Limited assistance Dressing Assistance:  Maximum assistance     Functional Limitations Info  Sight, Hearing, Speech Sight Info: Adequate Hearing Info: Adequate Speech Info: Adequate    SPECIAL CARE FACTORS FREQUENCY  PT (By licensed PT), OT (By licensed OT)     PT Frequency: 5X/W OT Frequency: 5X/W            Contractures Contractures Info: Not present    Additional Factors Info  Code Status, Allergies Code Status Info: full Allergies Info: Shrimp (Shellfish Allergy)   Iodine   Sulfa Antibiotics   Cephalexin   Gabapentin   Oxacillin           Current Medications (08/15/2020):  This is the current hospital active medication list Current Facility-Administered Medications  Medication Dose Route Frequency Provider Last Rate Last Admin   (feeding supplement) PROSource Plus liquid 30 mL  30 mL Oral BID BM Dwyane Dee, MD   30 mL at 08/15/20 1322   0.9 %  sodium chloride infusion (Manually program via Guardrails IV Fluids)   Intravenous Once Kathryne Eriksson, NP       acetaminophen (TYLENOL) tablet 650 mg  650 mg Oral Q6H PRN Elwyn Reach, MD       albuterol (VENTOLIN HFA) 108 (90 Base) MCG/ACT inhaler 2 puff  2 puff Inhalation Q6H PRN Elwyn Reach, MD       chlorpheniramine-HYDROcodone (TUSSIONEX) 10-8 MG/5ML suspension 5 mL  5 mL Oral Q12H PRN Elwyn Reach, MD   5 mL at 08/15/20 1323   feeding supplement (NEPRO CARB STEADY) liquid  237 mL  237 mL Oral BID BM Dwyane Dee, MD   237 mL at 08/15/20 1339   finasteride (PROSCAR) tablet 5 mg  5 mg Oral Daily Gala Romney L, MD   5 mg at 08/15/20 0844   guaiFENesin-dextromethorphan (ROBITUSSIN DM) 100-10 MG/5ML syrup 10 mL  10 mL Oral Q4H PRN Elwyn Reach, MD       insulin aspart (novoLOG) injection 0-20 Units  0-20 Units Subcutaneous TID WC Elwyn Reach, MD   3 Units at 08/15/20 1322   insulin aspart (novoLOG) injection 0-5 Units  0-5 Units Subcutaneous QHS Elwyn Reach, MD       lactated ringers infusion   Intravenous Continuous Dwyane Dee, MD 75 mL/hr at 08/15/20 0229 New Bag at 08/15/20 0229   lidocaine (LIDODERM) 5 % 1 patch  1 patch Transdermal Q24H Dwyane Dee, MD   1 patch at 08/15/20 1324   [START ON 08/16/2020] meropenem (MERREM) 1 g in sodium chloride 0.9 % 100 mL IVPB  1 g Intravenous Q24H Gadhia, [name], RPH       ondansetron [facility]) tablet 4 mg  4 mg Oral Q6H PRN Elwyn Reach, MD       Or   ondansetron (ZOFRAN) injection 4 mg  4 mg Intravenous Q6H PRN Elwyn Reach, MD   4 mg at 08/15/20 0858   promethazine (PHENERGAN) 12.5 mg in sodium chloride 0.9 % 50 mL IVPB  12.5 mg Intravenous Q6H PRN Dwyane Dee, MD       remdesivir 100 mg in sodium chloride 0.9 % 100 mL IVPB  100 mg Intravenous Daily Luiz Ochoa, RPH 200 mL/hr at 08/15/20 0849 100 mg at 08/15/20 0849   tamsulosin (FLOMAX) capsule 0.4 mg  0.4 mg Oral QHS Corliss Parish, MD         Discharge Medications: Please see discharge summary for a list of discharge medications.  Relevant Imaging Results:  Relevant Lab Results:   Additional Information [place], [place]

## 2020-08-15 NOTE — Assessment & Plan Note (Addendum)
-   Likely multifactorial but even his son states patient has not been eating well prior to diagnosis of COVID.  He is also still not taking in hardly any nutrition during hospitalization and he continues to grow weaker each day.  I also discussed the possibility of short-term enteral feeding with an NG tube and the patient is currently declining that at this time.  I have also relayed this conversation and decision to his son, [name]. -I also discussed [place] with the patient bedside daily. He again is weaker today compared to yesterday but no obvious confusion or uremic signs yet.  - He still wishes to remain full code esp after discussing possibility of HD -Patient's prognosis is very guarded and worsening by the day - see discussion above too. Will consult Palliative care to help continue conversations. Very concerned about his progressive decline and resuscitative efforts might prove to be futile  - 6/27: place cortrak and start TF due to severe poor PO intake since admission; patient amenable as well as son [name])

## 2020-08-15 NOTE — TOC Initial Note (Signed)
Transition of Care (TOC) - Initial/Assessment Note    Patient Details  Name: Jeff Rodriguez MRN: [id number] Date of Birth: 12/21/1945  Transition of Care (TOC) CM/SW Contact:    Rodney B North, LCSW Phone Number: 08/15/2020, 4:37 PM  Clinical Narrative:   Called patient in follow up to PT recommendation of SNF.  Snuffer,Eric (Son) [phone]  answered the phone, stated his father was sleeping, and asked to speak on his behalf.  Eric confirms that they are interested in SNF.  I explained bed search process, including limited options due to COVID positive status.  He wondered what would happen if his father is no longer on isolation precautions, and I let him know we could widen the search.  He wanted to know how status would change, and I let him know that was an MD question.  He voiced understanding, and asked that he be kept in the communication loop. Bed search initiated. TOC will continue to follow during the course of hospitalization.              Expected Discharge Plan: Skilled Nursing Facility Barriers to Discharge: SNF Pending bed offer   Patient Goals and CMS Choice     Choice offered to / list presented to : Adult Children  Expected Discharge Plan and Services Expected Discharge Plan: Skilled Nursing Facility   Discharge Planning Services: CM Consult Post Acute Care Choice: Skilled Nursing Facility Living arrangements for the past 2 months: Single Family Home                                      Prior Living Arrangements/Services Living arrangements for the past 2 months: Single Family Home Lives with:: Adult Children Patient language and need for interpreter reviewed:: Yes        Need for Family Participation in Patient Care: Yes (Comment) Care giver support system in place?: Yes (comment)   Criminal Activity/Legal Involvement Pertinent to Current Situation/Hospitalization: No - Comment as needed  Activities of Daily Living   ADL Screening  (condition at time of admission) Does the patient have difficulty seeing, even when wearing glasses/contacts?: No  Permission Sought/Granted Permission sought to share information with : Family Supports Permission granted to share information with : Yes, Verbal Permission Granted  Share Information with NAME: Rodriguez,Eric (Son)   [phone]           Emotional Assessment       Orientation: : Oriented to Self, Oriented to Place, Oriented to Situation Alcohol / Substance Use: Not Applicable Psych Involvement: No (comment)  Admission diagnosis:  AKI (acute kidney injury) (HCC) [N17.9] Pancytopenia (HCC) [D61.818] Acute renal failure, unspecified acute renal failure type (HCC) [N17.9] COVID-19 virus infection [U07.1] Patient Active Problem List   Diagnosis Date Noted   Failure to thrive in adult 08/15/2020   Multiple myeloma (HCC) 08/12/2020   Increased anion gap metabolic acidosis 08/12/2020   Acute renal failure superimposed on stage 3b chronic kidney disease (HCC) 08/11/2020   COVID-19 virus infection 08/11/2020   Hypokalemia 08/11/2020   Pancytopenia (HCC) 08/11/2020   Cholecystitis, acute s/p lap chole 04/23/15 04/24/2015   Chest pain 04/20/2015   Chest wall pain 04/20/2015   DM2 (diabetes mellitus, type 2) (HCC) 04/20/2015   Hyperlipidemia 04/20/2015   BPH (benign prostatic hyperplasia) 04/20/2015   Diabetes mellitus with complication (HCC)    Essential hypertension    PCP:  [facility]:     [place], [place] - [address] [place] [address] Phone: [phone] Fax: [phone]     Social Determinants of Health (SDOH) Interventions    Readmission Risk Interventions No flowsheet data found.

## 2020-08-15 NOTE — Progress Notes (Signed)
Pharmacy Antibiotic Note  Jeff Rodriguez is a 75 y.o. male admitted on 08/11/2020 with COVID-19 and AKI. Pharmacy has been consulted today for Meropenem dosing for UTI (MD aware of cephalexin, oxacillin allergies).  Plan: Meropenem 1g IV q24h Monitor renal function, cultures, clinical course  Height: 5\' 8"  (172.7 cm) Weight: 63.1 kg (139 lb 1.8 oz) IBW/kg (Calculated) : 68.4  Temp (24hrs), Avg:98.6 F (37 C), Min:98.3 F (36.8 C), Max:99 F (37.2 C)  Recent Labs  Lab 08/11/20 1550 08/11/20 1627 08/11/20 1627 08/11/20 1750 08/12/20 0338 08/13/20 0400 08/13/20 1506 08/14/20 0328 08/15/20 0417  WBC  --  3.7*  --   --  3.9*  3.8* 2.3*  --  2.8* 1.6*  CREATININE  --  7.27*   < >  --  7.79*  7.64* 7.10* 7.35* 7.23* 7.47*  LATICACIDVEN 1.1  --   --  1.5  --   --   --   --   --    < > = values in this interval not displayed.    Estimated Creatinine Clearance: 7.7 mL/min (A) (by C-G formula based on SCr of 7.47 mg/dL (H)).    Allergies  Allergen Reactions   Shrimp [Shellfish Allergy] Anaphylaxis   Iodine Swelling   Sulfa Antibiotics Other (See Comments)    other   Cephalexin Rash and Hives   Gabapentin Rash   Oxacillin Rash    Antimicrobials this admission: 6/22 Remdesivir >> (6/26) 6/25 Meropenem >>  Dose adjustments this admission: --  Microbiology results: 6/21 BCx: NGTD 6/21 Resp PCR: COVID+, Influenza A/B - 6/24 UCx: sent    Thank you for allowing pharmacy to be a part of this patient's care.  Luiz Ochoa 08/15/2020 10:42 AM

## 2020-08-15 NOTE — Progress Notes (Signed)
Progress Note    Jeff Rodriguez   AJO:[id number]  DOB: 09-05-1945  DOA: 08/11/2020     4  PCP: [facility]  CC: cough, SOB  Hospital Course: Jeff Rodriguez is a 75 y.o. male with medical history significant of multiple myeloma (on [name]), diabetes, hypertension, asthma, hyperlipidemia and osteoarthritis who was brought in by EMS secondary to productive cough, poor oral intake and not eating much.  Patient is currently undergoing chemo for his multiple myeloma.  Patient was a poor historian on admission.  He lives alone with 1 son living locally and another son that calls to check on him consistently.  He is a patient of the [facility] and managed there in regards to his multiple myeloma.  He underwent work-up in the ER and tested positive for COVID-19.  He was also found to be hyponatremic, hypokalemic, and significant renal failure.  He was started on fluids and admitted for further work-up.  Interval History:  Patient appears even weaker this morning.  He still has not eaten much and still does not complain of anything when asked. Discussed CODE STATUS again with him and he is still wishing to be full code but also declined dialysis if needed to nephrology today.  I also called his son Jeff Rodriguez and relayed our conversations as well as gave him an update. Patient's prognosis is very guarded and I am certainly concerned about further quick decline/decompensation.  ROS: Constitutional: negative for fevers, Respiratory: negative for cough and sputum, Cardiovascular: negative for chest pain, and Gastrointestinal: negative for abdominal pain  Assessment & Plan: * Acute renal failure superimposed on stage 3b chronic kidney disease ([place]) - No recent labs to review but renal function in January 2022 was creatinine 1.74, BUN 27, GFR 44 -Presents with creatinine 7.27, BUN 67 -Wide differential for etiology but possibly/hopefully due to prolonged prerenal from poor oral  intake which may have led to an ATN; otherwise could also be related to underlying multiple myeloma (less likely per oncology) or COVID-related - ACEi on hold; renal u/s negative for obstruction - appreciate nephrology involvement as well given his large change in renal fxn - continue IVF for now; stopped bicarb and started LR (acidosis improved) - unfortunately not much renal improvement and he is growing weaker daily due to not eating hardly anything (see separate problem) - he has voiced he would not want HD either - he also wishes to however remain full code and we had a detailed conversation about this and I also called and spoke to Jeff Rodriguez (son) regarding full code for now but should clinical status deteriorate quickly we would have to re-discuss code status again at that time  Failure to thrive in adult - Likely multifactorial but even his son states patient has not been eating well prior to diagnosis of COVID.  He is also still not taking in hardly any nutrition during hospitalization and he continues to grow weaker each day.  I also discussed the possibility of short-term enteral feeding with an NG tube and the patient is currently declining that at this time.  I have also relayed this conversation and decision to his son, Jeff Rodriguez. -I also discussed [place] with the patient bedside daily especially today given that he looks even worse.  Patient wishes to remain full code at this time.  I have also discussed this with Jeff Rodriguez.  We will honor wishes for now and should patient decompensate, I have informed Jeff Rodriguez that we would reach out  to the family during a code to discuss real-time goals of care in that moment.  -Patient's prognosis is very guarded and worsening by the day  Increased anion gap metabolic acidosis - etiology due to renal failure - s/p bicarb drip. Stopped on 6/24 - trend BMP  Pancytopenia ([place]) - presumed from chemo - trend CBC - Hgb down to 6 g/dL on 6/23. No obvious bleeding.  BUN went up some too so will watch this as well - s/p 2 units PRBC, post H/H is 7.8 g/dL -Hold chemical DVT prophylaxis given further downtrend of platelets and some vomiting this morning with subtle coffee-ground appearance  COVID-19 virus infection - main symptoms are lethargy/malaise, poor oral intake. No respiratory sxms - CT value 22 - given hx MM and treatment for it, will go ahead and initiate remdesivir while inpatient - trend inflammatory markers - no indication for steroids at this time  Multiple myeloma (HCC) - on [name] - follows at [facility] - currently [name] on hold per pharmacy - heme/onc consulted on 6/24 due to minimal renal improvement to further weigh in; no monocolonal spike per oncology  Hypokalemia - treated - trend BMP  Essential hypertension - continue current regimen  Diabetes mellitus with complication ([place]) - continue SSI and CBG monitoring   Hyperlipidemia - hold statin for now   Old records reviewed in assessment of this patient  Antimicrobials: Remdesivir 6/22 >> current  DVT prophylaxis: Place and maintain sequential compression device Start: 08/15/20 1140   Code Status:   Code Status: Full Code Family Communication: son  Disposition Plan: Status is: Inpatient  Remains inpatient appropriate because:IV treatments appropriate due to intensity of illness or inability to take PO and Inpatient level of care appropriate due to severity of illness  Dispo: The patient is from: Home              Anticipated d/c is to:  pending PT eval              Patient currently is not medically stable to d/c.   Difficult to place patient No  Risk of unplanned readmission score: Unplanned Admission- Pilot do not use: 24.27   Objective: Blood pressure (!) 94/51, pulse 63, temperature 98.3 F (36.8 C), temperature source Oral, resp. rate 20, height _0  (1.727 m), weight 63.1 kg, SpO2 99 %.  Examination: General appearance:  Elderly gentleman in  no distress but appears very fatigued Head: Normocephalic, without obvious abnormality, atraumatic Eyes:  EOMI Lungs: clear to auscultation bilaterally Heart: regular rate and rhythm and S1, S2 normal Abdomen: normal findings: bowel sounds normal and soft, non-tender Extremities:  No edema Skin:  Diffuse dry skin Neurologic: Grossly normal  Consultants:  Nephrology Oncology  Procedures:    Data Reviewed: I have personally reviewed following labs and imaging studies Results for orders placed or performed during the hospital encounter of 08/11/20 (from the past 24 hour(s))  Glucose, capillary     Status: None   Collection Time: 08/14/20  5:29 PM  Result Value Ref Range   Glucose-Capillary 90 70 - 99 mg/dL  Glucose, capillary     Status: Abnormal   Collection Time: 08/14/20  8:50 PM  Result Value Ref Range   Glucose-Capillary 162 (H) 70 - 99 mg/dL  CBC with Differential/Platelet     Status: Abnormal   Collection Time: 08/15/20  4:17 AM  Result Value Ref Range   WBC 1.6 (L) 4.0 - 10.5 K/uL   RBC 2.56 (L) 4.22 -  5.81 MIL/uL   Hemoglobin 8.2 (L) 13.0 - 17.0 g/dL   HCT 23.2 (L) 39.0 - 52.0 %   MCV 90.6 80.0 - 100.0 fL   MCH 32.0 26.0 - 34.0 pg   MCHC 35.3 30.0 - 36.0 g/dL   RDW 17.7 (H) 11.5 - 15.5 %   Platelets 29 (LL) 150 - 400 K/uL   nRBC 0.0 0.0 - 0.2 %   Neutrophils Relative % 52 %   Band Neutrophils 10 %   Lymphocytes Relative 20 %   Monocytes Relative 16 %   Eosinophils Relative 2 %   Basophils Relative 0 %   WBC Morphology LEUKOPENIA    RBC Morphology NORMAL    Smear Review DONE    nRBC NONE SEEN 0 /100 WBC   Metamyelocytes Relative 0 %   Myelocytes 0 %   Promyelocytes Relative 0 %   Blasts 1 %  Comprehensive metabolic panel     Status: Abnormal   Collection Time: 08/15/20  4:17 AM  Result Value Ref Range   Sodium 129 (L) 135 - 145 mmol/L   Potassium 2.9 (L) 3.5 - 5.1 mmol/L   Chloride 89 (L) 98 - 111 mmol/L   CO2 26 22 - 32 mmol/L   Glucose, Bld 111 (H)  70 - 99 mg/dL   BUN 86 (H) 8 - 23 mg/dL   Creatinine, Ser 7.47 (H) 0.61 - 1.24 mg/dL   Calcium 5.9 (LL) 8.9 - 10.3 mg/dL   Total Protein 4.9 (L) 6.5 - 8.1 g/dL   Albumin 2.0 (L) 3.5 - 5.0 g/dL   AST 25 15 - 41 U/L   ALT 22 0 - 44 U/L   Alkaline Phosphatase 39 38 - 126 U/L   Total Bilirubin 0.5 0.3 - 1.2 mg/dL   GFR, Estimated 7 (L) >60 mL/min   Anion gap 14 5 - 15  C-reactive protein     Status: Abnormal   Collection Time: 08/15/20  4:17 AM  Result Value Ref Range   CRP 21.2 (H) <1.0 mg/dL  D-dimer, quantitative     Status: Abnormal   Collection Time: 08/15/20  4:17 AM  Result Value Ref Range   D-Dimer, Quant 3.49 (H) 0.00 - 0.50 ug/mL-FEU  Ferritin     Status: Abnormal   Collection Time: 08/15/20  4:17 AM  Result Value Ref Range   Ferritin 970 (H) 24 - 336 ng/mL  Magnesium     Status: Abnormal   Collection Time: 08/15/20  4:17 AM  Result Value Ref Range   Magnesium 1.6 (L) 1.7 - 2.4 mg/dL  Phosphorus     Status: None   Collection Time: 08/15/20  4:17 AM  Result Value Ref Range   Phosphorus 4.4 2.5 - 4.6 mg/dL  Procalcitonin - Baseline     Status: None   Collection Time: 08/15/20  4:17 AM  Result Value Ref Range   Procalcitonin 1.12 ng/mL  Glucose, capillary     Status: Abnormal   Collection Time: 08/15/20  7:49 AM  Result Value Ref Range   Glucose-Capillary 135 (H) 70 - 99 mg/dL  Glucose, capillary     Status: Abnormal   Collection Time: 08/15/20 11:54 AM  Result Value Ref Range   Glucose-Capillary 147 (H) 70 - 99 mg/dL    Recent Results (from the past 240 hour(s))  Resp Panel by RT-PCR (Flu A&B, Covid) Nasopharyngeal Swab     Status: Abnormal   Collection Time: 08/11/20  3:50 PM   Specimen: Nasopharyngeal Swab; Nasopharyngeal(NP)  swabs in vial transport medium  Result Value Ref Range Status   SARS Coronavirus 2 by RT PCR POSITIVE (A) NEGATIVE Final    Comment: RESULT CALLED TO, READ BACK BY AND VERIFIED WITH: M.BELFI AT 1949 ON 06.21.22 BY  N.THOMPSON (NOTE) SARS-CoV-2 target nucleic acids are DETECTED.  The SARS-CoV-2 RNA is generally detectable in upper respiratory specimens during the acute phase of infection. Positive results are indicative of the presence of the identified virus, but do not rule out bacterial infection or co-infection with other pathogens not detected by the test. Clinical correlation with patient history and other diagnostic information is necessary to determine patient infection status. The expected result is Negative.  Fact Sheet for Patients: EntrepreneurPulse.com.au  Fact Sheet for Healthcare Providers: IncredibleEmployment.be  This test is not yet approved or cleared by the [place] FDA and  has been authorized for detection and/or diagnosis of SARS-CoV-2 by FDA under an Emergency Use Authorization (EUA).  This EUA will remain in effect (meaning this tes t can be used) for the duration of  the COVID-19 declaration under Section 564(b)(1) of the Act, 21 U.S.C. section 360bbb-3(b)(1), unless the authorization is terminated or revoked sooner.     Influenza A by PCR NEGATIVE NEGATIVE Final   Influenza B by PCR NEGATIVE NEGATIVE Final    Comment: (NOTE) The Xpert Xpress SARS-CoV-2/FLU/RSV plus assay is intended as an aid in the diagnosis of influenza from Nasopharyngeal swab specimens and should not be used as a sole basis for treatment. Nasal washings and aspirates are unacceptable for Xpert Xpress SARS-CoV-2/FLU/RSV testing.  Fact Sheet for Patients: EntrepreneurPulse.com.au  Fact Sheet for Healthcare Providers: IncredibleEmployment.be  This test is not yet approved or cleared by the [place] FDA and has been authorized for detection and/or diagnosis of SARS-CoV-2 by FDA under an Emergency Use Authorization (EUA). This EUA will remain in effect (meaning this test can be used) for the duration of  the COVID-19 declaration under Section 564(b)(1) of the Act, 21 U.S.C. section 360bbb-3(b)(1), unless the authorization is terminated or revoked.  Performed at [facility], [place] [address] [place], [place] [address]   Blood Culture (routine x 2)     Status: None (Preliminary result)   Collection Time: 08/11/20  3:50 PM   Specimen: BLOOD  Result Value Ref Range Status   Specimen Description   Final    BLOOD BLOOD RIGHT FOREARM Performed at [facility] [address], [place], [place] [address]    Special Requests   Final    BOTTLES DRAWN AEROBIC AND ANAEROBIC Blood Culture adequate volume Performed at [facility] [address], [place], [place] [address]    Culture   Final    NO GROWTH 4 DAYS Performed at [facility], [place] [address], [place], [place] [address]    Report Status PENDING  Incomplete  Blood Culture (routine x 2)     Status: None (Preliminary result)   Collection Time: 08/11/20  3:55 PM   Specimen: BLOOD  Result Value Ref Range Status   Specimen Description   Final    BLOOD BLOOD LEFT FOREARM Performed at [facility] [address], [place], [place] [address]    Special Requests   Final    BOTTLES DRAWN AEROBIC AND ANAEROBIC Blood Culture adequate volume Performed at [place] [address] [place], [place] [address]    Culture   Final    NO GROWTH 4 DAYS  Performed at [facility], [place] [address], [place], [place] [address]    Report Status PENDING  Incomplete     Radiology Studies: No results found. US RENAL  Final Result    DG Chest Port 1 View  Final Result      Scheduled Meds:  (feeding supplement) PROSource Plus  30 mL Oral BID BM   sodium chloride   Intravenous Once   feeding supplement (NEPRO CARB STEADY)  237 mL Oral BID BM   finasteride  5 mg Oral Daily   insulin aspart  0-20 Units Subcutaneous TID WC   insulin aspart   0-5 Units Subcutaneous QHS   lidocaine  1 patch Transdermal Q24H   potassium chloride  40 mEq Oral BID   tamsulosin  0.4 mg Oral QHS   PRN Meds: acetaminophen, albuterol, chlorpheniramine-HYDROcodone, guaiFENesin-dextromethorphan, ondansetron **OR** ondansetron (ZOFRAN) IV, promethazine (PHENERGAN) injection (IM or IVPB) Continuous Infusions:  lactated ringers 75 mL/hr at 08/15/20 0229   magnesium sulfate bolus IVPB     [START ON 08/16/2020] meropenem (MERREM) IV     promethazine (PHENERGAN) injection (IM or IVPB)     remdesivir 100 mg in NS 100 mL 100 mg (08/15/20 0849)     LOS: 4 days  Time spent: Greater than 50% of the 35 minute visit was spent in counseling/coordination of care for the patient as laid out in the A&P.   Dwyane Dee, MD [facility] 08/15/2020, 1:11 PM

## 2020-08-16 DIAGNOSIS — U071 COVID-19: Secondary | ICD-10-CM | POA: Diagnosis not present

## 2020-08-16 DIAGNOSIS — N1832 Chronic kidney disease, stage 3b: Secondary | ICD-10-CM | POA: Diagnosis not present

## 2020-08-16 DIAGNOSIS — N179 Acute kidney failure, unspecified: Secondary | ICD-10-CM | POA: Diagnosis not present

## 2020-08-16 DIAGNOSIS — R627 Adult failure to thrive: Secondary | ICD-10-CM | POA: Diagnosis not present

## 2020-08-16 LAB — CULTURE, BLOOD (ROUTINE X 2)
Culture: NO GROWTH
Culture: NO GROWTH
Special Requests: ADEQUATE
Special Requests: ADEQUATE

## 2020-08-16 LAB — COMPREHENSIVE METABOLIC PANEL
ALT: 19 U/L (ref 0–44)
AST: 20 U/L (ref 15–41)
Albumin: 1.9 g/dL — ABNORMAL LOW (ref 3.5–5.0)
Alkaline Phosphatase: 38 U/L (ref 38–126)
Anion gap: 15 (ref 5–15)
BUN: 95 mg/dL — ABNORMAL HIGH (ref 8–23)
CO2: 25 mmol/L (ref 22–32)
Calcium: 6.1 mg/dL — CL (ref 8.9–10.3)
Chloride: 89 mmol/L — ABNORMAL LOW (ref 98–111)
Creatinine, Ser: 7.21 mg/dL — ABNORMAL HIGH (ref 0.61–1.24)
GFR, Estimated: 7 mL/min — ABNORMAL LOW (ref >60)
Glucose, Bld: 127 mg/dL — ABNORMAL HIGH (ref 70–99)
Potassium: 3 mmol/L — ABNORMAL LOW (ref 3.5–5.1)
Sodium: 129 mmol/L — ABNORMAL LOW (ref 135–145)
Total Bilirubin: 0.5 mg/dL (ref 0.3–1.2)
Total Protein: 4.9 g/dL — ABNORMAL LOW (ref 6.5–8.1)

## 2020-08-16 LAB — D-DIMER, QUANTITATIVE: D-Dimer, Quant: 4.59 ug/mL-FEU — ABNORMAL HIGH (ref 0.00–0.50)

## 2020-08-16 LAB — PHOSPHORUS: Phosphorus: 4 mg/dL (ref 2.5–4.6)

## 2020-08-16 LAB — MAGNESIUM: Magnesium: 1.9 mg/dL (ref 1.7–2.4)

## 2020-08-16 LAB — IGG, IGA, IGM
IgA: 192 mg/dL (ref 61–437)
IgG (Immunoglobin G), Serum: 529 mg/dL — ABNORMAL LOW (ref 603–1613)
IgM (Immunoglobulin M), Srm: 27 mg/dL (ref 15–143)

## 2020-08-16 LAB — GLUCOSE, CAPILLARY
Glucose-Capillary: 130 mg/dL — ABNORMAL HIGH (ref 70–99)
Glucose-Capillary: 154 mg/dL — ABNORMAL HIGH (ref 70–99)
Glucose-Capillary: 191 mg/dL — ABNORMAL HIGH (ref 70–99)
Glucose-Capillary: 202 mg/dL — ABNORMAL HIGH (ref 70–99)

## 2020-08-16 LAB — ERYTHROPOIETIN: Erythropoietin: 18.2 m[IU]/mL (ref 2.6–18.5)

## 2020-08-16 LAB — FERRITIN: Ferritin: 820 ng/mL — ABNORMAL HIGH (ref 24–336)

## 2020-08-16 LAB — PROCALCITONIN: Procalcitonin: 1.03 ng/mL

## 2020-08-16 LAB — C-REACTIVE PROTEIN: CRP: 18.1 mg/dL — ABNORMAL HIGH (ref <1.0)

## 2020-08-16 MED ORDER — POTASSIUM CHLORIDE 20 MEQ PO PACK
40.0000 meq | PACK | Freq: Once | ORAL | Status: AC
  Administered 2020-08-16: 40 meq via ORAL
  Filled 2020-08-16: qty 2

## 2020-08-16 MED ORDER — PHENOL 1.4 % MT LIQD
1.0000 | OROMUCOSAL | Status: DC | PRN
  Administered 2020-08-16: 1 via OROMUCOSAL

## 2020-08-16 MED ORDER — POTASSIUM CHLORIDE 10 MEQ/100ML IV SOLN
10.0000 meq | INTRAVENOUS | Status: AC
  Administered 2020-08-16 (×4): 10 meq via INTRAVENOUS
  Filled 2020-08-16 (×4): qty 100

## 2020-08-16 MED ORDER — MAGNESIUM SULFATE 2 GM/50ML IV SOLN
2.0000 g | Freq: Once | INTRAVENOUS | Status: AC
  Administered 2020-08-16: 2 g via INTRAVENOUS
  Filled 2020-08-16: qty 50

## 2020-08-16 NOTE — TOC Progression Note (Signed)
Transition of Care [facility]) - Progression Note    Patient Details  Name: Jeff Rodriguez MRN: [id number] Date of Birth: 03-14-1945  [facility] of [facility] CM/SW Contact  Kinnick Maus, Juliann Pulse, RN Phone Number: 08/16/2020, 2:36 PM  Clinical Narrative: Noted no bed offers yet.      Expected Discharge Plan: Skilled Nursing Facility Barriers to Discharge: SNF Pending bed offer  Expected Discharge Plan and Services Expected Discharge Plan: [place]   Discharge Planning Services: CM Consult Post Acute Care Choice: [place] Living arrangements for the past 2 months: Single Family Home                                       Social Determinants of Health (SDOH) Interventions    Readmission Risk Interventions No flowsheet data found.

## 2020-08-16 NOTE — Plan of Care (Signed)

## 2020-08-16 NOTE — Evaluation (Signed)
Clinical/Bedside Swallow Evaluation Patient Details  Name: Jeff Rodriguez MRN: [id number] Date of Birth: January 21, 1946  Today's Date: 08/16/2020 Time: SLP Start Time (ACUTE ONLY): 31 SLP Stop Time (ACUTE ONLY): 1650 SLP Time Calculation (min) (ACUTE ONLY): 30 min  Past Medical History:  Past Medical History:  Diagnosis Date   Asthma    Diabetes mellitus    Hypertension    Past Surgical History:  Past Surgical History:  Procedure Laterality Date   CHOLECYSTECTOMY N/A 04/23/2015   Procedure: LAPAROSCOPIC CHOLECYSTECTOMY WITH INTRAOPERATIVE CHOLANGIOGRAM;  Surgeon: Ralene Ok, MD;  Location: [facility];  Service: General;  Laterality: N/A;   REPLACEMENT TOTAL KNEE Bilateral    TOTAL HIP ARTHROPLASTY Right    HPI:  75 y.o. male with medical history significant of multiple myeloma, diabetes, hypertension, diabetes, asthma, hyperlipidemia and osteoarthritis who was brought in by EMS on 08/11/20 secondary to productive cough and poor oral intake. Patient is currently undergoing chemo for his multiple myeloma.  His son actually called EMS.  Patient came to the ER and was not a good historian.  He reports his 4 to 5-day of the cough and congestion.  No fevers no vomiting.  Patient has had some loose stools.  Denies shortness of breath but just weak and poor appetite.  Patient reported recent exposure to both flu and the COVID virus; pt tested positive for COVID-19 and has experienced severe odynophagia since admission.  BSE generated.  Assessment / Plan / Recommendation Clinical Impression  Pt seen for clinical swallowing evaluation with odynophagia primary symptom associated with oropharyngeal dysphagia.  Pt exhibited a hoarse, breathy low intensity vocal quality throughout the evaluation and winced with all consistencies assessed stating "it hurts when I swallow anything."  He was assessed with ice chips, thin via straw, puree and italian ice to assess mastication as solids were too painful to  swallow.  Oral holding associated with anticipation of swallow vs true functional swallow issue.  Xerostomia present likely d/t limited PO intake and odynophagia.  May consider r/o other etiologies for odynophagia to assist with increasing satiety/intake.  OME revealed dry oral cavity, but adequate oral motor function.  ST will f/u for potential compensatory strategies to assist with increasing oral intake/diet tolerance while in acute setting x1.  Thank you for this consult.  SLP Visit Diagnosis: Dysphagia, unspecified (R13.10)    Aspiration Risk  Mild aspiration risk    Diet Recommendation   Regular/thin liquids (as tolerated)  Medication Administration: Whole meds with liquid (or with puree (whichever provides more relief from odynophagia))    Other  Recommendations Oral Care Recommendations: Oral care BID   Follow up Recommendations    TBD    Frequency and Duration min 1 x/week  1 week       Prognosis Prognosis for Safe Diet Advancement: Good Barriers to Reach Goals: Other (Comment) (odynophagia)      Swallow Study   General Date of Onset: 08/11/20 HPI: 75 y.o. male with medical history significant of multiple myeloma, diabetes, hypertension, diabetes, asthma, hyperlipidemia and osteoarthritis who was brought in by EMS secondary to productive cough, poor oral intake and not eating much.  Patient is currently undergoing chemo for his multiple myeloma.  His son actually called EMS.  Patient came to the ER and was not a good historian.  He reports his 4 to 5-day of the cough and congestion.  No fevers no vomiting.  Patient has had some loose stools.  Denies shortness of breath but just weak and poor  appetite.  Patient reported recent exposure to both flu and the COVID virus. Type of Study: Bedside Swallow Evaluation Previous Swallow Assessment: n/a Diet Prior to this Study: Regular;Thin liquids Temperature Spikes Noted: No Respiratory Status: Room air History of Recent Intubation:  No Behavior/Cognition: Alert;Cooperative;Distractible Oral Cavity Assessment: Dry Oral Care Completed by SLP: Other (Comment) (recent c/o oral care by pt) Oral Cavity - Dentition: Adequate natural dentition;Dentures, top Vision: Functional for self-feeding Self-Feeding Abilities: Able to feed self Patient Positioning: Upright in bed Baseline Vocal Quality: Breathy;Hoarse;Low vocal intensity Volitional Cough: Strong;Other (Comment) (odynophagia) Volitional Swallow: Unable to elicit (d/t pain)    Oral/Motor/Sensory Function Overall Oral Motor/Sensory Function: Within functional limits   Ice Chips Ice chips: Within functional limits Presentation: Spoon Other Comments: odynophagia   Thin Liquid Thin Liquid: Within functional limits Presentation: Straw Other Comments: odynophagia    Nectar Thick Nectar Thick Liquid: Not tested   Honey Thick Honey Thick Liquid: Not tested   Puree Puree: Within functional limits Presentation: Spoon Other Comments: odynophagia   Solid     Solid: Not tested Other Comments: Pt refused d/t pain associated with swallowing (odynophagia)      Elvina Sidle, M.S., CCC-SLP 08/16/2020,6:06 PM

## 2020-08-16 NOTE — Progress Notes (Signed)
MD notified that Speech recommended either magic mouthwash, chloraseptic or nystatin for swallowing pain. Patient's swallowing functionality is good, he just has throat pain and is slightly red. Speech also recommended a possible strep test. MD made aware of all recommendations.

## 2020-08-16 NOTE — Progress Notes (Signed)
Patient pulled condom cath off, bed soaked with urine, patient also had incontinent episode of liquid stool.  Pt washed up linens changed and new condom cath put on.  Pt rolled to right side with out incident but when rolled back over to left he stated he felt he might be sick, and was belching a lot.  Zofran and malox given

## 2020-08-16 NOTE — Progress Notes (Signed)
Progress Note    Jeff Rodriguez   MCN:[id number]  DOB: 1945-06-30  DOA: 08/11/2020     5  PCP: [facility], [facility]  CC: cough, SOB  Hospital Course: Jeff Rodriguez is a 75 y.o. male with medical history significant of multiple myeloma (on [name]), diabetes, hypertension, asthma, hyperlipidemia and osteoarthritis who was brought in by EMS secondary to productive cough, poor oral intake and not eating much.  Patient is currently undergoing chemo for his multiple myeloma.  Patient was a poor historian on admission.  He lives alone with 1 son living locally and another son that calls to check on him consistently.  He is a patient of the [facility] and managed there in regards to his multiple myeloma.  He underwent work-up in the ER and tested positive for COVID-19.  He was also found to be hyponatremic, hypokalemic, and significant renal failure.  He was started on fluids and admitted for further work-up.  Interval History:  No events overnight but patient appears even worse this morning.  Barely able to open his eyes enough to talk to me much.  He is not overtly confused, and has no nausea or vomiting but has still not wished to eat anything once again since yesterday. Again we discussed his CODE STATUS and he is still wishing to remain full code.  He is now on the fence about dialysis after our conversation (yesterday he was against doing it and today undecided).  ROS: Constitutional: positive for anorexia, fatigue, and malaise, negative for chills and fevers, Respiratory: negative for cough and sputum, Cardiovascular: negative for chest pain, and Gastrointestinal: negative for abdominal pain  Assessment & Plan: * Acute renal failure superimposed on stage 3b chronic kidney disease (HCC) - No recent labs to review but renal function in January 2022 was creatinine 1.74, BUN 27, GFR 44 -Presents with creatinine 7.27, BUN 67 -Wide differential for etiology but  possibly/hopefully due to prolonged prerenal from poor oral intake which may have led to an ATN; otherwise could also be related to underlying multiple myeloma (less likely per oncology) or COVID-related - ACEi on hold; renal u/s negative for obstruction - appreciate nephrology involvement as well given his large change in renal fxn - continue IVF for now; stopped bicarb and started LR (acidosis improved) - unfortunately not much renal improvement and he is growing weaker daily due to not eating hardly anything (see separate problem); he has no uremic signs yet but BUN continues to climb daily - he is now on the fence about HD; I tried discussing DNR when he was declining HD but now after our discussion he's undecided on HD b/c I told him if declining HD and it was needed then this would not align with his code status and goals of care - given worsening decline and need for ongoing [place] discussions, I will ask if Palliative care can help continue conversations with family and patient  Failure to thrive in adult - Likely multifactorial but even his son states patient has not been eating well prior to diagnosis of COVID.  He is also still not taking in hardly any nutrition during hospitalization and he continues to grow weaker each day.  I also discussed the possibility of short-term enteral feeding with an NG tube and the patient is currently declining that at this time.  I have also relayed this conversation and decision to his son, Jeff Rodriguez. -I also discussed [place] with the patient bedside daily. He again is weaker  today compared to yesterday but no obvious confusion or uremic signs yet.  - He still wishes to remain full code esp after discussing possibility of HD -Patient's prognosis is very guarded and worsening by the day - see discussion above too. Will consult Palliative care to help continue conversations. Very concerned about his progressive decline and resuscitative efforts might prove to be  futile   Pancytopenia ([place]) - presumed from chemo - trend CBC - Hgb down to 6 g/dL on 6/23. No obvious bleeding. BUN went up some too so will watch this as well - s/p 2 units PRBC, post H/H is 7.8 g/dL -Hold chemical DVT prophylaxis given further downtrend of platelets and some vomiting this morning with subtle coffee-ground appearance  COVID-19 virus infection - main symptoms are lethargy/malaise, poor oral intake. No respiratory sxms - CT value 22 - given hx MM and treatment for it, will go ahead and initiate remdesivir while inpatient - trend inflammatory markers - no indication for steroids at this time; has remained on RA  Increased anion gap metabolic acidosis-resolved as of 08/16/2020 - etiology due to renal failure - s/p bicarb drip. Stopped on 6/24 - trend BMP  Multiple myeloma ([place]) - on [name] - follows at [facility] - currently [name] on hold per pharmacy - heme/onc consulted on 6/24 due to minimal renal improvement to further weigh in; no monocolonal spike per oncology  Hypokalemia - treated - trend BMP  Essential hypertension - continue current regimen  Diabetes mellitus with complication ([place]) - continue SSI and CBG monitoring   Hyperlipidemia - hold statin for now   Old records reviewed in assessment of this patient  Antimicrobials: Remdesivir 6/22 >> current  DVT prophylaxis: Place and maintain sequential compression device Start: 08/15/20 1140   Code Status:   Code Status: Full Code Family Communication: son  Disposition Plan: Status is: Inpatient  Remains inpatient appropriate because:IV treatments appropriate due to intensity of illness or inability to take PO and Inpatient level of care appropriate due to severity of illness  Dispo: The patient is from: Home              Anticipated d/c is to:  pending clinical course; prognosis guarded              Patient currently is not medically stable to d/c.   Difficult to place patient  No  Risk of unplanned readmission score: Unplanned Admission- Pilot do not use: 24.83   Objective: Blood pressure 117/64, pulse 87, temperature 97.9 F (36.6 C), temperature source Axillary, resp. rate 20, height _0  (1.727 m), weight 63.1 kg, SpO2 94 %.  Examination: General appearance:  Elderly gentleman even more fatigued barely able to open his eyes and laying in bed in no distress Head: Normocephalic, without obvious abnormality, atraumatic Eyes:  EOMI Lungs: clear to auscultation bilaterally Heart: regular rate and rhythm and S1, S2 normal Abdomen: normal findings: bowel sounds normal and soft, non-tender Extremities:  No edema Skin:  Diffuse dry skin Neurologic: No focal deficits  Consultants:  Nephrology Oncology Palliative care medicine  Procedures:    Data Reviewed: I have personally reviewed following labs and imaging studies Results for orders placed or performed during the hospital encounter of 08/11/20 (from the past 24 hour(s))  Glucose, capillary     Status: Abnormal   Collection Time: 08/15/20  4:50 PM  Result Value Ref Range   Glucose-Capillary 175 (H) 70 - 99 mg/dL  Glucose, capillary     Status: Abnormal  Collection Time: 08/15/20  9:43 PM  Result Value Ref Range   Glucose-Capillary 132 (H) 70 - 99 mg/dL  CBC with Differential/Platelet     Status: Abnormal   Collection Time: 08/16/20  3:26 AM  Result Value Ref Range   WBC 1.1 (LL) 4.0 - 10.5 K/uL   RBC 2.48 (L) 4.22 - 5.81 MIL/uL   Hemoglobin 7.9 (L) 13.0 - 17.0 g/dL   HCT 22.8 (L) 39.0 - 52.0 %   MCV 91.9 80.0 - 100.0 fL   MCH 31.9 26.0 - 34.0 pg   MCHC 34.6 30.0 - 36.0 g/dL   RDW 17.5 (H) 11.5 - 15.5 %   Platelets 28 (LL) 150 - 400 K/uL   nRBC 0.0 0.0 - 0.2 %   Neutrophils Relative % 39 %   Neutro Abs 0.4 (LL) 1.7 - 7.7 K/uL   Lymphocytes Relative 39 %   Lymphs Abs 0.4 (L) 0.7 - 4.0 K/uL   Monocytes Relative 13 %   Monocytes Absolute 0.1 0.1 - 1.0 K/uL   Eosinophils Relative 3 %    Eosinophils Absolute 0.0 0.0 - 0.5 K/uL   Basophils Relative 0 %   Basophils Absolute 0.0 0.0 - 0.1 K/uL   Immature Granulocytes 6 %   Abs Immature Granulocytes 0.06 0.00 - 0.07 K/uL  Comprehensive metabolic panel     Status: Abnormal   Collection Time: 08/16/20  3:26 AM  Result Value Ref Range   Sodium 129 (L) 135 - 145 mmol/L   Potassium 3.0 (L) 3.5 - 5.1 mmol/L   Chloride 89 (L) 98 - 111 mmol/L   CO2 25 22 - 32 mmol/L   Glucose, Bld 127 (H) 70 - 99 mg/dL   BUN 95 (H) 8 - 23 mg/dL   Creatinine, Ser 7.21 (H) 0.61 - 1.24 mg/dL   Calcium 6.1 (LL) 8.9 - 10.3 mg/dL   Total Protein 4.9 (L) 6.5 - 8.1 g/dL   Albumin 1.9 (L) 3.5 - 5.0 g/dL   AST 20 15 - 41 U/L   ALT 19 0 - 44 U/L   Alkaline Phosphatase 38 38 - 126 U/L   Total Bilirubin 0.5 0.3 - 1.2 mg/dL   GFR, Estimated 7 (L) >60 mL/min   Anion gap 15 5 - 15  C-reactive protein     Status: Abnormal   Collection Time: 08/16/20  3:26 AM  Result Value Ref Range   CRP 18.1 (H) <1.0 mg/dL  D-dimer, quantitative     Status: Abnormal   Collection Time: 08/16/20  3:26 AM  Result Value Ref Range   D-Dimer, Quant 4.59 (H) 0.00 - 0.50 ug/mL-FEU  Ferritin     Status: Abnormal   Collection Time: 08/16/20  3:26 AM  Result Value Ref Range   Ferritin 820 (H) 24 - 336 ng/mL  Magnesium     Status: None   Collection Time: 08/16/20  3:26 AM  Result Value Ref Range   Magnesium 1.9 1.7 - 2.4 mg/dL  Phosphorus     Status: None   Collection Time: 08/16/20  3:26 AM  Result Value Ref Range   Phosphorus 4.0 2.5 - 4.6 mg/dL  Procalcitonin     Status: None   Collection Time: 08/16/20  3:26 AM  Result Value Ref Range   Procalcitonin 1.03 ng/mL  Glucose, capillary     Status: Abnormal   Collection Time: 08/16/20  7:41 AM  Result Value Ref Range   Glucose-Capillary 130 (H) 70 - 99 mg/dL  Glucose, capillary  Status: Abnormal   Collection Time: 08/16/20 11:59 AM  Result Value Ref Range   Glucose-Capillary 154 (H) 70 - 99 mg/dL    Recent Results  (from the past 240 hour(s))  Resp Panel by RT-PCR (Flu A&B, Covid) Nasopharyngeal Swab     Status: Abnormal   Collection Time: 08/11/20  3:50 PM   Specimen: Nasopharyngeal Swab; Nasopharyngeal(NP) swabs in vial transport medium  Result Value Ref Range Status   SARS Coronavirus 2 by RT PCR POSITIVE (A) NEGATIVE Final    Comment: RESULT CALLED TO, READ BACK BY AND VERIFIED WITH: M.BELFI AT 1949 ON 06.21.22 BY N.THOMPSON (NOTE) SARS-CoV-2 target nucleic acids are DETECTED.  The SARS-CoV-2 RNA is generally detectable in upper respiratory specimens during the acute phase of infection. Positive results are indicative of the presence of the identified virus, but do not rule out bacterial infection or co-infection with other pathogens not detected by the test. Clinical correlation with patient history and other diagnostic information is necessary to determine patient infection status. The expected result is Negative.  Fact Sheet for Patients: EntrepreneurPulse.com.au  Fact Sheet for Healthcare Providers: IncredibleEmployment.be  This test is not yet approved or cleared by the [place] FDA and  has been authorized for detection and/or diagnosis of SARS-CoV-2 by FDA under an Emergency Use Authorization (EUA).  This EUA will remain in effect (meaning this tes t can be used) for the duration of  the COVID-19 declaration under Section 564(b)(1) of the Act, 21 U.S.C. section 360bbb-3(b)(1), unless the authorization is terminated or revoked sooner.     Influenza A by PCR NEGATIVE NEGATIVE Final   Influenza B by PCR NEGATIVE NEGATIVE Final    Comment: (NOTE) The Xpert Xpress SARS-CoV-2/FLU/RSV plus assay is intended as an aid in the diagnosis of influenza from Nasopharyngeal swab specimens and should not be used as a sole basis for treatment. Nasal washings and aspirates are unacceptable for Xpert Xpress SARS-CoV-2/FLU/RSV testing.  Fact Sheet for  Patients: EntrepreneurPulse.com.au  Fact Sheet for Healthcare Providers: IncredibleEmployment.be  This test is not yet approved or cleared by the [place] FDA and has been authorized for detection and/or diagnosis of SARS-CoV-2 by FDA under an Emergency Use Authorization (EUA). This EUA will remain in effect (meaning this test can be used) for the duration of the COVID-19 declaration under Section 564(b)(1) of the Act, 21 U.S.C. section 360bbb-3(b)(1), unless the authorization is terminated or revoked.  Performed at [facility], [place] [address] [place], [place] [address]   Blood Culture (routine x 2)     Status: None   Collection Time: 08/11/20  3:50 PM   Specimen: BLOOD  Result Value Ref Range Status   Specimen Description   Final    BLOOD BLOOD RIGHT FOREARM Performed at [facility] [address], [place], [place] [address]    Special Requests   Final    BOTTLES DRAWN AEROBIC AND ANAEROBIC Blood Culture adequate volume Performed at [facility] [address], [place], [place] [address]    Culture   Final    NO GROWTH 5 DAYS Performed at [facility], [place] [address] [place], [place] [address]    Report Status 08/16/2020 FINAL  Final  Blood Culture (routine x 2)     Status: None   Collection Time: 08/11/20  3:55 PM   Specimen: BLOOD  Result Value Ref Range Status   Specimen Description   Final    BLOOD BLOOD LEFT FOREARM  Performed at [facility], [place] [address] [place], [place] [address]    Special Requests   Final    BOTTLES DRAWN AEROBIC AND ANAEROBIC Blood Culture adequate volume Performed at [facility] [address], [place], [place] [address]    Culture   Final    NO GROWTH 5 DAYS Performed at [facility], [place] [address], [place], [place] [address]    Report Status 08/16/2020 FINAL  Final  Culture,  Urine     Status: Abnormal   Collection Time: 08/14/20  3:11 PM   Specimen: Urine, Random  Result Value Ref Range Status   Specimen Description   Final    URINE, RANDOM Performed at [facility] [address], [place], [place] [address]    Special Requests   Final    NONE Performed at [facility], [place] [address], [place], [place] [address]    Culture MULTIPLE SPECIES PRESENT, SUGGEST RECOLLECTION (A)  Final   Report Status 08/15/2020 FINAL  Final     Radiology Studies: No results found. US RENAL  Final Result    DG Chest Port 1 View  Final Result      Scheduled Meds:  (feeding supplement) PROSource Plus  30 mL Oral BID BM   sodium chloride   Intravenous Once   feeding supplement (NEPRO CARB STEADY)  237 mL Oral BID BM   finasteride  5 mg Oral Daily   insulin aspart  0-20 Units Subcutaneous TID WC   insulin aspart  0-5 Units Subcutaneous QHS   lidocaine  1 patch Transdermal Q24H   tamsulosin  0.4 mg Oral QHS   PRN Meds: acetaminophen, albuterol, alum & mag hydroxide-simeth, chlorpheniramine-HYDROcodone, guaiFENesin-dextromethorphan, ondansetron **OR** ondansetron (ZOFRAN) IV, promethazine (PHENERGAN) injection (IM or IVPB) Continuous Infusions:  lactated ringers 75 mL/hr at 08/15/20 0229   meropenem (MERREM) IV 1 g (08/16/20 1102)   potassium chloride 10 mEq (08/16/20 1225)   promethazine (PHENERGAN) injection (IM or IVPB)       LOS: 5 days  Time spent: Greater than 50% of the 35 minute visit was spent in counseling/coordination of care for the patient as laid out in the A&P.   Dwyane Dee, MD [facility] 08/16/2020, 1:28 PM

## 2020-08-16 NOTE — Progress Notes (Signed)
Subjective:  At least 300 of UOP recorded, incontinence noted - BUN and crt roughly the same -  he is alert but he feels rough-  family - son here at bedside-  tells me that pt has been declining of late but does not know a lot of details regarding recent renal function -  no real change today but no throwing up   Objective Vital signs in last 24 hours: Vitals:   08/15/20 0553 08/15/20 0840 08/15/20 1100 08/15/20 2150  BP: (!) 97/54 (!) 101/52 (!) 94/51 (!) 93/56  Pulse: 69 63  72  Resp:  $Remo'18 20 18  'lyvFC$ Temp: 98.3 F (36.8 C) 98.3 F (36.8 C) 98.3 F (36.8 C) 99 F (37.2 C)  TempSrc: Oral Oral Oral Oral  SpO2: 99% 99% 99% 94%  Weight:      Height:       Weight change:   Intake/Output Summary (Last 24 hours) at 08/16/2020 1053 Last data filed at 08/16/2020 0600 Gross per 24 hour  Intake 2054.53 ml  Output --  Net 2054.53 ml    Assessment/ Plan: Pt is a 75 y.o. yo male with multiple myeloma who was admitted on 08/11/2020 with COVID-  A on CRT-  crt 1.7 in Jan 2022-  now 7- non oliguric  Assessment/Plan: 1. A on CRF-  possibly taking ACE-I prior to admit-  renal u/s neg for obstruction- urine shows 30 of prot-  6-10 RBC- pyuria but diff to interpret out of bag-  will order culture-  mult species-  urine does not seem obviously infected by visualization- UOP improving.  AKI Could be ATN from hypotension on ACE.    myeloma kidney is also in the differential-  dont have details about his status with that-  just that he is on revlimid-  he tells me for a long time.  No dialysis indications at this time, but he clearly is not doing well, failing to thrive-  could be uremic.  I think we are doing what we can-  trying to set the stage for renal improvement.  Found out that patient helped his wife do home hemo for may years prior to her death.  He told me yesterday that he would not do dialysis and understands the consequences of not doing it-  today he is hedging on that decision maybe -  no absolute  ind but clearly not thriving  2. HTN/volume-  not wet-  getting LR at 75 per hour-  to continue  3. Anemia-  blood transfusion 6/23-  due to myeloma and also AKI-  stable to improved 4. Hypocalcemia-  corrected is around 7.7-  no sxms - no action needed -  got bolus during hosp 5. Hyponatremia-  waxing and waning 6. Hypokalemia-  replete PRN-  will give again today     Louis Meckel    Labs: Basic Metabolic Panel: Recent Labs  Lab 08/14/20 0328 08/15/20 0417 08/16/20 0326  NA 132* 129* 129*  K 3.5 2.9* 3.0*  CL 93* 89* 89*  CO2 $Re'23 26 25  'LQy$ GLUCOSE 161* 111* 127*  BUN 83* 86* 95*  CREATININE 7.23* 7.47* 7.21*  CALCIUM 6.5* 5.9* 6.1*  PHOS 4.6 4.4 4.0   Liver Function Tests: Recent Labs  Lab 08/14/20 0328 08/15/20 0417 08/16/20 0326  AST 37 25 20  ALT $Re'29 22 19  'qmh$ ALKPHOS 40 39 38  BILITOT 0.5 0.5 0.5  PROT 5.0* 4.9* 4.9*  ALBUMIN 2.2* 2.0* 1.9*   No results  for input(s): LIPASE, AMYLASE in the last 168 hours. No results for input(s): AMMONIA in the last 168 hours. CBC: Recent Labs  Lab 08/12/20 0338 08/13/20 0400 08/13/20 1207 08/14/20 0328 08/15/20 0417 08/16/20 0326  WBC 3.9*  3.8* 2.3*  --  2.8* 1.6* 1.1*  NEUTROABS 3.1 1.5*  --  1.6*  --  0.4*  HGB 7.6*  7.6* 6.0*   < > 7.8* 8.2* 7.9*  HCT 23.1*  23.0* 17.9*   < > 22.6* 23.2* 22.8*  MCV 99.1  99.1 97.3  --  90.8 90.6 91.9  PLT 77*  77* 60*  --  43* 29* 28*   < > = values in this interval not displayed.   Cardiac Enzymes: No results for input(s): CKTOTAL, CKMB, CKMBINDEX, TROPONINI in the last 168 hours. CBG: Recent Labs  Lab 08/15/20 0749 08/15/20 1154 08/15/20 1650 08/15/20 2143 08/16/20 0741  GLUCAP 135* 147* 175* 132* 130*    Iron Studies:  Recent Labs    08/16/20 0326  FERRITIN 820*   Studies/Results: No results found. Medications: Infusions:  lactated ringers 75 mL/hr at 08/15/20 0229   meropenem (MERREM) IV     potassium chloride 10 mEq (08/16/20 0849)    promethazine (PHENERGAN) injection (IM or IVPB)     remdesivir 100 mg in NS 100 mL Stopped (08/15/20 0925)    Scheduled Medications:  (feeding supplement) PROSource Plus  30 mL Oral BID BM   sodium chloride   Intravenous Once   feeding supplement (NEPRO CARB STEADY)  237 mL Oral BID BM   finasteride  5 mg Oral Daily   insulin aspart  0-20 Units Subcutaneous TID WC   insulin aspart  0-5 Units Subcutaneous QHS   lidocaine  1 patch Transdermal Q24H   tamsulosin  0.4 mg Oral QHS    have reviewed scheduled and prn medications.  Physical Exam: General: eyes closed but responds to questions-  denies pain-  not eating  Heart: RRR Lungs: mostly clear Abdomen: soft, non tender Extremities: no edema    08/16/2020,10:53 AM  LOS: 5 days

## 2020-08-17 ENCOUNTER — Inpatient Hospital Stay (HOSPITAL_COMMUNITY): Payer: No Typology Code available for payment source

## 2020-08-17 DIAGNOSIS — N1832 Chronic kidney disease, stage 3b: Secondary | ICD-10-CM | POA: Diagnosis not present

## 2020-08-17 DIAGNOSIS — U071 COVID-19: Secondary | ICD-10-CM | POA: Diagnosis not present

## 2020-08-17 DIAGNOSIS — N179 Acute kidney failure, unspecified: Secondary | ICD-10-CM | POA: Diagnosis not present

## 2020-08-17 LAB — GLUCOSE, CAPILLARY
Glucose-Capillary: 144 mg/dL — ABNORMAL HIGH (ref 70–99)
Glucose-Capillary: 132 mg/dL — ABNORMAL HIGH (ref 70–99)
Glucose-Capillary: 73 mg/dL (ref 70–99)
Glucose-Capillary: 118 mg/dL — ABNORMAL HIGH (ref 70–99)

## 2020-08-17 LAB — CBC WITH DIFFERENTIAL/PLATELET
Abs Immature Granulocytes: 0.06 10*3/uL (ref 0.00–0.07)
Basophils Absolute: 0 10*3/uL (ref 0.0–0.1)
Basophils Relative: 0 %
Eosinophils Absolute: 0 10*3/uL (ref 0.0–0.5)
Eosinophils Relative: 3 %
HCT: 22.8 % — ABNORMAL LOW (ref 39.0–52.0)
Hemoglobin: 7.9 g/dL — ABNORMAL LOW (ref 13.0–17.0)
Immature Granulocytes: 6 %
Lymphocytes Relative: 39 %
Lymphs Abs: 0.4 10*3/uL — ABNORMAL LOW (ref 0.7–4.0)
MCH: 31.9 pg (ref 26.0–34.0)
MCHC: 34.6 g/dL (ref 30.0–36.0)
MCV: 91.9 fL (ref 80.0–100.0)
Monocytes Absolute: 0.1 10*3/uL (ref 0.1–1.0)
Monocytes Relative: 13 %
Neutro Abs: 0.4 10*3/uL — CL (ref 1.7–7.7)
Neutrophils Relative %: 39 %
Platelets: 28 10*3/uL — CL (ref 150–400)
RBC: 2.48 MIL/uL — ABNORMAL LOW (ref 4.22–5.81)
RDW: 17.5 % — ABNORMAL HIGH (ref 11.5–15.5)
WBC: 1.1 10*3/uL — CL (ref 4.0–10.5)
nRBC: 0 % (ref 0.0–0.2)

## 2020-08-17 LAB — COMPREHENSIVE METABOLIC PANEL
ALT: 16 U/L (ref 0–44)
AST: 18 U/L (ref 15–41)
Albumin: 1.8 g/dL — ABNORMAL LOW (ref 3.5–5.0)
Alkaline Phosphatase: 38 U/L (ref 38–126)
Anion gap: 12 (ref 5–15)
BUN: 97 mg/dL — ABNORMAL HIGH (ref 8–23)
CO2: 24 mmol/L (ref 22–32)
Calcium: 6.2 mg/dL — CL (ref 8.9–10.3)
Chloride: 94 mmol/L — ABNORMAL LOW (ref 98–111)
Creatinine, Ser: 7.07 mg/dL — ABNORMAL HIGH (ref 0.61–1.24)
GFR, Estimated: 8 mL/min — ABNORMAL LOW (ref >60)
Glucose, Bld: 125 mg/dL — ABNORMAL HIGH (ref 70–99)
Potassium: 3.7 mmol/L (ref 3.5–5.1)
Sodium: 130 mmol/L — ABNORMAL LOW (ref 135–145)
Total Bilirubin: 0.6 mg/dL (ref 0.3–1.2)
Total Protein: 5.1 g/dL — ABNORMAL LOW (ref 6.5–8.1)

## 2020-08-17 LAB — PROCALCITONIN: Procalcitonin: 0.71 ng/mL

## 2020-08-17 LAB — MAGNESIUM: Magnesium: 2.4 mg/dL (ref 1.7–2.4)

## 2020-08-17 LAB — C-REACTIVE PROTEIN: CRP: 13.5 mg/dL — ABNORMAL HIGH (ref <1.0)

## 2020-08-17 LAB — PHOSPHORUS: Phosphorus: 2.9 mg/dL (ref 2.5–4.6)

## 2020-08-17 LAB — LACTATE DEHYDROGENASE: LDH: 197 U/L — ABNORMAL HIGH (ref 98–192)

## 2020-08-17 MED ORDER — FREE WATER
50.0000 mL | Freq: Every day | Status: DC
  Administered 2020-08-17 – 2020-08-26 (×52): 50 mL

## 2020-08-17 MED ORDER — PROSOURCE PLUS PO LIQD
30.0000 mL | Freq: Every day | ORAL | Status: DC
  Administered 2020-08-18 – 2020-08-19 (×2): 30 mL via ORAL
  Filled 2020-08-17 (×2): qty 30

## 2020-08-17 MED ORDER — NEPRO/CARBSTEADY PO LIQD
237.0000 mL | ORAL | Status: DC
  Administered 2020-08-18 – 2020-08-19 (×2): 237 mL via ORAL
  Filled 2020-08-17 (×2): qty 237

## 2020-08-17 MED ORDER — PROSOURCE TF PO LIQD
45.0000 mL | Freq: Every day | ORAL | Status: DC
  Administered 2020-08-17 – 2020-08-20 (×4): 45 mL
  Filled 2020-08-17 (×4): qty 45

## 2020-08-17 MED ORDER — NEPRO/CARBSTEADY PO LIQD
1000.0000 mL | ORAL | Status: DC
  Administered 2020-08-17 – 2020-08-20 (×3): 1000 mL
  Filled 2020-08-17 (×5): qty 1000

## 2020-08-17 MED ORDER — VITAL HIGH PROTEIN PO LIQD: 1000.0000 mL | ORAL | Status: DC

## 2020-08-17 MED ORDER — MAGIC MOUTHWASH
10.0000 mL | Freq: Three times a day (TID) | ORAL | Status: DC
  Administered 2020-08-17 – 2020-09-11 (×21): 10 mL via ORAL
  Filled 2020-08-17 (×76): qty 10

## 2020-08-17 NOTE — Progress Notes (Addendum)
Nutrition Follow-up  DOCUMENTATION CODES:   Not applicable  INTERVENTION:  - once small bore placement confirmed, initiate Nepro @ 20 ml/hr x18 hours/day (1700-1100) and advance by 10 ml every 12 hours to reach goal rate of 50 ml/hr with 45 ml Prosource TF once/day and 50 ml every 3 hours during 18 hour TF run time.  - at goal rate, this regimen will provide 1660 kcal (87% minimum kcal need), 84 grams protein (84% minimum protein need), and 954 ml free water.   - will decrease Nepro Shake and Prosource Plus from BID to once/day.  - will liberalize diet from Renal, Carb Modified to Regular--MD approved.  - weigh patient today.    NUTRITION DIAGNOSIS:   Increased nutrient needs related to acute illness (COVID-19) as evidenced by estimated needs. -ongoing  GOAL:   Patient will meet greater than or equal to 90% of their needs -unmet  MONITOR:   TF tolerance, PO intake, Supplement acceptance, Labs, Weight trends  REASON FOR ASSESSMENT:   Consult Enteral/tube feeding initiation and management  ASSESSMENT:   Pt with a PMH significant of multiple myeloma (currently undergoing chemo), DM, HTN, HLD, asthma, and OA admitted with hypokalemia, AKI as well as COVID-19 infection  Patient is now noted to be a/o x3. He consumed 50% of breakfast on 6/24 but has mainly been consuming 0% or close to 0% at meals since diet advancement on 6/21 night.   Plan for small bore NGT placement and TF initiation today.   He has been accepting Prosource Plus and Nepro ~90% of the time offered.   He has not been weighed since admission on 6/21. No information documented in the edema section of flow sheet.    Per notes: - ARF on stage 3 CKD--ongoing discussions about openness to iHD, if needed - FTT in an adult - COVID-19 infection - multiple myeloma on revlimid - Full Code   Labs reviewed; CBGs: 135, 147, 175, and 132 mg/dl, Na: 129 mmol/l, K: 2.9 mmol/l, Cl: 89 mmol/l, BUN: 86 mg/dl, creatinine:  7.47 mg/dl, Ca: 5.9 mg/dl, Mg: 1.6 mg/dl, GFR: 7 ml/min.  Medications reviewed; sliding scale novolog, 2 g IV Mg sulfate x1 dose 6/26, 40 mEq Klor-Con x1 dose 6/26, 10 mEq IV KCl x4 runs 6/26, 100 mg IV remdesivir x1 dose (6/23-6/26). IVF; LR @ 75 ml/hr.    Diet Order:   Diet Order             Diet renal/carb modified with fluid restriction Diet-HS Snack? Nothing; Fluid restriction: 1200 mL Fluid; Room service appropriate? Yes; Fluid consistency: Thin  Diet effective now                   EDUCATION NEEDS:   No education needs have been identified at this time  Skin:  Skin Assessment: Reviewed RN Assessment  Last BM:  6/26 (type 6 x2)  Height:   Ht Readings from Last 1 Encounters:  08/11/20 $RemoveB'5\' 8"'hCSyaryJ$  (1.727 m)    Weight:   Wt Readings from Last 1 Encounters:  08/11/20 63.1 kg     Estimated Nutritional Needs:  Kcal:  1900-2100 kcal Protein:  100-115 grams Fluid:  >1.7L/d      Jarome Matin, MS, RD, LDN, CNSC Inpatient Clinical Dietitian RD pager # available in [facility]  After hours/weekend pager # available in [facility]

## 2020-08-17 NOTE — Progress Notes (Signed)
Progress Note    KEYVON HERTER   FRT:[id number]  DOB: 1945-04-21  DOA: 08/11/2020     6  PCP: [facility]  CC: cough, SOB  Hospital Course: AQIB LOUGH is a 75 y.o. male with medical history significant of multiple myeloma (on [name]), diabetes, hypertension, asthma, hyperlipidemia and osteoarthritis who was brought in by EMS secondary to productive cough, poor oral intake and not eating much.  Patient is currently undergoing chemo for his multiple myeloma.  Patient was a poor historian on admission.  He lives alone with 1 son living locally and another son that calls to check on him consistently.  He is a patient of the [facility] and managed there in regards to his multiple myeloma.  He underwent work-up in the ER and tested positive for COVID-19.  He was also found to be hyponatremic, hypokalemic, and significant renal failure.  He was started on fluids and admitted for further work-up.  Interval History:  Still very lethargic in bed. Not confused an says he's still on the fence on HD but still wants to be full code. He was agreeable to placing a feeding tube to start tube feeds since appetite remains poor.   ROS: Constitutional: positive for anorexia, fatigue, and malaise, negative for chills and fevers, Respiratory: negative for cough and sputum, Cardiovascular: negative for chest pain, and Gastrointestinal: negative for abdominal pain  Assessment & Plan: * Acute renal failure superimposed on stage 3b chronic kidney disease (HCC) - No recent labs to review but renal function in January 2022 was creatinine 1.74, BUN 27, GFR 44 -Presents with creatinine 7.27, BUN 67 -Wide differential for etiology but possibly/hopefully due to prolonged prerenal from poor oral intake which may have led to an ATN; otherwise could also be related to underlying multiple myeloma (less likely per oncology) or COVID-related - ACEi on hold; renal u/s negative for obstruction -  appreciate nephrology involvement as well given his large change in renal fxn - continue IVF for now; stopped bicarb and started LR (acidosis improved) - unfortunately not much renal improvement and he is growing weaker daily due to not eating hardly anything (see separate problem); he has no uremic signs yet but BUN continues to climb daily - he is now on the fence about HD; I tried discussing DNR when he was declining HD but now after our discussion he's undecided on HD b/c I told him if declining HD and it was needed then this would not align with his code status and goals of care - given worsening decline and need for ongoing [place] discussions, I will ask if Palliative care can help continue conversations with family and patient  Failure to thrive in adult - Likely multifactorial but even his son states patient has not been eating well prior to diagnosis of COVID.  He is also still not taking in hardly any nutrition during hospitalization and he continues to grow weaker each day.  I also discussed the possibility of short-term enteral feeding with an NG tube and the patient is currently declining that at this time.  I have also relayed this conversation and decision to his son, [name]. -I also discussed [place] with the patient bedside daily. He again is weaker today compared to yesterday but no obvious confusion or uremic signs yet.  - He still wishes to remain full code esp after discussing possibility of HD -Patient's prognosis is very guarded and worsening by the day - see discussion above too. Will  consult Palliative care to help continue conversations. Very concerned about his progressive decline and resuscitative efforts might prove to be futile  - 6/27: place cortrak and start TF due to severe poor PO intake since admission; patient amenable as well as son [name])  Pancytopenia ([place]) - presumed from chemo - trend CBC - Hgb down to 6 g/dL on 6/23. No obvious bleeding. BUN went up some too  so will watch this as well - s/p 2 units PRBC, post H/H is 7.8 g/dL -Hold chemical DVT prophylaxis given further downtrend of platelets and some vomiting this morning with subtle coffee-ground appearance  COVID-19 virus infection - main symptoms are lethargy/malaise, poor oral intake. No respiratory sxms - CT value 22 - given hx MM and treatment for it, will go ahead and initiate remdesivir while inpatient - trend inflammatory markers - no indication for steroids at this time; has remained on RA  Increased anion gap metabolic acidosis-resolved as of 08/16/2020 - etiology due to renal failure - s/p bicarb drip. Stopped on 6/24 - trend BMP  Multiple myeloma ([place]) - on [name] - [facility] at [facility] - currently [name] on hold per pharmacy - heme/onc consulted on 6/24 due to minimal renal improvement to further weigh in; no monocolonal spike per oncology  Hypokalemia - treated - trend BMP  Essential hypertension - continue current regimen  Diabetes mellitus with complication ([place]) - continue SSI and CBG monitoring   Hyperlipidemia - hold statin for now   Old records reviewed in assessment of this patient  Antimicrobials: Remdesivir 6/22 >> current  DVT prophylaxis: Place and maintain sequential compression device Start: 08/15/20 1140   Code Status:   Code Status: Full Code Family Communication: son  Disposition Plan: Status is: Inpatient  Remains inpatient appropriate because:IV treatments appropriate due to intensity of illness or inability to take PO and Inpatient level of care appropriate due to severity of illness  Dispo: The patient is from: Home              Anticipated d/c is to:  pending clinical course; prognosis guarded              Patient currently is not medically stable to d/c.   Difficult to place patient No  Risk of unplanned readmission score: Unplanned Admission- Pilot do not use: 25.87   Objective: Blood pressure 118/73, pulse 85,  temperature 98.3 F (36.8 C), temperature source Oral, resp. rate 16, height $RemoveBe'5\' 8"'hMgzYduRc$  (1.727 m), weight 63.1 kg, SpO2 95 %.  Examination: General appearance:  Elderly gentleman even more fatigued barely able to open his eyes and laying in bed in no distress Head: Normocephalic, without obvious abnormality, atraumatic Eyes:  EOMI Lungs: clear to auscultation bilaterally Heart: regular rate and rhythm and S1, S2 normal Abdomen: normal findings: bowel sounds normal and soft, non-tender Extremities:  No edema Skin:  Diffuse dry skin Neurologic: No focal deficits  Consultants:  Nephrology Oncology Palliative care medicine  Procedures:    Data Reviewed: I have personally reviewed following labs and imaging studies Results for orders placed or performed during the hospital encounter of 08/11/20 (from the past 24 hour(s))  Glucose, capillary     Status: Abnormal   Collection Time: 08/16/20  4:15 PM  Result Value Ref Range   Glucose-Capillary 191 (H) 70 - 99 mg/dL  Glucose, capillary     Status: Abnormal   Collection Time: 08/16/20  9:52 PM  Result Value Ref Range   Glucose-Capillary 202 (H) 70 - 99  mg/dL  Procalcitonin     Status: None   Collection Time: 08/17/20  3:12 AM  Result Value Ref Range   Procalcitonin 0.71 ng/mL  CBC with Differential/Platelet     Status: Abnormal   Collection Time: 08/17/20  3:12 AM  Result Value Ref Range   WBC 1.2 (LL) 4.0 - 10.5 K/uL   RBC 2.82 (L) 4.22 - 5.81 MIL/uL   Hemoglobin 8.9 (L) 13.0 - 17.0 g/dL   HCT 25.8 (L) 39.0 - 52.0 %   MCV 91.5 80.0 - 100.0 fL   MCH 31.6 26.0 - 34.0 pg   MCHC 34.5 30.0 - 36.0 g/dL   RDW 17.6 (H) 11.5 - 15.5 %   Platelets 27 (LL) 150 - 400 K/uL   nRBC 0.0 0.0 - 0.2 %   Neutrophils Relative % 30 %   Band Neutrophils 0 %   Lymphocytes Relative 52 %   Monocytes Relative 14 %   Eosinophils Relative 4 %   Basophils Relative 0 %   WBC Morphology NORMAL    RBC Morphology NORMAL    Smear Review DONE    Other  LEUKOPENIA %   nRBC 4 (H) 0 /100 WBC   Metamyelocytes Relative NONE SEEN %   Myelocytes NONE SEEN %   Promyelocytes Relative NONE SEEN %   Blasts NONE SEEN %  Comprehensive metabolic panel     Status: Abnormal   Collection Time: 08/17/20  3:12 AM  Result Value Ref Range   Sodium 130 (L) 135 - 145 mmol/L   Potassium 3.7 3.5 - 5.1 mmol/L   Chloride 94 (L) 98 - 111 mmol/L   CO2 24 22 - 32 mmol/L   Glucose, Bld 125 (H) 70 - 99 mg/dL   BUN 97 (H) 8 - 23 mg/dL   Creatinine, Ser 7.07 (H) 0.61 - 1.24 mg/dL   Calcium 6.2 (LL) 8.9 - 10.3 mg/dL   Total Protein 5.1 (L) 6.5 - 8.1 g/dL   Albumin 1.8 (L) 3.5 - 5.0 g/dL   AST 18 15 - 41 U/L   ALT 16 0 - 44 U/L   Alkaline Phosphatase 38 38 - 126 U/L   Total Bilirubin 0.6 0.3 - 1.2 mg/dL   GFR, Estimated 8 (L) >60 mL/min   Anion gap 12 5 - 15  Magnesium     Status: None   Collection Time: 08/17/20  3:12 AM  Result Value Ref Range   Magnesium 2.4 1.7 - 2.4 mg/dL  Phosphorus     Status: None   Collection Time: 08/17/20  3:12 AM  Result Value Ref Range   Phosphorus 2.9 2.5 - 4.6 mg/dL  C-reactive protein     Status: Abnormal   Collection Time: 08/17/20  3:12 AM  Result Value Ref Range   CRP 13.5 (H) <1.0 mg/dL  Lactate dehydrogenase     Status: Abnormal   Collection Time: 08/17/20  3:12 AM  Result Value Ref Range   LDH 197 (H) 98 - 192 U/L  Glucose, capillary     Status: Abnormal   Collection Time: 08/17/20  8:03 AM  Result Value Ref Range   Glucose-Capillary 144 (H) 70 - 99 mg/dL  Glucose, capillary     Status: Abnormal   Collection Time: 08/17/20 12:45 PM  Result Value Ref Range   Glucose-Capillary 132 (H) 70 - 99 mg/dL    Recent Results (from the past 240 hour(s))  Resp Panel by RT-PCR (Flu A&B, Covid) Nasopharyngeal Swab     Status: Abnormal  Collection Time: 08/11/20  3:50 PM   Specimen: Nasopharyngeal Swab; Nasopharyngeal(NP) swabs in vial transport medium  Result Value Ref Range Status   SARS Coronavirus 2 by RT PCR POSITIVE  (A) NEGATIVE Final    Comment: RESULT CALLED TO, READ BACK BY AND VERIFIED WITH: M.BELFI AT 1949 ON 06.21.22 BY N.THOMPSON (NOTE) SARS-CoV-2 target nucleic acids are DETECTED.  The SARS-CoV-2 RNA is generally detectable in upper respiratory specimens during the acute phase of infection. Positive results are indicative of the presence of the identified virus, but do not rule out bacterial infection or co-infection with other pathogens not detected by the test. Clinical correlation with patient history and other diagnostic information is necessary to determine patient infection status. The expected result is Negative.  Fact Sheet for Patients: EntrepreneurPulse.com.au  Fact Sheet for Healthcare Providers: IncredibleEmployment.be  This test is not yet approved or cleared by the [place] FDA and  has been authorized for detection and/or diagnosis of SARS-CoV-2 by FDA under an Emergency Use Authorization (EUA).  This EUA will remain in effect (meaning this tes t can be used) for the duration of  the COVID-19 declaration under Section 564(b)(1) of the Act, 21 U.S.C. section 360bbb-3(b)(1), unless the authorization is terminated or revoked sooner.     Influenza A by PCR NEGATIVE NEGATIVE Final   Influenza B by PCR NEGATIVE NEGATIVE Final    Comment: (NOTE) The Xpert Xpress SARS-CoV-2/FLU/RSV plus assay is intended as an aid in the diagnosis of influenza from Nasopharyngeal swab specimens and should not be used as a sole basis for treatment. Nasal washings and aspirates are unacceptable for Xpert Xpress SARS-CoV-2/FLU/RSV testing.  Fact Sheet for Patients: EntrepreneurPulse.com.au  Fact Sheet for Healthcare Providers: IncredibleEmployment.be  This test is not yet approved or cleared by the [place] FDA and has been authorized for detection and/or diagnosis of SARS-CoV-2 by FDA under an Emergency Use  Authorization (EUA). This EUA will remain in effect (meaning this test can be used) for the duration of the COVID-19 declaration under Section 564(b)(1) of the Act, 21 U.S.C. section 360bbb-3(b)(1), unless the authorization is terminated or revoked.  Performed at [facility], [place] [address] [place], [place] [address]   Blood Culture (routine x 2)     Status: None   Collection Time: 08/11/20  3:50 PM   Specimen: BLOOD  Result Value Ref Range Status   Specimen Description   Final    BLOOD BLOOD RIGHT FOREARM Performed at [facility] [address] [place], [place] [address]    Special Requests   Final    BOTTLES DRAWN AEROBIC AND ANAEROBIC Blood Culture adequate volume Performed at [facility] [address] [place], [place] [address]    Culture   Final    NO GROWTH 5 DAYS Performed at [facility], [place] [address] [place], [place] [address]    Report Status 08/16/2020 FINAL  Final  Blood Culture (routine x 2)     Status: None   Collection Time: 08/11/20  3:55 PM   Specimen: BLOOD  Result Value Ref Range Status   Specimen Description   Final    BLOOD BLOOD LEFT FOREARM Performed at [facility] [address], [place], [place] [address]    Special Requests   Final    BOTTLES DRAWN AEROBIC AND ANAEROBIC Blood Culture adequate volume Performed at [facility] [address] [place], [place] [address]    Culture  Final    NO GROWTH 5 DAYS Performed at [facility], [place] [address] [place], [place] [address]    Report Status 08/16/2020 FINAL  Final  Culture, Urine     Status: Abnormal   Collection Time: 08/14/20  3:11 PM   Specimen: Urine, Random  Result Value Ref Range Status   Specimen Description   Final    URINE, RANDOM Performed at [facility] [address] [place], [place] [address]    Special Requests   Final     NONE Performed at [facility], [place] [address] [place], [place] [address]    Culture MULTIPLE SPECIES PRESENT, SUGGEST RECOLLECTION (A)  Final   Report Status 08/15/2020 FINAL  Final     Radiology Studies: DG Abd Portable 1V  Result Date: 08/17/2020 CLINICAL DATA:  Feeding tube EXAM: PORTABLE ABDOMEN - 1 VIEW COMPARISON:  04/24/2019 FINDINGS: Esophageal tube tip overlies distal stomach. Surgical clips in the right upper quadrant. Multiple treated compression deformities of the thoracolumbar spine. Mild air distended small bowel in the right abdomen up to 3.5 cm with scattered colon gas and more focally dilated probable colon in the pelvis. IMPRESSION: 1. Esophageal tube tip overlies distal stomach. 2. Mild air distension of small bowel in the right mid abdomen with scattered colon gas, question mild ileus Electronically Signed   By: Donavan Foil M.D.   On: 08/17/2020 14:12   DG Abd Portable 1V  Final Result    US RENAL  Final Result    DG Chest Port 1 View  Final Result      Scheduled Meds:  [START ON 08/18/2020] (feeding supplement) PROSource Plus  30 mL Oral Daily   sodium chloride   Intravenous Once   feeding supplement (NEPRO CARB STEADY)  1,000 mL Per Tube Q24H   [START ON 08/18/2020] feeding supplement (NEPRO CARB STEADY)  237 mL Oral Q24H   feeding supplement (PROSource TF)  45 mL Per Tube Daily   finasteride  5 mg Oral Daily   free water  50 mL Per Tube 6 X Daily   insulin aspart  0-20 Units Subcutaneous TID WC   insulin aspart  0-5 Units Subcutaneous QHS   lidocaine  1 patch Transdermal Q24H   magic mouthwash  10 mL Oral TID   tamsulosin  0.4 mg Oral QHS   PRN Meds: acetaminophen, albuterol, alum & mag hydroxide-simeth, chlorpheniramine-HYDROcodone, guaiFENesin-dextromethorphan, ondansetron **OR** ondansetron (ZOFRAN) IV, phenol, promethazine (PHENERGAN) injection (IM or IVPB) Continuous Infusions:  lactated ringers 75 mL/hr at 08/17/20 1325    meropenem (MERREM) IV 1 g (08/17/20 0903)   promethazine (PHENERGAN) injection (IM or IVPB)       LOS: 6 days  Time spent: Greater than 50% of the 35 minute visit was spent in counseling/coordination of care for the patient as laid out in the A&P.   Dwyane Dee, MD [facility] 08/17/2020, 2:39 PM

## 2020-08-17 NOTE — Plan of Care (Signed)
  Problem: Clinical Measurements: Goal: Will remain free from infection Outcome: Progressing Goal: Cardiovascular complication will be avoided Outcome: Progressing   Problem: Nutrition: Goal: Adequate nutrition will be maintained Outcome: Not Progressing

## 2020-08-17 NOTE — Progress Notes (Signed)
Physical Therapy Treatment Patient Details Name: Jeff Rodriguez MRN: [id number] DOB: 11/09/1945 Today's Date: 08/17/2020    History of Present Illness patient is a 75 year old male who was admitted to the hospital on 6/22 with COVID 19 and AKI. past medical history significant for multiple myeloma, diabetes mellitus, hypertension, asthma, hyperlipidemia, osteoarthritis.    PT Comments    Pt was agreeable to bed level exercises but he declined OOB activity. Pt kept eyes closed for most of session. He also held onto emesis bag throughout session. Son was present during session. Pt will need SNF rehab at discharge.     Follow Up Recommendations  SNF     Equipment Recommendations  Rolling walker with 5" wheels    Recommendations for Other Services       Precautions / Restrictions Precautions Precautions: Fall Restrictions Weight Bearing Restrictions: No    Mobility  Bed Mobility               General bed mobility comments: NT-pt declined OOB despite encouragement from therapist and son    Transfers                    Ambulation/Gait                 Stairs             Wheelchair Mobility    Modified Rankin (Stroke Patients Only)       Balance                                            Cognition Arousal/Alertness: Lethargic Behavior During Therapy: WFL for tasks assessed/performed Overall Cognitive Status: Within Functional Limits for tasks assessed                                        Exercises General Exercises - Lower Extremity Ankle Circles/Pumps: AROM;Both;10 reps Quad Sets: AROM;Both;10 reps Heel Slides: AAROM;Both;10 reps Straight Leg Raises: AAROM;Both;5 reps    General Comments        Pertinent Vitals/Pain Pain Assessment: Faces Faces Pain Scale: Hurts a little bit Pain Location: LEs-generalized Pain Descriptors / Indicators: Discomfort;Sore;Grimacing Pain Intervention(s):  Limited activity within patient's tolerance;Monitored during session    Home Living                      Prior Function            PT Goals (current goals can now be found in the care plan section) Progress towards PT goals: Progressing toward goals    Frequency    Min 2X/week      PT Plan Frequency needs to be updated    Co-evaluation              AM-PAC PT "6 Clicks" Mobility   Outcome Measure  Help needed turning from your back to your side while in a flat bed without using bedrails?: A Lot Help needed moving from lying on your back to sitting on the side of a flat bed without using bedrails?: A Lot Help needed moving to and from a bed to a chair (including a wheelchair)?: A Lot Help needed standing up from a chair using your arms (e.g., wheelchair or bedside chair)?: A Lot  Help needed to walk in hospital room?: A Lot Help needed climbing 3-5 steps with a railing? : Total 6 Click Score: 11    End of Session   Activity Tolerance: Patient limited by fatigue Patient left: in bed;with call bell/phone within reach;with family/visitor present   PT Visit Diagnosis: Other abnormalities of gait and mobility (R26.89);Muscle weakness (generalized) (M62.81)     Time: 2831-5176 PT Time Calculation (min) (ACUTE ONLY): 21 min  Charges:  $Gait Training: 8-22 mins                         Doreatha Massed, PT Acute Rehabilitation  Office: [phone] Pager: [phone]

## 2020-08-17 NOTE — Progress Notes (Signed)
Subjective:  575 cc UOP yest and 1100 so far today, creat down slightly to 7.0.   Patient not seen directly today given COVID-19 + status, utilizing data taken from chart +/- discussions w/ providers and staff.    Objective Vital signs in last 24 hours: Vitals:   08/16/20 1203 08/16/20 2157 08/17/20 0616 08/17/20 1241  BP: 117/64 (!) 113/57 116/71 118/73  Pulse: 87 94 81 85  Resp: $Remo'20 20 19 16  'YAxkp$ Temp: 97.9 F (36.6 C) 98.6 F (37 C) 98.7 F (37.1 C) 98.3 F (36.8 C)  TempSrc: Axillary Oral Oral Oral  SpO2: 94% 94% 97% 95%  Weight:      Height:       Weight change:   Intake/Output Summary (Last 24 hours) at 08/17/2020 1534 Last data filed at 08/17/2020 1242 Gross per 24 hour  Intake 2512.62 ml  Output 1100 ml  Net 1412.62 ml     Assessment/ Plan: Pt is a 75 y.o. yo male with multiple myeloma who was admitted on 08/11/2020 with COVID-  A on CRT-  crt 1.7 in Jan 2022-  now 7- non oliguric  Assessment/Plan: 1. A on CRF-  possibly taking ACE-I prior to admit-  renal u/s neg for obstruction- urine shows 30 of prot-  6-10 RBC- pyuria but diff to interpret out of bag-  will order culture-  mult species-  urine does not seem obviously infected by visualization- UOP improving.  AKI Could be ATN from hypotension on ACE.  Myeloma kidney is also in the differential-  dont have details about his status with that-  just that he is on revlimid for a long time.  No dialysis indications at this time, but he clearly is not doing well, failing to thrive-  could be early uremia.  We are doing what we can-  trying to set the stage for renal recovery.  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 Cont IVF's, f/u labs daily.  Agree w/ pall care consult for [place].   2. HTN/volume-  not wet-  getting LR at 75 per hour-  to continue  3. Anemia-  blood transfusion 6/23-  due to myeloma and also AKI-  stable to improved 4. Hypocalcemia-  corrected is around 7.7-  no sxms - no action needed  -  got bolus during hosp 5. Hyponatremia-  waxing and waning 6. Hypokalemia- better    Sol Blazing    Labs: Basic Metabolic Panel: Recent Labs  Lab 08/15/20 0417 08/16/20 0326 08/17/20 0312  NA 129* 129* 130*  K 2.9* 3.0* 3.7  CL 89* 89* 94*  CO2 $Re'26 25 24  'rDv$ GLUCOSE 111* 127* 125*  BUN 86* 95* 97*  CREATININE 7.47* 7.21* 7.07*  CALCIUM 5.9* 6.1* 6.2*  PHOS 4.4 4.0 2.9    Liver Function Tests: Recent Labs  Lab 08/15/20 0417 08/16/20 0326 08/17/20 0312  AST $Re'25 20 18  'VDu$ ALT $R'22 19 16  'UK$ ALKPHOS 39 38 38  BILITOT 0.5 0.5 0.6  PROT 4.9* 4.9* 5.1*  ALBUMIN 2.0* 1.9* 1.8*    No results for input(s): LIPASE, AMYLASE in the last 168 hours. No results for input(s): AMMONIA in the last 168 hours. CBC: Recent Labs  Lab 08/13/20 0400 08/13/20 1207 08/14/20 0328 08/15/20 0417 08/16/20 0326 08/17/20 0312  WBC 2.3*  --  2.8* 1.6* 1.1* 1.2*  NEUTROABS 1.5*  --  1.6*  --  0.4*  --   HGB 6.0*   < >  7.8* 8.2* 7.9* 8.9*  HCT 17.9*   < > 22.6* 23.2* 22.8* 25.8*  MCV 97.3  --  90.8 90.6 91.9 91.5  PLT 60*  --  43* 29* 28* 27*   < > = values in this interval not displayed.    Cardiac Enzymes: No results for input(s): CKTOTAL, CKMB, CKMBINDEX, TROPONINI in the last 168 hours. CBG: Recent Labs  Lab 08/16/20 1159 08/16/20 1615 08/16/20 2152 08/17/20 0803 08/17/20 1245  GLUCAP 154* 191* 202* 144* 132*     Iron Studies:  Recent Labs    08/16/20 0326  FERRITIN 820*    Studies/Results: DG Abd Portable 1V  Result Date: 08/17/2020 CLINICAL DATA:  Feeding tube EXAM: PORTABLE ABDOMEN - 1 VIEW COMPARISON:  04/24/2019 FINDINGS: Esophageal tube tip overlies distal stomach. Surgical clips in the right upper quadrant. Multiple treated compression deformities of the thoracolumbar spine. Mild air distended small bowel in the right abdomen up to 3.5 cm with scattered colon gas and more focally dilated probable colon in the pelvis. IMPRESSION: 1. Esophageal tube tip overlies  distal stomach. 2. Mild air distension of small bowel in the right mid abdomen with scattered colon gas, question mild ileus Electronically Signed   By: Donavan Foil M.D.   On: 08/17/2020 14:12   Medications: Infusions:  lactated ringers 75 mL/hr at 08/17/20 1325   meropenem (MERREM) IV 1 g (08/17/20 0903)   promethazine (PHENERGAN) injection (IM or IVPB)      Scheduled Medications:  [START ON 08/18/2020] (feeding supplement) PROSource Plus  30 mL Oral Daily   sodium chloride   Intravenous Once   feeding supplement (NEPRO CARB STEADY)  1,000 mL Per Tube Q24H   [START ON 08/18/2020] feeding supplement (NEPRO CARB STEADY)  237 mL Oral Q24H   feeding supplement (PROSource TF)  45 mL Per Tube Daily   finasteride  5 mg Oral Daily   free water  50 mL Per Tube 6 X Daily   insulin aspart  0-20 Units Subcutaneous TID WC   insulin aspart  0-5 Units Subcutaneous QHS   lidocaine  1 patch Transdermal Q24H   magic mouthwash  10 mL Oral TID   tamsulosin  0.4 mg Oral QHS    have reviewed scheduled and prn medications.  Physical Exam: Patient not seen directly today given COVID-19 + status, utilizing data taken from chart +/- discussions w/ providers and staff.      08/17/2020,3:34 PM  LOS: 6 days

## 2020-08-18 ENCOUNTER — Inpatient Hospital Stay (HOSPITAL_COMMUNITY): Payer: No Typology Code available for payment source

## 2020-08-18 DIAGNOSIS — Z7189 Other specified counseling: Secondary | ICD-10-CM | POA: Diagnosis not present

## 2020-08-18 DIAGNOSIS — E118 Type 2 diabetes mellitus with unspecified complications: Secondary | ICD-10-CM | POA: Diagnosis not present

## 2020-08-18 DIAGNOSIS — C9000 Multiple myeloma not having achieved remission: Secondary | ICD-10-CM

## 2020-08-18 DIAGNOSIS — U071 COVID-19: Secondary | ICD-10-CM | POA: Diagnosis not present

## 2020-08-18 DIAGNOSIS — N179 Acute kidney failure, unspecified: Secondary | ICD-10-CM | POA: Diagnosis not present

## 2020-08-18 DIAGNOSIS — I10 Essential (primary) hypertension: Secondary | ICD-10-CM | POA: Diagnosis not present

## 2020-08-18 DIAGNOSIS — Z515 Encounter for palliative care: Secondary | ICD-10-CM

## 2020-08-18 DIAGNOSIS — D61818 Other pancytopenia: Secondary | ICD-10-CM

## 2020-08-18 LAB — CBC WITH DIFFERENTIAL/PLATELET
Abs Immature Granulocytes: 0.08 10*3/uL — ABNORMAL HIGH (ref 0.00–0.07)
Band Neutrophils: 0 %
Basophils Absolute: 0 10*3/uL (ref 0.0–0.1)
Basophils Relative: 0 %
Basophils Relative: 1 %
Blasts: NONE SEEN %
Eosinophils Absolute: 0.1 10*3/uL (ref 0.0–0.5)
Eosinophils Relative: 4 %
Eosinophils Relative: 8 %
HCT: 25.8 % — ABNORMAL LOW (ref 39.0–52.0)
HCT: 25.3 % — ABNORMAL LOW (ref 39.0–52.0)
Hemoglobin: 8.9 g/dL — ABNORMAL LOW (ref 13.0–17.0)
Hemoglobin: 8.4 g/dL — ABNORMAL LOW (ref 13.0–17.0)
Immature Granulocytes: 6 %
Lymphocytes Relative: 52 %
Lymphocytes Relative: 43 %
Lymphs Abs: 0.6 10*3/uL — ABNORMAL LOW (ref 0.7–4.0)
MCH: 31.6 pg (ref 26.0–34.0)
MCH: 31.3 pg (ref 26.0–34.0)
MCHC: 34.5 g/dL (ref 30.0–36.0)
MCHC: 33.2 g/dL (ref 30.0–36.0)
MCV: 91.5 fL (ref 80.0–100.0)
MCV: 94.4 fL (ref 80.0–100.0)
Metamyelocytes Relative: NONE SEEN %
Monocytes Absolute: 0.2 10*3/uL (ref 0.1–1.0)
Monocytes Relative: 14 %
Monocytes Relative: 14 %
Myelocytes: NONE SEEN %
Neutro Abs: 0.4 10*3/uL — CL (ref 1.7–7.7)
Neutrophils Relative %: 30 %
Neutrophils Relative %: 28 %
Platelets: 27 10*3/uL — CL (ref 150–400)
Platelets: 23 10*3/uL — CL (ref 150–400)
Promyelocytes Relative: NONE SEEN %
RBC Morphology: NORMAL
RBC: 2.82 MIL/uL — ABNORMAL LOW (ref 4.22–5.81)
RBC: 2.68 MIL/uL — ABNORMAL LOW (ref 4.22–5.81)
RDW: 17.6 % — ABNORMAL HIGH (ref 11.5–15.5)
RDW: 17.8 % — ABNORMAL HIGH (ref 11.5–15.5)
WBC Morphology: NORMAL
WBC: 1.2 10*3/uL — CL (ref 4.0–10.5)
WBC: 1.3 10*3/uL — CL (ref 4.0–10.5)
nRBC: 0 % (ref 0.0–0.2)
nRBC: 4 /100 WBC — ABNORMAL HIGH
nRBC: 0 % (ref 0.0–0.2)

## 2020-08-18 LAB — COMPREHENSIVE METABOLIC PANEL
ALT: 14 U/L (ref 0–44)
AST: 15 U/L (ref 15–41)
Albumin: 1.9 g/dL — ABNORMAL LOW (ref 3.5–5.0)
Alkaline Phosphatase: 37 U/L — ABNORMAL LOW (ref 38–126)
Anion gap: 11 (ref 5–15)
BUN: 96 mg/dL — ABNORMAL HIGH (ref 8–23)
CO2: 23 mmol/L (ref 22–32)
Calcium: 6 mg/dL — CL (ref 8.9–10.3)
Chloride: 95 mmol/L — ABNORMAL LOW (ref 98–111)
Creatinine, Ser: 7.16 mg/dL — ABNORMAL HIGH (ref 0.61–1.24)
GFR, Estimated: 7 mL/min — ABNORMAL LOW (ref >60)
Glucose, Bld: 140 mg/dL — ABNORMAL HIGH (ref 70–99)
Potassium: 3.4 mmol/L — ABNORMAL LOW (ref 3.5–5.1)
Sodium: 129 mmol/L — ABNORMAL LOW (ref 135–145)
Total Bilirubin: 0.5 mg/dL (ref 0.3–1.2)
Total Protein: 4.9 g/dL — ABNORMAL LOW (ref 6.5–8.1)

## 2020-08-18 LAB — GLUCOSE, CAPILLARY
Glucose-Capillary: 116 mg/dL — ABNORMAL HIGH (ref 70–99)
Glucose-Capillary: 128 mg/dL — ABNORMAL HIGH (ref 70–99)
Glucose-Capillary: 134 mg/dL — ABNORMAL HIGH (ref 70–99)
Glucose-Capillary: 134 mg/dL — ABNORMAL HIGH (ref 70–99)
Glucose-Capillary: 136 mg/dL — ABNORMAL HIGH (ref 70–99)
Glucose-Capillary: 149 mg/dL — ABNORMAL HIGH (ref 70–99)
Glucose-Capillary: 79 mg/dL (ref 70–99)

## 2020-08-18 LAB — PHOSPHORUS: Phosphorus: 3.3 mg/dL (ref 2.5–4.6)

## 2020-08-18 LAB — LACTATE DEHYDROGENASE: LDH: 180 U/L (ref 98–192)

## 2020-08-18 LAB — MAGNESIUM: Magnesium: 2.3 mg/dL (ref 1.7–2.4)

## 2020-08-18 LAB — C-REACTIVE PROTEIN: CRP: 12.1 mg/dL — ABNORMAL HIGH (ref <1.0)

## 2020-08-18 MED ORDER — CALCIUM GLUCONATE-NACL 2-0.675 GM/100ML-% IV SOLN
2.0000 g | Freq: Once | INTRAVENOUS | Status: AC
  Administered 2020-08-18: 2000 mg via INTRAVENOUS
  Filled 2020-08-18: qty 100

## 2020-08-18 MED ORDER — TBO-FILGRASTIM 480 MCG/0.8ML ~~LOC~~ SOSY
480.0000 ug | PREFILLED_SYRINGE | Freq: Every day | SUBCUTANEOUS | Status: DC
  Administered 2020-08-18 – 2020-08-20 (×3): 480 ug via SUBCUTANEOUS
  Filled 2020-08-18 (×3): qty 0.8

## 2020-08-18 NOTE — Progress Notes (Signed)
He really does not look like his renal function is improving.  I thought that he had a urinary tract infection.  His urinary culture really did not show any obvious bacteria.  He had multiple species.  Today, his BUN is 96 creatinine of 7.2.  His calcium is 6.  His total protein is 4.9 with an albumin of 1.9.  I thought that he had light chains sent off.  They have not been sent off.  He is still markedly pancytopenic.  His white cell count is 1.3.  Hemoglobin 8.4.  Platelet count 23,000.  I will give him some Neupogen.  I suspect that the pancytopenia is probably from the [name] that he was taken in view of the renal failure that he has.  However, I cannot discount the possibility that myeloma could be the problem.  However, I think only way we would find this out is through a bone marrow biopsy.  I doubt that he will have a bone marrow biopsy done because he is COVID-positive.  He still has a very poor performance status.  His albumin is only 1.9.  His total protein is 4.9.  Sometimes, we can see depressed protein/albumin in view of light chain myeloma.  Hopefully, the Neupogen will help with his neutropenia.  Pete Ennever, MD  Psalm 25:17 

## 2020-08-18 NOTE — Progress Notes (Signed)
Subjective:  good UOP, creat unchanged around 7.0   Objective Vital signs in last 24 hours: Vitals:   08/17/20 1241 08/17/20 2013 08/18/20 0411 08/18/20 1336  BP: 118/73 121/70  118/64  Pulse: 85 90  69  Resp: $Remo'16 18  16  'qaMQn$ Temp: 98.3 F (36.8 C) 98.4 F (36.9 C)  98.2 F (36.8 C)  TempSrc: Oral Oral  Oral  SpO2: 95% 95%  95%  Weight:   75.5 kg   Height:       Weight change:   Intake/Output Summary (Last 24 hours) at 08/18/2020 1559 Last data filed at 08/18/2020 1134 Gross per 24 hour  Intake 2366.28 ml  Output 600 ml  Net 1766.28 ml     Assessment/ Plan: Pt is a 75 y.o. yo male with multiple myeloma who was admitted on 08/11/2020 with COVID-  A on CRT-  crt 1.7 in Jan 2022-  now 7- non oliguric  Assessment/Plan: 1. A on CRF-  possibly taking ACE-I prior to admit-  renal u/s neg for obstruction- urine shows 30 of prot-  6-10 RBC- pyuria but diff to interpret out of bag-  will order culture-  mult species-  urine does not seem obviously infected by visualization- UOP improving.  AKI Could be ATN from hypotension on ACE.  Myeloma kidney is also in the differential and is this is myeloma kidney, it is unlikely to improve.  Initially pt was refusing dialysis, but now is open to it. IVF"s haven't improved his numbers. He will need HD soon if not sig improvement. Have d/w the son. Will d/w pt and son in the morning tomorrow and if pt is willing, will plan transfer to [facility] for expected dialysis. Will lower IVF"s to 40 cc/hr. .   2. HTN/volume-  wt's and I/O's are up 8-10 L/ kg, will lower fluid rate 3. Anemia-  blood transfusion 6/23-  due to myeloma and also AKI-  stable to improved 4. Hypocalcemia-  corrected is around 7.7-  no sxms - no action needed -  got bolus during hosp 5. Hyponatremia-  waxing and waning 6. Hypokalemia- better   Kelly Splinter, MD 08/18/2020, 4:03 PM       Labs: Basic Metabolic Panel: Recent Labs  Lab 08/16/20 0326 08/17/20 0312 08/18/20 0316  NA 129*  130* 129*  K 3.0* 3.7 3.4*  CL 89* 94* 95*  CO2 $Re'25 24 23  'hzq$ GLUCOSE 127* 125* 140*  BUN 95* 97* 96*  CREATININE 7.21* 7.07* 7.16*  CALCIUM 6.1* 6.2* 6.0*  PHOS 4.0 2.9 3.3    Liver Function Tests: Recent Labs  Lab 08/16/20 0326 08/17/20 0312 08/18/20 0316  AST $Re'20 18 15  'Roz$ ALT $R'19 16 14  'yM$ ALKPHOS 38 38 37*  BILITOT 0.5 0.6 0.5  PROT 4.9* 5.1* 4.9*  ALBUMIN 1.9* 1.8* 1.9*    No results for input(s): LIPASE, AMYLASE in the last 168 hours. No results for input(s): AMMONIA in the last 168 hours. CBC: Recent Labs  Lab 08/14/20 0328 08/15/20 0417 08/16/20 0326 08/17/20 0312 08/18/20 0316  WBC 2.8* 1.6* 1.1* 1.2* 1.3*  NEUTROABS 1.6*  --  0.4*  --  0.4*  HGB 7.8* 8.2* 7.9* 8.9* 8.4*  HCT 22.6* 23.2* 22.8* 25.8* 25.3*  MCV 90.8 90.6 91.9 91.5 94.4  PLT 43* 29* 28* 27* 23*    Cardiac Enzymes: No results for input(s): CKTOTAL, CKMB, CKMBINDEX, TROPONINI in the last 168 hours. CBG: Recent Labs  Lab 08/17/20 2007 08/18/20 0006 08/18/20 0406 08/18/20 9201 08/18/20  [place] [facility] [place]     Iron Studies:  Recent Labs    08/16/20 0326  FERRITIN 820*    Studies/Results: DG Abd Portable 1V  Result Date: 08/17/2020 CLINICAL DATA:  Feeding tube EXAM: PORTABLE ABDOMEN - 1 VIEW COMPARISON:  04/24/2019 FINDINGS: Esophageal tube tip overlies distal stomach. Surgical clips in the right upper quadrant. Multiple treated compression deformities of the thoracolumbar spine. Mild air distended small bowel in the right abdomen up to 3.5 cm with scattered colon gas and more focally dilated probable colon in the pelvis. IMPRESSION: 1. Esophageal tube tip overlies distal stomach. 2. Mild air distension of small bowel in the right mid abdomen with scattered colon gas, question mild ileus Electronically Signed   By: Donavan Foil M.D.   On: 08/17/2020 14:12   DG Bone Survey Met  Result Date: 08/18/2020 CLINICAL DATA:  History of myeloma. EXAM: METASTATIC BONE SURVEY  COMPARISON:  Various previous imaging studies. FINDINGS: Lateral skull: No lytic lesion. Right shoulder: No lytic lesion.  Chronic degenerative changes. Left shoulder: No lytic lesion.  Chronic degenerative changes. Left humerus: No lytic lesion. Right humerus: No lytic lesion. Right forearm: No lytic lesion. Left forearm: No lytic lesion. Cervical spine: Previous anterior and posterior fusion. No evidence of active lytic lesion. Thoracic spine two views: Distant augmentation at T10 and T12. No lytic lesion or fracture seen otherwise. Lumbar spine two views: Previous augmentation at L1, L2, L3 and L5. No sign of lytic lesion. No progressive collapse. AP pelvis one view: No lytic lesion. Previous hip replacement on the right. Left hip: No lytic lesion Left femur: No lytic lesion.  Previous knee replacement. Right femur: No lytic lesion. Previous hip replacement and knee replacement. Right hip: No lytic lesion.  Previous hip replacement. Right tib fib: No lytic lesion. Left hip fib: No lytic lesion. AP chest one view: Soft feeding tube enters the abdomen. Atelectasis or infiltrate in both lung bases, new since the study 1 week ago. No lytic lesions seen in the ribcage. IMPRESSION: No skeletal lytic lesions. Multiple augmented thoracic and lumbar fractures. No new or progressive fractures. Newly seen bilateral lower lobe atelectasis and or pneumonia. Electronically Signed   By: Nelson Chimes M.D.   On: 08/18/2020 15:14   Medications: Infusions:  lactated ringers 75 mL/hr at 08/17/20 2209   promethazine (PHENERGAN) injection (IM or IVPB)      Scheduled Medications:  (feeding supplement) PROSource Plus  30 mL Oral Daily   sodium chloride   Intravenous Once   feeding supplement (NEPRO CARB STEADY)  1,000 mL Per Tube Q24H   feeding supplement (NEPRO CARB STEADY)  237 mL Oral Q24H   feeding supplement (PROSource TF)  45 mL Per Tube Daily   finasteride  5 mg Oral Daily   free water  50 mL Per Tube 6 X Daily    insulin aspart  0-20 Units Subcutaneous TID WC   insulin aspart  0-5 Units Subcutaneous QHS   lidocaine  1 patch Transdermal Q24H   magic mouthwash  10 mL Oral TID   tamsulosin  0.4 mg Oral QHS   Tbo-Filgrastim  480 mcg Subcutaneous q1800    have reviewed scheduled and prn medications.  Physical Exam: Patient not seen directly today given COVID-19 + status, utilizing data taken from chart +/- discussions w/ providers and staff.      08/18/2020,3:59 PM  LOS: 7 days

## 2020-08-18 NOTE — Plan of Care (Signed)
  Problem: Clinical Measurements: Goal: Ability to maintain clinical measurements within normal limits will improve Outcome: Progressing Goal: Respiratory complications will improve Outcome: Progressing   Problem: Nutrition: Goal: Adequate nutrition will be maintained Outcome: Progressing   

## 2020-08-18 NOTE — Progress Notes (Addendum)
PROGRESS NOTE    Jeff Rodriguez  YJE:[id number] DOB: Nov 11, 1945 DOA: 08/11/2020 PCP: [facility], [facility] [facility]   Brief Narrative: Taken from prior notes. Jeff Rodriguez is a 75 y.o. male with medical history significant of multiple myeloma (on [name]), diabetes, hypertension, asthma, hyperlipidemia and osteoarthritis who was brought in by EMS secondary to productive cough, poor oral intake and not eating much.  Patient is currently undergoing chemo for his multiple myeloma. Patient was a poor historian on admission.  He lives alone with 1 son living locally and another son that calls to check on him consistently.  He is a patient of the [facility] and managed there in regards to his multiple myeloma.   He underwent work-up in the ER and tested positive for COVID-19.  He was also found to be hyponatremic, hypokalemic, and significant renal failure.  He was started on fluids and admitted for further work-up.  Creatinine remained elevated, some improvement in urinary output.  Remains very weak and lethargic.  NG tube was placed and he was started on tube feed as appetite remains poor.  Worsening cell counts, neutropenia and thrombocytopenia, oncology ordered Neupogen. Bone scan done today, 08/18/2020  was negative for any new lytic bone lesions.  Patient is currently full code, very high risk for deterioration and death.  Palliative care was consulted.  Subjective: Patient continues to feel very weak and lethargic.  Denies any pain or shortness of breath.  Assessment & Plan:   Principal Problem:   Acute renal failure superimposed on stage 3b chronic kidney disease ([place]) Active Problems:   Hyperlipidemia   Diabetes mellitus with complication ([place])   Essential hypertension   COVID-19 virus infection   Hypokalemia   Pancytopenia (HCC)   Multiple myeloma (HCC)   Failure to thrive in adult  AKI with CKD stage IIIb.  Baseline creatinine appears to be around 1.7-2.  Creatinine  remained above 7.  Nephrology is on board-appreciate their help.  Some improvement in urinary output. Differential includes ATN, prerenal from poor p.o. intake, myeloma kidney and he was also on [name] for a long time. -Monitor renal function -Avoid nephrotoxins  Pancytopenia.  Worsening cell count.  S/p 2 units of PRBC.  Hemoglobin stable at 8.4 with worsening leukopenia and thrombocytopenia.  Remained afebrile. He was started on broad-spectrum antibiotics with meropenem which he received for 5 days with some concern of UTI, urine cultures with multiple flora. -Hematology is on board and they start him on Neupogen today. -Monitor cell count. -Discontinue meropenem and observe for any fever.  Failure to thrive.  Most likely multiple factorial with underlying malignancy, recent COVID infection and renal failure. He was started on tube feed-started on 08/17/2020 Prognosis very guarded-patient wishes to stay full code. High risk for refeeding syndrome. -Continue with tube feed -Monitor electrolytes  COVID-19 infection.  On room air. Completed the course of remdesivir. -Continue with supportive care as needed.  Multiple myeloma. -[name] is on hold. -Bone scan without any new lytic lesions today. -Oncology is on board -Repeat flow cytometry was ordered--pending results.  Electrolyte abnormalities.  Multiple electrolyte abnormalities which include hyponatremia with sodium at 129, mild hypokalemia and hypocalcemia with corrected calcium of 7.7. -Replete electrolyte as needed -Ordered 2 g of calcium carbonate -Monitor sodium.  Essential hypertension.  Blood pressure within goal. -Continue current management.  Diabetes mellitus. -Continue with SSI  Objective: Vitals:   08/17/20 1241 08/17/20 2013 08/18/20 0411 08/18/20 1336  BP: 118/73 121/70  118/64  Pulse: 85 90  69  Resp: $Remo'16 18  16  'XTboJ$ Temp: 98.3 F (36.8 C) 98.4 F (36.9 C)  98.2 F (36.8 C)  TempSrc: Oral Oral  Oral   SpO2: 95% 95%  95%  Weight:   75.5 kg   Height:        Intake/Output Summary (Last 24 hours) at 08/18/2020 1541 Last data filed at 08/18/2020 1134 Gross per 24 hour  Intake 2366.28 ml  Output 600 ml  Net 1766.28 ml   Filed Weights   08/11/20 1555 08/11/20 2245 08/18/20 0411  Weight: 90.7 kg 63.1 kg 75.5 kg    Examination:  General exam: Chronically ill-appearing elderly man, appears calm and comfortable, feeding tube in place. Respiratory system: Clear to auscultation. Respiratory effort normal. Cardiovascular system: S1 & S2 heard, RRR. Gastrointestinal system: Soft, nontender, nondistended, bowel sounds positive. Central nervous system: Alert and oriented. No focal neurological deficits.Symmetric 5 x 5 power. Extremities: No edema, no cyanosis, pulses intact and symmetrical. Psychiatry: Judgement and insight appear normal.   DVT prophylaxis: SCDs, patient has thrombocytopenia Code Status: Full Family Communication: Discussed with son at bedside. Disposition Plan:  Status is: Inpatient  Remains inpatient appropriate because:Inpatient level of care appropriate due to severity of illness  Dispo: The patient is from: Home              Anticipated d/c is to: Home              Patient currently is not medically stable to d/c.   Difficult to place patient No              Level of care: Telemetry  All the records are reviewed and case discussed with Care Management/Social Worker. Management plans discussed with the patient, nursing and they are in agreement.  Consultants:  Nephrology Oncology Palliative care medicine  Procedures:  Antimicrobials:   Data Reviewed: I have personally reviewed following labs and imaging studies  CBC: Recent Labs  Lab 08/12/20 0338 08/13/20 0400 08/13/20 1207 08/14/20 0328 08/15/20 0417 08/16/20 0326 08/17/20 0312 08/18/20 0316  WBC 3.9*  3.8* 2.3*  --  2.8* 1.6* 1.1* 1.2* 1.3*  NEUTROABS 3.1 1.5*  --  1.6*  --  0.4*  --  0.4*   HGB 7.6*  7.6* 6.0*   < > 7.8* 8.2* 7.9* 8.9* 8.4*  HCT 23.1*  23.0* 17.9*   < > 22.6* 23.2* 22.8* 25.8* 25.3*  MCV 99.1  99.1 97.3  --  90.8 90.6 91.9 91.5 94.4  PLT 77*  77* 60*  --  43* 29* 28* 27* 23*   < > = values in this interval not displayed.   Basic Metabolic Panel: Recent Labs  Lab 08/14/20 0328 08/15/20 0417 08/16/20 0326 08/17/20 0312 08/18/20 0316  NA 132* 129* 129* 130* 129*  K 3.5 2.9* 3.0* 3.7 3.4*  CL 93* 89* 89* 94* 95*  CO2 $Re'23 26 25 24 23  'Jmg$ GLUCOSE 161* 111* 127* 125* 140*  BUN 83* 86* 95* 97* 96*  CREATININE 7.23* 7.47* 7.21* 7.07* 7.16*  CALCIUM 6.5* 5.9* 6.1* 6.2* 6.0*  MG 1.8 1.6* 1.9 2.4 2.3  PHOS 4.6 4.4 4.0 2.9 3.3   GFR: Estimated Creatinine Clearance: 8.8 mL/min (A) (by C-G formula based on SCr of 7.16 mg/dL (H)). Liver Function Tests: Recent Labs  Lab 08/14/20 0328 08/15/20 0417 08/16/20 0326 08/17/20 0312 08/18/20 0316  AST 37 $Remo'25 20 18 15  'sdajT$ ALT $Rem'29 22 19 16 14  'jcTi$ ALKPHOS 40 39 38 38 37*  BILITOT  0.5 0.5 0.5 0.6 0.5  PROT 5.0* 4.9* 4.9* 5.1* 4.9*  ALBUMIN 2.2* 2.0* 1.9* 1.8* 1.9*   No results for input(s): LIPASE, AMYLASE in the last 168 hours. No results for input(s): AMMONIA in the last 168 hours. Coagulation Profile: No results for input(s): INR, PROTIME in the last 168 hours. Cardiac Enzymes: No results for input(s): CKTOTAL, CKMB, CKMBINDEX, TROPONINI in the last 168 hours. BNP (last 3 results) No results for input(s): PROBNP in the last 8760 hours. HbA1C: No results for input(s): HGBA1C in the last 72 hours. CBG: Recent Labs  Lab 08/17/20 2007 08/18/20 0006 08/18/20 0406 08/18/20 0814 08/18/20 1131  GLUCAP 118* 128* 149* 134* 79   Lipid Profile: No results for input(s): CHOL, HDL, LDLCALC, TRIG, CHOLHDL, LDLDIRECT in the last 72 hours. Thyroid Function Tests: No results for input(s): TSH, T4TOTAL, FREET4, T3FREE, THYROIDAB in the last 72 hours. Anemia Panel: Recent Labs    08/16/20 0326  FERRITIN 820*    Sepsis Labs: Recent Labs  Lab 08/11/20 1550 08/11/20 1627 08/11/20 1750 08/15/20 0417 08/16/20 0326 08/17/20 0312  PROCALCITON  --  1.00  --  1.12 1.03 0.71  LATICACIDVEN 1.1  --  1.5  --   --   --     Recent Results (from the past 240 hour(s))  Resp Panel by RT-PCR (Flu A&B, Covid) Nasopharyngeal Swab     Status: Abnormal   Collection Time: 08/11/20  3:50 PM   Specimen: Nasopharyngeal Swab; Nasopharyngeal(NP) swabs in vial transport medium  Result Value Ref Range Status   SARS Coronavirus 2 by RT PCR POSITIVE (A) NEGATIVE Final    Comment: RESULT CALLED TO, READ BACK BY AND VERIFIED WITH: M.BELFI AT 1949 ON 06.21.22 BY N.THOMPSON (NOTE) SARS-CoV-2 target nucleic acids are DETECTED.  The SARS-CoV-2 RNA is generally detectable in upper respiratory specimens during the acute phase of infection. Positive results are indicative of the presence of the identified virus, but do not rule out bacterial infection or co-infection with other pathogens not detected by the test. Clinical correlation with patient history and other diagnostic information is necessary to determine patient infection status. The expected result is Negative.  Fact Sheet for Patients: EntrepreneurPulse.com.au  Fact Sheet for Healthcare Providers: IncredibleEmployment.be  This test is not yet approved or cleared by the [place] FDA and  has been authorized for detection and/or diagnosis of SARS-CoV-2 by FDA under an Emergency Use Authorization (EUA).  This EUA will remain in effect (meaning this tes t can be used) for the duration of  the COVID-19 declaration under Section 564(b)(1) of the Act, 21 U.S.C. section 360bbb-3(b)(1), unless the authorization is terminated or revoked sooner.     Influenza A by PCR NEGATIVE NEGATIVE Final   Influenza B by PCR NEGATIVE NEGATIVE Final    Comment: (NOTE) The Xpert Xpress SARS-CoV-2/FLU/RSV plus assay is intended as an  aid in the diagnosis of influenza from Nasopharyngeal swab specimens and should not be used as a sole basis for treatment. Nasal washings and aspirates are unacceptable for Xpert Xpress SARS-CoV-2/FLU/RSV testing.  Fact Sheet for Patients: EntrepreneurPulse.com.au  Fact Sheet for Healthcare Providers: IncredibleEmployment.be  This test is not yet approved or cleared by the [place] FDA and has been authorized for detection and/or diagnosis of SARS-CoV-2 by FDA under an Emergency Use Authorization (EUA). This EUA will remain in effect (meaning this test can be used) for the duration of the COVID-19 declaration under Section 564(b)(1) of the Act, 21 U.S.C. section 360bbb-3(b)(1), unless the  authorization is terminated or revoked.  Performed at [facility], [place] [address] [place], [place] [address]   Blood Culture (routine x 2)     Status: None   Collection Time: 08/11/20  3:50 PM   Specimen: BLOOD  Result Value Ref Range Status   Specimen Description   Final    BLOOD BLOOD RIGHT FOREARM Performed at [facility] [address], [place], [place] [address]    Special Requests   Final    BOTTLES DRAWN AEROBIC AND ANAEROBIC Blood Culture adequate volume Performed at [facility] [address] [place], [place] [address]    Culture   Final    NO GROWTH 5 DAYS Performed at [facility], [place] [address] [place], [place] [address]    Report Status 08/16/2020 FINAL  Final  Blood Culture (routine x 2)     Status: None   Collection Time: 08/11/20  3:55 PM   Specimen: BLOOD  Result Value Ref Range Status   Specimen Description   Final    BLOOD BLOOD LEFT FOREARM Performed at [facility] [address] [place], [place] [address]    Special Requests   Final    BOTTLES DRAWN AEROBIC AND ANAEROBIC Blood Culture adequate volume Performed at [facility] [address] [place], [place] [address]    Culture   Final    NO GROWTH 5 DAYS Performed at [facility], [place] [address] [place], [place] [address]    Report Status 08/16/2020 FINAL  Final  Culture, Urine     Status: Abnormal   Collection Time: 08/14/20  3:11 PM   Specimen: Urine, Random  Result Value Ref Range Status   Specimen Description   Final    URINE, RANDOM Performed at [facility] [address] [place], [place] [address]    Special Requests   Final    NONE Performed at [facility], [place] [address], [place],  [address]    Culture MULTIPLE SPECIES PRESENT, SUGGEST RECOLLECTION (A)  Final   Report Status 08/15/2020 FINAL  Final     Radiology Studies: DG Abd Portable 1V  Result Date: 08/17/2020 CLINICAL DATA:  Feeding tube EXAM: PORTABLE ABDOMEN - 1 VIEW COMPARISON:  04/24/2019 FINDINGS: Esophageal tube tip overlies distal stomach. Surgical clips in the right upper quadrant. Multiple treated compression deformities of the thoracolumbar spine. Mild air distended small bowel in the right abdomen up to 3.5 cm with scattered colon gas and more focally dilated probable colon in the pelvis. IMPRESSION: 1. Esophageal tube tip overlies distal stomach. 2. Mild air distension of small bowel in the right mid abdomen with scattered colon gas, question mild ileus Electronically Signed   By: Donavan Foil M.D.   On: 08/17/2020 14:12   DG Bone Survey Met  Result Date: 08/18/2020 CLINICAL DATA:  History of myeloma. EXAM: METASTATIC BONE SURVEY COMPARISON:  Various previous imaging studies. FINDINGS: Lateral skull: No lytic lesion. Right shoulder: No lytic lesion.  Chronic degenerative changes. Left shoulder: No lytic lesion.  Chronic degenerative changes. Left humerus: No lytic lesion. Right humerus: No lytic lesion. Right forearm: No lytic lesion. Left forearm: No lytic lesion. Cervical spine: Previous anterior and  posterior fusion. No evidence of active lytic lesion. Thoracic spine two views: Distant augmentation at T10 and T12. No lytic lesion or fracture seen otherwise. Lumbar spine two views: Previous augmentation at L1, L2, L3 and  L5. No sign of lytic lesion. No progressive collapse. AP pelvis one view: No lytic lesion. Previous hip replacement on the right. Left hip: No lytic lesion Left femur: No lytic lesion.  Previous knee replacement. Right femur: No lytic lesion. Previous hip replacement and knee replacement. Right hip: No lytic lesion.  Previous hip replacement. Right tib fib: No lytic lesion. Left hip fib: No lytic lesion. AP chest one view: Soft feeding tube enters the abdomen. Atelectasis or infiltrate in both lung bases, new since the study 1 week ago. No lytic lesions seen in the ribcage. IMPRESSION: No skeletal lytic lesions. Multiple augmented thoracic and lumbar fractures. No new or progressive fractures. Newly seen bilateral lower lobe atelectasis and or pneumonia. Electronically Signed   By: Nelson Chimes M.D.   On: 08/18/2020 15:14    Scheduled Meds:  (feeding supplement) PROSource Plus  30 mL Oral Daily   sodium chloride   Intravenous Once   feeding supplement (NEPRO CARB STEADY)  1,000 mL Per Tube Q24H   feeding supplement (NEPRO CARB STEADY)  237 mL Oral Q24H   feeding supplement (PROSource TF)  45 mL Per Tube Daily   finasteride  5 mg Oral Daily   free water  50 mL Per Tube 6 X Daily   insulin aspart  0-20 Units Subcutaneous TID WC   insulin aspart  0-5 Units Subcutaneous QHS   lidocaine  1 patch Transdermal Q24H   magic mouthwash  10 mL Oral TID   tamsulosin  0.4 mg Oral QHS   Tbo-Filgrastim  480 mcg Subcutaneous q1800   Continuous Infusions:  lactated ringers 75 mL/hr at 08/17/20 2209   meropenem (MERREM) IV 1 g (08/18/20 0946)   promethazine (PHENERGAN) injection (IM or IVPB)       LOS: 7 days   Time spent: 40 minutes. More than 50% of the time was spent in  counseling/coordination of care  Lorella Nimrod, MD [facility]  If 7PM-7AM, please contact night-coverage [email or web address]  08/18/2020, 3:41 PM   This record has been created using Systems analyst. Errors have been sought and corrected,but may not always be located. Such creation errors do not reflect on the standard of care.

## 2020-08-18 NOTE — Progress Notes (Signed)
Palliative care brief note  I saw and examined Mr. Jeff Rodriguez today.  He affirmed continued desire for aggressive care including full code status and dialysis if needed.  Full consult note to follow.  Micheline Rough, MD [place] [facility] [phone]

## 2020-08-18 NOTE — Plan of Care (Signed)

## 2020-08-19 ENCOUNTER — Inpatient Hospital Stay (HOSPITAL_COMMUNITY): Payer: No Typology Code available for payment source

## 2020-08-19 DIAGNOSIS — D696 Thrombocytopenia, unspecified: Secondary | ICD-10-CM

## 2020-08-19 DIAGNOSIS — U071 COVID-19: Secondary | ICD-10-CM | POA: Diagnosis not present

## 2020-08-19 DIAGNOSIS — I10 Essential (primary) hypertension: Secondary | ICD-10-CM | POA: Diagnosis not present

## 2020-08-19 DIAGNOSIS — R7989 Other specified abnormal findings of blood chemistry: Secondary | ICD-10-CM

## 2020-08-19 DIAGNOSIS — E118 Type 2 diabetes mellitus with unspecified complications: Secondary | ICD-10-CM | POA: Diagnosis not present

## 2020-08-19 DIAGNOSIS — N189 Chronic kidney disease, unspecified: Secondary | ICD-10-CM

## 2020-08-19 DIAGNOSIS — G9341 Metabolic encephalopathy: Secondary | ICD-10-CM

## 2020-08-19 DIAGNOSIS — N179 Acute kidney failure, unspecified: Secondary | ICD-10-CM | POA: Diagnosis not present

## 2020-08-19 LAB — LACTATE DEHYDROGENASE: LDH: 164 U/L (ref 98–192)

## 2020-08-19 LAB — GLUCOSE, CAPILLARY
Glucose-Capillary: 149 mg/dL — ABNORMAL HIGH (ref 70–99)
Glucose-Capillary: 173 mg/dL — ABNORMAL HIGH (ref 70–99)
Glucose-Capillary: 135 mg/dL — ABNORMAL HIGH (ref 70–99)
Glucose-Capillary: 89 mg/dL (ref 70–99)
Glucose-Capillary: 101 mg/dL — ABNORMAL HIGH (ref 70–99)
Glucose-Capillary: 121 mg/dL — ABNORMAL HIGH (ref 70–99)

## 2020-08-19 LAB — CBC WITH DIFFERENTIAL/PLATELET
Abs Immature Granulocytes: 0.01 10*3/uL (ref 0.00–0.07)
Basophils Absolute: 0 10*3/uL (ref 0.0–0.1)
Basophils Relative: 1 %
Eosinophils Absolute: 0.1 10*3/uL (ref 0.0–0.5)
Eosinophils Relative: 8 %
HCT: 22.4 % — ABNORMAL LOW (ref 39.0–52.0)
Hemoglobin: 7.5 g/dL — ABNORMAL LOW (ref 13.0–17.0)
Immature Granulocytes: 1 %
Lymphocytes Relative: 35 %
Lymphs Abs: 0.6 10*3/uL — ABNORMAL LOW (ref 0.7–4.0)
MCH: 31.3 pg (ref 26.0–34.0)
MCHC: 33.5 g/dL (ref 30.0–36.0)
MCV: 93.3 fL (ref 80.0–100.0)
Monocytes Absolute: 0.2 10*3/uL (ref 0.1–1.0)
Monocytes Relative: 12 %
Neutro Abs: 0.8 10*3/uL — ABNORMAL LOW (ref 1.7–7.7)
Neutrophils Relative %: 43 %
Platelets: 19 10*3/uL — CL (ref 150–400)
RBC: 2.4 MIL/uL — ABNORMAL LOW (ref 4.22–5.81)
RDW: 17.7 % — ABNORMAL HIGH (ref 11.5–15.5)
WBC: 1.7 10*3/uL — ABNORMAL LOW (ref 4.0–10.5)
nRBC: 0 % (ref 0.0–0.2)

## 2020-08-19 LAB — RENAL FUNCTION PANEL
Albumin: 1.8 g/dL — ABNORMAL LOW (ref 3.5–5.0)
Anion gap: 13 (ref 5–15)
BUN: 95 mg/dL — ABNORMAL HIGH (ref 8–23)
CO2: 21 mmol/L — ABNORMAL LOW (ref 22–32)
Calcium: 6.8 mg/dL — ABNORMAL LOW (ref 8.9–10.3)
Chloride: 98 mmol/L (ref 98–111)
Creatinine, Ser: 7.08 mg/dL — ABNORMAL HIGH (ref 0.61–1.24)
GFR, Estimated: 8 mL/min — ABNORMAL LOW (ref >60)
Glucose, Bld: 101 mg/dL — ABNORMAL HIGH (ref 70–99)
Phosphorus: 4.6 mg/dL (ref 2.5–4.6)
Potassium: 3.3 mmol/L — ABNORMAL LOW (ref 3.5–5.1)
Sodium: 132 mmol/L — ABNORMAL LOW (ref 135–145)

## 2020-08-19 LAB — KAPPA/LAMBDA LIGHT CHAINS
Kappa free light chain: 164.3 mg/L — ABNORMAL HIGH (ref 3.3–19.4)
Kappa, lambda light chain ratio: 1.04 (ref 0.26–1.65)
Lambda free light chains: 157.4 mg/L — ABNORMAL HIGH (ref 5.7–26.3)

## 2020-08-19 LAB — COMPREHENSIVE METABOLIC PANEL
ALT: 14 U/L (ref 0–44)
AST: 16 U/L (ref 15–41)
Albumin: 1.8 g/dL — ABNORMAL LOW (ref 3.5–5.0)
Alkaline Phosphatase: 38 U/L (ref 38–126)
Anion gap: 11 (ref 5–15)
BUN: 95 mg/dL — ABNORMAL HIGH (ref 8–23)
CO2: 23 mmol/L (ref 22–32)
Calcium: 6.6 mg/dL — ABNORMAL LOW (ref 8.9–10.3)
Chloride: 97 mmol/L — ABNORMAL LOW (ref 98–111)
Creatinine, Ser: 7.13 mg/dL — ABNORMAL HIGH (ref 0.61–1.24)
GFR, Estimated: 7 mL/min — ABNORMAL LOW (ref >60)
Glucose, Bld: 146 mg/dL — ABNORMAL HIGH (ref 70–99)
Potassium: 3.2 mmol/L — ABNORMAL LOW (ref 3.5–5.1)
Sodium: 131 mmol/L — ABNORMAL LOW (ref 135–145)
Total Bilirubin: 0.6 mg/dL (ref 0.3–1.2)
Total Protein: 5.1 g/dL — ABNORMAL LOW (ref 6.5–8.1)

## 2020-08-19 LAB — BLOOD GAS, ARTERIAL
Acid-Base Excess: 0 mmol/L (ref 0.0–2.0)
Bicarbonate: 23.3 mmol/L (ref 20.0–28.0)
O2 Saturation: 95.2 %
Patient temperature: 98.6
pCO2 arterial: 34 mmHg (ref 32.0–48.0)
pH, Arterial: 7.451 — ABNORMAL HIGH (ref 7.350–7.450)
pO2, Arterial: 76.5 mmHg — ABNORMAL LOW (ref 83.0–108.0)

## 2020-08-19 LAB — MRSA PCR SCREENING: MRSA by PCR: NEGATIVE

## 2020-08-19 LAB — C-REACTIVE PROTEIN: CRP: 10 mg/dL — ABNORMAL HIGH (ref <1.0)

## 2020-08-19 LAB — MAGNESIUM: Magnesium: 2.1 mg/dL (ref 1.7–2.4)

## 2020-08-19 LAB — PREPARE RBC (CROSSMATCH)

## 2020-08-19 LAB — PROTIME-INR
INR: 1.2 (ref 0.8–1.2)
Prothrombin Time: 15 seconds (ref 11.4–15.2)

## 2020-08-19 LAB — PHOSPHORUS: Phosphorus: 3.9 mg/dL (ref 2.5–4.6)

## 2020-08-19 MED ORDER — PRISMASOL BGK 4/2.5 32-4-2.5 MEQ/L REPLACEMENT SOLN: Status: DC

## 2020-08-19 MED ORDER — ALTEPLASE 2 MG IJ SOLR
2.0000 mg | Freq: Once | INTRAMUSCULAR | Status: DC | PRN
  Filled 2020-08-19: qty 2

## 2020-08-19 MED ORDER — CALCIUM CARBONATE 1250 (500 CA) MG PO TABS
1000.0000 mg | ORAL_TABLET | Freq: Two times a day (BID) | ORAL | Status: DC
  Administered 2020-08-20: 1000 mg via ORAL
  Filled 2020-08-19: qty 1

## 2020-08-19 MED ORDER — CHLORHEXIDINE GLUCONATE 0.12 % MT SOLN
15.0000 mL | Freq: Two times a day (BID) | OROMUCOSAL | Status: DC
  Administered 2020-08-19 – 2020-09-11 (×25): 15 mL via OROMUCOSAL
  Filled 2020-08-19 (×32): qty 15

## 2020-08-19 MED ORDER — ORAL CARE MOUTH RINSE
15.0000 mL | Freq: Two times a day (BID) | OROMUCOSAL | Status: DC
  Administered 2020-08-20 – 2020-09-11 (×18): 15 mL via OROMUCOSAL

## 2020-08-19 MED ORDER — HEPARIN SODIUM (PORCINE) 1000 UNIT/ML DIALYSIS
1000.0000 [IU] | INTRAMUSCULAR | Status: DC | PRN
  Administered 2020-08-19: 1000 [IU] via INTRAVENOUS_CENTRAL
  Administered 2020-08-20 (×2): 1200 [IU] via INTRAVENOUS_CENTRAL
  Filled 2020-08-19 (×4): qty 6

## 2020-08-19 MED ORDER — CHLORHEXIDINE GLUCONATE CLOTH 2 % EX PADS
6.0000 | MEDICATED_PAD | Freq: Every day | CUTANEOUS | Status: DC
  Administered 2020-08-19 – 2020-09-11 (×22): 6 via TOPICAL

## 2020-08-19 MED ORDER — PRISMASOL BGK 4/2.5 32-4-2.5 MEQ/L EC SOLN: Status: DC

## 2020-08-19 MED ORDER — SODIUM CHLORIDE 0.9 % IV SOLN
20.0000 ug | Freq: Once | INTRAVENOUS | Status: AC
  Administered 2020-08-19: 20 ug via INTRAVENOUS
  Filled 2020-08-19: qty 5

## 2020-08-19 MED ORDER — SODIUM CHLORIDE 0.9 % IV SOLN
INTRAVENOUS | Status: DC | PRN
  Administered 2020-08-19: 500 mL via INTRAVENOUS
  Administered 2020-09-09: 250 mL via INTRAVENOUS

## 2020-08-19 MED ORDER — SODIUM CHLORIDE 0.9% IV SOLUTION: Freq: Once | INTRAVENOUS | Status: AC

## 2020-08-19 MED ORDER — SODIUM CHLORIDE 0.9 % FOR CRRT: INTRAVENOUS_CENTRAL | Status: DC | PRN

## 2020-08-19 NOTE — Progress Notes (Signed)
PT Cancellation Note  Patient Details Name: Jeff Rodriguez MRN: [id number] DOB: 1946/01/09   Cancelled Treatment:    Reason Eval/Treat Not Completed: plan is for xfer to [facility] for HD. Will continue to follow as able at [facility].    [facility]  Office: [phone] Pager: [phone]

## 2020-08-19 NOTE — Progress Notes (Signed)
PROGRESS NOTE    Jeff Rodriguez  BXU:[id number] DOB: Jan 31, 1946 DOA: 08/11/2020 PCP: [facility]   Brief Narrative: Taken from prior notes. Jeff Rodriguez is a 75 y.o. male with medical history significant of multiple myeloma (on [name]), diabetes, hypertension, asthma, hyperlipidemia and osteoarthritis who was brought in by EMS secondary to productive cough, poor oral intake and not eating much.  Patient is currently undergoing chemo for his multiple myeloma. Patient was a poor historian on admission.  He lives alone with 1 son living locally and another son that calls to check on him consistently.  He is a patient of the [facility] and managed there in regards to his multiple myeloma.   He underwent work-up in the ER and tested positive for COVID-19.  He was also found to be hyponatremic, hypokalemic, and significant renal failure.  He was started on fluids and admitted for further work-up.  Creatinine remained elevated, some improvement in urinary output.  Remains very weak and lethargic.  NG tube was placed and he was started on tube feed as appetite remains poor.  Worsening cell counts, neutropenia and thrombocytopenia, oncology ordered Neupogen. Bone scan done today, 08/18/2020  was negative for any new lytic bone lesions.  Patient is currently full code, very high risk for deterioration and death.  Palliative care was consulted, patient decided to continue aggressive management including dialysis at this time.  Patient need to be transferred to [facility] to start dialysis-bed request was done, still pending as there is no availability due to staff shortage.  Subjective: Patient appears more somnolent and lethargic when seen today.  Able to answer questions appropriately and following commands.  Denies any nausea or vomiting.  No pain.  Assessment & Plan:   Principal Problem:   Acute renal failure superimposed on stage 3b chronic kidney disease  ([place]) Active Problems:   Hyperlipidemia   Diabetes mellitus with complication ([place])   Essential hypertension   COVID-19 virus infection   Hypokalemia   Pancytopenia (HCC)   Multiple myeloma (HCC)   Failure to thrive in adult  AKI with CKD stage IIIb.  Baseline creatinine appears to be around 1.7-2.  Creatinine remained above 7.  Nephrology is on board-appreciate their help.  Some improvement in urinary output. Differential includes ATN, prerenal from poor p.o. intake, myeloma kidney and he was also on [name] for a long time. Patient appears more somnolent, although no other uremic signs.  ABG with no CO2 retention, mild hypoxia, increased somnolent might be due to uremia. Patient needs to be started on dialysis ASAP. Bed placement request was done-still pending as there is no bed availability due to staff shortage. Nephrology is on board-appreciate their help. -CT head as patient has significant thrombocytopenia, no obvious bleeding and there is a drop in hemoglobin today. -Monitor renal function -Avoid nephrotoxins  Pancytopenia.  S/p 2 units of PRBC.  Hemoglobin dropped to 7.5.  Remained afebrile. Mild improvement in leukocyte count with little worsening of thrombocytopenia. He was started on broad-spectrum antibiotics with meropenem which he received for 5 days with some concern of UTI, urine cultures with multiple flora.  Meropenem was discontinued yesterday. -2 unit of PRBC ordered -Hematology is on board and they start him on Neupogen yesterday. -Monitor cell count.  Failure to thrive.  Most likely multiple factorial with underlying malignancy, recent COVID infection and renal failure. He was started on tube feed-started on 08/17/2020 Prognosis very guarded-patient wishes to stay full code. High risk for refeeding syndrome. -  Continue with tube feed -Monitor electrolytes  COVID-19 infection.  On room air. Completed the course of remdesivir. -Continue with supportive care as  needed.  Multiple myeloma. -[name] is on hold. -Bone scan without any new lytic lesions today. -Oncology is on board -Repeat flow cytometry was ordered--pending results.  Electrolyte abnormalities.  Some improvement in sodium, potassium and calcium today. -Replete electrolyte as needed -Start him on calcium supplement -Monitor sodium.  Essential hypertension.  Blood pressure within goal. -Continue current management.  Diabetes mellitus. -Continue with SSI  Objective: Vitals:   08/19/20 0451 08/19/20 1357 08/19/20 1414 08/19/20 1433  BP: 121/63 122/61 122/63 (!) 121/56  Pulse: 89 72 72 70  Resp: $Remo'20 19 20 18  'zwERV$ Temp: 98.2 F (36.8 C) 98.8 F (37.1 C) 98.7 F (37.1 C) 98.5 F (36.9 C)  TempSrc: Oral Oral Axillary Oral  SpO2: 94% 98% 99% 97%  Weight: 75.9 kg     Height:        Intake/Output Summary (Last 24 hours) at 08/19/2020 1520 Last data filed at 08/19/2020 1414 Gross per 24 hour  Intake 2226.17 ml  Output 1250 ml  Net 976.17 ml    Filed Weights   08/11/20 2245 08/18/20 0411 08/19/20 0451  Weight: 63.1 kg 75.5 kg 75.9 kg    Examination:  General.  Lethargic elderly man, in no acute distress, open eyes when called his name and following commands. Pulmonary.  Lungs clear bilaterally, normal respiratory effort. CV.  Regular rate and rhythm, no JVD, rub or murmur. Abdomen.  Soft, nontender, nondistended, BS positive. CNS.  Somnolent, following commands, no apparent focal deficit. Extremities.  No edema, no cyanosis, pulses intact and symmetrical. Psychiatry.  Judgment and insight appears impaired.  DVT prophylaxis: SCDs, patient has thrombocytopenia Code Status: Full Family Communication: Son was updated on phone. Disposition Plan:  Status is: Inpatient  Remains inpatient appropriate because:Inpatient level of care appropriate due to severity of illness  Dispo: The patient is from: Home              Anticipated d/c is to: Home              Patient  currently is not medically stable to d/c.   Difficult to place patient No              Level of care: Progressive  All the records are reviewed and case discussed with Care Management/Social Worker. Management plans discussed with the patient, nursing and they are in agreement.  Consultants:  Nephrology Oncology Palliative care medicine  Procedures:  Antimicrobials:   Data Reviewed: I have personally reviewed following labs and imaging studies  CBC: Recent Labs  Lab 08/13/20 0400 08/13/20 1207 08/14/20 0328 08/15/20 0417 08/16/20 0326 08/17/20 0312 08/18/20 0316 08/19/20 0346  WBC 2.3*  --  2.8* 1.6* 1.1* 1.2* 1.3* 1.7*  NEUTROABS 1.5*  --  1.6*  --  0.4*  --  0.4* 0.8*  HGB 6.0*   < > 7.8* 8.2* 7.9* 8.9* 8.4* 7.5*  HCT 17.9*   < > 22.6* 23.2* 22.8* 25.8* 25.3* 22.4*  MCV 97.3  --  90.8 90.6 91.9 91.5 94.4 93.3  PLT 60*  --  43* 29* 28* 27* 23* 19*   < > = values in this interval not displayed.    Basic Metabolic Panel: Recent Labs  Lab 08/15/20 0417 08/16/20 0326 08/17/20 0312 08/18/20 0316 08/19/20 0346  NA 129* 129* 130* 129* 131*  K 2.9* 3.0* 3.7 3.4* 3.2*  CL 89*  89* 94* 95* 97*  CO2 $Re'26 25 24 23 23  'inw$ GLUCOSE 111* 127* 125* 140* 146*  BUN 86* 95* 97* 96* 95*  CREATININE 7.47* 7.21* 7.07* 7.16* 7.13*  CALCIUM 5.9* 6.1* 6.2* 6.0* 6.6*  MG 1.6* 1.9 2.4 2.3 2.1  PHOS 4.4 4.0 2.9 3.3 3.9    GFR: Estimated Creatinine Clearance: 8.8 mL/min (A) (by C-G formula based on SCr of 7.13 mg/dL (H)). Liver Function Tests: Recent Labs  Lab 08/15/20 0417 08/16/20 0326 08/17/20 0312 08/18/20 0316 08/19/20 0346  AST $Re'25 20 18 15 16  'Qkr$ ALT $R'22 19 16 14 14  'HD$ ALKPHOS 39 38 38 37* 38  BILITOT 0.5 0.5 0.6 0.5 0.6  PROT 4.9* 4.9* 5.1* 4.9* 5.1*  ALBUMIN 2.0* 1.9* 1.8* 1.9* 1.8*    No results for input(s): LIPASE, AMYLASE in the last 168 hours. No results for input(s): AMMONIA in the last 168 hours. Coagulation Profile: No results for input(s): INR, PROTIME in the  last 168 hours. Cardiac Enzymes: No results for input(s): CKTOTAL, CKMB, CKMBINDEX, TROPONINI in the last 168 hours. BNP (last 3 results) No results for input(s): PROBNP in the last 8760 hours. HbA1C: No results for input(s): HGBA1C in the last 72 hours. CBG: Recent Labs  Lab 08/18/20 2009 08/18/20 2353 08/19/20 0444 08/19/20 0813 08/19/20 1228  GLUCAP 134* 116* 149* 173* 135*    Lipid Profile: No results for input(s): CHOL, HDL, LDLCALC, TRIG, CHOLHDL, LDLDIRECT in the last 72 hours. Thyroid Function Tests: No results for input(s): TSH, T4TOTAL, FREET4, T3FREE, THYROIDAB in the last 72 hours. Anemia Panel: No results for input(s): VITAMINB12, FOLATE, FERRITIN, TIBC, IRON, RETICCTPCT in the last 72 hours.  Sepsis Labs: Recent Labs  Lab 08/15/20 0417 08/16/20 0326 08/17/20 0312  PROCALCITON 1.12 1.03 0.71     Recent Results (from the past 240 hour(s))  Resp Panel by RT-PCR (Flu A&B, Covid) Nasopharyngeal Swab     Status: Abnormal   Collection Time: 08/11/20  3:50 PM   Specimen: Nasopharyngeal Swab; Nasopharyngeal(NP) swabs in vial transport medium  Result Value Ref Range Status   SARS Coronavirus 2 by RT PCR POSITIVE (A) NEGATIVE Final    Comment: RESULT CALLED TO, READ BACK BY AND VERIFIED WITH: M.BELFI AT 1949 ON 06.21.22 BY N.THOMPSON (NOTE) SARS-CoV-2 target nucleic acids are DETECTED.  The SARS-CoV-2 RNA is generally detectable in upper respiratory specimens during the acute phase of infection. Positive results are indicative of the presence of the identified virus, but do not rule out bacterial infection or co-infection with other pathogens not detected by the test. Clinical correlation with patient history and other diagnostic information is necessary to determine patient infection status. The expected result is Negative.  Fact Sheet for Patients: EntrepreneurPulse.com.au  Fact Sheet for Healthcare  Providers: IncredibleEmployment.be  This test is not yet approved or cleared by the [place] FDA and  has been authorized for detection and/or diagnosis of SARS-CoV-2 by FDA under an Emergency Use Authorization (EUA).  This EUA will remain in effect (meaning this tes t can be used) for the duration of  the COVID-19 declaration under Section 564(b)(1) of the Act, 21 U.S.C. section 360bbb-3(b)(1), unless the authorization is terminated or revoked sooner.     Influenza A by PCR NEGATIVE NEGATIVE Final   Influenza B by PCR NEGATIVE NEGATIVE Final    Comment: (NOTE) The Xpert Xpress SARS-CoV-2/FLU/RSV plus assay is intended as an aid in the diagnosis of influenza from Nasopharyngeal swab specimens and should not be used as a  sole basis for treatment. Nasal washings and aspirates are unacceptable for Xpert Xpress SARS-CoV-2/FLU/RSV testing.  Fact Sheet for Patients: EntrepreneurPulse.com.au  Fact Sheet for Healthcare Providers: IncredibleEmployment.be  This test is not yet approved or cleared by the [place] FDA and has been authorized for detection and/or diagnosis of SARS-CoV-2 by FDA under an Emergency Use Authorization (EUA). This EUA will remain in effect (meaning this test can be used) for the duration of the COVID-19 declaration under Section 564(b)(1) of the Act, 21 U.S.C. section 360bbb-3(b)(1), unless the authorization is terminated or revoked.  Performed at [facility], [place] [address], [place], [place] [address]   Blood Culture (routine x 2)     Status: None   Collection Time: 08/11/20  3:50 PM   Specimen: BLOOD  Result Value Ref Range Status   Specimen Description   Final    BLOOD BLOOD RIGHT FOREARM Performed at [facility] [address] [place], [place] [address]    Special Requests   Final    BOTTLES DRAWN AEROBIC AND ANAEROBIC Blood Culture adequate  volume Performed at [facility] [address] [place], [place] [address]    Culture   Final    NO GROWTH 5 DAYS Performed at [facility], [place] [address] [place], [place] [address]    Report Status 08/16/2020 FINAL  Final  Blood Culture (routine x 2)     Status: None   Collection Time: 08/11/20  3:55 PM   Specimen: BLOOD  Result Value Ref Range Status   Specimen Description   Final    BLOOD BLOOD LEFT FOREARM Performed at [facility] [address] [place], [place] [address]    Special Requests   Final    BOTTLES DRAWN AEROBIC AND ANAEROBIC Blood Culture adequate volume Performed at [facility] [address], [place], [place] [address]    Culture   Final    NO GROWTH 5 DAYS Performed at [facility], [place] [address], [place], [place] [address]    Report Status 08/16/2020 FINAL  Final  Culture, Urine     Status: Abnormal   Collection Time: 08/14/20  3:11 PM   Specimen: Urine, Random  Result Value Ref Range Status   Specimen Description   Final    URINE, RANDOM Performed at [facility] [address] [place], [place] [address]    Special Requests   Final    NONE Performed at [facility], [place] [address] [place], [place] [address]    Culture MULTIPLE SPECIES PRESENT, SUGGEST RECOLLECTION (A)  Final   Report Status 08/15/2020 FINAL  Final      Radiology Studies: DG Bone Survey Met  Result Date: 08/18/2020 CLINICAL DATA:  History of myeloma. EXAM: METASTATIC BONE SURVEY COMPARISON:  Various previous imaging studies. FINDINGS: Lateral skull: No lytic lesion. Right shoulder: No lytic lesion.  Chronic degenerative changes. Left shoulder: No lytic lesion.  Chronic degenerative changes. Left humerus: No lytic lesion. Right humerus: No lytic lesion. Right forearm: No lytic lesion. Left forearm: No lytic lesion. Cervical spine: Previous anterior and posterior  fusion. No evidence of active lytic lesion. Thoracic spine two views: Distant augmentation at T10 and T12. No lytic lesion or fracture seen otherwise. Lumbar spine two views: Previous augmentation at L1, L2, L3 and L5. No sign of lytic lesion. No progressive collapse. AP pelvis one view: No lytic lesion. Previous hip replacement on the  right. Left hip: No lytic lesion Left femur: No lytic lesion.  Previous knee replacement. Right femur: No lytic lesion. Previous hip replacement and knee replacement. Right hip: No lytic lesion.  Previous hip replacement. Right tib fib: No lytic lesion. Left hip fib: No lytic lesion. AP chest one view: Soft feeding tube enters the abdomen. Atelectasis or infiltrate in both lung bases, new since the study 1 week ago. No lytic lesions seen in the ribcage. IMPRESSION: No skeletal lytic lesions. Multiple augmented thoracic and lumbar fractures. No new or progressive fractures. Newly seen bilateral lower lobe atelectasis and or pneumonia. Electronically Signed   By: Nelson Chimes M.D.   On: 08/18/2020 15:14    Scheduled Meds:  (feeding supplement) PROSource Plus  30 mL Oral Daily   sodium chloride   Intravenous Once   calcium carbonate  1,000 mg of elemental calcium Oral BID WC   Chlorhexidine Gluconate Cloth  6 each Topical Q0600   feeding supplement (NEPRO CARB STEADY)  1,000 mL Per Tube Q24H   feeding supplement (NEPRO CARB STEADY)  237 mL Oral Q24H   feeding supplement (PROSource TF)  45 mL Per Tube Daily   finasteride  5 mg Oral Daily   free water  50 mL Per Tube 6 X Daily   insulin aspart  0-20 Units Subcutaneous TID WC   insulin aspart  0-5 Units Subcutaneous QHS   lidocaine  1 patch Transdermal Q24H   magic mouthwash  10 mL Oral TID   tamsulosin  0.4 mg Oral QHS   Tbo-Filgrastim  480 mcg Subcutaneous q1800   Continuous Infusions:  promethazine (PHENERGAN) injection (IM or IVPB)       LOS: 8 days   Time spent: 45 minutes. More than 50% of the time was spent  in counseling/coordination of care  Lorella Nimrod, MD [facility]  If 7PM-7AM, please contact night-coverage [email or web address]  08/19/2020, 3:20 PM   This record has been created using Systems analyst. Errors have been sought and corrected,but may not always be located. Such creation errors do not reflect on the standard of care.

## 2020-08-19 NOTE — Procedures (Signed)
Central Venous Catheter Insertion Procedure Note  JOSHAU CODE  [id number]  [birth year]  Date:08/19/20  Time:7:00 PM   Provider Performing:Bryley Kovacevic Naomie Dean   Procedure: Insertion of Non-tunneled Central Venous Catheter(36556)with US guidance (82883)    Indication(s) Hemodialysis  Consent Risks of the procedure as well as the alternatives and risks of each were explained to the patient and/or caregiver.  Consent for the procedure was obtained and is signed in the bedside chart  Anesthesia Topical only with 1% lidocaine   Timeout Verified patient identification, verified procedure, site/side was marked, verified correct patient position, special equipment/implants available, medications/allergies/relevant history reviewed, required imaging and test results available.  Sterile Technique Maximal sterile technique including full sterile barrier drape, hand hygiene, sterile gown, sterile gloves, mask, hair covering, sterile ultrasound probe cover (if used).  Procedure Description Area of catheter insertion was cleaned with chlorhexidine and draped in sterile fashion.   With real-time ultrasound guidance a HD catheter was placed into the right internal jugular vein.  Guidewire confirmed in vein prior to vessel dilation. Nonpulsatile blood flow and easy flushing noted in all ports.  The catheter was sutured in place and sterile dressing applied.  Complications/Tolerance None; patient tolerated the procedure well. Chest X-ray is ordered to verify placement for internal jugular or subclavian cannulation.  Chest x-ray is not ordered for femoral cannulation.  EBL Minimal Platelets transfused at the end of the procedure. EBL <10cc  Specimen(s) None  Julian Hy, DO 08/19/20 7:01 PM [facility]

## 2020-08-19 NOTE — Progress Notes (Signed)
Spoke to son [name], who gave verbal consent over the phone for dialysis catheter placement. 2 Rns verified. Transfer order placed. Full consult note to follow.  Julian Hy, DO 08/19/20 4:18 PM [facility]

## 2020-08-19 NOTE — Progress Notes (Signed)
No beds currently available at [facility], I am concerned about pt's worsening lethargy and somnolence.  Have asked CCM to assess and place temp cath for CRRT at [facility], hold off on moving to [facility] until he is more stable.  Appreciate CCM assistance.   Kelly Splinter, MD 08/19/2020, 3:31 PM

## 2020-08-19 NOTE — Progress Notes (Signed)
Subjective:  no change creat, BUN, UOP ok, pt is lethargic    Objective Vital signs in last 24 hours: Vitals:   08/18/20 0411 08/18/20 1336 08/18/20 2015 08/19/20 0451  BP:  118/64 100/72 121/63  Pulse:  69 74 89  Resp:  $Remo'16 16 20  'mBaJu$ Temp:  98.2 F (36.8 C) 98.5 F (36.9 C) 98.2 F (36.8 C)  TempSrc:  Oral Oral Oral  SpO2:  95% 96% 94%  Weight: 75.5 kg   75.9 kg  Height:       Weight change: 0.4 kg  Intake/Output Summary (Last 24 hours) at 08/19/2020 8850 Last data filed at 08/19/2020 0600 Gross per 24 hour  Intake 2096.17 ml  Output 650 ml  Net 1446.17 ml     Assessment/ Plan: Pt is a 75 y.o. yo male with multiple myeloma who was admitted on 08/11/2020 with COVID-  A on CRT-  crt 1.7 in Jan 2022-  now 7- non oliguric  Assessment/Plan: 1. A on CRF-  possibly taking ACE-I prior to admit-  renal u/s neg for obstruction- urine shows 30 of prot-  6-10 RBC- pyuria but diff to interpret out of bag-  will order culture-  mult species-  urine does not seem obviously infected by visualization- UOP improving.  AKI Could be ATN from hypotension on ACE.  Myeloma kidney is also in the differential, also could be COVID related.  Initially pt was refusing dialysis, but now is open to it. IVF"s haven't improved his numbers. Will proceed w/ dialysis at [facility], transfer initiated per pmd. Will consult IR for temp HD cath.  2. HTN/volume-  wt's and I/O's are up 8-10 L/ kg, mild edema on exam.  Will dc IVF"s. 3. Anemia-  blood transfusion 6/23-  due to myeloma and also AKI-  stable to improved 4. Hypocalcemia-  corrected is around 7.7-  no sxms - no action needed -  got bolus during hosp 5. Hypokalemia- replace prn   Kelly Splinter, MD 08/19/2020, 9:58 AM       Labs: Basic Metabolic Panel: Recent Labs  Lab 08/17/20 0312 08/18/20 0316 08/19/20 0346  NA 130* 129* 131*  K 3.7 3.4* 3.2*  CL 94* 95* 97*  CO2 $Re'24 23 23  'TcG$ GLUCOSE 125* 140* 146*  BUN 97* 96* 95*  CREATININE 7.07* 7.16* 7.13*   CALCIUM 6.2* 6.0* 6.6*  PHOS 2.9 3.3 3.9    Liver Function Tests: Recent Labs  Lab 08/17/20 0312 08/18/20 0316 08/19/20 0346  AST $Re'18 15 16  'Cod$ ALT $R'16 14 14  'Vm$ ALKPHOS 38 37* 38  BILITOT 0.6 0.5 0.6  PROT 5.1* 4.9* 5.1*  ALBUMIN 1.8* 1.9* 1.8*    No results for input(s): LIPASE, AMYLASE in the last 168 hours. No results for input(s): AMMONIA in the last 168 hours. CBC: Recent Labs  Lab 08/15/20 0417 08/16/20 0326 08/17/20 0312 08/18/20 0316 08/19/20 0346  WBC 1.6* 1.1* 1.2* 1.3* 1.7*  NEUTROABS  --  0.4*  --  0.4* 0.8*  HGB 8.2* 7.9* 8.9* 8.4* 7.5*  HCT 23.2* 22.8* 25.8* 25.3* 22.4*  MCV 90.6 91.9 91.5 94.4 93.3  PLT 29* 28* 27* 23* 19*    Cardiac Enzymes: No results for input(s): CKTOTAL, CKMB, CKMBINDEX, TROPONINI in the last 168 hours. CBG: Recent Labs  Lab 08/18/20 1644 08/18/20 2009 08/18/20 2353 08/19/20 0444 08/19/20 0813  GLUCAP 136* 134* 116* 149* 173*     Iron Studies:  No results for input(s): IRON, TIBC, TRANSFERRIN, FERRITIN in the last 72  hours.  Studies/Results: DG Abd Portable 1V  Result Date: 08/17/2020 CLINICAL DATA:  Feeding tube EXAM: PORTABLE ABDOMEN - 1 VIEW COMPARISON:  04/24/2019 FINDINGS: Esophageal tube tip overlies distal stomach. Surgical clips in the right upper quadrant. Multiple treated compression deformities of the thoracolumbar spine. Mild air distended small bowel in the right abdomen up to 3.5 cm with scattered colon gas and more focally dilated probable colon in the pelvis. IMPRESSION: 1. Esophageal tube tip overlies distal stomach. 2. Mild air distension of small bowel in the right mid abdomen with scattered colon gas, question mild ileus Electronically Signed   By: Donavan Foil M.D.   On: 08/17/2020 14:12   DG Bone Survey Met  Result Date: 08/18/2020 CLINICAL DATA:  History of myeloma. EXAM: METASTATIC BONE SURVEY COMPARISON:  Various previous imaging studies. FINDINGS: Lateral skull: No lytic lesion. Right shoulder: No  lytic lesion.  Chronic degenerative changes. Left shoulder: No lytic lesion.  Chronic degenerative changes. Left humerus: No lytic lesion. Right humerus: No lytic lesion. Right forearm: No lytic lesion. Left forearm: No lytic lesion. Cervical spine: Previous anterior and posterior fusion. No evidence of active lytic lesion. Thoracic spine two views: Distant augmentation at T10 and T12. No lytic lesion or fracture seen otherwise. Lumbar spine two views: Previous augmentation at L1, L2, L3 and L5. No sign of lytic lesion. No progressive collapse. AP pelvis one view: No lytic lesion. Previous hip replacement on the right. Left hip: No lytic lesion Left femur: No lytic lesion.  Previous knee replacement. Right femur: No lytic lesion. Previous hip replacement and knee replacement. Right hip: No lytic lesion.  Previous hip replacement. Right tib fib: No lytic lesion. Left hip fib: No lytic lesion. AP chest one view: Soft feeding tube enters the abdomen. Atelectasis or infiltrate in both lung bases, new since the study 1 week ago. No lytic lesions seen in the ribcage. IMPRESSION: No skeletal lytic lesions. Multiple augmented thoracic and lumbar fractures. No new or progressive fractures. Newly seen bilateral lower lobe atelectasis and or pneumonia. Electronically Signed   By: Nelson Chimes M.D.   On: 08/18/2020 15:14   Medications: Infusions:  lactated ringers 40 mL/hr at 08/19/20 0600   promethazine (PHENERGAN) injection (IM or IVPB)      Scheduled Medications:  (feeding supplement) PROSource Plus  30 mL Oral Daily   sodium chloride   Intravenous Once   calcium carbonate  1,000 mg of elemental calcium Oral BID WC   feeding supplement (NEPRO CARB STEADY)  1,000 mL Per Tube Q24H   feeding supplement (NEPRO CARB STEADY)  237 mL Oral Q24H   feeding supplement (PROSource TF)  45 mL Per Tube Daily   finasteride  5 mg Oral Daily   free water  50 mL Per Tube 6 X Daily   insulin aspart  0-20 Units Subcutaneous TID  WC   insulin aspart  0-5 Units Subcutaneous QHS   lidocaine  1 patch Transdermal Q24H   magic mouthwash  10 mL Oral TID   tamsulosin  0.4 mg Oral QHS   Tbo-Filgrastim  480 mcg Subcutaneous q1800    have reviewed scheduled and prn medications.  Physical Exam: Gen alert, no distress. Very lethargic, answers simple questions No rash, cyanosis or gangrene Sclera anicteric, throat clear  No jvd or bruits Chest clear bilat to bases, no rales/ wheezing RRR no MRG Abd soft ntnd no mass or ascites +bs GU normal MS no joint effusions or deformity Ext 1+ edema LE"s. no wounds  or ulcers Neuro is lethargic, very weak

## 2020-08-19 NOTE — Consult Note (Signed)
Consultation Note Date: 08/19/2020   Patient Name: Jeff Rodriguez  DOB: March 27, 1945  MRN: [id number]  Age / Sex: 75 y.o., male  PCP: [facility] Referring Physician: Lorella Nimrod, MD  Reason for Consultation: Establishing goals of care  HPI/Patient Profile: 75 y.o. male  with past medical history of multiple myeloma, diabetes, hypertension, asthma, hyperlipidemia and osteoarthritis admitted on 08/11/2020 with AKI on CKD, pancytopenia, failure to thrive, and COVID-19 infection.  He has been evaluated by nephrology and noted need for consideration for dialysis.  Previously, patient had expressed that he would not be interested in dialysis, however, he is now stating that he is agreeable to trial of dialysis.  Palliative consulted for goals of care.   Clinical Assessment and Goals of Care: I met today with Jeff Rodriguez.   I introduced palliative care as specialized medical care for people living with serious illness. It focuses on providing relief from the symptoms and stress of a serious illness. The goal is to improve quality of life for both the patient and the family.  He tells me that the physicians have been doing a good job explaining things to him and he understands that he has renal failure.  He tells me that his wife has been on dialysis for years and he had at one point expressed that he would not want dialysis, but in talking with him further, he explained that this meant that he was hoping to not require dialysis not that he would never want dialysis if it meant it was necessary to prolong his life.  I asked him about the most important things to him and he said simply, "living."  We talked about his family and his sons being the most important thing to him.  We discussed clinical course as well as wishes moving forward in regard to advanced directives.  Concepts specific to code status and  care plan this hospitalization discussed.  We discussed difference between a aggressive medical intervention path and a palliative, comfort focused care path.  Values and goals of care important to patient and family were attempted to be elicited.   Following discussion, I then called and spoke with patient's son, Jeff Rodriguez.  Eric and I reviewed her clinical course as well and reviewed above conversation.  Discussed family's experience with dialysis and understanding that undergoing initiation of dialysis is not mean that it is something that needs to be continued if he is not gaining the benefits and quality of life that he had hoped for at the time he began dialysis treatment.  Eric expressed understanding and was thankful for conversation.  SUMMARY OF RECOMMENDATIONS   -Full code/full scope -Mr. Hoying is invested in any and all offered interventions, including trial of dialysis.  I did discuss with him and his son regarding reassessing in a couple of weeks to ensure he is gaining benefits he had hoped for from dialysis.  If so, I recommended they continue.  If not, I did talk with them about the fact that initiation of  dialysis does not mean that he cannot reevaluate his decision for dialysis at a later date if he is not enjoying the quality of life he was hoping for at the time he started dialysis.  Code Status/Advance Care Planning: Full code  Palliative Prophylaxis:  Bowel Regimen and Frequent Pain Assessment  Additional Recommendations (Limitations, Scope, Preferences): Full Scope Treatment  Psycho-social/Spiritual:  Desire for further Chaplaincy support: Not addressed today Additional Recommendations: Caregiving  Support/Resources  Prognosis:  Guarded  Discharge Planning: To Be Determined      Primary Diagnoses: Present on Admission:  Acute renal failure superimposed on stage 3b chronic kidney disease ([place])  COVID-19 virus infection  Diabetes mellitus with complication ([place])   Essential hypertension  Hyperlipidemia  Hypokalemia  Pancytopenia ([place])  Multiple myeloma ([place])   I have reviewed the medical record, interviewed the patient and family, and examined the patient. The following aspects are pertinent.  Past Medical History:  Diagnosis Date   Asthma    Diabetes mellitus    Hypertension    Social History   Socioeconomic History   Marital status: Widowed    Spouse name: Not on file   Number of children: Not on file   Years of education: Not on file   Highest education level: Not on file  Occupational History   Not on file  Tobacco Use   Smoking status: Never   Smokeless tobacco: Not on file  Substance and Sexual Activity   Alcohol use: No   Drug use: No   Sexual activity: Not on file  Other Topics Concern   Not on file  Social History Narrative   Not on file   Social Determinants of Health   Financial Resource Strain: Not on file  Food Insecurity: Not on file  Transportation Needs: Not on file  Physical Activity: Not on file  Stress: Not on file  Social Connections: Not on file   Family History  Problem Relation Age of Onset   Cancer - Lung Father    Deep vein thrombosis Mother    Scheduled Meds:  (feeding supplement) PROSource Plus  30 mL Oral Daily   sodium chloride   Intravenous Once   feeding supplement (NEPRO CARB STEADY)  1,000 mL Per Tube Q24H   feeding supplement (NEPRO CARB STEADY)  237 mL Oral Q24H   feeding supplement (PROSource TF)  45 mL Per Tube Daily   finasteride  5 mg Oral Daily   free water  50 mL Per Tube 6 X Daily   insulin aspart  0-20 Units Subcutaneous TID WC   insulin aspart  0-5 Units Subcutaneous QHS   lidocaine  1 patch Transdermal Q24H   magic mouthwash  10 mL Oral TID   tamsulosin  0.4 mg Oral QHS   Tbo-Filgrastim  480 mcg Subcutaneous q1800   Continuous Infusions:  lactated ringers 40 mL/hr at 08/19/20 0600   promethazine (PHENERGAN) injection (IM or IVPB)     PRN Meds:.acetaminophen,  albuterol, alum & mag hydroxide-simeth, chlorpheniramine-HYDROcodone, guaiFENesin-dextromethorphan, ondansetron **OR** ondansetron (ZOFRAN) IV, phenol, promethazine (PHENERGAN) injection (IM or IVPB) Medications Prior to Admission:  Prior to Admission medications   Medication Sig Start Date End Date Taking? Authorizing Provider  acetaminophen (TYLENOL) 500 MG tablet Take 500 mg by mouth 4 (four) times daily. 08/12/09  Yes [provider]  albuterol (VENTOLIN HFA) 108 (90 Base) MCG/ACT inhaler Inhale 2 puffs into the lungs every 6 (six) hours as needed for wheezing or shortness of breath. 03/13/20  Yes [provider]  amitriptyline (ELAVIL) 10 MG tablet Take 10 mg by mouth See admin instructions. Takes 1 tablet at supper and 3 tablets by mouth at bedtime .   Yes [provider]  Calcium Carb-Cholecalciferol 600-400 MG-UNIT TABS Take 1 tablet by mouth daily. 06/04/20  Yes [provider]  carvedilol (COREG) 12.5 MG tablet Take 0.5 tablets by mouth 2 (two) times daily with a meal. 06/16/20  Yes [provider]  dexamethasone (DECADRON) 4 MG tablet Take 20 mg by mouth once a week. 07/24/20  Yes [provider]  Ensure Plus (ENSURE PLUS) LIQD Take 237 mLs by mouth daily.   Yes [provider]  insulin glargine (LANTUS) 100 UNIT/ML injection Inject 0.2 mLs (20 Units total) into the skin at bedtime. Patient taking differently: Inject 10 Units into the skin daily. 04/25/15  Yes Delfina Redwood, MD  lenalidomide ([name]) 15 MG capsule Take 15 mg by mouth See admin instructions. Take 1 tablet by mouth for 21 days followed by 7 days drug free. 07/24/20  Yes [provider]  Menthol-Methyl Salicylate (THERA-GESIC) 0.5-15 % CREA Apply 1 application topically 3 (three) times daily. 07/28/20  Yes [provider]  metFORMIN (GLUCOPHAGE) 1000 MG tablet Take 1,000 mg by mouth 2 (two) times daily. 07/09/20  Yes [provider]   potassium chloride SA (KLOR-CON) 20 MEQ tablet Take 40 mEq by mouth 2 (two) times daily. 07/28/20  Yes [provider]  rosuvastatin (CRESTOR) 40 MG tablet Take 20 mg by mouth daily. 07/09/20  Yes [provider]  tamsulosin (FLOMAX) 0.4 MG CAPS capsule Take 0.8 mg by mouth at bedtime.    Yes [provider]  Zoledronic Acid 4 MG SOLR Inject 3.3 mg into the vein every 30 (thirty) days. 03/13/20  Yes [provider]  Oxycodone HCl 10 MG TABS Take 1 tablet (10 mg total) by mouth every 4 (four) hours as needed (for pain). Patient not taking: No sig reported 04/25/15   Kinsinger, Arta Bruce, MD   Allergies  Allergen Reactions   Shrimp [Shellfish Allergy] Anaphylaxis   Iodine Swelling   Sulfa Antibiotics Other (See Comments)    other   Cephalexin Rash and Hives   Gabapentin Rash   Oxacillin Rash   Review of Systems  Constitutional:  Positive for activity change, fatigue and unexpected weight change.  Neurological:  Positive for weakness.  Psychiatric/Behavioral:  Positive for sleep disturbance.    Physical Exam General: Weak, chronically ill appearing, NGT in place HEENT: No bruits, no goiter, no JVD Heart: Regular rate and rhythm. No murmur appreciated. Lungs: Good air movement, clear Abdomen: Soft, nontender, nondistended, positive bowel sounds.   Ext: No significant edema Skin: Warm and dry Neuro: Grossly intact, nonfocal.   Vital Signs: BP 121/63 (BP Location: Left Arm)   Pulse 89   Temp 98.2 F (36.8 C) (Oral)   Resp 20   Ht $R'5\' 8"'Hf$  (1.727 m)   Wt 75.9 kg   SpO2 94%   BMI 25.44 kg/m  Pain Scale: 0-10   Pain Score: 0-No pain   SpO2: SpO2: 94 % O2 Device:SpO2: 94 % O2 Flow Rate: .O2 Flow Rate (L/min): 0 L/min  IO: Intake/output summary:  Intake/Output Summary (Last 24 hours) at 08/19/2020 0806 Last data filed at 08/19/2020 0600 Gross per 24 hour  Intake 2456.17 ml  Output 650 ml  Net 1806.17 ml    LBM: Last BM Date:  08/18/20 Baseline Weight: Weight: 90.7 kg Most recent weight: Weight:  75.9 kg     Palliative Assessment/Data:   Flowsheet Rows    Flowsheet Row Most Recent Value  Intake Tab   Referral Department Hospitalist  Unit at Time of Referral Med/Surg Unit  Palliative Care Primary Diagnosis Cancer  Date Notified 08/16/20  Palliative Care Type New Palliative care  Reason for referral Clarify Goals of Care  Date of Admission 08/11/20  Date first seen by Palliative Care 08/18/20  # of days Palliative referral response time 2 Day(s)  # of days IP prior to Palliative referral 5  Clinical Assessment   Palliative Performance Scale Score 30%  Psychosocial & Spiritual Assessment   Palliative Care Outcomes   Patient/Family meeting held? Yes  Who was at the meeting? Patient, son via phone       Time In: 1700 Time Out: 1820 Time Total: 80 Greater than 50%  of this time was spent counseling and coordinating care related to the above assessment and plan.  Signed by: Micheline Rough, MD   Please contact Palliative Medicine Team phone at [phone] for questions and concerns.  For individual provider: See Shea Evans

## 2020-08-19 NOTE — Progress Notes (Signed)
Occupational Therapy Treatment Patient Details Name: Jeff Rodriguez MRN: [id number] DOB: 02-15-1946 Today's Date: 08/19/2020    History of present illness patient is a 75 year old male who was admitted to the hospital on 6/22 with COVID 19 and AKI. past medical history significant for multiple myeloma, diabetes mellitus, hypertension, asthma, hyperlipidemia, osteoarthritis.   OT comments  Patient lethargic, doesn't open his eyes predominantly during treatment. Is able to answer questions with short responses though his voice is very low and hard to head. Treatment focused on therapeutic exercise in bed to maintain ROM of extremities with active assist needed. Cont POC as patient reports he wants aggressive care though participation limited.      Follow Up Recommendations  SNF    Equipment Recommendations  Tub/shower seat;3 in 1 bedside commode    Recommendations for Other Services      Precautions / Restrictions Precautions Precautions: Fall Precaution Comments: cortrak Restrictions Weight Bearing Restrictions: No       Mobility Bed Mobility                    Transfers                      Balance                                           ADL either performed or assessed with clinical judgement   ADL                                               Vision       Perception     Praxis      Cognition                                                Exercises Other Exercises Other Exercises: Shoulder flexion with active assist x 5 each arm, elbow flexion x 5 each arm, fist pumps x 5 each arm, hip flexion x 5 each leg   Shoulder Instructions       General Comments      Pertinent Vitals/ Pain          Home Living                                          Prior Functioning/Environment              Frequency  Min 2X/week        Progress Toward  Goals  OT Goals(current goals can now be found in the care plan section)  Progress towards OT goals: Not progressing toward goals - comment (medical declnie)  Acute Rehab OT Goals Patient Stated Goal: for aggressive care OT Goal Formulation: With patient Time For Goal Achievement: 08/28/20 Potential to Achieve Goals: [place] Discharge plan remains appropriate    Co-evaluation                 AM-PAC OT "6 Clicks" Daily Activity  Outcome Measure   Help from another person eating meals?: A Lot Help from another person taking care of personal grooming?: A Lot Help from another person toileting, which includes using toliet, bedpan, or urinal?: Total Help from another person bathing (including washing, rinsing, drying)?: Total Help from another person to put on and taking off regular upper body clothing?: A Lot Help from another person to put on and taking off regular lower body clothing?: Total 6 Click Score: 9    End of Session    OT Visit Diagnosis: Muscle weakness (generalized) (M62.81)   Activity Tolerance Patient limited by fatigue;Patient limited by lethargy   Patient Left in bed;with call bell/phone within reach;with bed alarm set   Nurse Communication  (okay to see)        Time: 3406-8403 OT Time Calculation (min): 10 min  Charges: OT General Charges $OT Visit: 1 Visit OT Treatments $Therapeutic Exercise: 8-22 mins  Derl Barrow, OTR/L [place]  Office [phone] Pager: [place] 08/19/2020, 1:12 PM

## 2020-08-19 NOTE — Consult Note (Signed)
NAME:  Jeff Rodriguez, MRN:  [id number], DOB:  [birth year], LOS: 8 ADMISSION DATE:  08/11/2020, CONSULTATION DATE:  08/19/2020 REFERRING MD:  Dr. Adella Hare , CHIEF COMPLAINT:  Acute renal failure with need for CRRT  History of Present Illness:  History from chart review due to encephalopathy. Jeff Rodriguez is a 75 y.o. male with a PMH significant for multiple myeloma on [name], HTN, Diabetes, and Asthma who presented to the ED for complaints of productive cough, poor oral intake, and general malaise.  Work-up on admission revealed positive COVID-19 with associated hyponatremia, hypokalemia, and significant renal failure.  He was admitted per hospitalist service with nephrology consulted.  Afternoon of 6/29 patient was seen more somnolent c in the setting of underlying renal failure prompting nephrology to initiate renal replacement therapy.  Given limited bed availability at [facility] decision was made to initiate CRRT here at [facility] therefore PCCM was consulted for dialysis catheter placement and CRRT.  Pertinent  Medical History  Multiple myeloma on [name], HTN, Diabetes, and Asthma  Significant Hospital Events:  6/21 Admitted COVID positive 6/29 PCCM consulted for HD cath placement and CRRT   Interim History / Subjective:    Objective   Blood pressure (!) 121/56, pulse 70, temperature 98.5 F (36.9 C), temperature source Oral, resp. rate 18, height 5\' 8"  (1.727 m), weight 75.9 kg, SpO2 97 %.        Intake/Output Summary (Last 24 hours) at 08/19/2020 1542 Last data filed at 08/19/2020 1414 Gross per 24 hour  Intake 2226.17 ml  Output 1250 ml  Net 976.17 ml   Filed Weights   08/11/20 2245 08/18/20 0411 08/19/20 0451  Weight: 63.1 kg 75.5 kg 75.9 kg    Examination: General: ill appearing man lying in bed sleeping HEENT: [place]/AT, eyes anicteric, NG tube in place Neuro: Sleepy but arousable to verbal stimulation.  Moving all extremities but globally weak CV: S1-S2,  regular rate and rhythm PULM: Comfortably on room air, CTAB, speaking in complete sentences GI: Soft, nontender, nondistended Extremities: No clubbing, cyanosis, or edema. 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 Transfer to ICU for initiation of CRRT  Son provided consent over the phone for dialysis Strict I/O Trend Bmet Avoid nephrotoxins Ensure adequate renal perfusion  DDAVP before dialysis access to limit bleeding risk  Acute metabolic encephalopathy likely due to azotemia.  No hypercapnia. Head CT ordered, likely to be low yield Moving to the ICU for CVVHD Hold sedating meds  History of multiple myeloma -Was receiving [name] prior to admission, bone scan negative for any new lytic lesions Pancytopenia -Status post 2 units packed red blood cells -Status post 5-day treatment with IV meropenem P: Oncology/hematology following, appreciate assistance Trend CBC Transfuse platelets prior to placement of HD catheter Neupogen started 6/28 Monitor for signs of bleeding  COVID infection  -Patient reports initial COVID-vaccine but unclear if booster shot was received. -Completed course of Remdesivir  P: Remains on RA Supportive care   Hx of HTN/HLD -Home medications include; Coreg and Crestor  P: Resume home medications when appropriate  Continuous telemetry  Hx of diabetes  -Home medications include; Lantus and Metformin,  P: Home Metformin on hold  Continue SSI Goal BG 140-180 Continue tube feeds per RD recommendations   Best Practice    Diet/type: NPO DVT prophylaxis: systemic heparin GI prophylaxis: N/A Lines: Dialysis Catheter  pending  Foley:  N/A Code Status:  full code Last date of multidisciplinary goals of care discussion Pending. Appreciate Palliative Care team's input  Labs   CBC: Recent Labs  Lab 08/13/20 0400 08/13/20 1207 08/14/20 0328 08/15/20 0417 08/16/20 0326 08/17/20 0312 08/18/20 0316 08/19/20 0346  WBC 2.3*  --  2.8* 1.6* 1.1* 1.2* 1.3* 1.7*  NEUTROABS 1.5*  --  1.6*  --  0.4*  --  0.4* 0.8*  HGB 6.0*   < > 7.8* 8.2* 7.9* 8.9* 8.4* 7.5*  HCT 17.9*   < > 22.6* 23.2* 22.8* 25.8* 25.3* 22.4*  MCV 97.3  --  90.8 90.6 91.9 91.5 94.4 93.3  PLT 60*  --  43* 29* 28* 27* 23* 19*   < > = values in this interval not displayed.    Basic Metabolic Panel: Recent Labs  Lab 08/15/20 0417 08/16/20 0326 08/17/20 0312 08/18/20 0316 08/19/20 0346  NA 129* 129* 130* 129* 131*  K 2.9* 3.0* 3.7 3.4* 3.2*  CL 89* 89* 94* 95* 97*  CO2 $Re'26 25 24 23 23  'QYK$ GLUCOSE 111* 127* 125* 140* 146*  BUN 86* 95* 97* 96* 95*  CREATININE 7.47* 7.21* 7.07* 7.16* 7.13*  CALCIUM 5.9* 6.1* 6.2* 6.0* 6.6*  MG 1.6* 1.9 2.4 2.3 2.1  PHOS 4.4 4.0 2.9 3.3 3.9   GFR: Estimated Creatinine Clearance: 8.8 mL/min (A) (by C-G formula based on SCr of 7.13 mg/dL (H)). Recent Labs  Lab 08/15/20 0417 08/16/20 0326 08/17/20 0312 08/18/20 0316 08/19/20 0346  PROCALCITON 1.12 1.03 0.71  --   --   WBC 1.6* 1.1* 1.2* 1.3* 1.7*    Liver Function Tests: Recent Labs  Lab 08/15/20 0417 08/16/20 0326 08/17/20 0312 08/18/20 0316 08/19/20 0346  AST $Re'25 20 18 15 16  'oRi$ ALT $R'22 19 16 14 14  'Zy$ ALKPHOS 39 38 38 37* 38  BILITOT 0.5 0.5 0.6 0.5 0.6  PROT 4.9* 4.9* 5.1* 4.9* 5.1*  ALBUMIN 2.0* 1.9* 1.8* 1.9* 1.8*   No results for input(s): LIPASE, AMYLASE in the last 168 hours. No results for input(s): AMMONIA in the last 168 hours.  ABG    Component Value Date/Time   PHART 7.451 (H) 08/19/2020 1120   PCO2ART 34.0 08/19/2020 1120    PO2ART 76.5 (L) 08/19/2020 1120   HCO3 23.3 08/19/2020 1120   TCO2 24 04/20/2015 0538   O2SAT 95.2 08/19/2020 1120     Coagulation Profile: No results for input(s): INR, PROTIME in the last 168 hours.  Cardiac Enzymes: No results for input(s): CKTOTAL, CKMB, CKMBINDEX, TROPONINI in the last 168 hours.  HbA1C: Hgb A1c MFr Bld  Date/Time Value Ref Range Status  08/12/2020 03:38 AM 8.4 (H) 4.8 - 5.6 % Final    Comment:    (NOTE) Pre diabetes:          5.7%-6.4%  Diabetes:              >6.4%  Glycemic control for   <7.0% adults with diabetes   04/20/2015 09:55 AM 7.8 (H) 4.8 - 5.6 % Final    Comment:    (NOTE)         Pre-diabetes: 5.7 - 6.4  Diabetes: >6.4         Glycemic control for adults with diabetes: <7.0     CBG: Recent Labs  Lab 08/18/20 2009 08/18/20 2353 08/19/20 0444 08/19/20 0813 08/19/20 1228  GLUCAP 134* 116* 149* 173* 135*    Review of Systems:   ROS limited due to patient's mental status.  Past Medical History:  He,  has a past medical history of Asthma, Diabetes mellitus, and Hypertension.   Surgical History:   Past Surgical History:  Procedure Laterality Date   CHOLECYSTECTOMY N/A 04/23/2015   Procedure: LAPAROSCOPIC CHOLECYSTECTOMY WITH INTRAOPERATIVE CHOLANGIOGRAM;  Surgeon: Ralene Ok, MD;  Location: [place];  Service: General;  Laterality: N/A;   REPLACEMENT TOTAL KNEE Bilateral    TOTAL HIP ARTHROPLASTY Right      Social History:   reports that he does not drink alcohol and does not use drugs.   Family History:  His family history includes Cancer - Lung in his father; Deep vein thrombosis in his mother.   Allergies Allergies  Allergen Reactions   Shrimp [Shellfish Allergy] Anaphylaxis   Iodine Swelling   Sulfa Antibiotics Other (See Comments)    other   Cephalexin Rash and Hives   Gabapentin Rash   Oxacillin Rash     Home Medications  Prior to Admission medications   Medication Sig Start Date End Date  Taking? Authorizing Provider  acetaminophen (TYLENOL) 500 MG tablet Take 500 mg by mouth 4 (four) times daily. 08/12/09  Yes [provider]  albuterol (VENTOLIN HFA) 108 (90 Base) MCG/ACT inhaler Inhale 2 puffs into the lungs every 6 (six) hours as needed for wheezing or shortness of breath. 03/13/20  Yes [provider]  amitriptyline (ELAVIL) 10 MG tablet Take 10 mg by mouth See admin instructions. Takes 1 tablet at supper and 3 tablets by mouth at bedtime .   Yes [provider]  Calcium Carb-Cholecalciferol 600-400 MG-UNIT TABS Take 1 tablet by mouth daily. 06/04/20  Yes [provider]  carvedilol (COREG) 12.5 MG tablet Take 0.5 tablets by mouth 2 (two) times daily with a meal. 06/16/20  Yes [provider]  dexamethasone (DECADRON) 4 MG tablet Take 20 mg by mouth once a week. 07/24/20  Yes [provider]  Ensure Plus (ENSURE PLUS) LIQD Take 237 mLs by mouth daily.   Yes [provider]  insulin glargine (LANTUS) 100 UNIT/ML injection Inject 0.2 mLs (20 Units total) into the skin at bedtime. Patient taking differently: Inject 10 Units into the skin daily. 04/25/15  Yes Delfina Redwood, MD  lenalidomide ([name]) 15 MG capsule Take 15 mg by mouth See admin instructions. Take 1 tablet by mouth for 21 days followed by 7 days drug free. 07/24/20  Yes [provider]  Menthol-Methyl Salicylate (THERA-GESIC) 0.5-15 % CREA Apply 1 application topically 3 (three) times daily. 07/28/20  Yes [provider]  metFORMIN (GLUCOPHAGE) 1000 MG tablet Take 1,000 mg by mouth 2 (two) times daily. 07/09/20  Yes [provider]  potassium chloride SA (KLOR-CON) 20 MEQ tablet Take 40 mEq by mouth 2 (two) times daily. 07/28/20  Yes [provider]  rosuvastatin (CRESTOR) 40 MG tablet Take 20 mg by mouth daily. 07/09/20  Yes [provider]  tamsulosin (FLOMAX) 0.4 MG CAPS capsule Take 0.8 mg by mouth at bedtime.    Yes  [provider]  Zoledronic Acid 4 MG SOLR Inject 3.3 mg into the vein every 30 (thirty) days. 03/13/20  Yes [provider]  Oxycodone HCl 10 MG TABS Take 1 tablet (10 mg total) by mouth every 4 (four) hours as needed (for pain). Patient not taking: No sig reported 04/25/15   Kinsinger, Arta Bruce, MD     Critical care time:     This patient is critically ill with multiple organ system failure which requires frequent high complexity decision making, assessment, support, evaluation, and titration of therapies. This was completed through the application of advanced monitoring technologies and extensive interpretation of multiple databases. During this encounter critical care time was devoted to patient care services described in this note for 45 minutes.  Julian Hy, DO 08/19/20 5:15 PM [facility]

## 2020-08-19 NOTE — Progress Notes (Signed)
It looks like Jeff Rodriguez is going to be moved over to [name] and start dialysis.  He wants to be aggressive.  He still does not have a great performance status.  His blood counts show [name] at 1.7.  Hemoglobin 7.5.  Platelet count 19,000.  I selectively that we are looking at an issue with the [name] that he was taking and his renal insufficiency.  The [name] is just not being "washed out" from the system yet.  His BUN is 95 creatinine 7.13.  Calcium is 6.6 with an albumin of 1.8.  I still want to hold off on doing anything invasive.  Again we cannot be sure if this is myeloma causing the pancytopenia.  Again I find it hard to believe.  He had light chain studies done a week ago which were relatively unremarkable.  He had a slightly elevated kappa light chain but his lambda light chain was also elevated.  I would not think that a kidney biopsy will be done because of his overall performance status and the fact that he has marked thrombocytopenia.  We still may have to entertain a bone marrow biopsy if his blood counts do not improve.  I know he is on Neupogen right now.  His white cell count is trending upward.  Having been given Neupogen, this could also lower his platelet count.  He is quite anemic.  We will let the primary care team decide whether or not he needs to be transfused.  We will follow along.  For right now, there really is not much that we need to do.  Of note, his C-reactive protein is trending downward.  Over this is a good sign for him.  [name], MD [place] 6:10

## 2020-08-19 NOTE — Progress Notes (Addendum)
SLP Cancellation Note  Patient Details Name: Jeff Rodriguez MRN: [id number] DOB: 14-May-1945   Cancelled treatment:       Reason Eval/Treat Not Completed: Other (comment) (IV team or venipuncture in room with pt providing service, will continue efforts. Note plan for pt to transfer to [facility] for dialysis and concern for somnolence.)  Kathleen Lime, MS [facility] SLP Acute Rehab Services Office [phone] Pager [phone]  Macario Golds 08/19/2020, 5:09 PM

## 2020-08-19 NOTE — Progress Notes (Signed)
Took pt down to CT scan. Blood finished at 5:31pm in Ct scan, began flushing the line at 5:32 pm. Transported pt to ICU to 1239.  Jerene Pitch

## 2020-08-20 DIAGNOSIS — E119 Type 2 diabetes mellitus without complications: Secondary | ICD-10-CM

## 2020-08-20 DIAGNOSIS — N10 Acute tubulo-interstitial nephritis: Secondary | ICD-10-CM

## 2020-08-20 DIAGNOSIS — I10 Essential (primary) hypertension: Secondary | ICD-10-CM

## 2020-08-20 DIAGNOSIS — C9000 Multiple myeloma not having achieved remission: Secondary | ICD-10-CM | POA: Diagnosis not present

## 2020-08-20 DIAGNOSIS — E785 Hyperlipidemia, unspecified: Secondary | ICD-10-CM

## 2020-08-20 DIAGNOSIS — Z79899 Other long term (current) drug therapy: Secondary | ICD-10-CM

## 2020-08-20 DIAGNOSIS — D61818 Other pancytopenia: Secondary | ICD-10-CM | POA: Diagnosis not present

## 2020-08-20 DIAGNOSIS — Z7189 Other specified counseling: Secondary | ICD-10-CM | POA: Diagnosis not present

## 2020-08-20 DIAGNOSIS — D751 Secondary polycythemia: Secondary | ICD-10-CM

## 2020-08-20 DIAGNOSIS — U071 COVID-19: Secondary | ICD-10-CM

## 2020-08-20 LAB — GLUCOSE, CAPILLARY
Glucose-Capillary: 159 mg/dL — ABNORMAL HIGH (ref 70–99)
Glucose-Capillary: 149 mg/dL — ABNORMAL HIGH (ref 70–99)
Glucose-Capillary: 173 mg/dL — ABNORMAL HIGH (ref 70–99)
Glucose-Capillary: 133 mg/dL — ABNORMAL HIGH (ref 70–99)
Glucose-Capillary: 179 mg/dL — ABNORMAL HIGH (ref 70–99)
Glucose-Capillary: 177 mg/dL — ABNORMAL HIGH (ref 70–99)

## 2020-08-20 LAB — HEPATIC FUNCTION PANEL
ALT: 13 U/L (ref 0–44)
AST: 18 U/L (ref 15–41)
Albumin: 1.9 g/dL — ABNORMAL LOW (ref 3.5–5.0)
Alkaline Phosphatase: 35 U/L — ABNORMAL LOW (ref 38–126)
Bilirubin, Direct: 0.2 mg/dL (ref 0.0–0.2)
Indirect Bilirubin: 0.6 mg/dL (ref 0.3–0.9)
Total Bilirubin: 0.8 mg/dL (ref 0.3–1.2)
Total Protein: 5.3 g/dL — ABNORMAL LOW (ref 6.5–8.1)

## 2020-08-20 LAB — APTT: aPTT: 39 seconds — ABNORMAL HIGH (ref 24–36)

## 2020-08-20 LAB — BPAM PLATELET PHERESIS
Blood Product Expiration Date: 202207012359
ISSUE DATE / TIME: 202206291809
Unit Type and Rh: 7300

## 2020-08-20 LAB — CBC WITH DIFFERENTIAL/PLATELET
Abs Immature Granulocytes: 0.21 10*3/uL — ABNORMAL HIGH (ref 0.00–0.07)
Abs Immature Granulocytes: 0.09 10*3/uL — ABNORMAL HIGH (ref 0.00–0.07)
Basophils Absolute: 0 10*3/uL (ref 0.0–0.1)
Basophils Absolute: 0 10*3/uL (ref 0.0–0.1)
Basophils Relative: 0 %
Basophils Relative: 1 %
Eosinophils Absolute: 0.1 10*3/uL (ref 0.0–0.5)
Eosinophils Absolute: 0.1 10*3/uL (ref 0.0–0.5)
Eosinophils Relative: 3 %
Eosinophils Relative: 4 %
HCT: 23.2 % — ABNORMAL LOW (ref 39.0–52.0)
HCT: 23.5 % — ABNORMAL LOW (ref 39.0–52.0)
Hemoglobin: 7.9 g/dL — ABNORMAL LOW (ref 13.0–17.0)
Hemoglobin: 8.1 g/dL — ABNORMAL LOW (ref 13.0–17.0)
Immature Granulocytes: 9 %
Immature Granulocytes: 4 %
Lymphocytes Relative: 27 %
Lymphocytes Relative: 25 %
Lymphs Abs: 0.6 10*3/uL — ABNORMAL LOW (ref 0.7–4.0)
Lymphs Abs: 0.7 10*3/uL (ref 0.7–4.0)
MCH: 31 pg (ref 26.0–34.0)
MCH: 31.5 pg (ref 26.0–34.0)
MCHC: 34.1 g/dL (ref 30.0–36.0)
MCHC: 34.5 g/dL (ref 30.0–36.0)
MCV: 91 fL (ref 80.0–100.0)
MCV: 91.4 fL (ref 80.0–100.0)
Monocytes Absolute: 0.2 10*3/uL (ref 0.1–1.0)
Monocytes Absolute: 0.2 10*3/uL (ref 0.1–1.0)
Monocytes Relative: 8 %
Monocytes Relative: 9 %
Neutro Abs: 1.2 10*3/uL — ABNORMAL LOW (ref 1.7–7.7)
Neutro Abs: 1.5 10*3/uL — ABNORMAL LOW (ref 1.7–7.7)
Neutrophils Relative %: 53 %
Neutrophils Relative %: 57 %
Platelets: 35 10*3/uL — ABNORMAL LOW (ref 150–400)
Platelets: 33 10*3/uL — ABNORMAL LOW (ref 150–400)
RBC: 2.55 MIL/uL — ABNORMAL LOW (ref 4.22–5.81)
RBC: 2.57 MIL/uL — ABNORMAL LOW (ref 4.22–5.81)
RDW: 17.2 % — ABNORMAL HIGH (ref 11.5–15.5)
RDW: 17.2 % — ABNORMAL HIGH (ref 11.5–15.5)
WBC: 2.3 10*3/uL — ABNORMAL LOW (ref 4.0–10.5)
WBC: 2.6 10*3/uL — ABNORMAL LOW (ref 4.0–10.5)
nRBC: 0 % (ref 0.0–0.2)
nRBC: 0 % (ref 0.0–0.2)

## 2020-08-20 LAB — RENAL FUNCTION PANEL
Albumin: 1.9 g/dL — ABNORMAL LOW (ref 3.5–5.0)
Albumin: 1.9 g/dL — ABNORMAL LOW (ref 3.5–5.0)
Anion gap: 7 (ref 5–15)
Anion gap: 7 (ref 5–15)
BUN: 69 mg/dL — ABNORMAL HIGH (ref 8–23)
BUN: 45 mg/dL — ABNORMAL HIGH (ref 8–23)
CO2: 25 mmol/L (ref 22–32)
CO2: 27 mmol/L (ref 22–32)
Calcium: 6.8 mg/dL — ABNORMAL LOW (ref 8.9–10.3)
Calcium: 6.8 mg/dL — ABNORMAL LOW (ref 8.9–10.3)
Chloride: 101 mmol/L (ref 98–111)
Chloride: 100 mmol/L (ref 98–111)
Creatinine, Ser: 4.74 mg/dL — ABNORMAL HIGH (ref 0.61–1.24)
Creatinine, Ser: 2.99 mg/dL — ABNORMAL HIGH (ref 0.61–1.24)
GFR, Estimated: 12 mL/min — ABNORMAL LOW
GFR, Estimated: 21 mL/min — ABNORMAL LOW (ref >60)
Glucose, Bld: 167 mg/dL — ABNORMAL HIGH (ref 70–99)
Glucose, Bld: 184 mg/dL — ABNORMAL HIGH (ref 70–99)
Phosphorus: 3.7 mg/dL (ref 2.5–4.6)
Phosphorus: 3 mg/dL (ref 2.5–4.6)
Potassium: 3.1 mmol/L — ABNORMAL LOW (ref 3.5–5.1)
Potassium: 3.3 mmol/L — ABNORMAL LOW (ref 3.5–5.1)
Sodium: 133 mmol/L — ABNORMAL LOW (ref 135–145)
Sodium: 134 mmol/L — ABNORMAL LOW (ref 135–145)

## 2020-08-20 LAB — LACTATE DEHYDROGENASE: LDH: 174 U/L (ref 98–192)

## 2020-08-20 LAB — PREPARE PLATELET PHERESIS: Unit division: 0

## 2020-08-20 LAB — MAGNESIUM: Magnesium: 2.1 mg/dL (ref 1.7–2.4)

## 2020-08-20 LAB — C-REACTIVE PROTEIN: CRP: 9.7 mg/dL — ABNORMAL HIGH (ref <1.0)

## 2020-08-20 MED ORDER — ALTEPLASE 2 MG IJ SOLR
2.0000 mg | Freq: Once | INTRAMUSCULAR | Status: AC
Start: 2020-08-20 — End: 2020-08-20
  Administered 2020-08-20: 1.2 mg

## 2020-08-20 MED ORDER — SODIUM CHLORIDE 0.9 % IV SOLN
300.0000 [IU]/h | INTRAVENOUS | Status: DC
  Administered 2020-08-20: 300 [IU]/h via INTRAVENOUS_CENTRAL
  Filled 2020-08-20: qty 10000

## 2020-08-20 MED ORDER — POTASSIUM CHLORIDE CRYS ER 20 MEQ PO TBCR
20.0000 meq | EXTENDED_RELEASE_TABLET | Freq: Once | ORAL | Status: AC
  Administered 2020-08-20: 20 meq via ORAL
  Filled 2020-08-20: qty 1

## 2020-08-20 MED ORDER — DARBEPOETIN ALFA 60 MCG/0.3ML IJ SOSY: 60.0000 ug | PREFILLED_SYRINGE | INTRAMUSCULAR | Status: DC

## 2020-08-20 MED ORDER — HEPARIN BOLUS VIA INFUSION (CRRT): 1000.0000 [IU] | INTRAVENOUS | Status: DC | PRN

## 2020-08-20 MED ORDER — HEPARIN SODIUM (PORCINE) 5000 UNIT/ML IJ SOLN: 300.0000 [IU]/h | INTRAMUSCULAR | Status: DC

## 2020-08-20 MED ORDER — EPOETIN ALFA 20000 UNIT/ML IJ SOLN
40000.0000 [IU] | INTRAMUSCULAR | Status: DC
  Administered 2020-08-20: 40000 [IU] via SUBCUTANEOUS
  Filled 2020-08-20: qty 2

## 2020-08-20 MED ORDER — CALCIUM CARBONATE 1250 (500 CA) MG PO TABS
1000.0000 mg | ORAL_TABLET | Freq: Two times a day (BID) | ORAL | Status: DC
  Administered 2020-08-20 – 2020-09-09 (×31): 1000 mg
  Filled 2020-08-20 (×31): qty 1

## 2020-08-20 MED ORDER — NEPRO/CARBSTEADY PO LIQD
237.0000 mL | ORAL | Status: DC
  Administered 2020-08-20: 237 mL
  Filled 2020-08-20 (×2): qty 237

## 2020-08-20 MED ORDER — STERILE WATER FOR INJECTION IJ SOLN
INTRAMUSCULAR | Status: AC
  Filled 2020-08-20: qty 10

## 2020-08-20 NOTE — Progress Notes (Addendum)
HEMATOLOGY-ONCOLOGY PROGRESS NOTE  SUBJECTIVE: Jeff Rodriguez is now on CRRT.  He is laying in bed and is minimally responsive.  He does not open his eyes but will answer some very brief yes/no questions.  Nursing reports that he is more alert than he was yesterday.  REVIEW OF SYSTEMS:   Unable to obtain  I have reviewed the past medical history, past surgical history, social history and family history with the patient and they are unchanged from previous note.   PHYSICAL EXAMINATION: ECOG PERFORMANCE STATUS: 4 - Bedbound  Vitals:   08/20/20 0700 08/20/20 0800  BP: (!) 145/87 (!) 121/57  Pulse: 90 80  Resp: 19 19  Temp:  97.8 F (36.6 C)  SpO2: 99% 97%   Filed Weights   08/18/20 0411 08/19/20 0451 08/20/20 0423  Weight: 75.5 kg 75.9 kg 71.6 kg    Intake/Output from previous day: 06/29 0701 - 06/30 0700 In: 1552.1 [I.V.:295.1; Blood:742; NG/GT:465; IV Piggyback:50] Out: 2011 [Urine:1290]  General: Chronically ill-appearing male, laying in bed, no distress.   Eyes:  no scleral icterus.     Respiratory: lungs were clear bilaterally without wheezing or crackles.   Cardiovascular:  Regular rate and rhythm, S1/S2, without murmur, rub or gallop.  There was no pedal edema.   GI:  abdomen was soft, flat, nontender, nondistended, without organomegaly.   Neuro: Does not open eyes but can answer yes/no questions, minimally responsive  LABORATORY DATA:  I have reviewed the data as listed CMP Latest Ref Rng & Units 08/20/2020 08/19/2020 08/19/2020  Glucose 70 - 99 mg/dL 167(H) 101(H) 146(H)  BUN 8 - 23 mg/dL 69(H) 95(H) 95(H)  Creatinine 0.61 - 1.24 mg/dL 4.74(H) 7.08(H) 7.13(H)  Sodium 135 - 145 mmol/L 133(L) 132(L) 131(L)  Potassium 3.5 - 5.1 mmol/L 3.1(L) 3.3(L) 3.2(L)  Chloride 98 - 111 mmol/L 101 98 97(L)  CO2 22 - 32 mmol/L 25 21(L) 23  Calcium 8.9 - 10.3 mg/dL 6.8(L) 6.8(L) 6.6(L)  Total Protein 6.5 - 8.1 g/dL 5.3(L) - 5.1(L)  Total Bilirubin 0.3 - 1.2 mg/dL 0.8 - 0.6  Alkaline  Phos 38 - 126 U/L 35(L) - 38  AST 15 - 41 U/L 18 - 16  ALT 0 - 44 U/L 13 - 14    Lab Results  Component Value Date   WBC 2.6 (L) 08/20/2020   HGB 8.1 (L) 08/20/2020   HCT 23.5 (L) 08/20/2020   MCV 91.4 08/20/2020   PLT 33 (L) 08/20/2020   NEUTROABS 1.5 (L) 08/20/2020    DG Abd 1 View  Result Date: 08/19/2020 CLINICAL DATA:  NG tube placement EXAM: ABDOMEN - 1 VIEW COMPARISON:  None. FINDINGS: Feeding tube is in place with the tip in the right upper abdomen. This could be in the distal stomach or proximal duodenum. IMPRESSION: Feeding tube tip in the distal stomach or proximal duodenum. Electronically Signed   By: Rolm Baptise M.D.   On: 08/19/2020 20:15   CT HEAD WO CONTRAST  Result Date: 08/19/2020 CLINICAL DATA:  Mental status change.  COVID-19 positive. EXAM: CT HEAD WITHOUT CONTRAST TECHNIQUE: Contiguous axial images were obtained from the base of the skull through the vertex without intravenous contrast. COMPARISON:  None. FINDINGS: Brain: There is extensive encephalomalacia in the right occipital and posterior right temporal lobes consistent with a chronic PCA infarct with associated dystrophic calcification and ex vacuo dilatation of the right lateral ventricle. There is also encephalomalacia at the right temporoparietal junction (posterior MCA territory/border zone). A small cortical infarct in  the left occipital lobe is also felt to be chronic. No acute large territory infarct, intracranial hemorrhage, mass, midline shift, or extra-axial fluid collection is identified. Vascular: Calcified atherosclerosis at the skull base. No hyperdense vessel. Skull: No fracture or suspicious osseous lesion. Sinuses/Orbits: Extensive opacification of the paranasal sinuses bilaterally by combination of mucosal thickening and fluid. Small bilateral mastoid effusions. Bilateral cataract extraction. Other: Partially visualized nasoenteric tube. IMPRESSION: 1. No evidence of acute intracranial abnormality.  2. 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 Electronically Signed   By: Logan Bores M.D.   On: 08/19/2020 18:24   US RENAL  Result Date: 08/12/2020 CLINICAL DATA:  Acute renal failure. EXAM: RENAL / URINARY TRACT ULTRASOUND COMPLETE COMPARISON:  CT 06/04/2010. FINDINGS: Right Kidney: Renal measurements: 11.6 x 5.5 x 5.9 cm = volume: 197.5 mL. Increased echogenicity. No mass or hydronephrosis visualized. Left Kidney: Renal measurements: 11.5 x 6.4 x 5.3 cm = volume: 204.9 mL. Increased echogenicity. No mass or hydronephrosis visualized. Bladder: Appears normal for degree of bladder distention. Other: The prostate is enlarged at 6.1 x 3.9 x 5.3 cm. The prostate is irregular in contour. IMPRESSION: 1. Increased echogenicity both kidneys consistent chronic medical renal disease. No acute abnormality identified. No hydronephrosis or bladder distention. 2.  The prostate is enlarged and irregular in contour. Electronically Signed   By: Marcello Moores  Register   On: 08/12/2020 07:15   DG CHEST PORT 1 VIEW  Result Date: 08/19/2020 CLINICAL DATA:  Central line placement EXAM: PORTABLE CHEST 1 VIEW COMPARISON:  08/11/2020 FINDINGS: Right central line is been placed with the tip in the SVC. No pneumothorax. Heart is normal size. Left basilar atelectasis or infiltrate, new since prior study. No visible effusions. OG tube is seen entering the stomach. The distal aspect of the tube is not visualized. IMPRESSION: Right central line tip in the SVC.  No pneumothorax. New left lower lobe atelectasis or infiltrate. Electronically Signed   By: Rolm Baptise M.D.   On: 08/19/2020 20:14   DG Chest Port 1 View  Result Date: 08/11/2020 CLINICAL DATA:  Cough. EXAM: PORTABLE CHEST 1 VIEW COMPARISON:  04/20/2015 FINDINGS: The lungs are clear without focal pneumonia, edema, pneumothorax or pleural effusion. The cardiopericardial silhouette is within normal limits for size. Degenerative changes noted in the right  shoulder. Telemetry leads overlie the chest. IMPRESSION: No active disease. Electronically Signed   By: Misty Stanley M.D.   On: 08/11/2020 17:08   DG Abd Portable 1V  Result Date: 08/17/2020 CLINICAL DATA:  Feeding tube EXAM: PORTABLE ABDOMEN - 1 VIEW COMPARISON:  04/24/2019 FINDINGS: Esophageal tube tip overlies distal stomach. Surgical clips in the right upper quadrant. Multiple treated compression deformities of the thoracolumbar spine. Mild air distended small bowel in the right abdomen up to 3.5 cm with scattered colon gas and more focally dilated probable colon in the pelvis. IMPRESSION: 1. Esophageal tube tip overlies distal stomach. 2. Mild air distension of small bowel in the right mid abdomen with scattered colon gas, question mild ileus Electronically Signed   By: Donavan Foil M.D.   On: 08/17/2020 14:12   DG Bone Survey Met  Result Date: 08/18/2020 CLINICAL DATA:  History of myeloma. EXAM: METASTATIC BONE SURVEY COMPARISON:  Various previous imaging studies. FINDINGS: Lateral skull: No lytic lesion. Right shoulder: No lytic lesion.  Chronic degenerative changes. Left shoulder: No lytic lesion.  Chronic degenerative changes. Left humerus: No lytic lesion. Right humerus: No lytic lesion. Right forearm: No lytic lesion. Left forearm:  No lytic lesion. Cervical spine: Previous anterior and posterior fusion. No evidence of active lytic lesion. Thoracic spine two views: Distant augmentation at T10 and T12. No lytic lesion or fracture seen otherwise. Lumbar spine two views: Previous augmentation at L1, L2, L3 and L5. No sign of lytic lesion. No progressive collapse. AP pelvis one view: No lytic lesion. Previous hip replacement on the right. Left hip: No lytic lesion Left femur: No lytic lesion.  Previous knee replacement. Right femur: No lytic lesion. Previous hip replacement and knee replacement. Right hip: No lytic lesion.  Previous hip replacement. Right tib fib: No lytic lesion. Left hip fib: No  lytic lesion. AP chest one view: Soft feeding tube enters the abdomen. Atelectasis or infiltrate in both lung bases, new since the study 1 week ago. No lytic lesions seen in the ribcage. IMPRESSION: No skeletal lytic lesions. Multiple augmented thoracic and lumbar fractures. No new or progressive fractures. Newly seen bilateral lower lobe atelectasis and or pneumonia. Electronically Signed   By: Nelson Chimes M.D.   On: 08/18/2020 15:14    ASSESSMENT AND PLAN: 1.  Multiple myeloma currently followed at the [facility] 2.  AKI on CKD-?  Secondary to ACE versus myeloma versus COVID related 3.  COVID-19 infection 4.  Pancytopenia due to recent chemotherapy 5.  Hypocalcemia-received recent bisphosphonate therapy 6.  Hypertension 7.  Diabetes mellitus 8.  Hyperlipidemia  -Creatinine somewhat improved today.  Receiving CRRT.  Nephrology following.  Unclear if AKI is due to to ATN, prerenal from poor p.o. intake, myeloma kidney, or long-term use of [name].  Continue ongoing CRRT/management of AKI per nephrology. -CBC from this morning has been reviewed.  WBC slowly trending upward.  Hemoglobin stable.  Platelets are up today but he received a unit of platelets on 6/29.  Recommend close monitoring.  Recommend PRBC transfusion for hemoglobin less than 7 or transfuse platelets for platelet count less than 10,000 or active bleeding.   LOS: 9 days   Mikey Bussing, DNP, AGPCNP-BC, AOCNP 08/20/20  ADDENDUM: Mr. Hedding is now on CRRT.  His BUN and creatinine are a little better.  His CBC shows small but steady improvement in his white blood cells.  His hemoglobin is 8.1.  His platelet count is 33,000.  His light chains that we redid are both elevated.  Again I suspect this is all from his renal insufficiency.  He does not have an elevated light chain and the other 1 decreased.  Again I still do not believe that myeloma is the issue with his renal failure.  I would think that we would be seeing something with  his myeloma studies that would suggest this.  I do think that he would benefit from Procrit.  Again he has a low erythropoietin level for his degree of anemia.  I think Procrit certainly help given that he has renal failure.  Overall, his performance status is just is not that great.  I know that he will get outstanding care down in the ICU.  I know all the staff down there will work very hard to try to improve his status.  [name], MD  Acts 20:35

## 2020-08-20 NOTE — Progress Notes (Signed)
NUTRITION NOTE  Patient discussed in rounds this AM and one-on-one with RN. Panda tube became dislodged yesterday around 1800 and TF stopped at that time. TF had been advanced to goal rate of 50 ml/hr shortly before dislodgement.   RN reports that panda replaced and TF resumed at 30 ml/hr around 0500 today. Patient to receive TF x18 hours/day (1700-1100). Decision made to advance TF to 40 ml/hr at this time, advance to goal rate of 50 ml/hr at end of day shift, and then stop TF as planned 7/1 at 1100.   RD will continue to follow per protocol.      Jarome Matin, MS, RD, LDN, CNSC Inpatient Clinical Dietitian RD pager # available in [place]  After hours/weekend pager # available in [facility]

## 2020-08-20 NOTE — Progress Notes (Signed)
eLink Physician-Brief Progress Note Patient Name: Jeff Rodriguez DOB: Jul 14, 1945 MRN: [id number]   Date of Service  08/20/2020  HPI/Events of Note  Hemoglobin 7.9 gm / dl, no evidence of active hemorrhage.  eICU Interventions  Second unit of PRBC ordered discontinued.        Jeff Rodriguez 08/20/2020, 2:12 AM

## 2020-08-20 NOTE — TOC Progression Note (Addendum)
Transition of Care [facility]) - Progression Note    Patient Details  Name: Jeff Rodriguez MRN: [id number] Date of Birth: 1945/04/25  Transition of Care [facility]) CM/SW Contact  Leeroy Cha, RN Phone Number: 08/20/2020, 8:25 AM  Clinical Narrative:    [phone] no bed offers search for bed extended to [place] and [place]/fl2 faxed out [id number]/t[facility] [place] [facility] and provider is Dr. Cher Nakai, csw is [place] ([id number]-lee)[place] phone=[phone]  Expected Discharge Plan: [place] Barriers to Discharge: SNF Pending bed offer  Expected Discharge Plan and Services Expected Discharge Plan: [place]   Discharge Planning Services: CM Consult Post Acute Care Choice: [place] Living arrangements for the past 2 months: Single Family Home                                       Social Determinants of Health (SDOH) Interventions    Readmission Risk Interventions No flowsheet data found.

## 2020-08-20 NOTE — Progress Notes (Signed)
Filter clotted and large clots pulled from the HD catheter. Plts are 33K. Will try low dose heparin into the CRRT circuit, and watch PTT closely for any elevation. Pt is not looking much better today, was hoping that w/ dialysis he would improve. Would suggest palliative care involvement.   Kelly Splinter, MD 08/20/2020, 3:33 PM

## 2020-08-20 NOTE — Progress Notes (Signed)
PROGRESS NOTE    Jeff Rodriguez  NOB:[id number] DOB: 14-Sep-1945 DOA: 08/11/2020 PCP: [facility], [facility]   Brief Narrative: Taken from prior notes. Jeff Rodriguez is a 75 y.o. male with medical history significant of multiple myeloma (on [name]), diabetes, hypertension, asthma, hyperlipidemia and osteoarthritis who was brought in by EMS secondary to productive cough, poor oral intake and not eating much.  Patient is currently undergoing chemo for his multiple myeloma. Patient was a poor historian on admission.  He lives alone with 1 son living locally and another son that calls to check on him consistently.  He is a patient of the [facility] and managed there in regards to his multiple myeloma.   He underwent work-up in the ER and tested positive for COVID-19.  He was also found to be hyponatremic, hypokalemic, and significant renal failure.  He was started on fluids and admitted for further work-up.  Creatinine remained elevated, some improvement in urinary output.  Remains very weak and lethargic.  NG tube was placed and he was started on tube feed as appetite remains poor.  Worsening cell counts, neutropenia and thrombocytopenia, oncology ordered Neupogen. Bone scan done today, 08/18/2020  was negative for any new lytic bone lesions.  Patient is currently full code, very high risk for deterioration and death.  Palliative care was consulted, patient decided to continue aggressive management including dialysis at this time.  6/29.Patient need to be transferred to [facility] to start dialysis-bed request was done, still pending as there is no availability due to staff shortage. Later patient was transferred to ICU and started on CRRT as there was still no bed at [facility]  6/30: Patient received 1 unit of PRBC and 1 unit of platelets yesterday.  Subjective: Patient remained quite lethargic, on CRRT when seen today.  He did not open his eyes but able to answer questions  appropriately.  Son at bedside, according to son he tried couple of bites of pancake this morning.  He would like to continue with tube feed.  Denies any pain.  Assessment & Plan:   Principal Problem:   Acute renal failure superimposed on stage 3b chronic kidney disease ([place]) Active Problems:   Hyperlipidemia   Diabetes mellitus with complication ([place])   Essential hypertension   COVID-19 virus infection   Hypokalemia   Pancytopenia (HCC)   Multiple myeloma (HCC)   Failure to thrive in adult  AKI with CKD stage IIIb.  Baseline creatinine appears to be around 1.7-2.  Creatinine remained above 7.  Nephrology is on board-appreciate their help.  Some improvement in urinary output. Differential includes ATN, prerenal from poor p.o. intake, myeloma kidney and he was also on [name] for a long time. Remained very lethargic, answering questions appropriately. CT head was done yesterday due to altered mental status which was negative for any acute abnormalities.  ABG done yesterday also showed normal CO2. He was started on CRRT with improvement in BUN today. -Nephrology is planning to continue CRRT for another day before transferring him to [facility] for regular dialysis. -Monitor renal function -Avoid nephrotoxins  Pancytopenia.  S/p 3 units of PRBC and 1 unit of platelets.  Hemoglobin at 8.1 today, remained afebrile. He was started on broad-spectrum antibiotics with meropenem which he received for 5 days with some concern of UTI, urine cultures with multiple flora.  Meropenem was discontinued on 6/28.. WBC at 2.6 and platelets of 33 today. -2 unit of PRBC ordered-received 1 unit along with 1 unit  of platelet, second transfusion was discontinued by PCCM. -Hematology is on board and they started him on Neupogen-day 3 today patient will need -Monitor cell count.  Failure to thrive.  Most likely multiple factorial with underlying malignancy, recent COVID infection and renal failure. He was  started on tube feed-started on 08/17/2020 Prognosis very guarded-patient wishes to stay full code. High risk for refeeding syndrome. -Continue with tube feed -Monitor electrolytes  COVID-19 infection.  On room air. Completed the course of remdesivir. -Continue with supportive care as needed.  Multiple myeloma. -[name] is on hold. -Bone scan without any new lytic lesions today. -Oncology is on board -Repeat flow cytometry was ordered--pending results.  Electrolyte abnormalities.  Some improvement in sodium, and calcium today.  Received 1 dose of desmopressin yesterday. Potassium at 3.1. -Replete electrolyte as needed -Continue calcium supplement -Continue to monitor.  Essential hypertension.  Blood pressure within goal. -Continue current management.  Diabetes mellitus. -Continue with SSI  Objective: Vitals:   08/20/20 0423 08/20/20 0500 08/20/20 0600 08/20/20 0700  BP:  127/65 (!) 129/49 (!) 145/87  Pulse:  77 72 90  Resp:  $Remo'17 18 19  'iIKbm$ Temp:      TempSrc:      SpO2:  98% 98% 99%  Weight: 71.6 kg     Height:        Intake/Output Summary (Last 24 hours) at 08/20/2020 0732 Last data filed at 08/20/2020 0700 Gross per 24 hour  Intake 1552.14 ml  Output 2011 ml  Net -458.86 ml    Filed Weights   08/18/20 0411 08/19/20 0451 08/20/20 0423  Weight: 75.5 kg 75.9 kg 71.6 kg    Examination:  [place] elderly man, in no acute distress.  Keeping eyes closed but answering appropriately and following commands. Pulmonary.  Lungs clear bilaterally, normal respiratory effort. CV.  Regular rate and rhythm, no JVD, rub or murmur. Abdomen.  Soft, nontender, nondistended, BS positive. CNS.  Lethargic but oriented, no apparent focal deficit. Extremities.  No edema, no cyanosis, pulses intact and symmetrical. Psychiatry.  Judgment and insight appears normal.   DVT prophylaxis: SCDs, patient has thrombocytopenia Code Status: Full Family Communication: Son was updated  at bedside. Disposition Plan:  Status is: Inpatient  Remains inpatient appropriate because:Inpatient level of care appropriate due to severity of illness  Dispo: The patient is from: Home              Anticipated d/c is to: Home              Patient currently is not medically stable to d/c.   Difficult to place patient No              Level of care: ICU  All the records are reviewed and case discussed with Care Management/Social Worker. Management plans discussed with the patient, nursing and they are in agreement.  Consultants:  Nephrology Oncology Palliative care medicine  Procedures:  Antimicrobials:   Data Reviewed: I have personally reviewed following labs and imaging studies  CBC: Recent Labs  Lab 08/16/20 0326 08/17/20 0312 08/18/20 0316 08/19/20 0346 08/20/20 0000 08/20/20 0500  WBC 1.1* 1.2* 1.3* 1.7* 2.3* 2.6*  NEUTROABS 0.4*  --  0.4* 0.8* 1.2* 1.5*  HGB 7.9* 8.9* 8.4* 7.5* 7.9* 8.1*  HCT 22.8* 25.8* 25.3* 22.4* 23.2* 23.5*  MCV 91.9 91.5 94.4 93.3 91.0 91.4  PLT 28* 27* 23* 19* 35* 33*    Basic Metabolic Panel: Recent Labs  Lab 08/16/20 0326 08/17/20 0312 08/18/20 0316  08/19/20 0346 08/19/20 1629 08/20/20 0500  NA 129* 130* 129* 131* 132* 133*  K 3.0* 3.7 3.4* 3.2* 3.3* 3.1*  CL 89* 94* 95* 97* 98 101  CO2 $Re'25 24 23 23 'bIe$ 21* 25  GLUCOSE 127* 125* 140* 146* 101* 167*  BUN 95* 97* 96* 95* 95* 69*  CREATININE 7.21* 7.07* 7.16* 7.13* 7.08* 4.74*  CALCIUM 6.1* 6.2* 6.0* 6.6* 6.8* 6.8*  MG 1.9 2.4 2.3 2.1  --  2.1  PHOS 4.0 2.9 3.3 3.9 4.6 3.7    GFR: Estimated Creatinine Clearance: 13.2 mL/min (A) (by C-G formula based on SCr of 4.74 mg/dL (H)). Liver Function Tests: Recent Labs  Lab 08/16/20 0326 08/17/20 0312 08/18/20 0316 08/19/20 0346 08/19/20 1629 08/20/20 0500  AST $Re'20 18 15 16  'VKt$ --  18  ALT $Re'19 16 14 14  '[place]$ --  13  ALKPHOS 38 38 37* 38  --  35*  BILITOT 0.5 0.6 0.5 0.6  --  0.8  PROT 4.9* 5.1* 4.9* 5.1*  --  5.3*  ALBUMIN 1.9* 1.8*  1.9* 1.8* 1.8* 1.9*  1.9*    No results for input(s): LIPASE, AMYLASE in the last 168 hours. No results for input(s): AMMONIA in the last 168 hours. Coagulation Profile: Recent Labs  Lab 08/19/20 1629  INR 1.2   Cardiac Enzymes: No results for input(s): CKTOTAL, CKMB, CKMBINDEX, TROPONINI in the last 168 hours. BNP (last 3 results) No results for input(s): PROBNP in the last 8760 hours. HbA1C: No results for input(s): HGBA1C in the last 72 hours. CBG: Recent Labs  Lab 08/19/20 1228 08/19/20 1555 08/19/20 2007 08/19/20 2309 08/20/20 0353  GLUCAP 135* 89 101* 121* 159*    Lipid Profile: No results for input(s): CHOL, HDL, LDLCALC, TRIG, CHOLHDL, LDLDIRECT in the last 72 hours. Thyroid Function Tests: No results for input(s): TSH, T4TOTAL, FREET4, T3FREE, THYROIDAB in the last 72 hours. Anemia Panel: No results for input(s): VITAMINB12, FOLATE, FERRITIN, TIBC, IRON, RETICCTPCT in the last 72 hours.  Sepsis Labs: Recent Labs  Lab 08/15/20 0417 08/16/20 0326 08/17/20 0312  PROCALCITON 1.12 1.03 0.71     Recent Results (from the past 240 hour(s))  Resp Panel by RT-PCR (Flu A&B, Covid) Nasopharyngeal Swab     Status: Abnormal   Collection Time: 08/11/20  3:50 PM   Specimen: Nasopharyngeal Swab; Nasopharyngeal(NP) swabs in vial transport medium  Result Value Ref Range Status   SARS Coronavirus 2 by RT PCR POSITIVE (A) NEGATIVE Final    Comment: RESULT CALLED TO, READ BACK BY AND VERIFIED WITH: M.BELFI AT 1949 ON 06.21.22 BY N.THOMPSON (NOTE) SARS-CoV-2 target nucleic acids are DETECTED.  The SARS-CoV-2 RNA is generally detectable in upper respiratory specimens during the acute phase of infection. Positive results are indicative of the presence of the identified virus, but do not rule out bacterial infection or co-infection with other pathogens not detected by the test. Clinical correlation with patient history and other diagnostic information is necessary to  determine patient infection status. The expected result is Negative.  Fact Sheet for Patients: EntrepreneurPulse.com.au  Fact Sheet for Healthcare Providers: IncredibleEmployment.be  This test is not yet approved or cleared by the [place] FDA and  has been authorized for detection and/or diagnosis of SARS-CoV-2 by FDA under an Emergency Use Authorization (EUA).  This EUA will remain in effect (meaning this tes t can be used) for the duration of  the COVID-19 declaration under Section 564(b)(1) of the Act, 21 U.S.C. section 360bbb-3(b)(1), unless the authorization is terminated  or revoked sooner.     Influenza A by PCR NEGATIVE NEGATIVE Final   Influenza B by PCR NEGATIVE NEGATIVE Final    Comment: (NOTE) The Xpert Xpress SARS-CoV-2/FLU/RSV plus assay is intended as an aid in the diagnosis of influenza from Nasopharyngeal swab specimens and should not be used as a sole basis for treatment. Nasal washings and aspirates are unacceptable for Xpert Xpress SARS-CoV-2/FLU/RSV testing.  Fact Sheet for Patients: EntrepreneurPulse.com.au  Fact Sheet for Healthcare Providers: IncredibleEmployment.be  This test is not yet approved or cleared by the [place] FDA and has been authorized for detection and/or diagnosis of SARS-CoV-2 by FDA under an Emergency Use Authorization (EUA). This EUA will remain in effect (meaning this test can be used) for the duration of the COVID-19 declaration under Section 564(b)(1) of the Act, 21 U.S.C. section 360bbb-3(b)(1), unless the authorization is terminated or revoked.  Performed at [facility], [place] [address] [place], [place] [address]   Blood Culture (routine x 2)     Status: None   Collection Time: 08/11/20  3:50 PM   Specimen: BLOOD  Result Value Ref Range Status   Specimen Description   Final    BLOOD BLOOD RIGHT FOREARM Performed at  [place] [place] [address] [place], [place] [address]    Special Requests   Final    BOTTLES DRAWN AEROBIC AND ANAEROBIC Blood Culture adequate volume Performed at [facility] [address] [place], [place] [address]    Culture   Final    NO GROWTH 5 DAYS Performed at [facility], [place] [address] [place], [place] [address]    Report Status 08/16/2020 FINAL  Final  Blood Culture (routine x 2)     Status: None   Collection Time: 08/11/20  3:55 PM   Specimen: BLOOD  Result Value Ref Range Status   Specimen Description   Final    BLOOD BLOOD LEFT FOREARM Performed at [facility] [address] [place], [place] [address]    Special Requests   Final    BOTTLES DRAWN AEROBIC AND ANAEROBIC Blood Culture adequate volume Performed at [facility] [address], [place], [place] [address]    Culture   Final    NO GROWTH 5 DAYS Performed at [facility], [place] [address], [place], [place] [address]    Report Status 08/16/2020 FINAL  Final  Culture, Urine     Status: Abnormal   Collection Time: 08/14/20  3:11 PM   Specimen: Urine, Random  Result Value Ref Range Status   Specimen Description   Final    URINE, RANDOM Performed at [facility] [address] [place], [place] [address]    Special Requests   Final    NONE Performed at [facility], [place] [address] [place], [place] [address]    Culture MULTIPLE SPECIES PRESENT, SUGGEST RECOLLECTION (A)  Final   Report Status 08/15/2020 FINAL  Final  MRSA PCR Screening     Status: None   Collection Time: 08/19/20  5:53 PM  Result Value Ref Range Status   MRSA by PCR NEGATIVE NEGATIVE Final    Comment:        The GeneXpert MRSA Assay (FDA approved for NASAL specimens only), is one component of a comprehensive MRSA colonization surveillance program. It is not intended to diagnose MRSA infection nor  to guide or monitor treatment for MRSA infections. Performed at  [facility], [place] [address] [place], [place] [address]       Radiology Studies: DG Abd 1 View  Result Date: 08/19/2020 CLINICAL DATA:  NG tube placement EXAM: ABDOMEN - 1 VIEW COMPARISON:  None. FINDINGS: Feeding tube is in place with the tip in the right upper abdomen. This could be in the distal stomach or proximal duodenum. IMPRESSION: Feeding tube tip in the distal stomach or proximal duodenum. Electronically Signed   By: Rolm Baptise M.D.   On: 08/19/2020 20:15   CT HEAD WO CONTRAST  Result Date: 08/19/2020 CLINICAL DATA:  Mental status change.  COVID-19 positive. EXAM: CT HEAD WITHOUT CONTRAST TECHNIQUE: Contiguous axial images were obtained from the base of the skull through the vertex without intravenous contrast. COMPARISON:  None. FINDINGS: Brain: There is extensive encephalomalacia in the right occipital and posterior right temporal lobes consistent with a chronic PCA infarct with associated dystrophic calcification and ex vacuo dilatation of the right lateral ventricle. There is also encephalomalacia at the right temporoparietal junction (posterior MCA territory/border zone). A small cortical infarct in the left occipital lobe is also felt to be chronic. No acute large territory infarct, intracranial hemorrhage, mass, midline shift, or extra-axial fluid collection is identified. Vascular: Calcified atherosclerosis at the skull base. No hyperdense vessel. Skull: No fracture or suspicious osseous lesion. Sinuses/Orbits: Extensive opacification of the paranasal sinuses bilaterally by combination of mucosal thickening and fluid. Small bilateral mastoid effusions. Bilateral cataract extraction. Other: Partially visualized nasoenteric tube. IMPRESSION: 1. No evidence of acute intracranial abnormality. 2. 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 Electronically Signed   By: Logan Bores M.D.    On: 08/19/2020 18:24   DG CHEST PORT 1 VIEW  Result Date: 08/19/2020 CLINICAL DATA:  Central line placement EXAM: PORTABLE CHEST 1 VIEW COMPARISON:  08/11/2020 FINDINGS: Right central line is been placed with the tip in the SVC. No pneumothorax. Heart is normal size. Left basilar atelectasis or infiltrate, new since prior study. No visible effusions. OG tube is seen entering the stomach. The distal aspect of the tube is not visualized. IMPRESSION: Right central line tip in the SVC.  No pneumothorax. New left lower lobe atelectasis or infiltrate. Electronically Signed   By: Rolm Baptise M.D.   On: 08/19/2020 20:14   DG Bone Survey Met  Result Date: 08/18/2020 CLINICAL DATA:  History of myeloma. EXAM: METASTATIC BONE SURVEY COMPARISON:  Various previous imaging studies. FINDINGS: Lateral skull: No lytic lesion. Right shoulder: No lytic lesion.  Chronic degenerative changes. Left shoulder: No lytic lesion.  Chronic degenerative changes. Left humerus: No lytic lesion. Right humerus: No lytic lesion. Right forearm: No lytic lesion. Left forearm: No lytic lesion. Cervical spine: Previous anterior and posterior fusion. No evidence of active lytic lesion. Thoracic spine two views: Distant augmentation at T10 and T12. No lytic lesion or fracture seen otherwise. Lumbar spine two views: Previous augmentation at L1, L2, L3 and L5. No sign of lytic lesion. No progressive collapse. AP pelvis one view: No lytic lesion. Previous hip replacement on the right. Left hip: No lytic lesion Left femur: No lytic lesion.  Previous knee replacement. Right femur: No lytic lesion. Previous hip replacement and knee replacement. Right hip: No lytic lesion.  Previous hip replacement. Right tib fib: No lytic lesion. Left hip fib: No lytic lesion. AP chest one view: Soft feeding tube enters the abdomen. Atelectasis or infiltrate in both lung bases, new since the study 1 week ago. No lytic lesions  seen in the ribcage. IMPRESSION: No  skeletal lytic lesions. Multiple augmented thoracic and lumbar fractures. No new or progressive fractures. Newly seen bilateral lower lobe atelectasis and or pneumonia. Electronically Signed   By: Nelson Chimes M.D.   On: 08/18/2020 15:14    Scheduled Meds:  (feeding supplement) PROSource Plus  30 mL Oral Daily   sodium chloride   Intravenous Once   calcium carbonate  1,000 mg of elemental calcium Oral BID WC   chlorhexidine  15 mL Mouth Rinse BID   Chlorhexidine Gluconate Cloth  6 each Topical Q0600   feeding supplement (NEPRO CARB STEADY)  1,000 mL Per Tube Q24H   feeding supplement (NEPRO CARB STEADY)  237 mL Oral Q24H   feeding supplement (PROSource TF)  45 mL Per Tube Daily   finasteride  5 mg Oral Daily   free water  50 mL Per Tube 6 X Daily   insulin aspart  0-20 Units Subcutaneous TID WC   insulin aspart  0-5 Units Subcutaneous QHS   lidocaine  1 patch Transdermal Q24H   magic mouthwash  10 mL Oral TID   mouth rinse  15 mL Mouth Rinse q12n4p   tamsulosin  0.4 mg Oral QHS   Tbo-Filgrastim  480 mcg Subcutaneous q1800   Continuous Infusions:   prismasol BGK 4/2.5 400 mL/hr at 08/19/20 2214    prismasol BGK 4/2.5 200 mL/hr at 08/19/20 2214   sodium chloride 5 mL/hr at 08/20/20 0700   prismasol BGK 4/2.5 1,800 mL/hr at 08/20/20 0713   promethazine (PHENERGAN) injection (IM or IVPB)       LOS: 9 days   Time spent: 35 minutes. More than 50% of the time was spent in counseling/coordination of care  Lorella Nimrod, MD [facility]  If 7PM-7AM, please contact night-coverage [email or web address]  08/20/2020, 7:32 AM   This record has been created using Systems analyst. Errors have been sought and corrected,but may not always be located. Such creation errors do not reflect on the standard of care.

## 2020-08-20 NOTE — Progress Notes (Signed)
Subjective:    On CRRT overnight, BP's stable, - 815 I/O, UOP 1100 cc yest,  creat down to 4.7 today w/ BUN 69.  K 3.1.   Patient not seen directly today given COVID-19 + status, utilizing data taken from chart +/- discussions w/ providers and staff.   Per pmd pt is still quite lethargic but arouses and answers questions   Objective Vital signs in last 24 hours: Vitals:   08/20/20 1000 08/20/20 1100 08/20/20 1200 08/20/20 1300  BP: 134/63 128/75 (!) 130/55 (!) 124/59  Pulse: 85 74 78 76  Resp: $Remo'17 18 19 18  'WZxwH$ Temp:   98 F (36.7 C)   TempSrc:   Oral   SpO2: 95% 97% 97% 98%  Weight:      Height:       Weight change: -4.3 kg  Intake/Output Summary (Last 24 hours) at 08/20/2020 1321 Last data filed at 08/20/2020 1300 Gross per 24 hour  Intake 2512.2 ml  Output 3023.5 ml  Net -511.3 ml     Assessment/ Plan: Pt is a 75 y.o. yo male with multiple myeloma who was admitted on 08/11/2020 with COVID-  A on CRT-  crt 1.7 in Jan 2022-  now 7- non oliguric   Assessment/Plan: 1. A on CRF-  baseline creat 1.7 in Jan 2022. Possible ACEi's prior to admit. Renal u/s neg for obstruction. UA unrevealing. UOP good.   AKI Could be ATN from hypotension on ACE.  Myeloma kidney is also in the differential, also could be COVID related.  Initially pt was refusing dialysis, but changed his mind. Due to decline in responsiveness he was started on CRRT here yesterday in the ICU on 08/19/20.  Creat improving w/ CRRT. Cont for now, watch to see if improving azotemia will improve his MS.  2. HTN/volume-  wt's and I/O's are up 8-10 L/ kg, mild edema on exam. Cont neg 50 cc/hr, BP's stable.  3. Anemia-  blood transfusion 6/23-  due to myeloma and also AKI-  stable to improved 4. Hypocalcemia-  corrected is around 7.7-  no sxms - no action needed -  got bolus during hosp 5. Hypokalemia- replace prn   Kelly Splinter, MD 08/20/2020, 1:21 PM       Labs: Basic Metabolic Panel: Recent Labs  Lab 08/19/20 0346  08/19/20 1629 08/20/20 0500  NA 131* 132* 133*  K 3.2* 3.3* 3.1*  CL 97* 98 101  CO2 23 21* 25  GLUCOSE 146* 101* 167*  BUN 95* 95* 69*  CREATININE 7.13* 7.08* 4.74*  CALCIUM 6.6* 6.8* 6.8*  PHOS 3.9 4.6 3.7    Liver Function Tests: Recent Labs  Lab 08/18/20 0316 08/19/20 0346 08/19/20 1629 08/20/20 0500  AST 15 16  --  18  ALT 14 14  --  13  ALKPHOS 37* 38  --  35*  BILITOT 0.5 0.6  --  0.8  PROT 4.9* 5.1*  --  5.3*  ALBUMIN 1.9* 1.8* 1.8* 1.9*  1.9*    No results for input(s): LIPASE, AMYLASE in the last 168 hours. No results for input(s): AMMONIA in the last 168 hours. CBC: Recent Labs  Lab 08/17/20 0312 08/18/20 0316 08/19/20 0346 08/20/20 0000 08/20/20 0500  WBC 1.2* 1.3* 1.7* 2.3* 2.6*  NEUTROABS  --  0.4* 0.8* 1.2* 1.5*  HGB 8.9* 8.4* 7.5* 7.9* 8.1*  HCT 25.8* 25.3* 22.4* 23.2* 23.5*  MCV 91.5 94.4 93.3 91.0 91.4  PLT 27* 23* 19* 35* 33*    Cardiac Enzymes:  No results for input(s): CKTOTAL, CKMB, CKMBINDEX, TROPONINI in the last 168 hours. CBG: Recent Labs  Lab 08/19/20 2007 08/19/20 2309 08/20/20 0353 08/20/20 0745 08/20/20 1134  GLUCAP 101* 121* 159* 149* 173*     Iron Studies:  No results for input(s): IRON, TIBC, TRANSFERRIN, FERRITIN in the last 72 hours.  Studies/Results: DG Abd 1 View  Result Date: 08/19/2020 CLINICAL DATA:  NG tube placement EXAM: ABDOMEN - 1 VIEW COMPARISON:  None. FINDINGS: Feeding tube is in place with the tip in the right upper abdomen. This could be in the distal stomach or proximal duodenum. IMPRESSION: Feeding tube tip in the distal stomach or proximal duodenum. Electronically Signed   By: Rolm Baptise M.D.   On: 08/19/2020 20:15   CT HEAD WO CONTRAST  Result Date: 08/19/2020 CLINICAL DATA:  Mental status change.  COVID-19 positive. EXAM: CT HEAD WITHOUT CONTRAST TECHNIQUE: Contiguous axial images were obtained from the base of the skull through the vertex without intravenous contrast. COMPARISON:  None.  FINDINGS: Brain: There is extensive encephalomalacia in the right occipital and posterior right temporal lobes consistent with a chronic PCA infarct with associated dystrophic calcification and ex vacuo dilatation of the right lateral ventricle. There is also encephalomalacia at the right temporoparietal junction (posterior MCA territory/border zone). A small cortical infarct in the left occipital lobe is also felt to be chronic. No acute large territory infarct, intracranial hemorrhage, mass, midline shift, or extra-axial fluid collection is identified. Vascular: Calcified atherosclerosis at the skull base. No hyperdense vessel. Skull: No fracture or suspicious osseous lesion. Sinuses/Orbits: Extensive opacification of the paranasal sinuses bilaterally by combination of mucosal thickening and fluid. Small bilateral mastoid effusions. Bilateral cataract extraction. Other: Partially visualized nasoenteric tube. IMPRESSION: 1. No evidence of acute intracranial abnormality. 2. 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 Electronically Signed   By: Logan Bores M.D.   On: 08/19/2020 18:24   DG CHEST PORT 1 VIEW  Result Date: 08/19/2020 CLINICAL DATA:  Central line placement EXAM: PORTABLE CHEST 1 VIEW COMPARISON:  08/11/2020 FINDINGS: Right central line is been placed with the tip in the SVC. No pneumothorax. Heart is normal size. Left basilar atelectasis or infiltrate, new since prior study. No visible effusions. OG tube is seen entering the stomach. The distal aspect of the tube is not visualized. IMPRESSION: Right central line tip in the SVC.  No pneumothorax. New left lower lobe atelectasis or infiltrate. Electronically Signed   By: Rolm Baptise M.D.   On: 08/19/2020 20:14   Medications: Infusions:   prismasol BGK 4/2.5 400 mL/hr at 08/20/20 1102    prismasol BGK 4/2.5 200 mL/hr at 08/19/20 2214   sodium chloride 5 mL/hr at 08/20/20 1300   prismasol BGK 4/2.5 1,800 mL/hr at 08/20/20 1223    promethazine (PHENERGAN) injection (IM or IVPB)      Scheduled Medications:  sodium chloride   Intravenous Once   calcium carbonate  1,000 mg of elemental calcium Per Tube BID WC   chlorhexidine  15 mL Mouth Rinse BID   Chlorhexidine Gluconate Cloth  6 each Topical Q0600   feeding supplement (NEPRO CARB STEADY)  1,000 mL Per Tube Q24H   feeding supplement (NEPRO CARB STEADY)  237 mL Per Tube Q24H   feeding supplement (PROSource TF)  45 mL Per Tube Daily   finasteride  5 mg Oral Daily   free water  50 mL Per Tube 6 X Daily   insulin aspart  0-20 Units Subcutaneous TID WC  insulin aspart  0-5 Units Subcutaneous QHS   lidocaine  1 patch Transdermal Q24H   magic mouthwash  10 mL Oral TID   mouth rinse  15 mL Mouth Rinse q12n4p   tamsulosin  0.4 mg Oral QHS   Tbo-Filgrastim  480 mcg Subcutaneous q1800    have reviewed scheduled and prn medications.  Physical Exam: Gen alert, no distress. Very lethargic, answers simple questions No rash, cyanosis or gangrene Sclera anicteric, throat clear  No jvd or bruits Chest clear bilat to bases, no rales/ wheezing RRR no MRG Abd soft ntnd no mass or ascites +bs GU normal MS no joint effusions or deformity Ext 1+ edema LE"s. no wounds or ulcers Neuro is lethargic, very weak

## 2020-08-20 NOTE — Progress Notes (Signed)
   NAME:  Jeff Rodriguez, MRN:  [id number], DOB:  [birth year], LOS: 9 ADMISSION DATE:  08/11/2020, CONSULTATION DATE:  08/19/2020 REFERRING MD:  Dr. Dickie La , CHIEF COMPLAINT:  Acute renal failure with need for CRRT  History of Present Illness:  History from chart review due to encephalopathy. Jeff Rodriguez is a 75 y.o. male with a PMH significant for multiple myeloma on [name], HTN, Diabetes, and Asthma who presented to the ED for complaints of productive cough, poor oral intake, and general malaise.  Work-up on admission revealed positive COVID-19 with associated hyponatremia, hypokalemia, and significant renal failure.  He was admitted per hospitalist service with nephrology consulted.  Afternoon of 6/29 patient was seen more somnolent c in the setting of underlying renal failure prompting nephrology to initiate renal replacement therapy.  Given limited bed availability at [facility] decision was made to initiate CRRT here at [facility] therefore PCCM was consulted for dialysis catheter placement and CRRT.  Pertinent  Medical History  Multiple myeloma on [name], HTN, Diabetes, and Asthma  Significant Hospital Events:  6/21 Admitted COVID positive 6/29 PCCM consulted for HD cath placement and CRRT   Interim History / Subjective:  No events. Looks/feels better. On -50/hr with CRRT, also making urine.  Objective   Blood pressure (!) 145/87, pulse 90, temperature 97.8 F (36.6 C), temperature source Oral, resp. rate 19, height $RemoveBe'5\' 8"'lXWkUqDMM$  (1.727 m), weight 71.6 kg, SpO2 99 %.        Intake/Output Summary (Last 24 hours) at 08/20/2020 4627 Last data filed at 08/20/2020 0800 Gross per 24 hour  Intake 1587.15 ml  Output 2265.8 ml  Net -678.65 ml    Filed Weights   08/18/20 0411 08/19/20 0451 08/20/20 0423  Weight: 75.5 kg 75.9 kg 71.6 kg    Examination: Constitutional: thin man in NAD  Eyes: EOMI, pupils equal Ears, nose, mouth, and throat: MM dry, trachea  midline Cardiovascular: RRR, ext warm Respiratory: clear, no wheezing or accessory muscle use Gastrointestinal: soft, +BS Skin: No rashes, normal turgor Neurologic: moves all 4 ext to command Psychiatric: RASS 0  Stable pancytopenia Serum chemistries improved with CRRT Resolved Hospital Problem list     Assessment & Plan:  Acute Kidney Injury superimposed on 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 Completed course of Remdesivir  Hx of HTN/HLD Hx of diabetes  Dysphagia, protein calorie malnutrition- on TF  Seems to have settled out with improved breathing and mental status on CRRT. Work on mobility No pressors, on RA, nothing further to add, please call us if any issues/concerns  Erskine Emery MD PCCM

## 2020-08-21 ENCOUNTER — Inpatient Hospital Stay (HOSPITAL_COMMUNITY): Payer: No Typology Code available for payment source

## 2020-08-21 DIAGNOSIS — D61818 Other pancytopenia: Secondary | ICD-10-CM | POA: Diagnosis not present

## 2020-08-21 DIAGNOSIS — I10 Essential (primary) hypertension: Secondary | ICD-10-CM | POA: Diagnosis not present

## 2020-08-21 DIAGNOSIS — U071 COVID-19: Secondary | ICD-10-CM | POA: Diagnosis not present

## 2020-08-21 DIAGNOSIS — E118 Type 2 diabetes mellitus with unspecified complications: Secondary | ICD-10-CM | POA: Diagnosis not present

## 2020-08-21 DIAGNOSIS — N179 Acute kidney failure, unspecified: Secondary | ICD-10-CM | POA: Diagnosis not present

## 2020-08-21 LAB — CBC WITH DIFFERENTIAL/PLATELET
Abs Immature Granulocytes: 0.09 10*3/uL — ABNORMAL HIGH (ref 0.00–0.07)
Basophils Absolute: 0 10*3/uL (ref 0.0–0.1)
Basophils Relative: 1 %
Eosinophils Absolute: 0.1 10*3/uL (ref 0.0–0.5)
Eosinophils Relative: 2 %
HCT: 23.9 % — ABNORMAL LOW (ref 39.0–52.0)
Hemoglobin: 7.9 g/dL — ABNORMAL LOW (ref 13.0–17.0)
Immature Granulocytes: 2 %
Lymphocytes Relative: 15 %
Lymphs Abs: 0.7 10*3/uL (ref 0.7–4.0)
MCH: 31.1 pg (ref 26.0–34.0)
MCHC: 33.1 g/dL (ref 30.0–36.0)
MCV: 94.1 fL (ref 80.0–100.0)
Monocytes Absolute: 0.2 10*3/uL (ref 0.1–1.0)
Monocytes Relative: 5 %
Neutro Abs: 3.5 10*3/uL (ref 1.7–7.7)
Neutrophils Relative %: 75 %
Platelets: 24 10*3/uL — CL (ref 150–400)
RBC: 2.54 MIL/uL — ABNORMAL LOW (ref 4.22–5.81)
RDW: 17.4 % — ABNORMAL HIGH (ref 11.5–15.5)
WBC: 4.6 10*3/uL (ref 4.0–10.5)
nRBC: 0 % (ref 0.0–0.2)

## 2020-08-21 LAB — RENAL FUNCTION PANEL
Albumin: 1.9 g/dL — ABNORMAL LOW (ref 3.5–5.0)
Albumin: 2 g/dL — ABNORMAL LOW (ref 3.5–5.0)
Anion gap: 4 — ABNORMAL LOW (ref 5–15)
Anion gap: 5 (ref 5–15)
BUN: 33 mg/dL — ABNORMAL HIGH (ref 8–23)
BUN: 25 mg/dL — ABNORMAL HIGH (ref 8–23)
CO2: 30 mmol/L (ref 22–32)
CO2: 26 mmol/L (ref 22–32)
Calcium: 7.3 mg/dL — ABNORMAL LOW (ref 8.9–10.3)
Calcium: 7.1 mg/dL — ABNORMAL LOW (ref 8.9–10.3)
Chloride: 101 mmol/L (ref 98–111)
Chloride: 104 mmol/L (ref 98–111)
Creatinine, Ser: 2.15 mg/dL — ABNORMAL HIGH (ref 0.61–1.24)
Creatinine, Ser: 1.6 mg/dL — ABNORMAL HIGH (ref 0.61–1.24)
GFR, Estimated: 32 mL/min — ABNORMAL LOW (ref >60)
GFR, Estimated: 45 mL/min — ABNORMAL LOW (ref >60)
Glucose, Bld: 182 mg/dL — ABNORMAL HIGH (ref 70–99)
Glucose, Bld: 138 mg/dL — ABNORMAL HIGH (ref 70–99)
Phosphorus: 1.9 mg/dL — ABNORMAL LOW (ref 2.5–4.6)
Phosphorus: 2.1 mg/dL — ABNORMAL LOW (ref 2.5–4.6)
Potassium: 3.4 mmol/L — ABNORMAL LOW (ref 3.5–5.1)
Potassium: 4 mmol/L (ref 3.5–5.1)
Sodium: 135 mmol/L (ref 135–145)
Sodium: 135 mmol/L (ref 135–145)

## 2020-08-21 LAB — HEPATIC FUNCTION PANEL
ALT: 12 U/L (ref 0–44)
AST: 18 U/L (ref 15–41)
Albumin: 1.9 g/dL — ABNORMAL LOW (ref 3.5–5.0)
Alkaline Phosphatase: 39 U/L (ref 38–126)
Bilirubin, Direct: 0.2 mg/dL (ref 0.0–0.2)
Indirect Bilirubin: 0.4 mg/dL (ref 0.3–0.9)
Total Bilirubin: 0.6 mg/dL (ref 0.3–1.2)
Total Protein: 5.8 g/dL — ABNORMAL LOW (ref 6.5–8.1)

## 2020-08-21 LAB — GLUCOSE, CAPILLARY
Glucose-Capillary: 161 mg/dL — ABNORMAL HIGH (ref 70–99)
Glucose-Capillary: 185 mg/dL — ABNORMAL HIGH (ref 70–99)
Glucose-Capillary: 132 mg/dL — ABNORMAL HIGH (ref 70–99)
Glucose-Capillary: 137 mg/dL — ABNORMAL HIGH (ref 70–99)
Glucose-Capillary: 138 mg/dL — ABNORMAL HIGH (ref 70–99)
Glucose-Capillary: 135 mg/dL — ABNORMAL HIGH (ref 70–99)

## 2020-08-21 LAB — LACTATE DEHYDROGENASE: LDH: 163 U/L (ref 98–192)

## 2020-08-21 LAB — TSH: TSH: 1.1 u[IU]/mL (ref 0.350–4.500)

## 2020-08-21 LAB — C-REACTIVE PROTEIN: CRP: 10.6 mg/dL — ABNORMAL HIGH (ref <1.0)

## 2020-08-21 LAB — APTT: aPTT: 42 seconds — ABNORMAL HIGH (ref 24–36)

## 2020-08-21 LAB — VITAMIN B12: Vitamin B-12: 265 pg/mL (ref 180–914)

## 2020-08-21 LAB — MAGNESIUM: Magnesium: 2.2 mg/dL (ref 1.7–2.4)

## 2020-08-21 MED ORDER — POTASSIUM CHLORIDE 20 MEQ PO PACK: 40.0000 meq | PACK | Freq: Once | ORAL | Status: DC

## 2020-08-21 MED ORDER — MORPHINE SULFATE (PF) 2 MG/ML IV SOLN
2.0000 mg | Freq: Once | INTRAVENOUS | Status: AC
  Administered 2020-08-21: 2 mg via INTRAVENOUS
  Filled 2020-08-21: qty 1

## 2020-08-21 MED ORDER — IPRATROPIUM-ALBUTEROL 20-100 MCG/ACT IN AERS
1.0000 | INHALATION_SPRAY | Freq: Four times a day (QID) | RESPIRATORY_TRACT | Status: DC
  Administered 2020-08-21 – 2020-08-25 (×16): 1 via RESPIRATORY_TRACT
  Filled 2020-08-21 (×2): qty 4

## 2020-08-21 MED ORDER — DARBEPOETIN ALFA 60 MCG/0.3ML IJ SOSY
60.0000 ug | PREFILLED_SYRINGE | INTRAMUSCULAR | Status: DC
  Filled 2020-08-21: qty 0.3

## 2020-08-21 MED ORDER — NEPRO/CARBSTEADY PO LIQD
1000.0000 mL | ORAL | Status: DC
  Administered 2020-08-21 – 2020-08-22 (×2): 1000 mL
  Filled 2020-08-21: qty 1000

## 2020-08-21 MED ORDER — HEPARIN BOLUS VIA INFUSION (CRRT): 1000.0000 [IU] | INTRAVENOUS | Status: DC | PRN

## 2020-08-21 MED ORDER — DARBEPOETIN ALFA 60 MCG/0.3ML IJ SOSY
60.0000 ug | PREFILLED_SYRINGE | INTRAMUSCULAR | Status: DC
  Administered 2020-08-24: 60 ug via INTRAVENOUS
  Filled 2020-08-21 (×2): qty 0.3

## 2020-08-21 MED ORDER — MAGNESIUM SULFATE 2 GM/50ML IV SOLN
2.0000 g | Freq: Once | INTRAVENOUS | Status: AC
  Administered 2020-08-21: 2 g via INTRAVENOUS
  Filled 2020-08-21: qty 50

## 2020-08-21 MED ORDER — PRISMASOL BGK 4/2.5 32-4-2.5 MEQ/L REPLACEMENT SOLN: Status: DC

## 2020-08-21 MED ORDER — SODIUM PHOSPHATES 45 MMOLE/15ML IV SOLN
20.0000 mmol | Freq: Once | INTRAVENOUS | Status: AC
  Administered 2020-08-21: 20 mmol via INTRAVENOUS
  Filled 2020-08-21: qty 6.67

## 2020-08-21 MED ORDER — PROSOURCE TF PO LIQD
45.0000 mL | Freq: Four times a day (QID) | ORAL | Status: DC
  Administered 2020-08-21 – 2020-08-22 (×4): 45 mL
  Filled 2020-08-21 (×4): qty 45

## 2020-08-21 MED ORDER — POTASSIUM CHLORIDE 10 MEQ/100ML IV SOLN
10.0000 meq | INTRAVENOUS | Status: AC
  Administered 2020-08-21 (×4): 10 meq via INTRAVENOUS
  Filled 2020-08-21 (×4): qty 100

## 2020-08-21 MED ORDER — ALTEPLASE 2 MG IJ SOLR: 2.0000 mg | Freq: Once | INTRAMUSCULAR | Status: DC | PRN

## 2020-08-21 MED ORDER — NEPRO/CARBSTEADY PO LIQD
1000.0000 mL | ORAL | Status: DC
  Filled 2020-08-21: qty 1000

## 2020-08-21 MED ORDER — SODIUM CHLORIDE 0.9 % IV SOLN
300.0000 [IU]/h | INTRAVENOUS | Status: DC
  Administered 2020-08-22: 300 [IU]/h via INTRAVENOUS_CENTRAL
  Filled 2020-08-21 (×2): qty 2

## 2020-08-21 MED ORDER — HEPARIN SODIUM (PORCINE) 1000 UNIT/ML DIALYSIS
1000.0000 [IU] | INTRAMUSCULAR | Status: DC | PRN
  Administered 2020-08-21: 2400 [IU] via INTRAVENOUS_CENTRAL
  Filled 2020-08-21: qty 6

## 2020-08-21 MED ORDER — PROSOURCE TF PO LIQD: 45.0000 mL | Freq: Three times a day (TID) | ORAL | Status: DC

## 2020-08-21 MED ORDER — PRISMASOL BGK 4/2.5 32-4-2.5 MEQ/L EC SOLN: Status: DC

## 2020-08-21 MED ORDER — METOCLOPRAMIDE HCL 5 MG/ML IJ SOLN
5.0000 mg | Freq: Three times a day (TID) | INTRAMUSCULAR | Status: DC
  Administered 2020-08-21 – 2020-08-27 (×17): 5 mg via INTRAVENOUS
  Filled 2020-08-21 (×18): qty 2

## 2020-08-21 MED ORDER — SODIUM CHLORIDE 0.9 % FOR CRRT: INTRAVENOUS_CENTRAL | Status: DC | PRN

## 2020-08-21 NOTE — Progress Notes (Signed)
Daily Progress Note   Patient Name: Jeff Rodriguez       Date: 08/21/2020 DOB: Oct 28, 1945  Age: 75 y.o. MRN#: [id number] Attending Physician: Damita Lack, MD Primary Care Physician: [facility] Admit Date: 08/11/2020  Reason for Consultation/Follow-up: Establishing goals of care  Subjective: I saw and examined Jeff Rodriguez today.  He was lying in bed in no distress.  He appears weak, but he was awake and alert enough to participate in conversation.  He denies any current complaints including pain or shortness of breath.  Shortly after my arrival, his son, Jeff Rodriguez, arrived as well.  Jeff Rodriguez expressed that he feels that his father is much improved today compared to yesterday.  Jeff Rodriguez feels he is more alert and conversational today than he was yesterday and tells me he is encouraged by what he sees is a significant improvement.  In my discussion with them, both Jeff Rodriguez and his son expressed desire to continue with aggressive interventions.  Jeff Rodriguez expressed being frustrated with visitation as he feels his father would do much better if he were able to have more people come to see him to improve his spirits.  He tells me he is frustrated as he does not understand reasoning for restrictions and feels that it is not in line with [place] law.  I encouraged him to review current [facility] visitation policy that is available online and, if he feels his father is being denied appropriate visitation, he should pursue action as outlined in the section If a patient or family member feels their visitation rights have been violated.  Length of Stay: 10  Current Medications: Scheduled Meds:  . sodium chloride   Intravenous Once  . calcium carbonate  1,000 mg of elemental calcium Per Tube BID WC  .  chlorhexidine  15 mL Mouth Rinse BID  . Chlorhexidine Gluconate Cloth  6 each Topical Q0600  . [START ON 08/24/2020] darbepoetin (ARANESP) injection - NON-DIALYSIS  60 mcg Subcutaneous Q Mon-1800  . feeding supplement (NEPRO CARB STEADY)  1,000 mL Per Tube Q24H  . feeding supplement (NEPRO CARB STEADY)  237 mL Per Tube Q24H  . feeding supplement (PROSource TF)  45 mL Per Tube Daily  . finasteride  5 mg Oral Daily  . free water  50 mL Per Tube 6 X Daily  .  insulin aspart  0-20 Units Subcutaneous TID WC  . insulin aspart  0-5 Units Subcutaneous QHS  . Ipratropium-Albuterol  1 puff Inhalation Q6H  . lidocaine  1 patch Transdermal Q24H  . magic mouthwash  10 mL Oral TID  . mouth rinse  15 mL Mouth Rinse q12n4p  . potassium chloride  40 mEq Oral Once  . tamsulosin  0.4 mg Oral QHS    Continuous Infusions: .  prismasol BGK 4/2.5 400 mL/hr at 08/21/20 0618  .  prismasol BGK 4/2.5 200 mL/hr at 08/20/20 1757  . sodium chloride 5 mL/hr at 08/21/20 0700  . heparin 10,000 units/ 20 mL infusion syringe 300 Units/hr (08/20/20 1746)  . magnesium sulfate bolus IVPB    . prismasol BGK 4/2.5 1,800 mL/hr at 08/21/20 0539  . promethazine (PHENERGAN) injection (IM or IVPB) Stopped (08/21/20 0559)    PRN Meds: sodium chloride, acetaminophen, albuterol, alteplase, alum & mag hydroxide-simeth, chlorpheniramine-HYDROcodone, guaiFENesin-dextromethorphan, heparin, ondansetron **OR** ondansetron (ZOFRAN) IV, phenol, promethazine (PHENERGAN) injection (IM or IVPB), sodium chloride  Physical Exam         General: Alert, awake, in no acute distress.  Weak and frail appearing. HEENT: NGT in place Heart: Regular rate and rhythm. No murmur appreciated. Lungs: Fair air movement, clear Abdomen: Soft, nontender, nondistended, positive bowel sounds.   Ext: No significant edema Skin: Warm and dry  Vital Signs: BP (!) 141/62 (BP Location: Left Arm)   Pulse 91   Temp 98.2 F (36.8 C) (Axillary)   Resp (!) 22   Ht  $R'5\' 8"'Rr$  (1.727 m)   Wt 70.2 kg   SpO2 95%   BMI 23.53 kg/m  SpO2: SpO2: 95 % O2 Device: O2 Device: Room Air O2 Flow Rate: O2 Flow Rate (L/min): 0 L/min  Intake/output summary:  Intake/Output Summary (Last 24 hours) at 08/21/2020 0742 Last data filed at 08/21/2020 0700 Gross per 24 hour  Intake 2502.96 ml  Output 3731.8 ml  Net -1228.84 ml   LBM: Last BM Date: 08/18/20 Baseline Weight: Weight: 90.7 kg Most recent weight: Weight: 70.2 kg       Palliative Assessment/Data:    Flowsheet Rows    Flowsheet Row Most Recent Value  Intake Tab   Referral Department Hospitalist  Unit at Time of Referral Med/Surg Unit  Palliative Care Primary Diagnosis Cancer  Date Notified 08/16/20  Palliative Care Type New Palliative care  Reason for referral Clarify Goals of Care  Date of Admission 08/11/20  Date first seen by Palliative Care 08/18/20  # of days Palliative referral response time 2 Day(s)  # of days IP prior to Palliative referral 5  Clinical Assessment   Palliative Performance Scale Score 30%  Psychosocial & Spiritual Assessment   Palliative Care Outcomes   Patient/Family meeting held? Yes  Who was at the meeting? Patient, son via phone       Patient Active Problem List   Diagnosis Date Noted  . Failure to thrive in adult 08/15/2020  . Multiple myeloma ([place]) 08/12/2020  . Acute renal failure superimposed on stage 3b chronic kidney disease ([place]) 08/11/2020  . COVID-19 virus infection 08/11/2020  . Hypokalemia 08/11/2020  . Pancytopenia ([place]) 08/11/2020  . Cholecystitis, acute s/p lap chole 04/23/15 04/24/2015  . Chest pain 04/20/2015  . Chest wall pain 04/20/2015  . DM2 (diabetes mellitus, type 2) ([place]) 04/20/2015  . Hyperlipidemia 04/20/2015  . BPH (benign prostatic hyperplasia) 04/20/2015  . Diabetes mellitus with complication ([place])   . Essential hypertension  Palliative Care Assessment & Plan   Patient Profile: 75 y.o. male  with past medical history of  multiple myeloma, diabetes, hypertension, asthma, hyperlipidemia and osteoarthritis admitted on 08/11/2020 with AKI on CKD, pancytopenia, failure to thrive, and COVID-19 infection.  He has been evaluated by nephrology and noted need for consideration for dialysis.  Previously, patient had expressed that he would not be interested in dialysis, however, he is now stating that he is agreeable to trial of dialysis.  Palliative consulted for goals of care.  Recommendations/Plan: Full code/full scope Jeff Rodriguez and his son both express desire to continue with any and all offered interventions.  His son expresses that his mental status is improving today and they desire to continue with renal replacement therapy at this time. Jeff Rodriguez son has concerns about visitation policy.  I encouraged him to review policy online and reach out to appropriate outlined resources if he feels that his father is being denied appropriate visitation.  He reports that he was given number for office of patient experience and is planning to call them to discuss further.  Goals of Care and Additional Recommendations: Limitations on Scope of Treatment: Full Scope Treatment  Code Status:    Code Status Orders  (From admission, onward)           Start     Ordered   08/11/20 2257  Full code  Continuous        08/11/20 2256           Code Status History     Date Active Date Inactive Code Status Order ID Comments User Context   04/20/2015 0853 04/25/2015 1400 Full Code [id number]  Rondel Jumbo PA-C ED       Prognosis: Guarded  Discharge Planning: To Be Determined  Care plan was discussed with patient, son.  Thank you for allowing the Palliative Medicine Team to assist in the care of this patient.   Time In: 1620 Time Out: 1700 Total Time 40 Prolonged Time Billed No      Greater than 50%  of this time was spent counseling and coordinating care related to the above assessment and plan.  Micheline Rough, MD  Please contact Palliative Medicine Team phone at [phone] for questions and concerns.

## 2020-08-21 NOTE — Progress Notes (Signed)
Pt vomited, tube feeds held, phenergan PRN order given. MD made aware

## 2020-08-21 NOTE — Progress Notes (Signed)
SLP Cancellation Note  Patient Details Name: Jeff Rodriguez MRN: [id number] DOB: 05-Apr-1945   Cancelled treatment:       Reason Eval/Treat Not Completed: Other (comment);Medical issues which prohibited therapy (RN reports pt not tolerating po due to vomiting, will continue efforts)  Kathleen Lime, MS [facility] SLP Acute Rehab Services Office [phone] Pager [phone]  Jeff Rodriguez 08/21/2020, 8:33 AM

## 2020-08-21 NOTE — Progress Notes (Addendum)
PROGRESS NOTE    Jeff Rodriguez  PJS:[id number] DOB: 1945-07-15 DOA: 08/11/2020 PCP: [facility]   Brief Narrative:  75 y.o. male with medical history significant of multiple myeloma (on [name]), diabetes, hypertension, asthma, hyperlipidemia and osteoarthritis who was brought in by EMS secondary to productive cough, poor oral intake and not eating much.  Patient is currently undergoing chemo for his multiple myeloma. Tested positive for COVID 19, found to have hypokalemic and acute renal failure.  Nephrology team following.  Due to need repeat thrombocytopenia, oncology consulted.  Neupogen given.  Bone scan 08/18/2020 negative for lytic lesions.  Due to poor prognosis, palliative care team consulted and patient decided for CRRT.  Per nephrology patient to be transferred to [facility] for HD.  Status post 1 unit PRBC and platelets 6/30.   Assessment & Plan:   Principal Problem:   Acute renal failure superimposed on stage 3b chronic kidney disease ([place]) Active Problems:   Hyperlipidemia   Diabetes mellitus with complication ([place])   Essential hypertension   COVID-19 virus infection   Hypokalemia   Pancytopenia (HCC)   Multiple myeloma (HCC)   Failure to thrive in adult  AKI with CKD stage IIIb.  Baseline creatinine appears to be around 1.7-2.  Oligoanuric.  Creatinine initially remained above 7.  ATN versus dehydration versus medication induced.  Renal ultrasound negative. -Nephrology team following.  Currently on dialysis awaiting transfer to [facility].  CRRT initiated 6/29. - Creatinine today 2.15.  Acute metabolic encephalopathy - CT head negative.  Lethargic but oriented  Nausea vomiting - Improved.  Tube feeds stopped.  Abdominal x-ray is negative.  Perhaps will attempt to restart tube feeds at a slow rate once his nausea is improved. Reglan started for motility.    Pancytopenia.   -No obvious evidence of bleeding.  Initially on meropenem now  discontinued 6/28.  Urine cultures growing multiple flora - Status post PRBC and platelet transfusion - Hematology following.  Neupogen   Failure to thrive.  Most likely multiple factorial with underlying malignancy, recent COVID infection and renal failure. -Currently getting tube feeds 6/27.  Monitor for refeeding syndrome   COVID-19 infection 6/21.  On room air. Completed the course of remdesivir. -Flutter valve, incentive spirometer.  Out of bed to chair.   Multiple myeloma. -[name] is on hold.  Bone scan negative for any lytic lesion.  Oncology following -Bone scan without any new lytic lesions today. -Cytometry ordered   Essential hypertension.  Blood pressure within goal. -Continue current management.   Diabetes mellitus. -Continue with SSI   DVT prophylaxis: Subcu heparin Code Status: Full code Family Communication:  Son [name] - Updated.   Status is: Inpatient  Remains inpatient appropriate because:Inpatient level of care appropriate due to severity of illness.  He will need to be transferred to [facility] for possibility of hemodialysis.  Discussed with nephrology.  Dispo: The patient is from: Home              Anticipated d/c is to: SNF              Patient currently is not medically stable to d/c.   Difficult to place patient No       Nutritional status  Nutrition Problem: Increased nutrient needs Etiology: acute illness (COVID-19)  Signs/Symptoms: estimated needs  Interventions: Tube feeding, Prostat  Body mass index is 23.53 kg/m.      Subjective: Overnight episodes of nausea and vomiting.  Tube feeds were stopped.  Patient seen and examined at bedside, does not have any complaints but overall feels very tired.  Review of Systems Otherwise negative except as per HPI, including: General: Denies fever, chills, night sweats or unintended weight loss. Resp: Denies cough, wheezing, shortness of breath. Cardiac: Denies chest pain,  palpitations, orthopnea, paroxysmal nocturnal dyspnea. GI: Denies abdominal pain, nausea, vomiting, diarrhea or constipation GU: Denies dysuria, frequency, hesitancy or incontinence MS: Denies muscle aches, joint pain or swelling Neuro: Denies headache, neurologic deficits (focal weakness, numbness, tingling), abnormal gait Psych: Denies anxiety, depression, SI/HI/AVH Skin: Denies new rashes or lesions ID: Denies sick contacts, exotic exposures, travel  Examination:  Constitutional: Not in acute distress, appears chronically ill Respiratory: Clear to auscultation bilaterally Cardiovascular: Normal sinus rhythm, no rubs Abdomen: Nontender nondistended good bowel sounds Musculoskeletal: No edema noted Skin: No rashes seen Neurologic: CN 2-12 grossly intact.  And nonfocal Psychiatric: Normal judgment and insight. Alert and oriented x 3. Normal mood.  Feeding tube in place-currently tube feeds were paused Hemodialysis catheter noted Condom catheter in place  Objective: Vitals:   08/21/20 0400 08/21/20 0500 08/21/20 0600 08/21/20 0700  BP: (!) 157/80 (!) 122/50 (!) 119/44 (!) 141/62  Pulse: (!) 107 80 88 91  Resp: 17 16 (!) 22 (!) 22  Temp: 98.2 F (36.8 C)     TempSrc: Axillary     SpO2: 96% 94% 92% 95%  Weight:  70.2 kg    Height:        Intake/Output Summary (Last 24 hours) at 08/21/2020 0720 Last data filed at 08/21/2020 0700 Gross per 24 hour  Intake 2502.96 ml  Output 3731.8 ml  Net -1228.84 ml   Filed Weights   08/19/20 0451 08/20/20 0423 08/21/20 0500  Weight: 75.9 kg 71.6 kg 70.2 kg     Data Reviewed:   CBC: Recent Labs  Lab 08/18/20 0316 08/19/20 0346 08/20/20 0000 08/20/20 0500 08/21/20 0530  WBC 1.3* 1.7* 2.3* 2.6* 4.6  NEUTROABS 0.4* 0.8* 1.2* 1.5* 3.5  HGB 8.4* 7.5* 7.9* 8.1* 7.9*  HCT 25.3* 22.4* 23.2* 23.5* 23.9*  MCV 94.4 93.3 91.0 91.4 94.1  PLT 23* 19* 35* 33* 24*   Basic Metabolic Panel: Recent Labs  Lab 08/17/20 0312 08/18/20 0316  08/19/20 0346 08/19/20 1629 08/20/20 0500 08/20/20 1857 08/21/20 0530  NA 130* 129* 131* 132* 133* 134* 135  K 3.7 3.4* 3.2* 3.3* 3.1* 3.3* 3.4*  CL 94* 95* 97* 98 101 100 101  CO2 _0 21* _1 GLUCOSE 125* 140* 146* 101* 167* 184* 182*  BUN 97* 96* 95* 95* 69* 45* 33*  CREATININE 7.07* 7.16* 7.13* 7.08* 4.74* 2.99* 2.15*  CALCIUM 6.2* 6.0* 6.6* 6.8* 6.8* 6.8* 7.3*  MG 2.4 2.3 2.1  --  2.1  --  2.2  PHOS 2.9 3.3 3.9 4.6 3.7 3.0 1.9*   GFR: Estimated Creatinine Clearance: 29.2 mL/min (A) (by C-G formula based on SCr of 2.15 mg/dL (H)). Liver Function Tests: Recent Labs  Lab 08/17/20 0312 08/18/20 0316 08/19/20 0346 08/19/20 1629 08/20/20 0500 08/20/20 1857 08/21/20 0530  AST _2 --  18  --  18  ALT _3 --  13  --  12  ALKPHOS 38 37* 38  --  35*  --  39  BILITOT 0.6 0.5 0.6  --  0.8  --  0.6  PROT 5.1* 4.9* 5.1*  --  5.3*  --  5.8*  ALBUMIN 1.8* 1.9* 1.8* 1.8* 1.9*  1.9* 1.9* 1.9*  1.9*   No results for input(s): LIPASE, AMYLASE in the last 168 hours. No results for input(s): AMMONIA in the last 168 hours. Coagulation Profile: Recent Labs  Lab 08/19/20 1629  INR 1.2   Cardiac Enzymes: No results for input(s): CKTOTAL, CKMB, CKMBINDEX, TROPONINI in the last 168 hours. BNP (last 3 results) No results for input(s): PROBNP in the last 8760 hours. HbA1C: No results for input(s): HGBA1C in the last 72 hours. CBG: Recent Labs  Lab 08/20/20 1134 08/20/20 1548 08/20/20 1948 08/20/20 2308 08/21/20 0308  GLUCAP 173* 133* 179* 177* 161*   Lipid Profile: No results for input(s): CHOL, HDL, LDLCALC, TRIG, CHOLHDL, LDLDIRECT in the last 72 hours. Thyroid Function Tests: No results for input(s): TSH, T4TOTAL, FREET4, T3FREE, THYROIDAB in the last 72 hours. Anemia Panel: No results for input(s): VITAMINB12, FOLATE, FERRITIN, TIBC, IRON, RETICCTPCT in the last 72 hours. Sepsis Labs: Recent Labs  Lab 08/15/20 0417 08/16/20 0326 08/17/20 0312   PROCALCITON 1.12 1.03 0.71    Recent Results (from the past 240 hour(s))  Resp Panel by RT-PCR (Flu A&B, Covid) Nasopharyngeal Swab     Status: Abnormal   Collection Time: 08/11/20  3:50 PM   Specimen: Nasopharyngeal Swab; Nasopharyngeal(NP) swabs in vial transport medium  Result Value Ref Range Status   SARS Coronavirus 2 by RT PCR POSITIVE (A) NEGATIVE Final    Comment: RESULT CALLED TO, READ BACK BY AND VERIFIED WITH: M.BELFI AT 1949 ON 06.21.22 BY N.THOMPSON (NOTE) SARS-CoV-2 target nucleic acids are DETECTED.  The SARS-CoV-2 RNA is generally detectable in upper respiratory specimens during the acute phase of infection. Positive results are indicative of the presence of the identified virus, but do not rule out bacterial infection or co-infection with other pathogens not detected by the test. Clinical correlation with patient history and other diagnostic information is necessary to determine patient infection status. The expected result is Negative.  Fact Sheet for Patients: EntrepreneurPulse.com.au  Fact Sheet for Healthcare Providers: IncredibleEmployment.be  This test is not yet approved or cleared by the [place] FDA and  has been authorized for detection and/or diagnosis of SARS-CoV-2 by FDA under an Emergency Use Authorization (EUA).  This EUA will remain in effect (meaning this tes t can be used) for the duration of  the COVID-19 declaration under Section 564(b)(1) of the Act, 21 U.S.C. section 360bbb-3(b)(1), unless the authorization is terminated or revoked sooner.     Influenza A by PCR NEGATIVE NEGATIVE Final   Influenza B by PCR NEGATIVE NEGATIVE Final    Comment: (NOTE) The Xpert Xpress SARS-CoV-2/FLU/RSV plus assay is intended as an aid in the diagnosis of influenza from Nasopharyngeal swab specimens and should not be used as a sole basis for treatment. Nasal washings and aspirates are unacceptable for Xpert Xpress  SARS-CoV-2/FLU/RSV testing.  Fact Sheet for Patients: EntrepreneurPulse.com.au  Fact Sheet for Healthcare Providers: IncredibleEmployment.be  This test is not yet approved or cleared by the [place] FDA and has been authorized for detection and/or diagnosis of SARS-CoV-2 by FDA under an Emergency Use Authorization (EUA). This EUA will remain in effect (meaning this test can be used) for the duration of the COVID-19 declaration under Section 564(b)(1) of the Act, 21 U.S.C. section 360bbb-3(b)(1), unless the authorization is terminated or revoked.  Performed at [facility], [place] [address], [place], [place] [address]   Blood Culture (routine x 2)     Status: None   Collection Time: 08/11/20  3:50 PM  Specimen: BLOOD  Result Value Ref Range Status   Specimen Description   Final    BLOOD BLOOD RIGHT FOREARM Performed at [facility] [address], [place], [place] [address]    Special Requests   Final    BOTTLES DRAWN AEROBIC AND ANAEROBIC Blood Culture adequate volume Performed at [facility] [address] [place], [place] [address]    Culture   Final    NO GROWTH 5 DAYS Performed at [facility], [place] [address], [place], [place] [address]    Report Status 08/16/2020 FINAL  Final  Blood Culture (routine x 2)     Status: None   Collection Time: 08/11/20  3:55 PM   Specimen: BLOOD  Result Value Ref Range Status   Specimen Description   Final    BLOOD BLOOD LEFT FOREARM Performed at [facility] [address], [place], [place] [address]    Special Requests   Final    BOTTLES DRAWN AEROBIC AND ANAEROBIC Blood Culture adequate volume Performed at [facility] [address], [place], [place] [address]    Culture   Final    NO GROWTH 5 DAYS Performed at [facility], [place] [address], [place], [place] [address]    Report  Status 08/16/2020 FINAL  Final  Culture, Urine     Status: Abnormal   Collection Time: 08/14/20  3:11 PM   Specimen: Urine, Random  Result Value Ref Range Status   Specimen Description   Final    URINE, RANDOM Performed at [facility] [address] [place], [place] [address]    Special Requests   Final    NONE Performed at [facility], [place] [address], [place], [place] [address]    Culture MULTIPLE SPECIES PRESENT, SUGGEST RECOLLECTION (A)  Final   Report Status 08/15/2020 FINAL  Final  MRSA PCR Screening     Status: None   Collection Time: 08/19/20  5:53 PM  Result Value Ref Range Status   MRSA by PCR NEGATIVE NEGATIVE Final    Comment:        The GeneXpert MRSA Assay (FDA approved for NASAL specimens only), is one component of a comprehensive MRSA colonization surveillance program. It is not intended to diagnose MRSA infection nor to guide or monitor treatment for MRSA infections. Performed at [facility], [place] [address], [place],  [address]          Radiology Studies: DG Abd 1 View  Result Date: 08/19/2020 CLINICAL DATA:  NG tube placement EXAM: ABDOMEN - 1 VIEW COMPARISON:  None. FINDINGS: Feeding tube is in place with the tip in the right upper abdomen. This could be in the distal stomach or proximal duodenum. IMPRESSION: Feeding tube tip in the distal stomach or proximal duodenum. Electronically Signed   By: Rolm Baptise M.D.   On: 08/19/2020 20:15   CT HEAD WO CONTRAST  Result Date: 08/19/2020 CLINICAL DATA:  Mental status change.  COVID-19 positive. EXAM: CT HEAD WITHOUT CONTRAST TECHNIQUE: Contiguous axial images were obtained from the base of the skull through the vertex without intravenous contrast. COMPARISON:  None. FINDINGS: Brain: There is extensive encephalomalacia in the right occipital and posterior right temporal lobes consistent with a chronic PCA infarct with associated dystrophic  calcification and ex vacuo dilatation of the right lateral ventricle. There is also encephalomalacia at the right temporoparietal junction (posterior MCA territory/border zone). A small cortical  infarct in the left occipital lobe is also felt to be chronic. No acute large territory infarct, intracranial hemorrhage, mass, midline shift, or extra-axial fluid collection is identified. Vascular: Calcified atherosclerosis at the skull base. No hyperdense vessel. Skull: No fracture or suspicious osseous lesion. Sinuses/Orbits: Extensive opacification of the paranasal sinuses bilaterally by combination of mucosal thickening and fluid. Small bilateral mastoid effusions. Bilateral cataract extraction. Other: Partially visualized nasoenteric tube. IMPRESSION: 1. No evidence of acute intracranial abnormality. 2. 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 Electronically Signed   By: Logan Bores M.D.   On: 08/19/2020 18:24   DG CHEST PORT 1 VIEW  Result Date: 08/19/2020 CLINICAL DATA:  Central line placement EXAM: PORTABLE CHEST 1 VIEW COMPARISON:  08/11/2020 FINDINGS: Right central line is been placed with the tip in the SVC. No pneumothorax. Heart is normal size. Left basilar atelectasis or infiltrate, new since prior study. No visible effusions. OG tube is seen entering the stomach. The distal aspect of the tube is not visualized. IMPRESSION: Right central line tip in the SVC.  No pneumothorax. New left lower lobe atelectasis or infiltrate. Electronically Signed   By: Rolm Baptise M.D.   On: 08/19/2020 20:14        Scheduled Meds:  sodium chloride   Intravenous Once   calcium carbonate  1,000 mg of elemental calcium Per Tube BID WC   chlorhexidine  15 mL Mouth Rinse BID   Chlorhexidine Gluconate Cloth  6 each Topical Q0600   [START ON 08/24/2020] darbepoetin (ARANESP) injection - NON-DIALYSIS  60 mcg Subcutaneous Q Mon-1800   feeding supplement (NEPRO CARB STEADY)  1,000 mL Per Tube Q24H    feeding supplement (NEPRO CARB STEADY)  237 mL Per Tube Q24H   feeding supplement (PROSource TF)  45 mL Per Tube Daily   finasteride  5 mg Oral Daily   free water  50 mL Per Tube 6 X Daily   insulin aspart  0-20 Units Subcutaneous TID WC   insulin aspart  0-5 Units Subcutaneous QHS   lidocaine  1 patch Transdermal Q24H   magic mouthwash  10 mL Oral TID   mouth rinse  15 mL Mouth Rinse q12n4p   tamsulosin  0.4 mg Oral QHS   Tbo-Filgrastim  480 mcg Subcutaneous q1800   Continuous Infusions:   prismasol BGK 4/2.5 400 mL/hr at 08/21/20 0618    prismasol BGK 4/2.5 200 mL/hr at 08/20/20 1757   sodium chloride 5 mL/hr at 08/21/20 0700   heparin 10,000 units/ 20 mL infusion syringe 300 Units/hr (08/20/20 1746)   prismasol BGK 4/2.5 1,800 mL/hr at 08/21/20 0539   promethazine (PHENERGAN) injection (IM or IVPB) Stopped (08/21/20 0559)     LOS: 10 days   Time spent= 35 mins    Levonne Carreras Arsenio Loader, MD [facility]  If 7PM-7AM, please contact night-coverage  08/21/2020, 7:20 AM

## 2020-08-21 NOTE — Progress Notes (Signed)
OT Cancellation Note  Patient Details Name: NUNO BRUBACHER MRN: [id number] DOB: 06-23-1945   Cancelled Treatment:    Reason Eval/Treat Not Completed: Medical issues which prohibited therapy. MD requests therapy hold today. Reports patient minimally responsive and potentially being transferred to [facility]. Will f/u with patient as able and when more medically stable for therapy.  [facility] [place] L Nolah Krenzer 08/21/2020, 10:55 AM

## 2020-08-21 NOTE — Progress Notes (Signed)
Subjective:    Looks better today, more alert and responsive, creat down 2.4 Plts 24k, K and phos are low being replaced   Objective Vital signs in last 24 hours: Vitals:   08/21/20 0900 08/21/20 1000 08/21/20 1100 08/21/20 1200  BP: 112/60 (!) 141/73 117/60 129/64  Pulse: 87 (!) 103 89 81  Resp: 20 17 (!) 22 (!) 23  Temp:    98.1 F (36.7 C)  TempSrc:    Axillary  SpO2: 97% 97% 95% 97%  Weight:      Height:       Weight change: -1.4 kg  Intake/Output Summary (Last 24 hours) at 08/21/2020 1240 Last data filed at 08/21/2020 1200 Gross per 24 hour  Intake 2413.89 ml  Output 3535.1 ml  Net -1121.21 ml     Assessment/ Plan: Pt is a 75 y.o. yo male with multiple myeloma who was admitted on 08/11/2020 with COVID-  A on CRT-  crt 1.7 in Jan 2022-  now 7- non oliguric   Assessment/Plan: 1. A on CRF-  baseline creat 1.7 in Jan 2022. Renal u/s neg for obstruction. UA unrevealing. UOP good.  Suspect ATN from hypotension/ ACE.  Myeloma kidney unlikely- per ONC w/u current myeloma testing is negative. Initially pt was refusing dialysis, but changed his mind. Due to decline in responsiveness and persistent azotemia (creat in 7's), pt was started on CRRT 6/29.  Today pt is alert and looks much better. Does not require CRRT, will cont 7a-7p until bed is available at [facility] for iHD.   2. HTN/volume-  wt's up 7kg,  mild edema on exam. Cont neg 50-75 cc/hr, BP's stable.  3. Anemia-  blood transfusion 6/23, Hb in 7-8 range, ordered darbe 60 ug weekly IV to start today 4. Hypocalcemia-  corrected is around 7.7-  no sxms - no action needed -  got bolus during hosp 5. Hypokalemia/ hypophos- getting IV replacements today   Kelly Splinter, MD 08/21/2020, 12:40 PM       Labs: Basic Metabolic Panel: Recent Labs  Lab 08/20/20 0500 08/20/20 1857 08/21/20 0530  NA 133* 134* 135  K 3.1* 3.3* 3.4*  CL 101 100 101  CO2 $Re'25 27 30  'Ovk$ GLUCOSE 167* 184* 182*  BUN 69* 45* 33*  CREATININE 4.74* 2.99* 2.15*   CALCIUM 6.8* 6.8* 7.3*  PHOS 3.7 3.0 1.9*    Liver Function Tests: Recent Labs  Lab 08/19/20 0346 08/19/20 1629 08/20/20 0500 08/20/20 1857 08/21/20 0530  AST 16  --  18  --  18  ALT 14  --  13  --  12  ALKPHOS 38  --  35*  --  39  BILITOT 0.6  --  0.8  --  0.6  PROT 5.1*  --  5.3*  --  5.8*  ALBUMIN 1.8*   < > 1.9*  1.9* 1.9* 1.9*  1.9*   < > = values in this interval not displayed.    No results for input(s): LIPASE, AMYLASE in the last 168 hours. No results for input(s): AMMONIA in the last 168 hours. CBC: Recent Labs  Lab 08/18/20 0316 08/19/20 0346 08/20/20 0000 08/20/20 0500 08/21/20 0530  WBC 1.3* 1.7* 2.3* 2.6* 4.6  NEUTROABS 0.4* 0.8* 1.2* 1.5* 3.5  HGB 8.4* 7.5* 7.9* 8.1* 7.9*  HCT 25.3* 22.4* 23.2* 23.5* 23.9*  MCV 94.4 93.3 91.0 91.4 94.1  PLT 23* 19* 35* 33* 24*    Cardiac Enzymes: No results for input(s): CKTOTAL, CKMB, CKMBINDEX, TROPONINI in the  last 168 hours. CBG: Recent Labs  Lab 08/20/20 1948 08/20/20 2308 08/21/20 0308 08/21/20 0800 08/21/20 1134  GLUCAP 179* 177* 161* 185* 132*     Iron Studies:  No results for input(s): IRON, TIBC, TRANSFERRIN, FERRITIN in the last 72 hours.  Studies/Results: DG Abd 1 View  Result Date: 08/21/2020 CLINICAL DATA:  Check feeding catheter placement and abdominal distension EXAM: ABDOMEN - 1 VIEW COMPARISON:  08/19/2020 FINDINGS: Feeding catheter is noted extending towards the proximal portion of the duodenum. Gaseous distension of the stomach is noted. Scattered large and small bowel gas is noted. Changes of prior vertebral augmentation are again noted. No free air is seen. IMPRESSION: Overall stable appearance of the abdomen. Feeding catheter is noted extending into the proximal duodenum. Electronically Signed   By: Inez Catalina M.D.   On: 08/21/2020 11:18   DG Abd 1 View  Result Date: 08/19/2020 CLINICAL DATA:  NG tube placement EXAM: ABDOMEN - 1 VIEW COMPARISON:  None. FINDINGS: Feeding tube is  in place with the tip in the right upper abdomen. This could be in the distal stomach or proximal duodenum. IMPRESSION: Feeding tube tip in the distal stomach or proximal duodenum. Electronically Signed   By: Rolm Baptise M.D.   On: 08/19/2020 20:15   CT HEAD WO CONTRAST  Result Date: 08/19/2020 CLINICAL DATA:  Mental status change.  COVID-19 positive. EXAM: CT HEAD WITHOUT CONTRAST TECHNIQUE: Contiguous axial images were obtained from the base of the skull through the vertex without intravenous contrast. COMPARISON:  None. FINDINGS: Brain: There is extensive encephalomalacia in the right occipital and posterior right temporal lobes consistent with a chronic PCA infarct with associated dystrophic calcification and ex vacuo dilatation of the right lateral ventricle. There is also encephalomalacia at the right temporoparietal junction (posterior MCA territory/border zone). A small cortical infarct in the left occipital lobe is also felt to be chronic. No acute large territory infarct, intracranial hemorrhage, mass, midline shift, or extra-axial fluid collection is identified. Vascular: Calcified atherosclerosis at the skull base. No hyperdense vessel. Skull: No fracture or suspicious osseous lesion. Sinuses/Orbits: Extensive opacification of the paranasal sinuses bilaterally by combination of mucosal thickening and fluid. Small bilateral mastoid effusions. Bilateral cataract extraction. Other: Partially visualized nasoenteric tube. IMPRESSION: 1. No evidence of acute intracranial abnormality. 2. 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 Electronically Signed   By: Logan Bores M.D.   On: 08/19/2020 18:24   DG CHEST PORT 1 VIEW  Result Date: 08/21/2020 CLINICAL DATA:  COVID-19 positivity with increased cough, initial encounter EXAM: PORTABLE CHEST 1 VIEW COMPARISON:  08/19/2020 FINDINGS: Feeding catheter and right jugular central line are again seen and stable. Postsurgical changes in the  cervical spine are noted. Cardiac shadow is stable. Small basilar effusions are noted. No focal infiltrate or pneumothorax is noted. IMPRESSION: Small bilateral pleural effusions. Tubes and lines as described above stable in appearance. Electronically Signed   By: Inez Catalina M.D.   On: 08/21/2020 08:22   DG CHEST PORT 1 VIEW  Result Date: 08/19/2020 CLINICAL DATA:  Central line placement EXAM: PORTABLE CHEST 1 VIEW COMPARISON:  08/11/2020 FINDINGS: Right central line is been placed with the tip in the SVC. No pneumothorax. Heart is normal size. Left basilar atelectasis or infiltrate, new since prior study. No visible effusions. OG tube is seen entering the stomach. The distal aspect of the tube is not visualized. IMPRESSION: Right central line tip in the SVC.  No pneumothorax. New left lower lobe atelectasis  or infiltrate. Electronically Signed   By: Rolm Baptise M.D.   On: 08/19/2020 20:14   Medications: Infusions:   prismasol BGK 4/2.5      prismasol BGK 4/2.5     sodium chloride 5 mL/hr at 08/21/20 1200   heparin 10,000 units/ 20 mL infusion syringe 300 Units/hr (08/20/20 1746)   heparin 10,000 units/ 20 mL infusion syringe     prismasol BGK 4/2.5     promethazine (PHENERGAN) injection (IM or IVPB) Stopped (08/21/20 0559)   sodium phosphate  Dextrose 5% IVPB      Scheduled Medications:  calcium carbonate  1,000 mg of elemental calcium Per Tube BID WC   chlorhexidine  15 mL Mouth Rinse BID   Chlorhexidine Gluconate Cloth  6 each Topical Q0600   [START ON 08/24/2020] darbepoetin (ARANESP) injection - NON-DIALYSIS  60 mcg Subcutaneous Q Mon-1800   feeding supplement (NEPRO CARB STEADY)  1,000 mL Per Tube Q24H   feeding supplement (PROSource TF)  45 mL Per Tube TID   finasteride  5 mg Oral Daily   free water  50 mL Per Tube 6 X Daily   insulin aspart  0-20 Units Subcutaneous TID WC   insulin aspart  0-5 Units Subcutaneous QHS   Ipratropium-Albuterol  1 puff Inhalation Q6H   lidocaine  1  patch Transdermal Q24H   magic mouthwash  10 mL Oral TID   mouth rinse  15 mL Mouth Rinse q12n4p   metoCLOPramide (REGLAN) injection  5 mg Intravenous Q8H   tamsulosin  0.4 mg Oral QHS    have reviewed scheduled and prn medications.  Physical Exam: Gen alert, no distress. Very lethargic, answers simple questions No rash, cyanosis or gangrene Sclera anicteric, throat clear  No jvd or bruits Chest clear bilat to bases, no rales/ wheezing RRR no MRG Abd soft ntnd no mass or ascites +bs GU normal MS no joint effusions or deformity Ext 1+ edema LE"s. no wounds or ulcers Neuro is lethargic, very weak

## 2020-08-21 NOTE — Progress Notes (Signed)
Mr. Wlodarczyk is on his CRRT.  His renal function is improved quite nicely.  The BUN is 33 and creatinine 2.15.  The LDH is 163.  His albumin is 1.9.  He is on tube feeds.  His CBC showed a white cell count of 4.6.  Hemoglobin 7.9.  Platelet count 24,000.  I will go ahead and stop the Neupogen.  Hopefully this will help with his platelet count.  He did get a dose of Procrit.  This is for his renal insufficiency and anemia secondary to this.  I still have not seen any obvious evidence that the pancytopenia is from his myeloma.  Again, a bone marrow biopsy will certainly help [name] out.  I do not think that this would really be that informative right now.  He still is dealing with COVID.  [name], MD  Lurena Joiner 1:17

## 2020-08-21 NOTE — Progress Notes (Addendum)
Nutrition Follow-up  DOCUMENTATION CODES:   Not applicable  INTERVENTION:  - d/c oral Nepro Shake. - liberalize diet from Renal/Carb Modified to Regular for patient who is consuming minimal PO--okay with MD. - will adjust TF regimen order: Nepro @ 30 ml/hr to advance by 10 ml every 24 hours to reach goal rate of 50 ml/hr with 45 ml Prosource TF QID. - at goal rate, this regimen will provide 1780 kcal (85% kcal need), 117 grams protein, and 654 ml free water.  - free water flush to continue to be per MD.  - recommend addition of prokinetic and/or scheduled anti-emetic   NUTRITION DIAGNOSIS:   Increased nutrient needs related to acute illness (COVID-19) as evidenced by estimated needs. -ongoing  GOAL:   Patient will meet greater than or equal to 90% of their needs -to be met with TF regimen + PO intakes  MONITOR:   TF tolerance, PO intake, Supplement acceptance, Labs, Weight trends  ASSESSMENT:   Pt with a PMH significant of multiple myeloma (currently undergoing chemo), DM, HTN, HLD, asthma, and OA admitted with hypokalemia, AKI as well as COVID-19 infection  Small bore NGT placed in L nare on 6/29 (noted to be in proximal duodenum today). HD cath placed on 6/29 and CRRT started that date. Adjusted estimated nutrition needs accordingly.   He is on a Renal/Carb Modified diet with 1.2L fluid restriction and last documented intake was 10% of breakfast yesterday. He accepted bottle of oral Nepro yesterday.   Order currently in place for TF: Nepro @ 50 ml/hr x18 hours/day (1700-1100) with 45 ml Prosource TF once/day and 50 ml free water every 4 hours. This regimen provides 1660 kcal, 84 grams protein, and 954 ml free water.   Weight trending down since CRRT initiation on 6/29: -12 lb over the past 2 days. Mild pitting edema to BLE documented in the edema section of flow sheet.   Patient discussed in rounds this AM and with RN. Patient had an episode of emesis toward the end of night  shift and again around 0800 today. RN reports that patient continues to feel nauseated as of 1200 today. TF was turned off after episode of emesis at the end of night shift and remained off since that time.   SLP was unable to work with patient today d/t vomiting and inability to tolerate PO d/t this. OT unable to work with patient d/t request by MD for therapy to be held today.  Per notes: - possible transfer to [facility] when a bed is available for iHD - plan to stop CRRT today at 1900. - Palliative Care following and patient and son confirm desire for patient to remain Full Code - hx of multiple myeloma - COVID-19 infection - dysphagia leading to need for small bore NGT and TF   MD requests that TF max rate be 50 ml/hr at this time; will re-assess ability to advance to better meet needs at follow-up.     Labs reviewed; CBGs: 161, 185, 132 mg/dl, K: 3.4 mmol/l, BUN: 33 mg/dl, creatinine: 8.06 mg/dl, Ca: 7.3 mg/dl, Phos: 1.9 mg/dl, GFR: 32 ml/min. Medications reviewed; 1 tablet os-cal BID per NGT, sliding scale novolog, 2 g IV Mg sulfate x1 run 7/1, 10 mEq IV KCl x4 runs 7/1, 20 mmol IV NaPhos x1 run 7/1.    Diet Order:   Diet Order             Diet regular Room service appropriate? Yes; Fluid consistency: Thin  Diet effective now                   EDUCATION NEEDS:   No education needs have been identified at this time  Skin:  Skin Assessment: Reviewed RN Assessment  Last BM:  6/26 (type 6 x2)  Height:   Ht Readings from Last 1 Encounters:  08/11/20 $RemoveB'5\' 8"'[id number]$  (1.727 m)    Weight:   Wt Readings from Last 1 Encounters:  08/21/20 70.2 kg    Estimated Nutritional Needs:  Kcal:  2100-2350 kcal Protein:  110-120 grams Fluid:  >1.7L/d     Jarome Matin, MS, RD, LDN, CNSC Inpatient Clinical Dietitian RD pager # available in [facility]  After hours/weekend pager # available in [facility]

## 2020-08-22 DIAGNOSIS — E118 Type 2 diabetes mellitus with unspecified complications: Secondary | ICD-10-CM | POA: Diagnosis not present

## 2020-08-22 DIAGNOSIS — I10 Essential (primary) hypertension: Secondary | ICD-10-CM | POA: Diagnosis not present

## 2020-08-22 DIAGNOSIS — E872 Acidosis: Secondary | ICD-10-CM

## 2020-08-22 DIAGNOSIS — U071 COVID-19: Secondary | ICD-10-CM | POA: Diagnosis not present

## 2020-08-22 DIAGNOSIS — N179 Acute kidney failure, unspecified: Secondary | ICD-10-CM | POA: Diagnosis not present

## 2020-08-22 LAB — HEPATIC FUNCTION PANEL
ALT: 11 U/L (ref 0–44)
AST: 13 U/L — ABNORMAL LOW (ref 15–41)
Albumin: 2 g/dL — ABNORMAL LOW (ref 3.5–5.0)
Alkaline Phosphatase: 37 U/L — ABNORMAL LOW (ref 38–126)
Bilirubin, Direct: 0.2 mg/dL (ref 0.0–0.2)
Indirect Bilirubin: 0.5 mg/dL (ref 0.3–0.9)
Total Bilirubin: 0.7 mg/dL (ref 0.3–1.2)
Total Protein: 5.5 g/dL — ABNORMAL LOW (ref 6.5–8.1)

## 2020-08-22 LAB — HEMOGLOBIN AND HEMATOCRIT, BLOOD
HCT: 23.5 % — ABNORMAL LOW (ref 39.0–52.0)
Hemoglobin: 7.7 g/dL — ABNORMAL LOW (ref 13.0–17.0)

## 2020-08-22 LAB — CBC
HCT: 21.7 % — ABNORMAL LOW (ref 39.0–52.0)
Hemoglobin: 7 g/dL — ABNORMAL LOW (ref 13.0–17.0)
MCH: 31.3 pg (ref 26.0–34.0)
MCHC: 32.3 g/dL (ref 30.0–36.0)
MCV: 96.9 fL (ref 80.0–100.0)
Platelets: 22 10*3/uL — CL (ref 150–400)
RBC: 2.24 MIL/uL — ABNORMAL LOW (ref 4.22–5.81)
RDW: 17.2 % — ABNORMAL HIGH (ref 11.5–15.5)
WBC: 5.7 10*3/uL (ref 4.0–10.5)
nRBC: 0 % (ref 0.0–0.2)

## 2020-08-22 LAB — RENAL FUNCTION PANEL
Albumin: 1.9 g/dL — ABNORMAL LOW (ref 3.5–5.0)
Albumin: 1.7 g/dL — ABNORMAL LOW (ref 3.5–5.0)
Anion gap: 7 (ref 5–15)
Anion gap: 5 (ref 5–15)
BUN: 35 mg/dL — ABNORMAL HIGH (ref 8–23)
BUN: 31 mg/dL — ABNORMAL HIGH (ref 8–23)
CO2: 27 mmol/L (ref 22–32)
CO2: 27 mmol/L (ref 22–32)
Calcium: 7.1 mg/dL — ABNORMAL LOW (ref 8.9–10.3)
Calcium: 7.1 mg/dL — ABNORMAL LOW (ref 8.9–10.3)
Chloride: 102 mmol/L (ref 98–111)
Chloride: 103 mmol/L (ref 98–111)
Creatinine, Ser: 2.28 mg/dL — ABNORMAL HIGH (ref 0.61–1.24)
Creatinine, Ser: 2.14 mg/dL — ABNORMAL HIGH (ref 0.61–1.24)
GFR, Estimated: 29 mL/min — ABNORMAL LOW (ref >60)
GFR, Estimated: 32 mL/min — ABNORMAL LOW (ref >60)
Glucose, Bld: 154 mg/dL — ABNORMAL HIGH (ref 70–99)
Glucose, Bld: 109 mg/dL — ABNORMAL HIGH (ref 70–99)
Phosphorus: 2.7 mg/dL (ref 2.5–4.6)
Phosphorus: 2.5 mg/dL (ref 2.5–4.6)
Potassium: 3.5 mmol/L (ref 3.5–5.1)
Potassium: 3.7 mmol/L (ref 3.5–5.1)
Sodium: 136 mmol/L (ref 135–145)
Sodium: 135 mmol/L (ref 135–145)

## 2020-08-22 LAB — GLUCOSE, CAPILLARY
Glucose-Capillary: 180 mg/dL — ABNORMAL HIGH (ref 70–99)
Glucose-Capillary: 138 mg/dL — ABNORMAL HIGH (ref 70–99)
Glucose-Capillary: 119 mg/dL — ABNORMAL HIGH (ref 70–99)
Glucose-Capillary: 113 mg/dL — ABNORMAL HIGH (ref 70–99)
Glucose-Capillary: 180 mg/dL — ABNORMAL HIGH (ref 70–99)

## 2020-08-22 LAB — MAGNESIUM: Magnesium: 2.4 mg/dL (ref 1.7–2.4)

## 2020-08-22 LAB — PREPARE RBC (CROSSMATCH)

## 2020-08-22 LAB — AMMONIA: Ammonia: 13 umol/L (ref 9–35)

## 2020-08-22 LAB — APTT: aPTT: 37 seconds — ABNORMAL HIGH (ref 24–36)

## 2020-08-22 MED ORDER — PROSOURCE TF PO LIQD
90.0000 mL | Freq: Two times a day (BID) | ORAL | Status: DC
  Administered 2020-08-22 – 2020-08-26 (×8): 90 mL
  Filled 2020-08-22 (×8): qty 90

## 2020-08-22 MED ORDER — NEPRO/CARBSTEADY PO LIQD
1000.0000 mL | ORAL | Status: AC
  Administered 2020-08-22: 1000 mL
  Filled 2020-08-22: qty 1000

## 2020-08-22 MED ORDER — VITAL 1.5 CAL PO LIQD
1000.0000 mL | ORAL | Status: AC
  Administered 2020-08-22 – 2020-08-23 (×2): 1000 mL
  Filled 2020-08-22 (×4): qty 1000

## 2020-08-22 MED ORDER — FUROSEMIDE 10 MG/ML IJ SOLN
20.0000 mg | Freq: Once | INTRAMUSCULAR | Status: AC
  Administered 2020-08-22: 20 mg via INTRAVENOUS
  Filled 2020-08-22: qty 2

## 2020-08-22 MED ORDER — INSULIN ASPART 100 UNIT/ML IJ SOLN
0.0000 [IU] | INTRAMUSCULAR | Status: DC
  Administered 2020-08-23: 3 [IU] via SUBCUTANEOUS
  Administered 2020-08-23: 5 [IU] via SUBCUTANEOUS
  Administered 2020-08-23 (×3): 3 [IU] via SUBCUTANEOUS
  Administered 2020-08-23: 2 [IU] via SUBCUTANEOUS
  Administered 2020-08-24: 5 [IU] via SUBCUTANEOUS
  Administered 2020-08-24 (×2): 2 [IU] via SUBCUTANEOUS
  Administered 2020-08-24: 5 [IU] via SUBCUTANEOUS
  Administered 2020-08-24: 3 [IU] via SUBCUTANEOUS
  Administered 2020-08-25: 5 [IU] via SUBCUTANEOUS
  Administered 2020-08-25: 3 [IU] via SUBCUTANEOUS
  Administered 2020-08-25 (×3): 2 [IU] via SUBCUTANEOUS
  Administered 2020-08-25 – 2020-08-27 (×6): 3 [IU] via SUBCUTANEOUS
  Administered 2020-08-27: 2 [IU] via SUBCUTANEOUS
  Administered 2020-08-28: 3 [IU] via SUBCUTANEOUS
  Administered 2020-08-28: 2 [IU] via SUBCUTANEOUS
  Administered 2020-08-28: 3 [IU] via SUBCUTANEOUS

## 2020-08-22 MED ORDER — SODIUM CHLORIDE 0.9% IV SOLUTION: Freq: Once | INTRAVENOUS | Status: AC

## 2020-08-22 NOTE — Progress Notes (Signed)
PROGRESS NOTE    Jeff Rodriguez  HQI:[id number] DOB: 18-Jan-1946 DOA: 08/11/2020 PCP: [facility]   Brief Narrative:  75 y.o. male with medical history significant of multiple myeloma (on [name]), diabetes, hypertension, asthma, hyperlipidemia and osteoarthritis who was brought in by EMS secondary to productive cough, poor oral intake and not eating much.  Patient is currently undergoing chemo for his multiple myeloma. Tested positive for COVID 19, found to have hypokalemic and acute renal failure.  Nephrology team following.  Due to need repeat thrombocytopenia, oncology consulted.  Neupogen given.  Bone scan 08/18/2020 negative for lytic lesions.  Due to poor prognosis, palliative care team consulted and patient decided for CRRT.  Per nephrology patient to be transferred to [facility] for HD.  Status post 1 unit PRBC and platelets 6/30.  1 more unit PRBC transfusion ordered 08/21/2020.   Assessment & Plan:   Principal Problem:   Acute renal failure superimposed on stage 3b chronic kidney disease ([place]) Active Problems:   Hyperlipidemia   Diabetes mellitus with complication ([place])   Essential hypertension   COVID-19 virus infection   Hypokalemia   Pancytopenia (HCC)   Multiple myeloma (HCC)   Failure to thrive in adult  AKI with CKD stage IIIb.  Baseline creatinine appears to be around 1.7-2.  Oligoanuric.  Creatinine initially remained above 7.  ATN versus dehydration versus medication induced.  Renal ultrasound negative. -Nephrology team following.  CRRT initiated 6/29, to be continued until transfer to [facility].  Thereafter may be switched to HD. - Creatinine today 1.32  Acute metabolic encephalopathy - CT head negative.  Tired but oriented.  TSH B12 normal.  Check ammonia  Nausea vomiting, resolved - Improved.  Abdominal x-ray negative.  Tube feeds resumed at slower rate   Pancytopenia.  Hemoglobin 7.0, platelets 22 -No obvious evidence of bleeding.  Initially on  meropenem now discontinued 6/28.  Urine cultures growing multiple flora - Status post PRBC and platelet transfusion.  1 more unit PRBC transfusion today - Hematology following.  Neupogen   Failure to thrive.  Most likely multiple factorial with underlying malignancy, recent COVID infection and renal failure. -Currently getting tube feeds 6/27.  Monitor for refeeding syndrome   COVID-19 infection 6/21.  On room air. Completed the course of remdesivir. -Flutter valve, incentive spirometer.  Out of bed to chair.   Multiple myeloma. -[name] is on hold.  Bone scan negative for any lytic lesion.  Oncology following -Bone scan without any new lytic lesions today. -Cytometry Done   Essential hypertension.  Blood pressure within goal. -Continue current management.   Diabetes mellitus. -Continue with SSI   DVT prophylaxis: Subcu heparin Code Status: Full code Family Communication:  Son [name] - Updated Periodically  Status is: Inpatient  Remains inpatient appropriate because:Inpatient level of care appropriate due to severity of illness.  He will need to be transferred to [facility] for possibility of hemodialysis.  Discussed with nephrology.  Dispo: The patient is from: Home              Anticipated d/c is to: SNF              Patient currently is not medically stable to d/c.   Difficult to place patient No       Nutritional status  Nutrition Problem: Increased nutrient needs Etiology: acute illness (COVID-19)  Signs/Symptoms: estimated needs  Interventions: Tube feeding, Prostat  Body mass index is 22.19 kg/m.  Pressure Injury 08/21/20 Coccyx Right;Left;Mid Stage 1 -  Intact skin with non-blanchable redness of a localized area usually over a bony prominence. (Active)  08/21/20 2000  Location: Coccyx  Location Orientation: Right;Left;Mid  Staging: Stage 1 -  Intact skin with non-blanchable redness of a localized area usually over a bony prominence.  Wound  Description (Comments):   Present on Admission:      Subjective: Resting on his bed. Denies any complaints at all. Appears little tired to me, says he didn't rest too well at night.   Review of Systems Otherwise negative except as per HPI, including: General = no fevers, chills, dizziness,  fatigue HEENT/EYES = negative for loss of vision, double vision, blurred vision,  sore throa Cardiovascular= negative for chest pain, palpitation Respiratory/lungs= negative for shortness of breath, cough, wheezing; hemoptysis,  Gastrointestinal= negative for nausea, vomiting, abdominal pain Genitourinary= negative for Dysuria MSK = Negative for arthralgia, myalgias Neurology= Negative for headache, numbness, tingling  Psychiatry= Negative for suicidal and homocidal ideation Skin= Negative for Rash   Examination:  Constitutional: Not in acute distress. Appears chronically Ill Respiratory: Clear to auscultation bilaterally Cardiovascular: Normal sinus rhythm, no rubs Abdomen: Nontender nondistended good bowel sounds Musculoskeletal: No edema noted Skin: No rashes seen Neurologic: CN 2-12 grossly intact.  And nonfocal Psychiatric: Normal judgment and insight. Alert and oriented x 3. Normal mood.    Feeding tube in place-currently tube feeds were paused Hemodialysis catheter noted Condom catheter in place  Objective: Vitals:   08/22/20 0833 08/22/20 0841 08/22/20 0851 08/22/20 0900  BP: (!) 120/50  (!) 121/51 124/64  Pulse: 80  80 78  Resp: $Remo'18  19 20  'RfTMe$ Temp: 98 F (36.7 C) 98.1 F (36.7 C) 98 F (36.7 C)   TempSrc: Axillary Oral Axillary   SpO2: 97%  96% 97%  Weight:      Height:        Intake/Output Summary (Last 24 hours) at 08/22/2020 1041 Last data filed at 08/22/2020 1000 Gross per 24 hour  Intake 1532.74 ml  Output 2488.1 ml  Net -955.36 ml   Filed Weights   08/20/20 0423 08/21/20 0500 08/22/20 0500  Weight: 71.6 kg 70.2 kg 66.2 kg     Data Reviewed:    CBC: Recent Labs  Lab 08/18/20 0316 08/19/20 0346 08/20/20 0000 08/20/20 0500 08/21/20 0530 08/22/20 0533  WBC 1.3* 1.7* 2.3* 2.6* 4.6 5.7  NEUTROABS 0.4* 0.8* 1.2* 1.5* 3.5  --   HGB 8.4* 7.5* 7.9* 8.1* 7.9* 7.0*  HCT 25.3* 22.4* 23.2* 23.5* 23.9* 21.7*  MCV 94.4 93.3 91.0 91.4 94.1 96.9  PLT 23* 19* 35* 33* 24* 22*   Basic Metabolic Panel: Recent Labs  Lab 08/18/20 0316 08/19/20 0346 08/19/20 1629 08/20/20 0500 08/20/20 1857 08/21/20 0530 08/21/20 1555 08/22/20 0533  NA 129* 131*   < > 133* 134* 135 135 136  K 3.4* 3.2*   < > 3.1* 3.3* 3.4* 4.0 3.5  CL 95* 97*   < > 101 100 101 104 102  CO2 23 23   < > $R'25 27 30 26 27  'Vs$ GLUCOSE 140* 146*   < > 167* 184* 182* 138* 154*  BUN 96* 95*   < > 69* 45* 33* 25* 35*  CREATININE 7.16* 7.13*   < > 4.74* 2.99* 2.15* 1.60* 2.28*  CALCIUM 6.0* 6.6*   < > 6.8* 6.8* 7.3* 7.1* 7.1*  MG 2.3 2.1  --  2.1  --  2.2  --  2.4  PHOS 3.3 3.9   < > 3.7 3.0 1.9*  2.1* 2.7   < > = values in this interval not displayed.   GFR: Estimated Creatinine Clearance: 26.6 mL/min (A) (by C-G formula based on SCr of 2.28 mg/dL (H)). Liver Function Tests: Recent Labs  Lab 08/18/20 0316 08/19/20 0346 08/19/20 1629 08/20/20 0500 08/20/20 1857 08/21/20 0530 08/21/20 1555 08/22/20 0533  AST 15 16  --  18  --  18  --  13*  ALT 14 14  --  13  --  12  --  11  ALKPHOS 37* 38  --  35*  --  39  --  37*  BILITOT 0.5 0.6  --  0.8  --  0.6  --  0.7  PROT 4.9* 5.1*  --  5.3*  --  5.8*  --  5.5*  ALBUMIN 1.9* 1.8*   < > 1.9*  1.9* 1.9* 1.9*  1.9* 2.0* 2.0*  1.9*   < > = values in this interval not displayed.   No results for input(s): LIPASE, AMYLASE in the last 168 hours. No results for input(s): AMMONIA in the last 168 hours. Coagulation Profile: Recent Labs  Lab 08/19/20 1629  INR 1.2   Cardiac Enzymes: No results for input(s): CKTOTAL, CKMB, CKMBINDEX, TROPONINI in the last 168 hours. BNP (last 3 results) No results for input(s): PROBNP in  the last 8760 hours. HbA1C: No results for input(s): HGBA1C in the last 72 hours. CBG: Recent Labs  Lab 08/21/20 1134 08/21/20 1521 08/21/20 1930 08/21/20 2327 08/22/20 0801  GLUCAP 132* 137* 138* 135* 180*   Lipid Profile: No results for input(s): CHOL, HDL, LDLCALC, TRIG, CHOLHDL, LDLDIRECT in the last 72 hours. Thyroid Function Tests: Recent Labs    08/21/20 0731  TSH 1.100   Anemia Panel: Recent Labs    08/21/20 0731  VITAMINB12 265   Sepsis Labs: Recent Labs  Lab 08/16/20 0326 08/17/20 0312  PROCALCITON 1.03 0.71    Recent Results (from the past 240 hour(s))  Culture, Urine     Status: Abnormal   Collection Time: 08/14/20  3:11 PM   Specimen: Urine, Random  Result Value Ref Range Status   Specimen Description   Final    URINE, RANDOM Performed at [facility] [address] [place], [place] [address]    Special Requests   Final    NONE Performed at [facility], [place] [address] [place], [place] [address]    Culture MULTIPLE SPECIES PRESENT, SUGGEST RECOLLECTION (A)  Final   Report Status 08/15/2020 FINAL  Final  MRSA PCR Screening     Status: None   Collection Time: 08/19/20  5:53 PM  Result Value Ref Range Status   MRSA by PCR NEGATIVE NEGATIVE Final    Comment:        The GeneXpert MRSA Assay (FDA approved for NASAL specimens only), is one component of a comprehensive MRSA colonization surveillance program. It is not intended to diagnose MRSA infection nor to guide or monitor treatment for MRSA infections. Performed at [facility], [place] [address], [place], [place] [address]          Radiology Studies: DG Abd 1 View  Result Date: 08/21/2020 CLINICAL DATA:  Check feeding catheter placement and abdominal distension EXAM: ABDOMEN - 1 VIEW COMPARISON:  08/19/2020 FINDINGS: Feeding catheter is noted extending towards the proximal portion of the duodenum. Gaseous distension of the  stomach is noted. Scattered large and small bowel gas is noted. Changes of prior vertebral augmentation are again  noted. No free air is seen. IMPRESSION: Overall stable appearance of the abdomen. Feeding catheter is noted extending into the proximal duodenum. Electronically Signed   By: Inez Catalina M.D.   On: 08/21/2020 11:18   DG CHEST PORT 1 VIEW  Result Date: 08/21/2020 CLINICAL DATA:  COVID-19 positivity with increased cough, initial encounter EXAM: PORTABLE CHEST 1 VIEW COMPARISON:  08/19/2020 FINDINGS: Feeding catheter and right jugular central line are again seen and stable. Postsurgical changes in the cervical spine are noted. Cardiac shadow is stable. Small basilar effusions are noted. No focal infiltrate or pneumothorax is noted. IMPRESSION: Small bilateral pleural effusions. Tubes and lines as described above stable in appearance. Electronically Signed   By: Inez Catalina M.D.   On: 08/21/2020 08:22        Scheduled Meds:  calcium carbonate  1,000 mg of elemental calcium Per Tube BID WC   chlorhexidine  15 mL Mouth Rinse BID   Chlorhexidine Gluconate Cloth  6 each Topical Q0600   [START ON 08/24/2020] darbepoetin (ARANESP) injection - DIALYSIS  60 mcg Intravenous Q Mon-HD   feeding supplement (NEPRO CARB STEADY)  1,000 mL Per Tube Q24H   feeding supplement (PROSource TF)  45 mL Per Tube QID   finasteride  5 mg Oral Daily   free water  50 mL Per Tube 6 X Daily   insulin aspart  0-20 Units Subcutaneous TID WC   insulin aspart  0-5 Units Subcutaneous QHS   Ipratropium-Albuterol  1 puff Inhalation Q6H   lidocaine  1 patch Transdermal Q24H   magic mouthwash  10 mL Oral TID   mouth rinse  15 mL Mouth Rinse q12n4p   metoCLOPramide (REGLAN) injection  5 mg Intravenous Q8H   tamsulosin  0.4 mg Oral QHS   Continuous Infusions:   prismasol BGK 4/2.5 400 mL/hr at 08/22/20 0850    prismasol BGK 4/2.5 200 mL/hr at 08/22/20 0855   sodium chloride 5 mL/hr at 08/22/20 1000   heparin 10,000  units/ 20 mL infusion syringe 300 Units/hr (08/22/20 0925)   prismasol BGK 4/2.5 1,800 mL/hr at 08/22/20 0854   promethazine (PHENERGAN) injection (IM or IVPB) Stopped (08/21/20 0559)     LOS: 11 days   Time spent= 35 mins    Billijo Dilling Arsenio Loader, MD [facility]  If 7PM-7AM, please contact night-coverage  08/22/2020, 10:41 AM

## 2020-08-22 NOTE — Progress Notes (Signed)
Nutrition Follow-up  DOCUMENTATION CODES:   Not applicable  INTERVENTION:   -D/c Nepro  Initiate Vital 1.5 @ 50 ml/hr via OGT  90 ml Prosource TF BID  50 ml free water flush 6 times daily per MD  Tube feeding regimen provides 1960 kcal (98% of needs), 125 grams of protein, and 917 ml of H2O.  Total free water: 1217 ml daily  NUTRITION DIAGNOSIS:   Increased nutrient needs related to acute illness (COVID-19) as evidenced by estimated needs.  Ongoing  GOAL:   Patient will meet greater than or equal to 90% of their needs  Progressing   MONITOR:   TF tolerance, PO intake, Supplement acceptance, Labs, Weight trends  REASON FOR ASSESSMENT:   Consult Enteral/tube feeding initiation and management  ASSESSMENT:   Pt with a PMH significant of multiple myeloma (currently undergoing chemo), DM, HTN, HLD, asthma, and OA admitted with hypokalemia, AKI as well as COVID-19 infection  6/29- NGT placed (proximal duodenum)  Reviewed I/O's: -1.5 L x 24 hours and +6.2 L since admission  UOP: 1.1 L x 24 hours  Per MD notes, pt continues on CRRT. Pt awaiting transfer to [facility], where he will be transitioned to iHD.   Pt on a regular diet, but remains with very poor oral intake. Noted documented meal completion 0-10%.   Case discussed with pharmacist, who is seeking clarification on TF order and goal rate. Per previous RD note on 08/21/20, MD is requesting that TF rate not exceed 50 ml/hr.   Medications reviewed and include calcium carbonate, aranesp, and reglan.  Labs reviewed: CBGS: 138-180 (inpatient orders for glycemic control are 0-20 units insulin aspart TID with meals and 0-5 units insulin aspart daily at bedtime).    Diet Order:   Diet Order             Diet regular Room service appropriate? Yes; Fluid consistency: Thin  Diet effective now                   EDUCATION NEEDS:   No education needs have been identified at this time  Skin:  Skin Assessment: Skin  Integrity Issues: Skin Integrity Issues:: Stage I Stage I: rt/ lt coccyx  Last BM:  08/21/20 (via rectal tube)  Height:   Ht Readings from Last 1 Encounters:  08/11/20 $RemoveB'5\' 8"'uKFKzkBU$  (1.727 m)    Weight:   Wt Readings from Last 1 Encounters:  08/22/20 66.2 kg    Ideal Body Weight:  70 kg  BMI:  Body mass index is 22.19 kg/m.  Estimated Nutritional Needs:   Kcal:  2000-2200  Protein:  120-135 grams  Fluid:  > 2 L    Loistine Chance, RD, LDN, [place] Registered Dietitian II Certified Diabetes Care and Education Specialist Please refer to [facility] for RD and/or RD on-call/weekend/after hours pager

## 2020-08-22 NOTE — Progress Notes (Signed)
Pt transferred via [facility] to [facility].  VSS at time of transfer.  Son, [name] called and notified of transfer.

## 2020-08-22 NOTE — Progress Notes (Signed)
Subjective:    Looks more lethargic today per Therapist, sports.   Patient not seen directly today given COVID-19 + status, utilizing data taken from chart +/- discussions w/ providers and staff.     Objective Vital signs in last 24 hours: Vitals:   08/22/20 0841 08/22/20 0851 08/22/20 0900 08/22/20 1109  BP:  (!) 121/51 124/64 (!) 125/52  Pulse:  80 78 78  Resp:  $Remo'19 20 19  'rnfMb$ Temp: 98.1 F (36.7 C) 98 F (36.7 C)  98.1 F (36.7 C)  TempSrc: Oral Axillary  Axillary  SpO2:  96% 97% 97%  Weight:      Height:       Weight change: -4 kg  Intake/Output Summary (Last 24 hours) at 08/22/2020 1212 Last data filed at 08/22/2020 1110 Gross per 24 hour  Intake 1775.9 ml  Output 2348.2 ml  Net -572.3 ml     Assessment/ Plan: Pt is a 75 y.o. yo male with multiple myeloma who was admitted on 08/11/2020 with COVID-  A on CRT-  crt 1.7 in Jan 2022-  now 7- non oliguric   Assessment/Plan: 1. A on CRF-  baseline creat 1.7 in Jan 2022. Renal u/s neg for obstruction. UA unrevealing. UOP good.  Suspect ATN from hypotension/ ACE.  Myeloma kidney unlikely- per ONC w/u current myeloma testing is negative. Initially pt was refusing dialysis, but then changed his mind. Due to decline in responsiveness and persistent azotemia (creat in 7's), pt was started on CRRT 6/29.  Pt seems to be looking better yesterday but today is again very lethargic, which suggests that uremia is not the biggest problem here causing his decline. COVID related vs multifactorial. Regardless w/ creat 2's he is well dialyzed and should have no uremic symptoms. Will cont CRRT for now. If tx'd to [facility] will switch to iHD.  Poor prognosis overall.  2. HTN/volume-  wt's down, only 2-3kg up from admit wt. No vol ^ on exam yest. Will dc UF and keep +50 cc/hr (to make up for insensible losses). BP's stable.  3. Anemia-  blood transfusion 6/23, Hb in 7-8 range, ordered darbe 60 ug weekly IV to start today 4. Hypocalcemia-  corrected is around 7.7-  no sxms -  no action needed -  got bolus during hosp 5. Hypokalemia/ hypophos- getting IV replacements today   Kelly Splinter, MD 08/22/2020, 12:12 PM       Labs: Basic Metabolic Panel: Recent Labs  Lab 08/21/20 0530 08/21/20 1555 08/22/20 0533  NA 135 135 136  K 3.4* 4.0 3.5  CL 101 104 102  CO2 $Re'30 26 27  'sTy$ GLUCOSE 182* 138* 154*  BUN 33* 25* 35*  CREATININE 2.15* 1.60* 2.28*  CALCIUM 7.3* 7.1* 7.1*  PHOS 1.9* 2.1* 2.7    Liver Function Tests: Recent Labs  Lab 08/20/20 0500 08/20/20 1857 08/21/20 0530 08/21/20 1555 08/22/20 0533  AST 18  --  18  --  13*  ALT 13  --  12  --  11  ALKPHOS 35*  --  39  --  37*  BILITOT 0.8  --  0.6  --  0.7  PROT 5.3*  --  5.8*  --  5.5*  ALBUMIN 1.9*  1.9*   < > 1.9*  1.9* 2.0* 2.0*  1.9*   < > = values in this interval not displayed.    No results for input(s): LIPASE, AMYLASE in the last 168 hours. No results for input(s): AMMONIA in the last 168 hours. CBC: Recent  Labs  Lab 08/19/20 0346 08/20/20 0000 08/20/20 0500 08/21/20 0530 08/22/20 0533  WBC 1.7* 2.3* 2.6* 4.6 5.7  NEUTROABS 0.8* 1.2* 1.5* 3.5  --   HGB 7.5* 7.9* 8.1* 7.9* 7.0*  HCT 22.4* 23.2* 23.5* 23.9* 21.7*  MCV 93.3 91.0 91.4 94.1 96.9  PLT 19* 35* 33* 24* 22*    Cardiac Enzymes: No results for input(s): CKTOTAL, CKMB, CKMBINDEX, TROPONINI in the last 168 hours. CBG: Recent Labs  Lab 08/21/20 1521 08/21/20 1930 08/21/20 2327 08/22/20 0801 08/22/20 1206  GLUCAP 137* 138* 135* 180* 138*     Iron Studies:  No results for input(s): IRON, TIBC, TRANSFERRIN, FERRITIN in the last 72 hours.  Studies/Results: DG Abd 1 View  Result Date: 08/21/2020 CLINICAL DATA:  Check feeding catheter placement and abdominal distension EXAM: ABDOMEN - 1 VIEW COMPARISON:  08/19/2020 FINDINGS: Feeding catheter is noted extending towards the proximal portion of the duodenum. Gaseous distension of the stomach is noted. Scattered large and small bowel gas is noted. Changes of prior  vertebral augmentation are again noted. No free air is seen. IMPRESSION: Overall stable appearance of the abdomen. Feeding catheter is noted extending into the proximal duodenum. Electronically Signed   By: Inez Catalina M.D.   On: 08/21/2020 11:18   DG CHEST PORT 1 VIEW  Result Date: 08/21/2020 CLINICAL DATA:  COVID-19 positivity with increased cough, initial encounter EXAM: PORTABLE CHEST 1 VIEW COMPARISON:  08/19/2020 FINDINGS: Feeding catheter and right jugular central line are again seen and stable. Postsurgical changes in the cervical spine are noted. Cardiac shadow is stable. Small basilar effusions are noted. No focal infiltrate or pneumothorax is noted. IMPRESSION: Small bilateral pleural effusions. Tubes and lines as described above stable in appearance. Electronically Signed   By: Inez Catalina M.D.   On: 08/21/2020 08:22   Medications: Infusions:   prismasol BGK 4/2.5 400 mL/hr at 08/22/20 0850    prismasol BGK 4/2.5 200 mL/hr at 08/22/20 0855   sodium chloride 5 mL/hr at 08/22/20 1100   heparin 10,000 units/ 20 mL infusion syringe 300 Units/hr (08/22/20 0925)   prismasol BGK 4/2.5 1,800 mL/hr at 08/22/20 0854   promethazine (PHENERGAN) injection (IM or IVPB) Stopped (08/21/20 0559)    Scheduled Medications:  calcium carbonate  1,000 mg of elemental calcium Per Tube BID WC   chlorhexidine  15 mL Mouth Rinse BID   Chlorhexidine Gluconate Cloth  6 each Topical Q0600   [START ON 08/24/2020] darbepoetin (ARANESP) injection - DIALYSIS  60 mcg Intravenous Q Mon-HD   feeding supplement (NEPRO CARB STEADY)  1,000 mL Per Tube Q24H   feeding supplement (PROSource TF)  45 mL Per Tube QID   finasteride  5 mg Oral Daily   free water  50 mL Per Tube 6 X Daily   insulin aspart  0-20 Units Subcutaneous TID WC   insulin aspart  0-5 Units Subcutaneous QHS   Ipratropium-Albuterol  1 puff Inhalation Q6H   lidocaine  1 patch Transdermal Q24H   magic mouthwash  10 mL Oral TID   mouth rinse  15 mL Mouth  Rinse q12n4p   metoCLOPramide (REGLAN) injection  5 mg Intravenous Q8H   tamsulosin  0.4 mg Oral QHS    have reviewed scheduled and prn medications.  Physical Exam: Gen alert, no distress. Very lethargic, answers simple questions No rash, cyanosis or gangrene Sclera anicteric, throat clear  No jvd or bruits Chest clear bilat to bases, no rales/ wheezing RRR no MRG Abd soft ntnd no  mass or ascites +bs GU normal MS no joint effusions or deformity Ext 1+ edema LE"s. no wounds or ulcers Neuro is lethargic, very weak

## 2020-08-22 NOTE — Progress Notes (Signed)
Son upset that we only allow one visitor due to patient being Covid positive. Per son we are being cruel to the patient. Son is demanding for patient to be retested for Covid and we need to allow more visitors for his father. Explained the visitor policy and the patient's son said he will speak to the doctors tomorrow and if the doctors don't retest him he will use a home test to test his father.

## 2020-08-23 DIAGNOSIS — U071 COVID-19: Secondary | ICD-10-CM | POA: Diagnosis not present

## 2020-08-23 DIAGNOSIS — I10 Essential (primary) hypertension: Secondary | ICD-10-CM | POA: Diagnosis not present

## 2020-08-23 DIAGNOSIS — E118 Type 2 diabetes mellitus with unspecified complications: Secondary | ICD-10-CM | POA: Diagnosis not present

## 2020-08-23 DIAGNOSIS — L8990 Pressure ulcer of unspecified site, unspecified stage: Secondary | ICD-10-CM | POA: Insufficient documentation

## 2020-08-23 DIAGNOSIS — N179 Acute kidney failure, unspecified: Secondary | ICD-10-CM | POA: Diagnosis not present

## 2020-08-23 LAB — CBC
HCT: 22.3 % — ABNORMAL LOW (ref 39.0–52.0)
Hemoglobin: 7.6 g/dL — ABNORMAL LOW (ref 13.0–17.0)
MCH: 32.9 pg (ref 26.0–34.0)
MCHC: 34.1 g/dL (ref 30.0–36.0)
MCV: 96.5 fL (ref 80.0–100.0)
Platelets: 19 10*3/uL — CL (ref 150–400)
RBC: 2.31 MIL/uL — ABNORMAL LOW (ref 4.22–5.81)
RDW: 16.4 % — ABNORMAL HIGH (ref 11.5–15.5)
WBC: 5.3 10*3/uL (ref 4.0–10.5)
nRBC: 0 % (ref 0.0–0.2)

## 2020-08-23 LAB — BPAM RBC
Blood Product Expiration Date: 202208012359
Blood Product Expiration Date: 202208032359
ISSUE DATE / TIME: 202206291405
ISSUE DATE / TIME: 202207020819
Unit Type and Rh: 9500
Unit Type and Rh: 9500

## 2020-08-23 LAB — RENAL FUNCTION PANEL
Albumin: 1.6 g/dL — ABNORMAL LOW (ref 3.5–5.0)
Albumin: 1.7 g/dL — ABNORMAL LOW (ref 3.5–5.0)
Anion gap: 6 (ref 5–15)
Anion gap: 7 (ref 5–15)
BUN: 42 mg/dL — ABNORMAL HIGH (ref 8–23)
BUN: 52 mg/dL — ABNORMAL HIGH (ref 8–23)
CO2: 24 mmol/L (ref 22–32)
CO2: 23 mmol/L (ref 22–32)
Calcium: 6.8 mg/dL — ABNORMAL LOW (ref 8.9–10.3)
Calcium: 6.8 mg/dL — ABNORMAL LOW (ref 8.9–10.3)
Chloride: 103 mmol/L (ref 98–111)
Chloride: 101 mmol/L (ref 98–111)
Creatinine, Ser: 2.6 mg/dL — ABNORMAL HIGH (ref 0.61–1.24)
Creatinine, Ser: 2.91 mg/dL — ABNORMAL HIGH (ref 0.61–1.24)
GFR, Estimated: 25 mL/min — ABNORMAL LOW (ref >60)
GFR, Estimated: 22 mL/min — ABNORMAL LOW (ref >60)
Glucose, Bld: 151 mg/dL — ABNORMAL HIGH (ref 70–99)
Glucose, Bld: 165 mg/dL — ABNORMAL HIGH (ref 70–99)
Phosphorus: 2.3 mg/dL — ABNORMAL LOW (ref 2.5–4.6)
Phosphorus: 2.1 mg/dL — ABNORMAL LOW (ref 2.5–4.6)
Potassium: 3.5 mmol/L (ref 3.5–5.1)
Potassium: 3.5 mmol/L (ref 3.5–5.1)
Sodium: 133 mmol/L — ABNORMAL LOW (ref 135–145)
Sodium: 131 mmol/L — ABNORMAL LOW (ref 135–145)

## 2020-08-23 LAB — FOLATE RBC
Folate, Hemolysate: 265 ng/mL
Folate, RBC: 1077 ng/mL (ref >498)
Hematocrit: 24.6 % — ABNORMAL LOW (ref 37.5–51.0)

## 2020-08-23 LAB — TYPE AND SCREEN
ABO/RH(D): O NEG
Antibody Screen: NEGATIVE
Unit division: 0
Unit division: 0

## 2020-08-23 LAB — MAGNESIUM: Magnesium: 2 mg/dL (ref 1.7–2.4)

## 2020-08-23 LAB — APTT: aPTT: 36 seconds (ref 24–36)

## 2020-08-23 LAB — GLUCOSE, CAPILLARY
Glucose-Capillary: 145 mg/dL — ABNORMAL HIGH (ref 70–99)
Glucose-Capillary: 148 mg/dL — ABNORMAL HIGH (ref 70–99)
Glucose-Capillary: 152 mg/dL — ABNORMAL HIGH (ref 70–99)
Glucose-Capillary: 161 mg/dL — ABNORMAL HIGH (ref 70–99)
Glucose-Capillary: 182 mg/dL — ABNORMAL HIGH (ref 70–99)
Glucose-Capillary: 206 mg/dL — ABNORMAL HIGH (ref 70–99)

## 2020-08-23 NOTE — Progress Notes (Addendum)
PROGRESS NOTE    Jeff Rodriguez  ION:[id number] DOB: Oct 27, 1945 DOA: 08/11/2020 PCP: [facility]    Chief Complaint  Patient presents with   Cough    Brief Narrative:  75 y.o. male with medical history significant of multiple myeloma (on [name]), diabetes, hypertension, asthma, hyperlipidemia and osteoarthritis who was brought in by EMS secondary to productive cough, poor oral intake and not eating much.  Patient is currently undergoing chemo for his multiple myeloma. Tested positive for COVID 19, found to have hypokalemic and acute renal failure.  Nephrology team following.  Due to need repeat thrombocytopenia, oncology consulted.  Neupogen given.  Bone scan 08/18/2020 negative for lytic lesions.  Due to poor prognosis, palliative care team consulted and patient decided for CRRT.  Per nephrology patient to be transferred to [facility] for HD.  He has received 2 units of prbc transfusion so far.  Pt seen and examined at bedside, no new complaints.    Assessment & Plan:   Principal Problem:   Acute renal failure superimposed on stage 3b chronic kidney disease (HCC) Active Problems:   Hyperlipidemia   Diabetes mellitus with complication (HCC)   Essential hypertension   COVID-19 virus infection   Hypokalemia   Pancytopenia (HCC)   Multiple myeloma (HCC)   Failure to thrive in adult   Pressure injury of skin  Acute on stage IIIb CKD Patient is currently scheduled for hemodialysis. Baseline creatinine around 1.7-2 Patient was admitted with a creatinine of 7. AKI probably secondary to dehydration versus medications Ultrasound of the kidneys did not show any hydronephrosis or obstructive pathology Nephrology on board and he was started on HD Creatinine improving.   Acute metabolic encephalopathy Improving patient is alert answering questions appropriately. Initial CT of the head is negative for acute intracranial abnormalities. TSH, vitamin B12 normal, ammonia  level within normal limits   Type 2 diabetes mellitus Insulin dependent at home.  On SSI here.  CBG (last 3)  Recent Labs    08/23/20 0355 08/23/20 0805 08/23/20 1210  GLUCAP 145* 182* 206*   Hemoglobin A1c is around 8.4. Restart lantus at 10 units daily tmorrow if cbgs remain elevated.    COVID 19 virus infection.  Completed the course of remdesivir, continue with flutter valve and incentive spirometer and inhalers as needed. Therapy evaluations pending.    Hypokalemia replaced.    Hypertension:  Well controlled.   Hyperlipidemia   Multiple myeloma Dr. Ennever/oncology on board Bone scan does not show any lytic lesions at this time.   Anemia of chronic disease Transfuse to keep hemoglobin greater than 7   Severe thrombocytopenia Platelets around 19,000, no evidence of bleeding at this time. Discussed with Dr. Marin Olp recommended no platelet transfusion at this time as he is not bleeding.   Pressure injury on the back.  Pressure Injury 08/21/20 Coccyx Right;Left;Mid Stage 2 -  Partial thickness loss of dermis presenting as a shallow open injury with a red, pink wound bed without slough. (Active)  08/21/20 2000  Location: Coccyx  Location Orientation: Right;Left;Mid  Staging: Stage 2 -  Partial thickness loss of dermis presenting as a shallow open injury with a red, pink wound bed without slough.  Wound Description (Comments):   Present on Admission:    Wound care  will be consulted.    .  Failure to thrive Dietary will be consulted.   In view of multiple comorbidities, clinical deterioration, hemodialysis dependent, AKI, poor progression palliative care consulted for goals of care  discussion.    DVT prophylaxis: heparin.  Code Status: Full code.  Family Communication: None at bedside.  Disposition:   Status is: Inpatient  Remains inpatient appropriate because:Ongoing diagnostic testing needed not appropriate for outpatient work up and Unsafe  d/c plan  Dispo: The patient is from: Home              Anticipated d/c is to:  pending              Patient currently is not medically stable to d/c.   Difficult to place patient No       Consultants:  None.   Procedures: none.   Antimicrobials:  Antibiotics Given (last 72 hours)     None         Subjective: Pt denies any chest pain or sob . He is on RA. Not in distress.   Objective: Vitals:   08/23/20 0000 08/23/20 0400 08/23/20 0804 08/23/20 1211  BP: (!) 120/59 (!) 115/54 (!) 124/54 (!) 122/54  Pulse: 87 89 86 85  Resp: $Remo'19 20 20 18  'QIiYO$ Temp:  98.8 F (37.1 C) 98.4 F (36.9 C) 98.2 F (36.8 C)  TempSrc:  Oral Oral Oral  SpO2: 96% 95% 96% 96%  Weight:  72.4 kg    Height:        Intake/Output Summary (Last 24 hours) at 08/23/2020 1524 Last data filed at 08/23/2020 0419 Gross per 24 hour  Intake 26.2 ml  Output 985.5 ml  Net -959.3 ml   Filed Weights   08/21/20 0500 08/22/20 0500 08/23/20 0400  Weight: 70.2 kg 66.2 kg 72.4 kg    Examination:  General exam: Appears calm and comfortable  Respiratory system: Clear to auscultation. Respiratory effort normal. Cardiovascular system: S1 & S2 heard, RRR. No JVD, . No pedal edema. Gastrointestinal system: Abdomen is nondistended, soft and nontender.  Normal bowel sounds heard. Central nervous system: Alert and answering questions appropriately.  Extremities: no pedal edema.  Skin: pressure injury on the  back .  Psychiatry: Mood & affect appropriate.     Data Reviewed: I have personally reviewed following labs and imaging studies  CBC: Recent Labs  Lab 08/18/20 0316 08/19/20 0346 08/20/20 0000 08/20/20 0500 08/21/20 0530 08/21/20 0731 08/22/20 0533 08/22/20 1603 08/23/20 0550  WBC 1.3* 1.7* 2.3* 2.6* 4.6  --  5.7  --  5.3  NEUTROABS 0.4* 0.8* 1.2* 1.5* 3.5  --   --   --   --   HGB 8.4* 7.5* 7.9* 8.1* 7.9*  --  7.0* 7.7* 7.6*  HCT 25.3* 22.4* 23.2* 23.5* 23.9* 24.6* 21.7* 23.5* 22.3*  MCV 94.4  93.3 91.0 91.4 94.1  --  96.9  --  96.5  PLT 23* 19* 35* 33* 24*  --  22*  --  19*    Basic Metabolic Panel: Recent Labs  Lab 08/19/20 0346 08/19/20 1629 08/20/20 0500 08/20/20 1857 08/21/20 0530 08/21/20 1555 08/22/20 0533 08/23/20 0550  NA 131*   < > 133* 134* 135 135 136 133*  K 3.2*   < > 3.1* 3.3* 3.4* 4.0 3.5 3.5  CL 97*   < > 101 100 101 104 102 103  CO2 23   < > $R'25 27 30 26 27 24  'HN$ GLUCOSE 146*   < > 167* 184* 182* 138* 154* 151*  BUN 95*   < > 69* 45* 33* 25* 35* 42*  CREATININE 7.13*   < > 4.74* 2.99* 2.15* 1.60* 2.28* 2.60*  CALCIUM 6.6*   < >  6.8* 6.8* 7.3* 7.1* 7.1* 6.8*  MG 2.1  --  2.1  --  2.2  --  2.4 2.0  PHOS 3.9   < > 3.7 3.0 1.9* 2.1* 2.7 2.3*   < > = values in this interval not displayed.    GFR: Estimated Creatinine Clearance: 24.1 mL/min (A) (by C-G formula based on SCr of 2.6 mg/dL (H)).  Liver Function Tests: Recent Labs  Lab 08/18/20 0316 08/19/20 0346 08/19/20 1629 08/20/20 0500 08/20/20 1857 08/21/20 0530 08/21/20 1555 08/22/20 0533 08/23/20 0550  AST 15 16  --  18  --  18  --  13*  --   ALT 14 14  --  13  --  12  --  11  --   ALKPHOS 37* 38  --  35*  --  39  --  37*  --   BILITOT 0.5 0.6  --  0.8  --  0.6  --  0.7  --   PROT 4.9* 5.1*  --  5.3*  --  5.8*  --  5.5*  --   ALBUMIN 1.9* 1.8*   < > 1.9*  1.9* 1.9* 1.9*  1.9* 2.0* 2.0*  1.9* 1.6*   < > = values in this interval not displayed.    CBG: Recent Labs  Lab 08/22/20 1938 08/22/20 2303 08/23/20 0355 08/23/20 0805 08/23/20 1210  GLUCAP 113* 180* 145* 182* 206*     Recent Results (from the past 240 hour(s))  Culture, Urine     Status: Abnormal   Collection Time: 08/14/20  3:11 PM   Specimen: Urine, Random  Result Value Ref Range Status   Specimen Description   Final    URINE, RANDOM Performed at [facility], [place] [address] [place], [place] [address]    Special Requests   Final    NONE Performed at [facility], [place]  [address], [place], [place] [address]    Culture MULTIPLE SPECIES PRESENT, SUGGEST RECOLLECTION (A)  Final   Report Status 08/15/2020 FINAL  Final  MRSA PCR Screening     Status: None   Collection Time: 08/19/20  5:53 PM  Result Value Ref Range Status   MRSA by PCR NEGATIVE NEGATIVE Final    Comment:        The GeneXpert MRSA Assay (FDA approved for NASAL specimens only), is one component of a comprehensive MRSA colonization surveillance program. It is not intended to diagnose MRSA infection nor to guide or monitor treatment for MRSA infections. Performed at [facility], [place] [address], [place], [place] [address]          Radiology Studies: No results found.      Scheduled Meds:  calcium carbonate  1,000 mg of elemental calcium Per Tube BID WC   chlorhexidine  15 mL Mouth Rinse BID   Chlorhexidine Gluconate Cloth  6 each Topical Q0600   [START ON 08/24/2020] darbepoetin (ARANESP) injection - DIALYSIS  60 mcg Intravenous Q Mon-HD   feeding supplement (PROSource TF)  90 mL Per Tube BID   finasteride  5 mg Oral Daily   free water  50 mL Per Tube 6 X Daily   insulin aspart  0-15 Units Subcutaneous Q4H   Ipratropium-Albuterol  1 puff Inhalation Q6H   lidocaine  1 patch Transdermal Q24H   magic mouthwash  10 mL Oral TID   mouth rinse  15 mL Mouth Rinse q12n4p   metoCLOPramide (REGLAN) injection  5 mg Intravenous  Q8H   tamsulosin  0.4 mg Oral QHS   Continuous Infusions:   prismasol BGK 4/2.5 400 mL/hr at 08/22/20 0850   sodium chloride Stopped (08/22/20 1544)   feeding supplement (VITAL 1.5 CAL) 1,000 mL (08/22/20 1845)   promethazine (PHENERGAN) injection (IM or IVPB) Stopped (08/21/20 0559)     LOS: 12 days       Hosie Poisson, MD [facility]   To contact the attending provider between 7A-7P or the covering provider during after hours 7P-7A, please log into the web site [email or web address] and access using universal [place] password for  that web site. If you do not have the password, please call the hospital operator.  08/23/2020, 3:24 PM

## 2020-08-23 NOTE — Progress Notes (Signed)
Patient's platelet 19. MD is aware.

## 2020-08-23 NOTE — Progress Notes (Signed)
Physical Therapy Treatment Patient Details Name: Jeff Rodriguez MRN: [id number] DOB: Feb 05, 1946 Today's Date: 08/23/2020    History of Present Illness patient is a 75 year old male who was admitted to the hospital on 6/22 with COVID 19 and AKI; at [facility], had initiated Renal Replacement Therapy, transferred to [facility];  past medical history significant for multiple myeloma, diabetes mellitus, hypertension, asthma, hyperlipidemia, osteoarthritis.    PT Comments    Continuing work on functional mobility and activity tolerance;  Pt hadn't been upright or OOB since transfer to [facility], so session re-focused on mobility; Sat Max assist to get to sitting, sat EOB approx 5-8 minutes with minguard assist; dizzy initially, BP checked and was normal; stood from EOB with considerable assist for approx 20-30 seconds; pt requested to get back to bed;   We discussed plans for OOB to recliner transfers next session   Follow Up Recommendations  SNF     Equipment Recommendations  Rolling walker with 5" wheels    Recommendations for Other Services       Precautions / Restrictions Precautions Precautions: Fall Precaution Comments: cortrak    Mobility  Bed Mobility Overal bed mobility: Needs Assistance Bed Mobility: Supine to Sit;Sit to Supine     Supine to sit: Mod assist;HOB elevated Sit to supine: Max assist   General bed mobility comments: Heavy mod assist to help LEs off of bed and elevate trunk to sit    Transfers Overall transfer level: Needs assistance Equipment used: Rolling walker (2 wheeled) Transfers: Sit to/from Stand Sit to Stand: Mod assist         General transfer comment: cues for hand placement, to power up with LEs, Heavy mod (almost Max) assist assist to rise extend hips/trunk  Ambulation/Gait Ambulation/Gait assistance: Mod assist Gait Distance (Feet): 2 Feet Assistive device: Rolling walker (2 wheeled)       General Gait Details: lateral steps along  EOB   Stairs             Wheelchair Mobility    Modified Rankin (Stroke Patients Only)       Balance     Sitting balance-Leahy Scale: Fair       Standing balance-Leahy Scale: Poor Standing balance comment: reliant on UEs and external support                            Cognition Arousal/Alertness: Lethargic (but arousable, and stayed more awake sitting EOB) Behavior During Therapy: WFL for tasks assessed/performed Overall Cognitive Status: Within Functional Limits for tasks assessed                                 General Comments: Initially declining PT, but with encouragement, agreed to participate      Exercises      General Comments General comments (skin integrity, edema, etc.): session conducted on Room air with stable O2 sats, 90% or greater; inital dizziness sitting upright, BP stable      Pertinent Vitals/Pain Pain Assessment: Faces Faces Pain Scale: Hurts little more Pain Location: back pain Pain Descriptors / Indicators: Discomfort;Sore;Grimacing Pain Intervention(s): Monitored during session    Home Living                      Prior Function            PT Goals (current goals can now be found  in the care plan section) Acute Rehab PT Goals Patient Stated Goal: Did not state, but participated in simple mobility tasks PT Goal Formulation: With patient Time For Goal Achievement: 08/28/20 Potential to Achieve Goals: Good Progress towards PT goals: Progressing toward goals (slowly)    Frequency    Min 2X/week      PT Plan Current plan remains appropriate    Co-evaluation              AM-PAC PT "6 Clicks" Mobility   Outcome Measure  Help needed turning from your back to your side while in a flat bed without using bedrails?: A Lot Help needed moving from lying on your back to sitting on the side of a flat bed without using bedrails?: A Lot Help needed moving to and from a bed to a chair  (including a wheelchair)?: A Lot Help needed standing up from a chair using your arms (e.g., wheelchair or bedside chair)?: A Lot Help needed to walk in hospital room?: A Lot Help needed climbing 3-5 steps with a railing? : Total 6 Click Score: 11    End of Session Equipment Utilized During Treatment: Gait belt Activity Tolerance: Patient limited by fatigue Patient left: in bed;with call bell/phone within reach;with family/visitor present;with nursing/sitter in room Nurse Communication: Mobility status PT Visit Diagnosis: Other abnormalities of gait and mobility (R26.89);Muscle weakness (generalized) (M62.81)     Time: 2820-6015 PT Time Calculation (min) (ACUTE ONLY): 35 min  Charges:  $Therapeutic Activity: 23-37 mins                     Roney Marion, PT  Acute Rehabilitation Services Pager [phone] Office [place] 08/23/2020, 7:52 PM

## 2020-08-23 NOTE — Progress Notes (Signed)
Subjective:  remains lethargic but easy to arouse and answers most questions    Objective Vital signs in last 24 hours: Vitals:   08/23/20 0400 08/23/20 0804 08/23/20 1211 08/23/20 1622  BP: (!) 115/54 (!) 124/54 (!) 122/54 124/65  Pulse: 89 86 85 90  Resp: _0 Temp: 98.8 F (37.1 C) 98.4 F (36.9 C) 98.2 F (36.8 C) 98.4 F (36.9 C)  TempSrc: Oral Oral Oral Oral  SpO2: 95% 96% 96% 96%  Weight: 72.4 kg     Height:       Weight change: 6.2 kg  Intake/Output Summary (Last 24 hours) at 08/23/2020 1804 Last data filed at 08/23/2020 0840 Gross per 24 hour  Intake --  Output 1050 ml  Net -1050 ml     Assessment/ Plan: Pt is a 75 y.o. yo male with multiple myeloma who was admitted on 08/11/2020 with COVID-  A on CRT-  crt 1.7 in Jan 2022-  now 7- non oliguric   Assessment/Plan: 1. A on CRF-  baseline creat 1.7 in Jan 2022. Renal u/s neg for obstruction. UA unrevealing. UOP good.  Suspect ATN from hypotension/ ACE.  Myeloma kidney unlikely- per ONC w/u current myeloma testing is negative. Initially pt was refusing dialysis, but then changed his mind. Due to decline in responsiveness and persistent azotemia (creat in 7's), pt was started on CRRT 6/29. Pt became more alert and responsive w/ CRRT , however he has remained very unmotivated. CRRT dc'd on 7/02 when pt was transferred to [facility].  Labs are good today, no need for RRT today or tomorrow. Watch for signs of recovery, UOP remains good. 2. HTN/volume-  no vol excess on exam today 3. Anemia-  blood transfusion 6/23, Hb in 7-8 range, ordered darbe 60 ug weekly IV to start monday 4. Hypocalcemia-  corrected is around 7.7-  no sxms - no action needed -  got bolus during hosp 5. Hx multiple myeloma - no evidence here of active disease, see ONC notes 6. Pancytopenia - per Hem-Onc 7.  COVID 19 infection - sp course of remdesivir   Kelly Splinter, MD 08/23/2020, 6:04 PM       Labs: Basic Metabolic Panel: Recent Labs  Lab  08/22/20 0533 08/23/20 0550 08/23/20 1612  NA 136 133* 131*  K 3.5 3.5 3.5  CL 102 103 101  CO2 _1 GLUCOSE 154* 151* 165*  BUN 35* 42* 52*  CREATININE 2.28* 2.60* 2.91*  CALCIUM 7.1* 6.8* 6.8*  PHOS 2.7 2.3* 2.1*    Liver Function Tests: Recent Labs  Lab 08/20/20 0500 08/20/20 1857 08/21/20 0530 08/21/20 1555 08/22/20 0533 08/23/20 0550 08/23/20 1612  AST 18  --  18  --  13*  --   --   ALT 13  --  12  --  11  --   --   ALKPHOS 35*  --  39  --  37*  --   --   BILITOT 0.8  --  0.6  --  0.7  --   --   PROT 5.3*  --  5.8*  --  5.5*  --   --   ALBUMIN 1.9*  1.9*   < > 1.9*  1.9*   < > 2.0*  1.9* 1.6* 1.7*   < > = values in this interval not displayed.    No results for input(s): LIPASE, AMYLASE in the last 168 hours. Recent Labs  Lab 08/22/20 1206  AMMONIA 13  CBC: Recent Labs  Lab 08/20/20 0000 08/20/20 0500 08/21/20 0530 08/21/20 0731 08/22/20 0533 08/22/20 1603 08/23/20 0550  WBC 2.3* 2.6* 4.6  --  5.7  --  5.3  NEUTROABS 1.2* 1.5* 3.5  --   --   --   --   HGB 7.9* 8.1* 7.9*  --  7.0* 7.7* 7.6*  HCT 23.2* 23.5* 23.9*   < > 21.7* 23.5* 22.3*  MCV 91.0 91.4 94.1  --  96.9  --  96.5  PLT 35* 33* 24*  --  22*  --  19*   < > = values in this interval not displayed.    Cardiac Enzymes: No results for input(s): CKTOTAL, CKMB, CKMBINDEX, TROPONINI in the last 168 hours. CBG: Recent Labs  Lab 08/22/20 2303 08/23/20 0355 08/23/20 0805 08/23/20 1210 08/23/20 1621  GLUCAP 180* 145* 182* 206* 161*     Iron Studies:  No results for input(s): IRON, TIBC, TRANSFERRIN, FERRITIN in the last 72 hours.  Studies/Results: No results found. Medications: Infusions:   prismasol BGK 4/2.5 400 mL/hr at 08/22/20 0850   sodium chloride Stopped (08/22/20 1544)   feeding supplement (VITAL 1.5 CAL) 1,000 mL (08/23/20 1709)   promethazine (PHENERGAN) injection (IM or IVPB) Stopped (08/21/20 0559)    Scheduled Medications:  calcium carbonate  1,000 mg of  elemental calcium Per Tube BID WC   chlorhexidine  15 mL Mouth Rinse BID   Chlorhexidine Gluconate Cloth  6 each Topical Q0600   [START ON 08/24/2020] darbepoetin (ARANESP) injection - DIALYSIS  60 mcg Intravenous Q Mon-HD   feeding supplement (PROSource TF)  90 mL Per Tube BID   finasteride  5 mg Oral Daily   free water  50 mL Per Tube 6 X Daily   insulin aspart  0-15 Units Subcutaneous Q4H   Ipratropium-Albuterol  1 puff Inhalation Q6H   lidocaine  1 patch Transdermal Q24H   magic mouthwash  10 mL Oral TID   mouth rinse  15 mL Mouth Rinse q12n4p   metoCLOPramide (REGLAN) injection  5 mg Intravenous Q8H   tamsulosin  0.4 mg Oral QHS    have reviewed scheduled and prn medications.  Physical Exam: Gen alert, no distress. lethargic, but Ox 3 No rash, cyanosis or gangrene Sclera anicteric, throat clear  No jvd or bruits Chest clear bilat to bases, no rales/ wheezing RRR no MRG Abd soft ntnd no mass or ascites +bs GU normal MS no joint effusions or deformity Ext no LE or UE edema. no wounds or ulcers Neuro is nofocal, Ox 3, bedbound

## 2020-08-24 DIAGNOSIS — E118 Type 2 diabetes mellitus with unspecified complications: Secondary | ICD-10-CM | POA: Diagnosis not present

## 2020-08-24 DIAGNOSIS — U071 COVID-19: Secondary | ICD-10-CM | POA: Diagnosis not present

## 2020-08-24 DIAGNOSIS — I10 Essential (primary) hypertension: Secondary | ICD-10-CM | POA: Diagnosis not present

## 2020-08-24 DIAGNOSIS — E440 Moderate protein-calorie malnutrition: Secondary | ICD-10-CM | POA: Insufficient documentation

## 2020-08-24 DIAGNOSIS — N179 Acute kidney failure, unspecified: Secondary | ICD-10-CM | POA: Diagnosis not present

## 2020-08-24 LAB — MAGNESIUM: Magnesium: 2 mg/dL (ref 1.7–2.4)

## 2020-08-24 LAB — BASIC METABOLIC PANEL
Anion gap: 5 (ref 5–15)
BUN: 59 mg/dL — ABNORMAL HIGH (ref 8–23)
CO2: 24 mmol/L (ref 22–32)
Calcium: 6.5 mg/dL — ABNORMAL LOW (ref 8.9–10.3)
Chloride: 102 mmol/L (ref 98–111)
Creatinine, Ser: 3.16 mg/dL — ABNORMAL HIGH (ref 0.61–1.24)
GFR, Estimated: 20 mL/min — ABNORMAL LOW (ref >60)
Glucose, Bld: 128 mg/dL — ABNORMAL HIGH (ref 70–99)
Potassium: 3.4 mmol/L — ABNORMAL LOW (ref 3.5–5.1)
Sodium: 131 mmol/L — ABNORMAL LOW (ref 135–145)

## 2020-08-24 LAB — GLUCOSE, CAPILLARY
Glucose-Capillary: 98 mg/dL (ref 70–99)
Glucose-Capillary: 194 mg/dL — ABNORMAL HIGH (ref 70–99)
Glucose-Capillary: 202 mg/dL — ABNORMAL HIGH (ref 70–99)
Glucose-Capillary: 137 mg/dL — ABNORMAL HIGH (ref 70–99)
Glucose-Capillary: 221 mg/dL — ABNORMAL HIGH (ref 70–99)

## 2020-08-24 LAB — CBC
HCT: 21.7 % — ABNORMAL LOW (ref 39.0–52.0)
Hemoglobin: 7.4 g/dL — ABNORMAL LOW (ref 13.0–17.0)
MCH: 32.5 pg (ref 26.0–34.0)
MCHC: 34.1 g/dL (ref 30.0–36.0)
MCV: 95.2 fL (ref 80.0–100.0)
Platelets: 17 10*3/uL — CL (ref 150–400)
RBC: 2.28 MIL/uL — ABNORMAL LOW (ref 4.22–5.81)
RDW: 16.1 % — ABNORMAL HIGH (ref 11.5–15.5)
WBC: 5.9 10*3/uL (ref 4.0–10.5)
nRBC: 0 % (ref 0.0–0.2)

## 2020-08-24 LAB — SARS CORONAVIRUS 2 (TAT 6-24 HRS): SARS Coronavirus 2: POSITIVE — AB

## 2020-08-24 MED ORDER — OSMOLITE 1.5 CAL PO LIQD
1000.0000 mL | ORAL | Status: DC
  Administered 2020-08-24 – 2020-08-25 (×3): 1000 mL
  Filled 2020-08-24 (×4): qty 1000

## 2020-08-24 NOTE — Progress Notes (Signed)
PROGRESS NOTE    Jeff Rodriguez  QZE:[id number] DOB: 1945-05-28 DOA: 08/11/2020 PCP: [facility]    Chief Complaint  Patient presents with   Cough    Brief Narrative:  75 y.o. male with medical history significant of multiple myeloma (on [name]), diabetes, hypertension, asthma, hyperlipidemia and osteoarthritis who was brought in by EMS secondary to productive cough, poor oral intake and not eating much.  Patient is currently undergoing chemo for his multiple myeloma. Tested positive for COVID 19, found to have hypokalemic and acute renal failure.  Nephrology team following.  Due to need repeat thrombocytopenia, oncology consulted.  Neupogen given.  Bone scan 08/18/2020 negative for lytic lesions.  Due to poor prognosis, palliative care team consulted and patient decided for CRRT.  Per nephrology patient to be transferred to [facility] for HD.  He has received 2 units of prbc transfusion so far.  Pt seen and examined at bedside, no new complaints.    Assessment & Plan:   Principal Problem:   Acute renal failure superimposed on stage 3b chronic kidney disease (HCC) Active Problems:   Hyperlipidemia   Diabetes mellitus with complication (HCC)   Essential hypertension   COVID-19 virus infection   Hypokalemia   Pancytopenia (HCC)   Multiple myeloma (HCC)   Failure to thrive in adult   Pressure injury of skin  Acute on stage IIIb CKD Patient is currently scheduled for hemodialysis. Baseline creatinine around 1.7-2 Patient was admitted with a creatinine of 7. AKI probably secondary to dehydration , ATN from hypotension and ACE. Due to decline in responsiveness and AKI, he was initially started on CRRT, on 6/29 with improvement in the mental status and renal parameters. Currently he is on HD. Urine output is  1000 ml overnight.  Ultrasound of the kidneys did not show any hydronephrosis or obstructive pathology Creatinine around 3.2 today.    Acute metabolic  encephalopathy Improving , patient is alert answering questions appropriately. Initial CT of the head is negative for acute intracranial abnormalities. TSH, vitamin B12 normal, ammonia level within normal limits   Type 2 diabetes mellitus Insulin dependent at home.  On SSI here.  CBG (last 3)  Recent Labs    08/23/20 2347 08/24/20 0347 08/24/20 0752  GLUCAP 148* 98 194*    Hemoglobin A1c is around 8.4. Poor oral intake. Holding lantus at this time.    COVID 19 virus infection.  Completed the course of remdesivir, continue with flutter valve and incentive spirometer and inhalers as needed. Therapy evaluations recommending SNF.    Hypokalemia replaced.    Hypertension:  Well controlled.   Hyperlipidemia   Multiple myeloma Dr. Ennever/oncology on board Bone scan does not show any lytic lesions at this time.   Anemia of chronic disease Transfuse to keep hemoglobin greater than 7   Severe thrombocytopenia Platelets around 17,000, no evidence of bleeding at this time. Discussed with Dr. Marin Olp recommended no platelet transfusion at this time as he is not bleeding.   Pressure injury on the back.  Pressure Injury 08/21/20 Coccyx Right;Left;Mid Stage 2 -  Partial thickness loss of dermis presenting as a shallow open injury with a red, pink wound bed without slough. (Active)  08/21/20 2000  Location: Coccyx  Location Orientation: Right;Left;Mid  Staging: Stage 2 -  Partial thickness loss of dermis presenting as a shallow open injury with a red, pink wound bed without slough.  Wound Description (Comments):   Present on Admission:      Pressure Injury  08/24/20 Coccyx Medial Stage 2 -  Partial thickness loss of dermis presenting as a shallow open injury with a red, pink wound bed without slough. pink open (Active)  08/24/20 0910  Location: Coccyx  Location Orientation: Medial  Staging: Stage 2 -  Partial thickness loss of dermis presenting as a shallow open injury  with a red, pink wound bed without slough.  Wound Description (Comments): pink open  Present on Admission: No   Wound care  will be consulted.    Failure to thrive Dietary will be consulted.   In view of multiple comorbidities, clinical deterioration, hemodialysis dependent, AKI, poor progression palliative care consulted for goals of care discussion.    DVT prophylaxis: heparin.  Code Status: Full code.  Family Communication: None at bedside.  Disposition:   Status is: Inpatient  Remains inpatient appropriate because:Ongoing diagnostic testing needed not appropriate for outpatient work up and Unsafe d/c plan  Dispo: The patient is from: Home              Anticipated d/c is to:  pending              Patient currently is not medically stable to d/c.   Difficult to place patient No       Consultants:  None.   Procedures: none.   Antimicrobials:  Antibiotics Given (last 72 hours)     None         Subjective: No chest pain or sob. No nausea,   Objective: Vitals:   08/24/20 0350 08/24/20 0355 08/24/20 0400 08/24/20 0749  BP: 120/66  122/66 116/62  Pulse: 91  89 89  Resp: 20  (!) 22 20  Temp: 98.4 F (36.9 C)   97.9 F (36.6 C)  TempSrc:    Oral  SpO2: 97%  96% 98%  Weight:  73.6 kg    Height:        Intake/Output Summary (Last 24 hours) at 08/24/2020 1202 Last data filed at 08/24/2020 0500 Gross per 24 hour  Intake 832 ml  Output 1000 ml  Net -168 ml    Filed Weights   08/23/20 0400 08/23/20 1945 08/24/20 0355  Weight: 72.4 kg 73.6 kg 73.6 kg    Examination:  General exam: alert and comfortable, on RA.  Respiratory system: air entry fair, bilateral, no wheezing heard, on RA.  Cardiovascular system: S1 & S2 heard, RRR, no JVD, no pedal edema.  Gastrointestinal system: Abdomen is soft, non tender non distended bowel sounds wnl Central nervous system: Alert and answering questions appropriately.  Extremities: no pedal edema.  Skin: stage 2  coccyx pressure injury.  Psychiatry: cannot be assessed.     Data Reviewed: I have personally reviewed following labs and imaging studies  CBC: Recent Labs  Lab 08/18/20 0316 08/19/20 0346 08/20/20 0000 08/20/20 0500 08/21/20 0530 08/21/20 0731 08/22/20 0533 08/22/20 1603 08/23/20 0550 08/24/20 0440  WBC 1.3* 1.7* 2.3* 2.6* 4.6  --  5.7  --  5.3 5.9  NEUTROABS 0.4* 0.8* 1.2* 1.5* 3.5  --   --   --   --   --   HGB 8.4* 7.5* 7.9* 8.1* 7.9*  --  7.0* 7.7* 7.6* 7.4*  HCT 25.3* 22.4* 23.2* 23.5* 23.9* 24.6* 21.7* 23.5* 22.3* 21.7*  MCV 94.4 93.3 91.0 91.4 94.1  --  96.9  --  96.5 95.2  PLT 23* 19* 35* 33* 24*  --  22*  --  19* 17*     Basic Metabolic Panel: Recent  Labs  Lab 08/20/20 0500 08/20/20 1857 08/21/20 0530 08/21/20 1555 08/22/20 0533 08/23/20 0550 08/23/20 1612 08/24/20 0440  NA 133*   < > 135 135 136 133* 131* 131*  K 3.1*   < > 3.4* 4.0 3.5 3.5 3.5 3.4*  CL 101   < > 101 104 102 103 101 102  CO2 25   < > _0 GLUCOSE 167*   < > 182* 138* 154* 151* 165* 128*  BUN 69*   < > 33* 25* 35* 42* 52* 59*  CREATININE 4.74*   < > 2.15* 1.60* 2.28* 2.60* 2.91* 3.16*  CALCIUM 6.8*   < > 7.3* 7.1* 7.1* 6.8* 6.8* 6.5*  MG 2.1  --  2.2  --  2.4 2.0  --  2.0  PHOS 3.7   < > 1.9* 2.1* 2.7 2.3* 2.1*  --    < > = values in this interval not displayed.     GFR: Estimated Creatinine Clearance: 19.8 mL/min (A) (by C-G formula based on SCr of 3.16 mg/dL (H)).  Liver Function Tests: Recent Labs  Lab 08/18/20 0316 08/19/20 0346 08/19/20 1629 08/20/20 0500 08/20/20 1857 08/21/20 0530 08/21/20 1555 08/22/20 0533 08/23/20 0550 08/23/20 1612  AST 15 16  --  18  --  18  --  13*  --   --   ALT 14 14  --  13  --  12  --  11  --   --   ALKPHOS 37* 38  --  35*  --  39  --  37*  --   --   BILITOT 0.5 0.6  --  0.8  --  0.6  --  0.7  --   --   PROT 4.9* 5.1*  --  5.3*  --  5.8*  --  5.5*  --   --   ALBUMIN 1.9* 1.8*   < > 1.9*  1.9*   < > 1.9*  1.9* 2.0*  2.0*  1.9* 1.6* 1.7*   < > = values in this interval not displayed.     CBG: Recent Labs  Lab 08/23/20 1621 08/23/20 1945 08/23/20 2347 08/24/20 0347 08/24/20 0752  GLUCAP 161* 152* 148* 98 194*      Recent Results (from the past 240 hour(s))  Culture, Urine     Status: Abnormal   Collection Time: 08/14/20  3:11 PM   Specimen: Urine, Random  Result Value Ref Range Status   Specimen Description   Final    URINE, RANDOM Performed at [facility] [address] [place], [place] [address]    Special Requests   Final    NONE Performed at [facility], [place] [address], [place], [place] [address]    Culture MULTIPLE SPECIES PRESENT, SUGGEST RECOLLECTION (A)  Final   Report Status 08/15/2020 FINAL  Final  MRSA PCR Screening     Status: None   Collection Time: 08/19/20  5:53 PM  Result Value Ref Range Status   MRSA by PCR NEGATIVE NEGATIVE Final    Comment:        The GeneXpert MRSA Assay (FDA approved for NASAL specimens only), is one component of a comprehensive MRSA colonization surveillance program. It is not intended to diagnose MRSA infection nor to guide or monitor treatment for MRSA infections. Performed at [facility], [place] [address], [place],  [address]           Radiology  Studies: No results found.      Scheduled Meds:  calcium carbonate  1,000 mg of elemental calcium Per Tube BID WC   chlorhexidine  15 mL Mouth Rinse BID   Chlorhexidine Gluconate Cloth  6 each Topical Q0600   darbepoetin (ARANESP) injection - DIALYSIS  60 mcg Intravenous Q Mon-HD   feeding supplement (PROSource TF)  90 mL Per Tube BID   finasteride  5 mg Oral Daily   free water  50 mL Per Tube 6 X Daily   insulin aspart  0-15 Units Subcutaneous Q4H   Ipratropium-Albuterol  1 puff Inhalation Q6H   lidocaine  1 patch Transdermal Q24H   magic mouthwash  10 mL Oral TID   mouth rinse  15 mL Mouth Rinse q12n4p    metoCLOPramide (REGLAN) injection  5 mg Intravenous Q8H   tamsulosin  0.4 mg Oral QHS   Continuous Infusions:  sodium chloride Stopped (08/22/20 1544)   feeding supplement (VITAL 1.5 CAL) 1,000 mL (08/23/20 1709)   promethazine (PHENERGAN) injection (IM or IVPB) Stopped (08/21/20 0559)     LOS: 13 days       Hosie Poisson, MD [facility]   To contact the attending provider between 7A-7P or the covering provider during after hours 7P-7A, please log into the web site [email or web address] and access using universal [place] password for that web site. If you do not have the password, please call the hospital operator.  08/24/2020, 12:02 PM

## 2020-08-24 NOTE — Progress Notes (Signed)
Nutrition Follow-up  DOCUMENTATION CODES:   Non-severe (moderate) malnutrition in context of acute illness/injury  INTERVENTION:   -D/c Vital 1.5  Initiate Osmolite 1.5 @ 50 ml/hr via NGT  90 ml Prosource TF BID  50 ml free water flush 6 times daily per MD  Tube feeding regimen provides 1960 kcal (98% of needs), 119 grams of protein, and 914 ml of H2O.  Total free water: 1214 ml daily  -May need to consider long-term permanent feeding access. Discussed with MD, who will discuss with pt and family  NUTRITION DIAGNOSIS:   Moderate Malnutrition related to acute illness (COVID-19) as evidenced by mild fat depletion, mild muscle depletion, moderate muscle depletion.  Ongoing  GOAL:   Patient will meet greater than or equal to 90% of their needs  Met with TF  MONITOR:   PO intake, Labs, Weight trends, TF tolerance, Skin, I & O's  REASON FOR ASSESSMENT:   Consult Enteral/tube feeding initiation and management  ASSESSMENT:   Pt with a PMH significant of multiple myeloma (currently undergoing chemo), DM, HTN, HLD, asthma, and OA admitted with hypokalemia, AKI as well as COVID-19 infection  6/29- NGT placed (proximal duodenum) 7/2- transferred to [facility], CRRT d/c  Reviewed I/O's: -418 ml x 24 hours and +5.3 L since admission  UOP: 1 L x 24 hours  Per CWOCN notes, pt with stage 2 pressure injury to coccyx.   Spoke with pt at bedside, who was minimally interactive with this RD. He reports feeling well and denies any nausea, vomiting, or abdominal pain. He fell back asleep when asked further questions.   Vital 1.5 infusing at 50 ml/hr with 90 ml Prosource Plus BID and 50 ml free water flush 6 times daily Complete TF regimen provides 1960 kcals, 125 grams protein, and 1217 ml free water daily.   Per nephrology notes, continue to await improvement in kidney function. No plans for renal replacement therapy today, but will maintain HD cath.   RD suspects underlying malnutrition  due to multiple myeloma, however, likely exacerbated secondary to COVID-19.   Medications reviewed and include os-cal with vitamin D, aranesp and reglan.   As pt has been transferred out of ICU and tolerating semi-elemental formula now, will transition to a standard formula (eventual plan to d/c to SNF). RD will transition to Osmolite formula due to lower osmolality and lower K than Vital 1.5   Pt with with very poor oral intake secondary to mentation. May need to consider permanent feeding access (ex PEG) if this is within pt's goals of care. Discussed with MD, who will speak with family regarding desire for permanent feeding access.   Labs reviewed: Na: 131, K: 3.4, CBGS: 98-194 (inpatient orders for glycemic control are 0-15 units insulin aspart every 4 hours).    NUTRITION - FOCUSED PHYSICAL EXAM:  Flowsheet Row Most Recent Value  Orbital Region Mild depletion  Upper Arm Region Mild depletion  Thoracic and Lumbar Region No depletion  Buccal Region Mild depletion  Temple Region Mild depletion  Clavicle Bone Region Moderate depletion  Clavicle and Acromion Bone Region Moderate depletion  Scapular Bone Region Moderate depletion  Dorsal Hand Mild depletion  Patellar Region Moderate depletion  Anterior Thigh Region Moderate depletion  Posterior Calf Region Moderate depletion  Edema (RD Assessment) None  Hair Reviewed  Eyes Reviewed  Mouth Reviewed  Skin Reviewed  Nails Reviewed       Diet Order:   Diet Order  Diet renal/carb modified with fluid restriction Diet-HS Snack? Nothing; Fluid restriction: 1200 mL Fluid; Room service appropriate? Yes; Fluid consistency: Thin  Diet effective now                   EDUCATION NEEDS:   Not appropriate for education at this time  Skin:  Skin Assessment: Skin Integrity Issues: Skin Integrity Issues:: Stage II Stage I: - Stage II: coccyx  Last BM:  08/24/20 (via rectal tube)  Height:   Ht Readings from Last 1  Encounters:  08/11/20 $RemoveB'5\' 8"'URbCEJHA$  (1.727 m)    Weight:   Wt Readings from Last 1 Encounters:  08/24/20 73.6 kg    Ideal Body Weight:  70 kg  BMI:  Body mass index is 24.67 kg/m.  Estimated Nutritional Needs:   Kcal:  2000-2200  Protein:  120-135 grams  Fluid:  > 2 L    Loistine Chance, RD, LDN, [place] Registered Dietitian II Certified Diabetes Care and Education Specialist Please refer to [facility] for RD and/or RD on-call/weekend/after hours pager

## 2020-08-24 NOTE — Consult Note (Signed)
[facility] Nurse wound consult note Consultation was completed by review of records, images and assistance from the bedside nurse/clinical staff.   Reason for Consult: pressure injury Wound type: Stage 2 coccyx  Pressure Injury POA: No Measurement: see nursing flow sheets Wound bed: 100% pink, clean Drainage (amount, consistency, odor) none per nursing flow sheets Periwound: intact  Dressing procedure/placement/frequency:  Continue silicone foam for protection, insulation per the skin care order set Turn and reposition for pressure redistribution  Change foam every 3 days and PRN soilage, lift to inspect under foam each shift for acute changes in the wound and reconsult if needed.   Re consult if needed, will not follow at this time. Thanks  Jenea Dake [employer], RN,CWOCN, CNS, [place] [phone])

## 2020-08-24 NOTE — Progress Notes (Signed)
Subjective:  no acute events overnight. Not engaging with conversation. When prompted patient denies chest pain, sob, swelling, n/v, dysgeusia, loss of appetite, brain fog, hiccups, itchiness.    Objective Vital signs in last 24 hours: Vitals:   08/24/20 0350 08/24/20 0355 08/24/20 0400 08/24/20 0749  BP: 120/66  122/66 116/62  Pulse: 91  89 89  Resp: 20  (!) 22 20  Temp: 98.4 F (36.9 C)   97.9 F (36.6 C)  TempSrc:    Oral  SpO2: 97%  96% 98%  Weight:  73.6 kg    Height:       Weight change: 1.2 kg  Intake/Output Summary (Last 24 hours) at 08/24/2020 1109 Last data filed at 08/24/2020 0500 Gross per 24 hour  Intake 832 ml  Output 1000 ml  Net -168 ml   Physical Exam: Gen: awake but lethargic, aaox3 HEENT: anicteric sclera Chest: s1s2, rrr Resp: cta bl Abd: soft, nt/nd Msk; no edema Neuro: awake but lethargic, speech clear and coherent     Labs: Basic Metabolic Panel: Recent Labs  Lab 08/22/20 0533 08/23/20 0550 08/23/20 1612 08/24/20 0440  NA 136 133* 131* 131*  K 3.5 3.5 3.5 3.4*  CL 102 103 101 102  CO2 $Re'27 24 23 24  'tMI$ GLUCOSE 154* 151* 165* 128*  BUN 35* 42* 52* 59*  CREATININE 2.28* 2.60* 2.91* 3.16*  CALCIUM 7.1* 6.8* 6.8* 6.5*  PHOS 2.7 2.3* 2.1*  --    Liver Function Tests: Recent Labs  Lab 08/20/20 0500 08/20/20 1857 08/21/20 0530 08/21/20 1555 08/22/20 0533 08/23/20 0550 08/23/20 1612  AST 18  --  18  --  13*  --   --   ALT 13  --  12  --  11  --   --   ALKPHOS 35*  --  39  --  37*  --   --   BILITOT 0.8  --  0.6  --  0.7  --   --   PROT 5.3*  --  5.8*  --  5.5*  --   --   ALBUMIN 1.9*  1.9*   < > 1.9*  1.9*   < > 2.0*  1.9* 1.6* 1.7*   < > = values in this interval not displayed.   No results for input(s): LIPASE, AMYLASE in the last 168 hours. Recent Labs  Lab 08/22/20 1206  AMMONIA 13   CBC: Recent Labs  Lab 08/20/20 0000 08/20/20 0500 08/21/20 0530 08/21/20 0731 08/22/20 0533 08/22/20 1603 08/23/20 0550  08/24/20 0440  WBC 2.3* 2.6* 4.6  --  5.7  --  5.3 5.9  NEUTROABS 1.2* 1.5* 3.5  --   --   --   --   --   HGB 7.9* 8.1* 7.9*  --  7.0* 7.7* 7.6* 7.4*  HCT 23.2* 23.5* 23.9*   < > 21.7* 23.5* 22.3* 21.7*  MCV 91.0 91.4 94.1  --  96.9  --  96.5 95.2  PLT 35* 33* 24*  --  22*  --  19* 17*   < > = values in this interval not displayed.   Cardiac Enzymes: No results for input(s): CKTOTAL, CKMB, CKMBINDEX, TROPONINI in the last 168 hours. CBG: Recent Labs  Lab 08/23/20 1621 08/23/20 1945 08/23/20 2347 08/24/20 0347 08/24/20 0752  GLUCAP 161* 152* 148* 98 194*    Iron Studies:  No results for input(s): IRON, TIBC, TRANSFERRIN, FERRITIN in the last 72 hours.  Studies/Results: No results found. Medications: Infusions:  sodium  chloride Stopped (08/22/20 1544)   feeding supplement (VITAL 1.5 CAL) 1,000 mL (08/23/20 1709)   promethazine (PHENERGAN) injection (IM or IVPB) Stopped (08/21/20 0559)    Scheduled Medications:  calcium carbonate  1,000 mg of elemental calcium Per Tube BID WC   chlorhexidine  15 mL Mouth Rinse BID   Chlorhexidine Gluconate Cloth  6 each Topical Q0600   darbepoetin (ARANESP) injection - DIALYSIS  60 mcg Intravenous Q Mon-HD   feeding supplement (PROSource TF)  90 mL Per Tube BID   finasteride  5 mg Oral Daily   free water  50 mL Per Tube 6 X Daily   insulin aspart  0-15 Units Subcutaneous Q4H   Ipratropium-Albuterol  1 puff Inhalation Q6H   lidocaine  1 patch Transdermal Q24H   magic mouthwash  10 mL Oral TID   mouth rinse  15 mL Mouth Rinse q12n4p   metoCLOPramide (REGLAN) injection  5 mg Intravenous Q8H   tamsulosin  0.4 mg Oral QHS    have reviewed scheduled and prn medications.      Assessment/ Plan: Pt is a 75 y.o. yo male with multiple myeloma who was admitted on 08/11/2020 with COVID-  A on CRT-  crt 1.7 in Jan 2022-  now 7- non oliguric   1. A on CRF-  baseline creat 1.7 in Jan 2022. Renal u/s neg for obstruction. UA unrevealing.  Suspect  ATN from hypotension/ ACE.  Myeloma kidney unlikely- per ONC w/u current myeloma testing is negative. Initially pt was refusing dialysis, but then changed his mind. Due to decline in responsiveness and persistent azotemia (creat in 7's), pt was started on CRRT 6/29. Pt became more alert and responsive w/ CRRT , however he has remained very unmotivated. CRRT dc'd on 7/02 when pt was transferred to [facility].  Waiting for kidney function to settle out since he is been off of renal replacement therapy, cr up to 3.2 but urine output adequate. Will monitor for further renal replacement therapy needs but no urgent indications present at the moment. Maintain dialysis catheter 2. HTN/volume-  no vol excess on exam today 3. Anemia-  blood transfusion 6/23, Hb in 7-8 range, ordered darbe 60 ug weekly IV to start Monday/today 4. Hypocalcemia-  corrected is around 7.7-  no sxms - no action needed -  got bolus during hosp 5. Hx multiple myeloma - no evidence here of active disease, see ONC notes 6. Pancytopenia - per Hem-Onc 7.  COVID 19 infection - sp course of remdesivir 8. Hyponatremia, mild: likely related to AKI  Gean Quint, MD [place] 08/24/2020, 11:09 AM

## 2020-08-24 NOTE — TOC Progression Note (Signed)
Transition of Care [facility]) - Progression Note    Patient Details  Name: Jeff Rodriguez MRN: [id number] Date of Birth: [birth year]  Transition of Care [facility]) CM/SW [place], LCSW Phone Number: 08/24/2020, 12:11 PM  Clinical Narrative:    Patient transferred from [facility]. CSW will continue to follow for SNF placement and bed offers through his Medicare.    Expected Discharge Plan: Skilled Nursing Facility Barriers to Discharge: SNF Pending bed offer  Expected Discharge Plan and Services Expected Discharge Plan: [place]   Discharge Planning Services: CM Consult Post Acute Care Choice: [place] Living arrangements for the past 2 months: Single Family Home                                       Social Determinants of Health (SDOH) Interventions    Readmission Risk Interventions No flowsheet data found.

## 2020-08-25 DIAGNOSIS — N179 Acute kidney failure, unspecified: Secondary | ICD-10-CM | POA: Diagnosis not present

## 2020-08-25 DIAGNOSIS — E118 Type 2 diabetes mellitus with unspecified complications: Secondary | ICD-10-CM | POA: Diagnosis not present

## 2020-08-25 DIAGNOSIS — I10 Essential (primary) hypertension: Secondary | ICD-10-CM | POA: Diagnosis not present

## 2020-08-25 DIAGNOSIS — U071 COVID-19: Secondary | ICD-10-CM | POA: Diagnosis not present

## 2020-08-25 LAB — BASIC METABOLIC PANEL
Anion gap: 11 (ref 5–15)
BUN: 63 mg/dL — ABNORMAL HIGH (ref 8–23)
CO2: 21 mmol/L — ABNORMAL LOW (ref 22–32)
Calcium: 6.9 mg/dL — ABNORMAL LOW (ref 8.9–10.3)
Chloride: 99 mmol/L (ref 98–111)
Creatinine, Ser: 3.56 mg/dL — ABNORMAL HIGH (ref 0.61–1.24)
GFR, Estimated: 17 mL/min — ABNORMAL LOW (ref >60)
Glucose, Bld: 122 mg/dL — ABNORMAL HIGH (ref 70–99)
Potassium: 3.5 mmol/L (ref 3.5–5.1)
Sodium: 131 mmol/L — ABNORMAL LOW (ref 135–145)

## 2020-08-25 LAB — CBC
HCT: 23.3 % — ABNORMAL LOW (ref 39.0–52.0)
Hemoglobin: 8.1 g/dL — ABNORMAL LOW (ref 13.0–17.0)
MCH: 33.3 pg (ref 26.0–34.0)
MCHC: 34.8 g/dL (ref 30.0–36.0)
MCV: 95.9 fL (ref 80.0–100.0)
Platelets: 22 10*3/uL — CL (ref 150–400)
RBC: 2.43 MIL/uL — ABNORMAL LOW (ref 4.22–5.81)
RDW: 15.8 % — ABNORMAL HIGH (ref 11.5–15.5)
WBC: 4.8 10*3/uL (ref 4.0–10.5)
nRBC: 0 % (ref 0.0–0.2)

## 2020-08-25 LAB — GLUCOSE, CAPILLARY
Glucose-Capillary: 133 mg/dL — ABNORMAL HIGH (ref 70–99)
Glucose-Capillary: 140 mg/dL — ABNORMAL HIGH (ref 70–99)
Glucose-Capillary: 143 mg/dL — ABNORMAL HIGH (ref 70–99)
Glucose-Capillary: 159 mg/dL — ABNORMAL HIGH (ref 70–99)
Glucose-Capillary: 162 mg/dL — ABNORMAL HIGH (ref 70–99)
Glucose-Capillary: 188 mg/dL — ABNORMAL HIGH (ref 70–99)
Glucose-Capillary: 209 mg/dL — ABNORMAL HIGH (ref 70–99)

## 2020-08-25 LAB — MAGNESIUM: Magnesium: 1.7 mg/dL (ref 1.7–2.4)

## 2020-08-25 MED ORDER — IPRATROPIUM-ALBUTEROL 20-100 MCG/ACT IN AERS: 1.0000 | INHALATION_SPRAY | Freq: Three times a day (TID) | RESPIRATORY_TRACT | Status: DC

## 2020-08-25 MED ORDER — IPRATROPIUM-ALBUTEROL 20-100 MCG/ACT IN AERS
1.0000 | INHALATION_SPRAY | Freq: Three times a day (TID) | RESPIRATORY_TRACT | Status: DC
  Administered 2020-08-25 – 2020-09-03 (×17): 1 via RESPIRATORY_TRACT

## 2020-08-25 NOTE — TOC Progression Note (Addendum)
Transition of Care [facility]) - Progression Note    Patient Details  Name: Jeff Rodriguez MRN: [id number] Date of Birth: [birth year]  Transition of Care [facility]) CM/SW [place], LCSW Phone Number: 08/25/2020, 1:45 PM  Clinical Narrative:    This unit CSW received patient from [facility]. CSW contacted patient's son, [name], to assess his understanding of the SNF process completed at [facility]. He reported that he is wanting patient to go to SNF in [place], [place], where he lives. CSW explained potential barriers to that, including cost of transportation/difficulty of patient riding in a car that distance and that patient's family would have to locate an accepting facility there. CSW following up with the [place] on if patient needs to use his [place] Medicare benefits for placement and will conduct local SNF search.   CSW lefts voicemails for the following facilities near [place] to inquire if they are even in network with patient's insurance:   -[facility] in [place], [place] (([phone])  -[facility] [phone]) -[place] ([place] [phone]) -[facility] and [facility] [phone])   CSW confirmed with April at the [place] that the patient is 100% vested with the [place]. CSW faxed SNF referral to them in case patient requires a [facility] bed (f. [phone]).   Expected Discharge Plan: Skilled Nursing Facility Barriers to Discharge: SNF Pending bed offer  Expected Discharge Plan and Services Expected Discharge Plan: [place]   Discharge Planning Services: CM Consult Post Acute Care Choice: [place] Living arrangements for the past 2 months: Single Family Home                                       Social Determinants of Health (SDOH) Interventions    Readmission Risk Interventions No  flowsheet data found.

## 2020-08-25 NOTE — Plan of Care (Signed)
  Problem: Clinical Measurements: Goal: Cardiovascular complication will be avoided Outcome: Progressing   Problem: Nutrition: Goal: Adequate nutrition will be maintained Outcome: Progressing   Problem: Pain Managment: Goal: General experience of comfort will improve Outcome: Progressing   Problem: Safety: Goal: Ability to remain free from injury will improve Outcome: Progressing   

## 2020-08-25 NOTE — Progress Notes (Signed)
Physical Therapy Treatment Patient Details Name: Jeff Rodriguez MRN: [id number] DOB: September 11, 1945 Today's Date: 08/25/2020    History of Present Illness patient is a 75 year old male who was admitted to the hospital on 6/22 with COVID 19 and AKI; at [facility], had initiated Renal Replacement Therapy, transferred to [facility];  past medical history significant for multiple myeloma, diabetes mellitus, hypertension, asthma, hyperlipidemia, osteoarthritis.    PT Comments    Patient received in bed, pleasant and cooperative with PT session. Continues to require heavy levels of physical assistance for all mobility tasks, but was able to initiate better and perform larger percentages of physical tasks at hand. Tolerated performing multiple transfers to/from bed, recliner, and BSC today but did need cues for safety and sequencing especially as he tends to sit very early. Unable to have BM today. HR up to as much as 122BPM with activity, SpO2 WNL RA. Left up in recliner with all needs met, chair alarm active and RN aware of patient status.     Follow Up Recommendations  SNF     Equipment Recommendations  Rolling walker with 5" wheels    Recommendations for Other Services       Precautions / Restrictions Precautions Precautions: Fall Precaution Comments: cortrak Restrictions Weight Bearing Restrictions: No    Mobility  Bed Mobility Overal bed mobility: Needs Assistance Bed Mobility: Supine to Sit     Supine to sit: Mod assist;HOB elevated     General bed mobility comments: able to progress LEs more on his own today, but ultimately did need help legs all the way off of EOB, also needed ModA to scoot all the way forward and square up    Transfers Overall transfer level: Needs assistance Equipment used: Rolling walker (2 wheeled) Transfers: Sit to/from [facility] Sit to Stand: Max assist Stand pivot transfers: Min assist;+2 physical assistance       General transfer  comment: heavy MaxA to boost up to full standing position, heavy cues for hand placement and sequencing; once up and balanced, able to perform multiple stand pivot transfers with MinAx2 and RW  Ambulation/Gait             General Gait Details: deferred- fatigue after multiple transfers   Stairs             Wheelchair Mobility    Modified Rankin (Stroke Patients Only)       Balance Overall balance assessment: Needs assistance Sitting-balance support: No upper extremity supported Sitting balance-Leahy Scale: Fair     Standing balance support: Bilateral upper extremity supported Standing balance-Leahy Scale: Poor Standing balance comment: reliant on UEs and external support                            Cognition Arousal/Alertness: Awake/alert Behavior During Therapy: WFL for tasks assessed/performed Overall Cognitive Status: Within Functional Limits for tasks assessed                                 General Comments: general impaired awareness of safety and deficits      Exercises      General Comments General comments (skin integrity, edema, etc.): Spo2 >90% on RA, HR as much as 122 BPM with activiity; describes some dizziness that sounds consistent with possible BPPV      Pertinent Vitals/Pain Pain Assessment: Faces Faces Pain Scale: Hurts little more Pain Location:  generalized/stiffness Pain Descriptors / Indicators: Discomfort;Sore;Grimacing Pain Intervention(s): Limited activity within patient's tolerance;Monitored during session    Home Living                      Prior Function            PT Goals (current goals can now be found in the care plan section) Acute Rehab PT Goals Patient Stated Goal: Did not state, but participated in simple mobility tasks PT Goal Formulation: With patient Time For Goal Achievement: 08/28/20 Potential to Achieve Goals: Good Progress towards PT goals: Progressing toward goals  (slowly)    Frequency    Min 2X/week      PT Plan Current plan remains appropriate    Co-evaluation              AM-PAC PT "6 Clicks" Mobility   Outcome Measure  Help needed turning from your back to your side while in a flat bed without using bedrails?: A Little Help needed moving from lying on your back to sitting on the side of a flat bed without using bedrails?: A Lot Help needed moving to and from a bed to a chair (including a wheelchair)?: A Lot Help needed standing up from a chair using your arms (e.g., wheelchair or bedside chair)?: A Lot Help needed to walk in hospital room?: Total Help needed climbing 3-5 steps with a railing? : Total 6 Click Score: 11    End of Session Equipment Utilized During Treatment: Gait belt Activity Tolerance: Patient limited by fatigue Patient left: in chair;with call bell/phone within reach;with chair alarm set Nurse Communication: Mobility status;Need for lift equipment PT Visit Diagnosis: Other abnormalities of gait and mobility (R26.89);Muscle weakness (generalized) (M62.81)     Time: 0539-7673 PT Time Calculation (min) (ACUTE ONLY): 23 min  Charges:  $Therapeutic Activity: 23-37 mins                    Windell Norfolk, DPT, PN1   Supplemental Physical Therapist [place]    Pager [phone] Acute Rehab Office [phone]

## 2020-08-25 NOTE — Progress Notes (Signed)
PROGRESS NOTE    Jeff Rodriguez  BEM:[id number] DOB: September 26, 1945 DOA: 08/11/2020 PCP: [facility]    Chief Complaint  Patient presents with   Cough    Brief Narrative:  75 y.o. male with medical history significant of multiple myeloma (on [name]), diabetes, hypertension, asthma, hyperlipidemia and osteoarthritis who was brought in by EMS secondary to productive cough, poor oral intake and not eating much.  Patient is currently undergoing chemo for his multiple myeloma. Tested positive for COVID 19, found to be hypokalemic and  in acute renal failure.  Nephrology team following.  in view of severe thrombocytopenia, oncology consulted and neupogen given. Bone scan 08/18/2020 negative for lytic lesions.  Due to poor prognosis, palliative care team consulted and patient decided for CRRT.  Per nephrology patient was transferred to [facility] for HD.   He has received 2 units of prbc transfusion so far.  Pt seen and examined at bedside, no new complaints.    Assessment & Plan:   Principal Problem:   Acute renal failure superimposed on stage 3b chronic kidney disease (HCC) Active Problems:   Hyperlipidemia   Diabetes mellitus with complication (HCC)   Essential hypertension   COVID-19 virus infection   Hypokalemia   Pancytopenia (HCC)   Multiple myeloma (HCC)   Failure to thrive in adult   Pressure injury of skin   Malnutrition of moderate degree  Acute on stage IIIb CKD Baseline creatinine around 1.7-2 Patient was admitted with a creatinine of 7. AKI probably secondary to dehydration , ATN from hypotension and ACE. Due to decline in responsiveness and AKI, he was initially started on CRRT, on 6/29 with improvement in the mental status and renal parameters.  He was transferred to [facility] for HD.  Urine output is  2275 ml in the last 24 hours. Ultrasound of the kidneys did not show any hydronephrosis or obstructive pathology Creatinine around 3.56 today.  No HD asper  nephrology.    Acute metabolic encephalopathy Improving , patient is alert answering questions appropriately. Initial CT of the head is negative for acute intracranial abnormalities. TSH, vitamin B12 normal, ammonia level within normal limits   Type 2 diabetes mellitus Insulin dependent at home.  On SSI here.  CBG (last 3)  Recent Labs    08/25/20 0344 08/25/20 0724 08/25/20 1146  GLUCAP 133* 188* 209*    Hemoglobin A1c is around 8.4. Poor oral intake. Holding lantus at this time.    COVID 19 virus infection.  Completed the course of remdesivir, continue with flutter valve and incentive spirometer and inhalers as needed. Therapy evaluations recommending SNF. He is off isolation today.    Hypokalemia replaced.    Hypertension:  Well controlled.   Hyperlipidemia   Multiple myeloma Dr. Ennever/oncology on board Bone scan does not show any lytic lesions at this time.   Anemia of chronic disease Transfuse to keep hemoglobin greater than 7   Severe thrombocytopenia Platelets around 122,000 no evidence of bleeding at this time. Discussed with Dr. Marin Olp recommended no platelet transfusion at this time as he is not bleeding.   Pressure injury on the back.  Pressure Injury 08/21/20 Coccyx Right;Left;Mid Stage 2 -  Partial thickness loss of dermis presenting as a shallow open injury with a red, pink wound bed without slough. (Active)  08/21/20 2000  Location: Coccyx  Location Orientation: Right;Left;Mid  Staging: Stage 2 -  Partial thickness loss of dermis presenting as a shallow open injury with a red, pink wound bed  without slough.  Wound Description (Comments):   Present on Admission:      Pressure Injury 08/24/20 Coccyx Medial Stage 2 -  Partial thickness loss of dermis presenting as a shallow open injury with a red, pink wound bed without slough. pink open (Active)  08/24/20 0910  Location: Coccyx  Location Orientation: Medial  Staging: Stage 2 -  Partial  thickness loss of dermis presenting as a shallow open injury with a red, pink wound bed without slough.  Wound Description (Comments): pink open  Present on Admission: No   Wound care  will be consulted.    Failure to thrive Dietary will be consulted.   In view of multiple comorbidities, clinical deterioration, hemodialysis dependent, AKI, poor progression palliative care consulted for goals of care discussion.    DVT prophylaxis: heparin.  Code Status: Full code.  Family Communication: None at bedside. Discussed with son on 08/24/20. Disposition:   Status is: Inpatient  Remains inpatient appropriate because:Ongoing diagnostic testing needed not appropriate for outpatient work up and Unsafe d/c plan  Dispo: The patient is from: Home              Anticipated d/c is to:  pending              Patient currently is not medically stable to d/c.   Difficult to place patient No       Consultants:  None.   Procedures: none.   Antimicrobials:  Antibiotics Given (last 72 hours)     None         Subjective: No chest pain or sob. No new complaints.   Objective: Vitals:   08/25/20 0345 08/25/20 0346 08/25/20 0728 08/25/20 1147  BP: 130/68  135/69 (!) 111/58  Pulse: 91  96 95  Resp: _0 Temp: 98 F (36.7 C)  97.8 F (36.6 C) 97.7 F (36.5 C)  TempSrc:   Oral Oral  SpO2: 98%  97% 97%  Weight:  71.6 kg    Height:        Intake/Output Summary (Last 24 hours) at 08/25/2020 1338 Last data filed at 08/25/2020 1151 Gross per 24 hour  Intake 1009.17 ml  Output 3175 ml  Net -2165.83 ml    Filed Weights   08/23/20 1945 08/24/20 0355 08/25/20 0346  Weight: 73.6 kg 73.6 kg 71.6 kg    Examination:  General exam: Ill appearing gentleman, not in distress on RA.  Respiratory system: Clear to ausculation, no wheezing on RA.  Cardiovascular system: S1 & S2 heard, rrr, NO JVD, no pedal edema.  Gastrointestinal system: Abdomen is soft, NT ND BS+ Central nervous  system: Alert and answering questions appropriately.  Extremities: no pedal edema.  Skin: stage 2 coccyx pressure injury.  Psychiatry: cannot be assessed.     Data Reviewed: I have personally reviewed following labs and imaging studies  CBC: Recent Labs  Lab 08/19/20 0346 08/20/20 0000 08/20/20 0500 08/21/20 0530 08/21/20 0731 08/22/20 0533 08/22/20 1603 08/23/20 0550 08/24/20 0440 08/25/20 0105  WBC 1.7* 2.3* 2.6* 4.6  --  5.7  --  5.3 5.9 4.8  NEUTROABS 0.8* 1.2* 1.5* 3.5  --   --   --   --   --   --   HGB 7.5* 7.9* 8.1* 7.9*  --  7.0* 7.7* 7.6* 7.4* 8.1*  HCT 22.4* 23.2* 23.5* 23.9*   < > 21.7* 23.5* 22.3* 21.7* 23.3*  MCV 93.3 91.0 91.4 94.1  --  96.9  --  96.5 95.2 95.9  PLT 19* 35* 33* 24*  --  22*  --  19* 17* 22*   < > = values in this interval not displayed.     Basic Metabolic Panel: Recent Labs  Lab 08/21/20 0530 08/21/20 1555 08/22/20 0533 08/22/20 1809 08/23/20 0550 08/23/20 1612 08/24/20 0440 08/25/20 0105  NA 135 135 136 135 133* 131* 131* 131*  K 3.4* 4.0 3.5 3.7 3.5 3.5 3.4* 3.5  CL 101 104 102 103 103 101 102 99  CO2 _0 21*  GLUCOSE 182* 138* 154* 109* 151* 165* 128* 122*  BUN 33* 25* 35* 31* 42* 52* 59* 63*  CREATININE 2.15* 1.60* 2.28* 2.14* 2.60* 2.91* 3.16* 3.56*  CALCIUM 7.3* 7.1* 7.1* 7.1* 6.8* 6.8* 6.5* 6.9*  MG 2.2  --  2.4  --  2.0  --  2.0 1.7  PHOS 1.9* 2.1* 2.7 2.5 2.3* 2.1*  --   --      GFR: Estimated Creatinine Clearance: 17.6 mL/min (A) (by C-G formula based on SCr of 3.56 mg/dL (H)).  Liver Function Tests: Recent Labs  Lab 08/19/20 0346 08/19/20 1629 08/20/20 0500 08/20/20 1857 08/21/20 0530 08/21/20 1555 08/22/20 0533 08/22/20 1809 08/23/20 0550 08/23/20 1612  AST 16  --  18  --  18  --  13*  --   --   --   ALT 14  --  13  --  12  --  11  --   --   --   ALKPHOS 38  --  35*  --  39  --  37*  --   --   --   BILITOT 0.6  --  0.8  --  0.6  --  0.7  --   --   --   PROT 5.1*  --  5.3*  --  5.8*   --  5.5*  --   --   --   ALBUMIN 1.8*   < > 1.9*  1.9*   < > 1.9*  1.9* 2.0* 2.0*  1.9* 1.7* 1.6* 1.7*   < > = values in this interval not displayed.     CBG: Recent Labs  Lab 08/24/20 2022 08/25/20 0000 08/25/20 0344 08/25/20 0724 08/25/20 1146  GLUCAP 221* 143* 133* 188* 209*      Recent Results (from the past 240 hour(s))  MRSA PCR Screening     Status: None   Collection Time: 08/19/20  5:53 PM  Result Value Ref Range Status   MRSA by PCR NEGATIVE NEGATIVE Final    Comment:        The GeneXpert MRSA Assay (FDA approved for NASAL specimens only), is one component of a comprehensive MRSA colonization surveillance program. It is not intended to diagnose MRSA infection nor to guide or monitor treatment for MRSA infections. Performed at [facility], [place] [address] [place], [place] [address]   SARS CORONAVIRUS 2 (TAT 6-24 HRS) Nasopharyngeal Nasopharyngeal Swab     Status: Abnormal   Collection Time: 08/24/20  5:16 PM   Specimen: Nasopharyngeal Swab  Result Value Ref Range Status   SARS Coronavirus 2 POSITIVE (A) NEGATIVE Final    Comment: (NOTE) SARS-CoV-2 target nucleic acids are DETECTED.  The SARS-CoV-2 RNA is generally detectable in upper and lower respiratory specimens during the acute phase of infection. Positive results are indicative of the presence of SARS-CoV-2 RNA. Clinical correlation with patient history and other diagnostic information is  necessary  to determine patient infection status. Positive results do not rule out bacterial infection or co-infection with other viruses.  The expected result is Negative.  Fact Sheet for Patients: SugarRoll.be  Fact Sheet for Healthcare Providers: https://www.woods-mathews.com/  This test is not yet approved or cleared by the [place] FDA and  has been authorized for detection and/or diagnosis of SARS-CoV-2 by FDA under an Emergency Use  Authorization (EUA). This EUA will remain  in effect (meaning this test can be used) for the duration of the COVID-19 declaration under Section 564(b)(1) of the Act, 21 U. S.C. section 360bbb-3(b)(1), unless the authorization is terminated or revoked sooner.   Performed at [facility], [place] [address], [place], [place] [address]           Radiology Studies: No results found.      Scheduled Meds:  calcium carbonate  1,000 mg of elemental calcium Per Tube BID WC   chlorhexidine  15 mL Mouth Rinse BID   Chlorhexidine Gluconate Cloth  6 each Topical Q0600   darbepoetin (ARANESP) injection - DIALYSIS  60 mcg Intravenous Q Mon-HD   feeding supplement (PROSource TF)  90 mL Per Tube BID   finasteride  5 mg Oral Daily   free water  50 mL Per Tube 6 X Daily   insulin aspart  0-15 Units Subcutaneous Q4H   Ipratropium-Albuterol  1 puff Inhalation Q6H   lidocaine  1 patch Transdermal Q24H   magic mouthwash  10 mL Oral TID   mouth rinse  15 mL Mouth Rinse q12n4p   metoCLOPramide (REGLAN) injection  5 mg Intravenous Q8H   tamsulosin  0.4 mg Oral QHS   Continuous Infusions:  sodium chloride Stopped (08/22/20 1544)   feeding supplement (OSMOLITE 1.5 CAL) 1,000 mL (08/25/20 1217)   promethazine (PHENERGAN) injection (IM or IVPB) Stopped (08/21/20 0559)     LOS: 14 days       Hosie Poisson, MD [facility]   To contact the attending provider between 7A-7P or the covering provider during after hours 7P-7A, please log into the web site [email or web address] and access using universal [place] password for that web site. If you do not have the password, please call the hospital operator.  08/25/2020, 1:38 PM

## 2020-08-25 NOTE — NC FL2 (Signed)
[place] MEDICAID FL2 LEVEL OF CARE SCREENING TOOL     IDENTIFICATION  Patient Name: Jeff Rodriguez Birthdate: 03/15/1945 Sex: male Admission Date (Current Location): 08/11/2020  [facility] and [place] Number:  Herbalist and Address:  [facility], [place] [address], [place], [place] [address]      Provider Number: [id number]  Attending Physician Name and Address:  Hosie Poisson, MD  Relative Name and Phone Number:  Doyal, Saric)   [phone]    Current Level of Care: Hospital Recommended Level of Care: [place] Prior Approval Number:    Date Approved/Denied:   PASRR Number: [id number]  Discharge Plan: SNF    Current Diagnoses: Patient Active Problem List   Diagnosis Date Noted   Malnutrition of moderate degree 08/24/2020   Pressure injury of skin 08/23/2020   Failure to thrive in adult 08/15/2020   Multiple myeloma ([place]) 08/12/2020   Acute renal failure superimposed on stage 3b chronic kidney disease ([place]) 08/11/2020   COVID-19 virus infection 08/11/2020   Hypokalemia 08/11/2020   Pancytopenia ([place]) 08/11/2020   Cholecystitis, acute s/p lap chole 04/23/15 04/24/2015   Chest pain 04/20/2015   Chest wall pain 04/20/2015   DM2 (diabetes mellitus, type 2) ([place]) 04/20/2015   Hyperlipidemia 04/20/2015   BPH (benign prostatic hyperplasia) 04/20/2015   Diabetes mellitus with complication (HCC)    Essential hypertension     Orientation RESPIRATION BLADDER Height & Weight     Self, Situation, Place  Normal Incontinent, External catheter Weight: 157 lb 13.6 oz (71.6 kg) Height:  $Remove'5\' 8"'oLxGYqS$  (172.7 cm)  BEHAVIORAL SYMPTOMS/MOOD NEUROLOGICAL BOWEL NUTRITION STATUS   (none)  (none) Incontinent Diet (Please see DC Summary)  AMBULATORY STATUS COMMUNICATION OF NEEDS Skin   Extensive Assist Verbally PU Stage and Appropriate Care (Stage II on coccyx with foam dressing)                       Personal Care Assistance  Level of Assistance  Bathing, Feeding, Dressing Bathing Assistance: Maximum assistance Feeding assistance: Limited assistance Dressing Assistance: Maximum assistance     Functional Limitations Info  Sight, Hearing, Speech Sight Info: Adequate Hearing Info: Adequate Speech Info: Adequate    SPECIAL CARE FACTORS FREQUENCY  PT (By licensed PT), OT (By licensed OT)     PT Frequency: 5X/W OT Frequency: 5X/W            Contractures Contractures Info: Not present    Additional Factors Info  Code Status, Allergies Code Status Info: full Allergies Info: Shrimp (Shellfish Allergy)   Iodine   Sulfa Antibiotics   Cephalexin   Gabapentin   Oxacillin           Current Medications (08/25/2020):  This is the current hospital active medication list Current Facility-Administered Medications  Medication Dose Route Frequency Provider Last Rate Last Admin   0.9 %  sodium chloride infusion   Intravenous PRN Julian Hy, DO   Stopped at 08/22/20 1544   acetaminophen (TYLENOL) tablet 650 mg  650 mg Oral Q6H PRN Lorella Nimrod, MD   650 mg at 08/23/20 1723   albuterol (VENTOLIN HFA) 108 (90 Base) MCG/ACT inhaler 2 puff  2 puff Inhalation Q6H PRN Lorella Nimrod, MD       alum & mag hydroxide-simeth (MAALOX/MYLANTA) 200-200-20 MG/5ML suspension 30 mL  30 mL Oral Q4H PRN Lorella Nimrod, MD   30 mL at 08/16/20 1232   calcium carbonate (OS-CAL - dosed in  mg of elemental calcium) tablet 1,000 mg of elemental calcium  1,000 mg of elemental calcium Per Tube BID WC Lorella Nimrod, MD   1,000 mg of elemental calcium at 08/25/20 0801   chlorhexidine (PERIDEX) 0.12 % solution 15 mL  15 mL Mouth Rinse BID Roney Jaffe, MD   15 mL at 08/24/20 2247   Chlorhexidine Gluconate Cloth 2 % PADS 6 each  6 each Topical Q0600 Roney Jaffe, MD   6 each at 08/25/20 0605   chlorpheniramine-HYDROcodone (TUSSIONEX) 10-8 MG/5ML suspension 5 mL  5 mL Oral Q12H PRN Lorella Nimrod, MD   5 mL at 08/16/20 1432   Darbepoetin Alfa  (ARANESP) injection 60 mcg  60 mcg Intravenous Q Mon-HD Polly Cobia, RPH   60 mcg at 08/24/20 1546   feeding supplement (OSMOLITE 1.5 CAL) liquid 1,000 mL  1,000 mL Per Tube Continuous Hosie Poisson, MD 50 mL/hr at 08/25/20 1217 1,000 mL at 08/25/20 1217   feeding supplement (PROSource TF) liquid 90 mL  90 mL Per Tube BID Amin, Ankit Chirag, MD   90 mL at 08/25/20 0802   finasteride (PROSCAR) tablet 5 mg  5 mg Oral Daily Lorella Nimrod, MD   5 mg at 08/25/20 0801   free water 50 mL  50 mL Per Tube 6 X Daily Lorella Nimrod, MD   50 mL at 08/25/20 0817   guaiFENesin-dextromethorphan (ROBITUSSIN DM) 100-10 MG/5ML syrup 10 mL  10 mL Oral Q4H PRN Lorella Nimrod, MD   10 mL at 08/23/20 2050   insulin aspart (novoLOG) injection 0-15 Units  0-15 Units Subcutaneous Q4H Irene Pap N, DO   5 Units at 08/25/20 1152   Ipratropium-Albuterol (COMBIVENT) respimat 1 puff  1 puff Inhalation Q6H Amin, Ankit Chirag, MD   1 puff at 08/24/20 2025   lidocaine (LIDODERM) 5 % 1 patch  1 patch Transdermal Q24H Lorella Nimrod, MD   1 patch at 08/20/20 1538   magic mouthwash  10 mL Oral TID Lorella Nimrod, MD   10 mL at 08/24/20 2249   MEDLINE mouth rinse  15 mL Mouth Rinse q12n4p Roney Jaffe, MD   15 mL at 08/24/20 1558   metoCLOPramide (REGLAN) injection 5 mg  5 mg Intravenous Q8H Amin, Ankit Chirag, MD   5 mg at 08/25/20 0703   ondansetron (ZOFRAN) tablet 4 mg  4 mg Oral Q6H PRN Lorella Nimrod, MD       Or   ondansetron (ZOFRAN) injection 4 mg  4 mg Intravenous Q6H PRN Lorella Nimrod, MD   4 mg at 08/21/20 0401   phenol (CHLORASEPTIC) mouth spray 1 spray  1 spray Mouth/Throat PRN Lorella Nimrod, MD   1 spray at 08/16/20 2207   promethazine (PHENERGAN) 12.5 mg in sodium chloride 0.9 % 50 mL IVPB  12.5 mg Intravenous Q6H PRN Lorella Nimrod, MD   Stopped at 08/21/20 0559   tamsulosin (FLOMAX) capsule 0.4 mg  0.4 mg Oral QHS Lorella Nimrod, MD   0.4 mg at 08/24/20 2248     Discharge Medications: Please see discharge  summary for a list of discharge medications.  Relevant Imaging Results:  Relevant Lab Results:   Additional Information SSN: [id number]. COVID recovered, unvaccinated. Has [place] Medicare #[id number]  Benard Halsted, LCSW

## 2020-08-25 NOTE — Progress Notes (Signed)
  Speech Language Pathology Treatment: Dysphagia  Patient Details Name: Jeff Rodriguez MRN: [id number] DOB: 1945/03/11 Today's Date: 08/25/2020 Time: 3754-3606 SLP Time Calculation (min) (ACUTE ONLY): 11 min  Assessment / Plan / Recommendation Clinical Impression  Pt continues with TF and minimal oral intake due to poor appetite.  During today's f/u he reported no further pain upon swallowing as was described initially.  He demonstrated adequate mastication of regular solid (cookie from his lunch tray) and no s/s of aspiration even when consuming mixed solid/liquid consistencies. There are NO concerns for an oropharyngeal dysphagia at this time. Recommend continuing to allow regular solids and thin liquids; meds with liquids as tolerated. There are no further SLP needs; our service will sign off.    HPI HPI: 75 y.o. male with medical history significant of multiple myeloma, diabetes, hypertension, diabetes, asthma, hyperlipidemia and osteoarthritis who was brought in by EMS secondary to productive cough, poor oral intake and not eating much.  Patient is currently undergoing chemo for his multiple myeloma.  His son actually called EMS.  Patient came to the ER and was not a good historian.  He reports his 4 to 5-day of the cough and congestion.  No fevers no vomiting.  Patient has had some loose stools.  Denies shortness of breath but just weak and poor appetite.  Patient reported recent exposure to both flu and the COVID virus.      SLP Plan  All goals met       Recommendations  Diet recommendations: Regular;Thin liquid Liquids provided via: Cup;Straw Medication Administration: Whole meds with liquid Supervision: Patient able to self feed;Staff to assist with self feeding Postural Changes and/or Swallow Maneuvers: Seated upright 90 degrees                Oral Care Recommendations: Oral care BID Follow up Recommendations: None Plan: All goals met       GO               Jeff Rodriguez, Jeff Rodriguez CCC/SLP Acute Rehabilitation Services Office number [phone] Pager [phone]   Jeff Rodriguez 08/25/2020, 12:52 PM

## 2020-08-25 NOTE — Progress Notes (Signed)
HEMATOLOGY-ONCOLOGY PROGRESS NOTE  SUBJECTIVE: Jeff Rodriguez is more alert today.  Physical therapy is in the room planning to get him out of bed to ambulate.  He currently denies bleeding.  He is not having any fevers or chills.  He has no cough or shortness of breath today.  REVIEW OF SYSTEMS:   Constitutional: Denies fevers, chills  Eyes: Denies blurriness of vision Ears, nose, mouth, throat, and face: Denies mucositis or sore throat Respiratory: Denies cough, dyspnea or wheezes Cardiovascular: Denies palpitation, chest discomfort Gastrointestinal:  Denies nausea, heartburn or change in bowel habits Skin: Denies abnormal skin rashes Lymphatics: Denies new lymphadenopathy or easy bruising Neurological:Denies numbness, tingling or new weaknesses Behavioral/Psych: Mood is stable, no new changes  Extremities: No lower extremity edema All other systems were reviewed with the patient and are negative.  I have reviewed the past medical history, past surgical history, social history and family history with the patient and they are unchanged from previous note.   PHYSICAL EXAMINATION: ECOG PERFORMANCE STATUS: 3 - Symptomatic, >50% confined to bed  Vitals:   08/25/20 0728 08/25/20 1147  BP: 135/69 (!) 111/58  Pulse: 96 95  Resp: 17 20  Temp: 97.8 F (36.6 C) 97.7 F (36.5 C)  SpO2: 97% 97%   Filed Weights   08/23/20 1945 08/24/20 0355 08/25/20 0346  Weight: 73.6 kg 73.6 kg 71.6 kg    Intake/Output from previous day: 07/04 0701 - 07/05 0700 In: 729.2 [NG/GT:729.2] Out: 2275 [Urine:2275]  General: Chronically ill-appearing male, more alert and interactive today, no distressEyes:  no scleral icterus.     Respiratory: lungs were clear bilaterally without wheezing or crackles.   Cardiovascular:  Regular rate and rhythm, S1/S2, without murmur, rub or gallop.  There was no pedal edema.   GI:  abdomen was soft, flat, nontender, nondistended, without organomegaly.   Neuro: Alert and  oriented x3, no focal deficits.  LABORATORY DATA:  I have reviewed the data as listed CMP Latest Ref Rng & Units 08/25/2020 08/24/2020 08/23/2020  Glucose 70 - 99 mg/dL 122(H) 128(H) 165(H)  BUN 8 - 23 mg/dL 63(H) 59(H) 52(H)  Creatinine 0.61 - 1.24 mg/dL 3.56(H) 3.16(H) 2.91(H)  Sodium 135 - 145 mmol/L 131(L) 131(L) 131(L)  Potassium 3.5 - 5.1 mmol/L 3.5 3.4(L) 3.5  Chloride 98 - 111 mmol/L 99 102 101  CO2 22 - 32 mmol/L 21(L) 24 23  Calcium 8.9 - 10.3 mg/dL 6.9(L) 6.5(L) 6.8(L)  Total Protein 6.5 - 8.1 g/dL - - -  Total Bilirubin 0.3 - 1.2 mg/dL - - -  Alkaline Phos 38 - 126 U/L - - -  AST 15 - 41 U/L - - -  ALT 0 - 44 U/L - - -    Lab Results  Component Value Date   WBC 4.8 08/25/2020   HGB 8.1 (L) 08/25/2020   HCT 23.3 (L) 08/25/2020   MCV 95.9 08/25/2020   PLT 22 (LL) 08/25/2020   NEUTROABS 3.5 08/21/2020    DG Abd 1 View  Result Date: 08/21/2020 CLINICAL DATA:  Check feeding catheter placement and abdominal distension EXAM: ABDOMEN - 1 VIEW COMPARISON:  08/19/2020 FINDINGS: Feeding catheter is noted extending towards the proximal portion of the duodenum. Gaseous distension of the stomach is noted. Scattered large and small bowel gas is noted. Changes of prior vertebral augmentation are again noted. No free air is seen. IMPRESSION: Overall stable appearance of the abdomen. Feeding catheter is noted extending into the proximal duodenum. Electronically Signed   By: Elta Guadeloupe  Lukens M.D.   On: 08/21/2020 11:18   DG Abd 1 View  Result Date: 08/19/2020 CLINICAL DATA:  NG tube placement EXAM: ABDOMEN - 1 VIEW COMPARISON:  None. FINDINGS: Feeding tube is in place with the tip in the right upper abdomen. This could be in the distal stomach or proximal duodenum. IMPRESSION: Feeding tube tip in the distal stomach or proximal duodenum. Electronically Signed   By: Rolm Baptise M.D.   On: 08/19/2020 20:15   CT HEAD WO CONTRAST  Result Date: 08/19/2020 CLINICAL DATA:  Mental status change.   COVID-19 positive. EXAM: CT HEAD WITHOUT CONTRAST TECHNIQUE: Contiguous axial images were obtained from the base of the skull through the vertex without intravenous contrast. COMPARISON:  None. FINDINGS: Brain: There is extensive encephalomalacia in the right occipital and posterior right temporal lobes consistent with a chronic PCA infarct with associated dystrophic calcification and ex vacuo dilatation of the right lateral ventricle. There is also encephalomalacia at the right temporoparietal junction (posterior MCA territory/border zone). A small cortical infarct in the left occipital lobe is also felt to be chronic. No acute large territory infarct, intracranial hemorrhage, mass, midline shift, or extra-axial fluid collection is identified. Vascular: Calcified atherosclerosis at the skull base. No hyperdense vessel. Skull: No fracture or suspicious osseous lesion. Sinuses/Orbits: Extensive opacification of the paranasal sinuses bilaterally by combination of mucosal thickening and fluid. Small bilateral mastoid effusions. Bilateral cataract extraction. Other: Partially visualized nasoenteric tube. IMPRESSION: 1. No evidence of acute intracranial abnormality. 2. 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 Electronically Signed   By: Logan Bores M.D.   On: 08/19/2020 18:24   US RENAL  Result Date: 08/12/2020 CLINICAL DATA:  Acute renal failure. EXAM: RENAL / URINARY TRACT ULTRASOUND COMPLETE COMPARISON:  CT 06/04/2010. FINDINGS: Right Kidney: Renal measurements: 11.6 x 5.5 x 5.9 cm = volume: 197.5 mL. Increased echogenicity. No mass or hydronephrosis visualized. Left Kidney: Renal measurements: 11.5 x 6.4 x 5.3 cm = volume: 204.9 mL. Increased echogenicity. No mass or hydronephrosis visualized. Bladder: Appears normal for degree of bladder distention. Other: The prostate is enlarged at 6.1 x 3.9 x 5.3 cm. The prostate is irregular in contour. IMPRESSION: 1. Increased echogenicity both  kidneys consistent chronic medical renal disease. No acute abnormality identified. No hydronephrosis or bladder distention. 2.  The prostate is enlarged and irregular in contour. Electronically Signed   By: Marcello Moores  Register   On: 08/12/2020 07:15   DG CHEST PORT 1 VIEW  Result Date: 08/21/2020 CLINICAL DATA:  COVID-19 positivity with increased cough, initial encounter EXAM: PORTABLE CHEST 1 VIEW COMPARISON:  08/19/2020 FINDINGS: Feeding catheter and right jugular central line are again seen and stable. Postsurgical changes in the cervical spine are noted. Cardiac shadow is stable. Small basilar effusions are noted. No focal infiltrate or pneumothorax is noted. IMPRESSION: Small bilateral pleural effusions. Tubes and lines as described above stable in appearance. Electronically Signed   By: Inez Catalina M.D.   On: 08/21/2020 08:22   DG CHEST PORT 1 VIEW  Result Date: 08/19/2020 CLINICAL DATA:  Central line placement EXAM: PORTABLE CHEST 1 VIEW COMPARISON:  08/11/2020 FINDINGS: Right central line is been placed with the tip in the SVC. No pneumothorax. Heart is normal size. Left basilar atelectasis or infiltrate, new since prior study. No visible effusions. OG tube is seen entering the stomach. The distal aspect of the tube is not visualized. IMPRESSION: Right central line tip in the SVC.  No pneumothorax. New left lower lobe atelectasis  or infiltrate. Electronically Signed   By: Rolm Baptise M.D.   On: 08/19/2020 20:14   DG Chest Port 1 View  Result Date: 08/11/2020 CLINICAL DATA:  Cough. EXAM: PORTABLE CHEST 1 VIEW COMPARISON:  04/20/2015 FINDINGS: The lungs are clear without focal pneumonia, edema, pneumothorax or pleural effusion. The cardiopericardial silhouette is within normal limits for size. Degenerative changes noted in the right shoulder. Telemetry leads overlie the chest. IMPRESSION: No active disease. Electronically Signed   By: Misty Stanley M.D.   On: 08/11/2020 17:08   DG Abd Portable  1V  Result Date: 08/17/2020 CLINICAL DATA:  Feeding tube EXAM: PORTABLE ABDOMEN - 1 VIEW COMPARISON:  04/24/2019 FINDINGS: Esophageal tube tip overlies distal stomach. Surgical clips in the right upper quadrant. Multiple treated compression deformities of the thoracolumbar spine. Mild air distended small bowel in the right abdomen up to 3.5 cm with scattered colon gas and more focally dilated probable colon in the pelvis. IMPRESSION: 1. Esophageal tube tip overlies distal stomach. 2. Mild air distension of small bowel in the right mid abdomen with scattered colon gas, question mild ileus Electronically Signed   By: Donavan Foil M.D.   On: 08/17/2020 14:12   DG Bone Survey Met  Result Date: 08/18/2020 CLINICAL DATA:  History of myeloma. EXAM: METASTATIC BONE SURVEY COMPARISON:  Various previous imaging studies. FINDINGS: Lateral skull: No lytic lesion. Right shoulder: No lytic lesion.  Chronic degenerative changes. Left shoulder: No lytic lesion.  Chronic degenerative changes. Left humerus: No lytic lesion. Right humerus: No lytic lesion. Right forearm: No lytic lesion. Left forearm: No lytic lesion. Cervical spine: Previous anterior and posterior fusion. No evidence of active lytic lesion. Thoracic spine two views: Distant augmentation at T10 and T12. No lytic lesion or fracture seen otherwise. Lumbar spine two views: Previous augmentation at L1, L2, L3 and L5. No sign of lytic lesion. No progressive collapse. AP pelvis one view: No lytic lesion. Previous hip replacement on the right. Left hip: No lytic lesion Left femur: No lytic lesion.  Previous knee replacement. Right femur: No lytic lesion. Previous hip replacement and knee replacement. Right hip: No lytic lesion.  Previous hip replacement. Right tib fib: No lytic lesion. Left hip fib: No lytic lesion. AP chest one view: Soft feeding tube enters the abdomen. Atelectasis or infiltrate in both lung bases, new since the study 1 week ago. No lytic lesions  seen in the ribcage. IMPRESSION: No skeletal lytic lesions. Multiple augmented thoracic and lumbar fractures. No new or progressive fractures. Newly seen bilateral lower lobe atelectasis and or pneumonia. Electronically Signed   By: Nelson Chimes M.D.   On: 08/18/2020 15:14    ASSESSMENT AND PLAN: 1.  Multiple myeloma currently followed at the [facility] 2.  AKI on CKD-?  Secondary to ACE versus myeloma versus COVID related 3.  COVID-19 infection 4.  Pancytopenia due to recent chemotherapy 5.  Hypocalcemia-received recent bisphosphonate therapy 6.  Hypertension 7.  Diabetes mellitus 8.  Hyperlipidemia  -Creatinine up slightly today but with improved urine output.  Nephrology following.  Dialysis currently on hold. -CBC from this morning has been reviewed.  WBC is normal.  His hemoglobin is improved somewhat today to 8.1 (last transfusion 7/2).  Platelet count improved to 22,000 today (last platelet transfusion 6/29).  He is not actively bleeding. -Recommend PRBC transfusion for hemoglobin less than 7 or transfuse platelets for platelet count less than 10,000 or active bleeding. -Light chains obtained 6/28 do show an elevated kappa free light  chain and lambda free light chain, but kappa, lambda light chain ratio remains normal.  Unlikely myeloma is the cause for his renal failure. -The patient received a dose of Procrit 40,000 units on 6/30.  Additionally, he is receiving Aranesp 60 mcg weekly per nephrology, last dose given 08/24/2020.   LOS: 14 days   Mikey Bussing, DNP, AGPCNP-BC, AOCNP 08/25/20

## 2020-08-25 NOTE — Progress Notes (Signed)
Subjective:  no acute events overnight. No new complaints. Patient reports that his appetite is better today. Feels like he can eat. Denies any n/v, brain fog, hiccup, pruritis, dysgeusia. Uop better, 2.2L.    Objective Vital signs in last 24 hours: Vitals:   08/25/20 0345 08/25/20 0346 08/25/20 0728 08/25/20 1147  BP: 130/68  135/69 (!) 111/58  Pulse: 91  96 95  Resp: _0 Temp: 98 F (36.7 C)  97.8 F (36.6 C) 97.7 F (36.5 C)  TempSrc:   Oral Oral  SpO2: 98%  97% 97%  Weight:  71.6 kg    Height:       Weight change: -2 kg  Intake/Output Summary (Last 24 hours) at 08/25/2020 1210 Last data filed at 08/25/2020 1151 Gross per 24 hour  Intake 1009.17 ml  Output 3175 ml  Net -2165.83 ml   Physical Exam: Gen: awake but lethargic HEENT: anicteric sclera Chest: s1s2, rrr Resp: cta bl Abd: soft, nt/nd Msk; no edema Neuro: awake but lethargic, speech clear and coherent, no tremors     Labs: Basic Metabolic Panel: Recent Labs  Lab 08/22/20 1809 08/23/20 0550 08/23/20 1612 08/24/20 0440 08/25/20 0105  NA 135 133* 131* 131* 131*  K 3.7 3.5 3.5 3.4* 3.5  CL 103 103 101 102 99  CO2 _1 21*  GLUCOSE 109* 151* 165* 128* 122*  BUN 31* 42* 52* 59* 63*  CREATININE 2.14* 2.60* 2.91* 3.16* 3.56*  CALCIUM 7.1* 6.8* 6.8* 6.5* 6.9*  PHOS 2.5 2.3* 2.1*  --   --    Liver Function Tests: Recent Labs  Lab 08/20/20 0500 08/20/20 1857 08/21/20 0530 08/21/20 1555 08/22/20 0533 08/22/20 1809 08/23/20 0550 08/23/20 1612  AST 18  --  18  --  13*  --   --   --   ALT 13  --  12  --  11  --   --   --   ALKPHOS 35*  --  39  --  37*  --   --   --   BILITOT 0.8  --  0.6  --  0.7  --   --   --   PROT 5.3*  --  5.8*  --  5.5*  --   --   --   ALBUMIN 1.9*  1.9*   < > 1.9*  1.9*   < > 2.0*  1.9* 1.7* 1.6* 1.7*   < > = values in this interval not displayed.   No results for input(s): LIPASE, AMYLASE in the last 168 hours. Recent Labs  Lab 08/22/20 1206  AMMONIA 13    CBC: Recent Labs  Lab 08/20/20 0000 08/20/20 0500 08/21/20 0530 08/21/20 0731 08/22/20 0533 08/22/20 1603 08/23/20 0550 08/24/20 0440 08/25/20 0105  WBC 2.3* 2.6* 4.6  --  5.7  --  5.3 5.9 4.8  NEUTROABS 1.2* 1.5* 3.5  --   --   --   --   --   --   HGB 7.9* 8.1* 7.9*  --  7.0*   < > 7.6* 7.4* 8.1*  HCT 23.2* 23.5* 23.9*   < > 21.7*   < > 22.3* 21.7* 23.3*  MCV 91.0 91.4 94.1  --  96.9  --  96.5 95.2 95.9  PLT 35* 33* 24*  --  22*  --  19* 17* 22*   < > = values in this interval not displayed.   Cardiac Enzymes: No results for input(s): CKTOTAL, CKMB,  CKMBINDEX, TROPONINI in the last 168 hours. CBG: Recent Labs  Lab 08/24/20 2022 08/25/20 0000 08/25/20 0344 08/25/20 0724 08/25/20 1146  GLUCAP 221* 143* 133* 188* 209*    Iron Studies:  No results for input(s): IRON, TIBC, TRANSFERRIN, FERRITIN in the last 72 hours.  Studies/Results: No results found. Medications: Infusions:  sodium chloride Stopped (08/22/20 1544)   feeding supplement (OSMOLITE 1.5 CAL) 1,000 mL (08/24/20 2024)   promethazine (PHENERGAN) injection (IM or IVPB) Stopped (08/21/20 0559)    Scheduled Medications:  calcium carbonate  1,000 mg of elemental calcium Per Tube BID WC   chlorhexidine  15 mL Mouth Rinse BID   Chlorhexidine Gluconate Cloth  6 each Topical Q0600   darbepoetin (ARANESP) injection - DIALYSIS  60 mcg Intravenous Q Mon-HD   feeding supplement (PROSource TF)  90 mL Per Tube BID   finasteride  5 mg Oral Daily   free water  50 mL Per Tube 6 X Daily   insulin aspart  0-15 Units Subcutaneous Q4H   Ipratropium-Albuterol  1 puff Inhalation Q6H   lidocaine  1 patch Transdermal Q24H   magic mouthwash  10 mL Oral TID   mouth rinse  15 mL Mouth Rinse q12n4p   metoCLOPramide (REGLAN) injection  5 mg Intravenous Q8H   tamsulosin  0.4 mg Oral QHS    have reviewed scheduled and prn medications.      Assessment/ Plan: Pt is a 75 y.o. yo male with multiple myeloma who was admitted  on 08/11/2020 with COVID-  A on CRT-  crt 1.7 in Jan 2022-  now 7- non oliguric   1. A on CRF-  baseline creat 1.7 in Jan 2022. Renal u/s neg for obstruction. UA unrevealing.  Suspect ATN from hypotension/ ACEi.  Myeloma kidney unlikely- per ONC w/u current myeloma testing is negative. Initially pt was refusing dialysis, but then changed his mind. Due to decline in responsiveness and persistent azotemia (creat in 7's), pt was started on CRRT 6/29. Pt became more alert and responsive w/ CRRT , however he has remained very unmotivated. CRRT dc'd on 7/02 when pt was transferred to [facility].  Waiting for kidney function to settle out since he is been off of renal replacement therapy as of 7/2, Cr is up today (3.6 from 3.2) however has made urine output. He may very well recover his function soon but too early to tell.. Will monitor for further renal replacement therapy needs but no urgent indications present at the moment. Maintain dialysis catheter 2. HTN/volume-  no vol excess on exam today 3. Anemia-  blood transfusion 6/23, Hb in 7-8 range, ordered darbe 60 ug weekly IV to start Monday/today 4. Hypocalcemia-    no sxms - no action needed -  got bolus during hosp. Monitor albumin or ionized cal 5. Hx multiple myeloma - no evidence here of active disease, see ONC notes 6. Pancytopenia - per Hem-Onc 7.  COVID 19 infection - sp course of remdesivir 8. Hyponatremia, mild: likely related to AKI, stable  Gean Quint, MD [facility] 08/24/2020, 11:09 AM

## 2020-08-26 ENCOUNTER — Inpatient Hospital Stay (HOSPITAL_COMMUNITY): Payer: No Typology Code available for payment source

## 2020-08-26 DIAGNOSIS — N179 Acute kidney failure, unspecified: Secondary | ICD-10-CM | POA: Diagnosis not present

## 2020-08-26 DIAGNOSIS — N1832 Chronic kidney disease, stage 3b: Secondary | ICD-10-CM

## 2020-08-26 LAB — MAGNESIUM: Magnesium: 1.6 mg/dL — ABNORMAL LOW (ref 1.7–2.4)

## 2020-08-26 LAB — CBC
HCT: 22.5 % — ABNORMAL LOW (ref 39.0–52.0)
Hemoglobin: 8 g/dL — ABNORMAL LOW (ref 13.0–17.0)
MCH: 33.5 pg (ref 26.0–34.0)
MCHC: 35.6 g/dL (ref 30.0–36.0)
MCV: 94.1 fL (ref 80.0–100.0)
Platelets: 24 10*3/uL — CL (ref 150–400)
RBC: 2.39 MIL/uL — ABNORMAL LOW (ref 4.22–5.81)
RDW: 15.5 % (ref 11.5–15.5)
WBC: 4.2 10*3/uL (ref 4.0–10.5)
nRBC: 0 % (ref 0.0–0.2)

## 2020-08-26 LAB — BASIC METABOLIC PANEL
Anion gap: 9 (ref 5–15)
BUN: 71 mg/dL — ABNORMAL HIGH (ref 8–23)
CO2: 21 mmol/L — ABNORMAL LOW (ref 22–32)
Calcium: 7 mg/dL — ABNORMAL LOW (ref 8.9–10.3)
Chloride: 99 mmol/L (ref 98–111)
Creatinine, Ser: 3.91 mg/dL — ABNORMAL HIGH (ref 0.61–1.24)
GFR, Estimated: 15 mL/min — ABNORMAL LOW (ref >60)
Glucose, Bld: 158 mg/dL — ABNORMAL HIGH (ref 70–99)
Potassium: 3.3 mmol/L — ABNORMAL LOW (ref 3.5–5.1)
Sodium: 129 mmol/L — ABNORMAL LOW (ref 135–145)

## 2020-08-26 LAB — GLUCOSE, CAPILLARY
Glucose-Capillary: 103 mg/dL — ABNORMAL HIGH (ref 70–99)
Glucose-Capillary: 116 mg/dL — ABNORMAL HIGH (ref 70–99)
Glucose-Capillary: 133 mg/dL — ABNORMAL HIGH (ref 70–99)
Glucose-Capillary: 158 mg/dL — ABNORMAL HIGH (ref 70–99)
Glucose-Capillary: 171 mg/dL — ABNORMAL HIGH (ref 70–99)
Glucose-Capillary: 185 mg/dL — ABNORMAL HIGH (ref 70–99)

## 2020-08-26 MED ORDER — POTASSIUM CHLORIDE CRYS ER 20 MEQ PO TBCR
40.0000 meq | EXTENDED_RELEASE_TABLET | Freq: Once | ORAL | Status: AC
  Administered 2020-08-26: 40 meq via ORAL
  Filled 2020-08-26: qty 2

## 2020-08-26 MED ORDER — POTASSIUM CHLORIDE CRYS ER 20 MEQ PO TBCR
40.0000 meq | EXTENDED_RELEASE_TABLET | Freq: Once | ORAL | Status: DC
  Filled 2020-08-26: qty 2

## 2020-08-26 MED ORDER — OSMOLITE 1.5 CAL PO LIQD
1000.0000 mL | ORAL | Status: DC
  Filled 2020-08-26: qty 1000

## 2020-08-26 MED ORDER — FREE WATER: 50.0000 mL | Freq: Every day | Status: DC

## 2020-08-26 MED ORDER — SODIUM CHLORIDE 0.9 % IV SOLN: INTRAVENOUS | Status: DC

## 2020-08-26 MED ORDER — MAGNESIUM SULFATE 2 GM/50ML IV SOLN
2.0000 g | Freq: Once | INTRAVENOUS | Status: AC
  Administered 2020-08-26: 2 g via INTRAVENOUS
  Filled 2020-08-26: qty 50

## 2020-08-26 NOTE — Progress Notes (Signed)
When administering Klor-con via cortrak, cortrak was not in place because medication came back up thru nose. Cortrak has been dislogded. Pt and pt's son at bedside stated that pt had coughed earlier and tube had been pulled. Dr. Candiss Norse made aware and stated it is okay to leave out cortrak for now.

## 2020-08-26 NOTE — Plan of Care (Signed)
  Problem: Clinical Measurements: Goal: Respiratory complications will improve Outcome: Progressing   Problem: Activity: Goal: Risk for activity intolerance will decrease Outcome: Progressing   Problem: Nutrition: Goal: Adequate nutrition will be maintained Outcome: Progressing   Problem: Elimination: Goal: Will not experience complications related to bowel motility Outcome: Progressing Goal: Will not experience complications related to urinary retention Outcome: Progressing   Problem: Pain Managment: Goal: General experience of comfort will improve Outcome: Progressing   Problem: Safety: Goal: Ability to remain free from injury will improve Outcome: Progressing   Problem: Skin Integrity: Goal: Risk for impaired skin integrity will decrease Outcome: Progressing   Problem: Respiratory: Goal: Will maintain a patent airway Outcome: Progressing Goal: Complications related to the disease process, condition or treatment will be avoided or minimized Outcome: Progressing

## 2020-08-26 NOTE — Progress Notes (Signed)
PROGRESS NOTE    Jeff Rodriguez  YKD:[id number] DOB: [birth year] DOA: 08/11/2020 PCP: [facility]    Chief Complaint  Patient presents with   Cough    Brief Narrative:  75 y.o. male with medical history significant of multiple myeloma (on [name]), diabetes, hypertension, asthma, hyperlipidemia and osteoarthritis who was brought in by EMS secondary to productive cough, poor oral intake and not eating much.  Patient is currently undergoing chemo for his multiple myeloma. Tested positive for COVID 19, found to be hypokalemic and  in acute renal failure.  Nephrology team following.  in view of severe thrombocytopenia, oncology consulted and neupogen given. Bone scan 08/18/2020 negative for lytic lesions.  Due to poor prognosis, palliative care team consulted and patient decided for CRRT.  Per nephrology patient was transferred to [facility] for HD.   He has received 2 units of prbc transfusion so far.  Pt seen and examined at bedside, no new complaints.    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 AKI probably secondary to dehydration , ATN from hypotension and ACE. Due to decline in responsiveness and AKI, he was initially started on CRRT, on 6/29 with improvement in the mental status and renal parameters.  He has a right IJ dialysis catheter.  Currently holding of HD, nephrology following.  Monitor renal function.  Urine output has been good.    Multiple myeloma - Dr. Ennever/oncology on board. Bone scan does not show any lytic lesions at this time.   Anemia of chronic disease - Transfuse to keep hemoglobin greater than 7  Severe thrombocytopenia - No evidence of bleeding at this time.Previous [facility] Oncologist Dr. Marin Olp recommended no platelet transfusion at this time as he is not bleeding.  Had oncology follow-up.  Will defer any transfusions to oncology   Acute metabolic  encephalopathy - Improving , patient is alert answering questions appropriately. Initial CT of the head is negative for acute intracranial abnormalities.TSH, vitamin B12 normal, ammonia level within normal limits, this problem is almost completely resolved.   COVID 19 virus infection. Completed the course of remdesivir, continue with flutter valve and incentive spirometer and inhalers as needed. Therapy evaluations recommending SNF. He is off isolation.   Hypokalemia and hypomagnesemia.  Both replaced.  Hypertension: Well controlled.   Hyperlipidemia -resume home dose statin once acute issues better.  Severe PCM.  He is on NG tube feeds, oral intake has improved, will hold NG tube feeds on 08/26/2020 and encourage oral diet and monitor  Type 2 diabetes mellitus -  Continue present insulin, outpatient glycemic control poor due to hyperglycemia with A1c of 8.4.  CBG (last 3)  Recent Labs    08/26/20 0337 08/26/20 0736 08/26/20 1153  GLUCAP 158* 185* 116*      Pressure injury on the back.  Pressure Injury 08/21/20 Coccyx Right;Left;Mid Stage 2 -  Partial thickness loss of dermis presenting as a shallow open injury with a red, pink wound bed without slough. (Active)  08/21/20 2000  Location: Coccyx  Location Orientation: Right;Left;Mid  Staging: Stage 2 -  Partial thickness loss of dermis presenting as a shallow open injury with a red, pink wound bed without slough.  Wound Description (Comments):   Present on Admission:      Pressure Injury 08/24/20 Coccyx Medial Stage 2 -  Partial thickness loss of dermis presenting as a shallow open injury with a red, pink  wound bed without slough. pink open (Active)  08/24/20 0910  Location: Coccyx  Location Orientation: Medial  Staging: Stage 2 -  Partial thickness loss of dermis presenting as a shallow open injury with a red, pink wound bed without slough.  Wound Description (Comments): pink open  Present on Admission: No   Wound care  will  be consulted.    Failure to thrive Dietary will be consulted.   In view of multiple comorbidities, clinical deterioration, hemodialysis dependent, AKI, poor progression palliative care consulted for goals of care discussion.    DVT prophylaxis: SCDs Code Status: Full code.  Family Communication: None at bedside. Discussed with son on 08/24/20. Disposition:   Status is: Inpatient  Remains inpatient appropriate because:Ongoing diagnostic testing needed not appropriate for outpatient work up and Unsafe d/c plan  Dispo: The patient is from: Home              Anticipated d/c is to:  pending              Patient currently is not medically stable to d/c.   Difficult to place patient No   Consultants:  Renal, Oncology, Pall Care  Procedures: none.   Antimicrobials:  Antibiotics Given (last 72 hours)     None         Subjective: No chest pain or sob. No new complaints.   Objective: Vitals:   08/26/20 0000 08/26/20 0338 08/26/20 0737 08/26/20 1150  BP: 132/65 133/65 135/70 (!) 120/57  Pulse: 96 99 98 98  Resp: (!) $RemoveB'21 17 20 20  'rFSuSCRq$ Temp:  98 F (36.7 C) 98.1 F (36.7 C) 97.7 F (36.5 C)  TempSrc:  Oral Oral Oral  SpO2: 97% 98% 98% 96%  Weight:  71 kg    Height:        Intake/Output Summary (Last 24 hours) at 08/26/2020 1226 Last data filed at 08/26/2020 1140 Gross per 24 hour  Intake 1688.33 ml  Output 2350 ml  Net -661.67 ml   Filed Weights   08/24/20 0355 08/25/20 0346 08/26/20 0338  Weight: 73.6 kg 71.6 kg 71 kg    Examination:  Awake Alert, No new F.N deficits, Normal affect, NG in place, R.IJ HD Cath [place].AT,PERRAL Supple Neck,No JVD, No cervical lymphadenopathy appriciated.  Symmetrical Chest wall movement, Good air movement bilaterally, CTAB RRR,No Gallops, Rubs or new Murmurs, No Parasternal Heave +ve B.Sounds, Abd Soft, No tenderness, No organomegaly appriciated, No rebound - guarding or rigidity. No Cyanosis, Clubbing or edema, No new Rash or  bruise     Data Reviewed: I have personally reviewed following labs and imaging studies  CBC: Recent Labs  Lab 08/20/20 0000 08/20/20 0500 08/21/20 0530 08/21/20 0731 08/22/20 0533 08/22/20 1603 08/23/20 0550 08/24/20 0440 08/25/20 0105 08/26/20 0056  WBC 2.3* 2.6* 4.6  --  5.7  --  5.3 5.9 4.8 4.2  NEUTROABS 1.2* 1.5* 3.5  --   --   --   --   --   --   --   HGB 7.9* 8.1* 7.9*  --  7.0* 7.7* 7.6* 7.4* 8.1* 8.0*  HCT 23.2* 23.5* 23.9*   < > 21.7* 23.5* 22.3* 21.7* 23.3* 22.5*  MCV 91.0 91.4 94.1  --  96.9  --  96.5 95.2 95.9 94.1  PLT 35* 33* 24*  --  22*  --  19* 17* 22* 24*   < > = values in this interval not displayed.    Basic Metabolic Panel: Recent Labs  Lab 08/21/20  1555 08/22/20 0533 08/22/20 1809 08/23/20 0550 08/23/20 1612 08/24/20 0440 08/25/20 0105 08/26/20 0056  NA 135 136 135 133* 131* 131* 131* 129*  K 4.0 3.5 3.7 3.5 3.5 3.4* 3.5 3.3*  CL 104 102 103 103 101 102 99 99  CO2 $Re'26 27 27 24 23 24 'oNN$ 21* 21*  GLUCOSE 138* 154* 109* 151* 165* 128* 122* 158*  BUN 25* 35* 31* 42* 52* 59* 63* 71*  CREATININE 1.60* 2.28* 2.14* 2.60* 2.91* 3.16* 3.56* 3.91*  CALCIUM 7.1* 7.1* 7.1* 6.8* 6.8* 6.5* 6.9* 7.0*  MG  --  2.4  --  2.0  --  2.0 1.7 1.6*  PHOS 2.1* 2.7 2.5 2.3* 2.1*  --   --   --     GFR: Estimated Creatinine Clearance: 16 mL/min (A) (by C-G formula based on SCr of 3.91 mg/dL (H)).  Liver Function Tests: Recent Labs  Lab 08/20/20 0500 08/20/20 1857 08/21/20 0530 08/21/20 1555 08/22/20 0533 08/22/20 1809 08/23/20 0550 08/23/20 1612  AST 18  --  18  --  13*  --   --   --   ALT 13  --  12  --  11  --   --   --   ALKPHOS 35*  --  39  --  37*  --   --   --   BILITOT 0.8  --  0.6  --  0.7  --   --   --   PROT 5.3*  --  5.8*  --  5.5*  --   --   --   ALBUMIN 1.9*  1.9*   < > 1.9*  1.9* 2.0* 2.0*  1.9* 1.7* 1.6* 1.7*   < > = values in this interval not displayed.    CBG: Recent Labs  Lab 08/25/20 2001 08/25/20 2351 08/26/20 0337  08/26/20 0736 08/26/20 1153  GLUCAP 140* 162* 158* 185* 116*     Recent Results (from the past 240 hour(s))  MRSA PCR Screening     Status: None   Collection Time: 08/19/20  5:53 PM  Result Value Ref Range Status   MRSA by PCR NEGATIVE NEGATIVE Final    Comment:        The GeneXpert MRSA Assay (FDA approved for NASAL specimens only), is one component of a comprehensive MRSA colonization surveillance program. It is not intended to diagnose MRSA infection nor to guide or monitor treatment for MRSA infections. Performed at [facility], [place] [address], [place], [place] [address]   SARS CORONAVIRUS 2 (TAT 6-24 HRS) Nasopharyngeal Nasopharyngeal Swab     Status: Abnormal   Collection Time: 08/24/20  5:16 PM   Specimen: Nasopharyngeal Swab  Result Value Ref Range Status   SARS Coronavirus 2 POSITIVE (A) NEGATIVE Final    Comment: (NOTE) SARS-CoV-2 target nucleic acids are DETECTED.  The SARS-CoV-2 RNA is generally detectable in upper and lower respiratory specimens during the acute phase of infection. Positive results are indicative of the presence of SARS-CoV-2 RNA. Clinical correlation with patient history and other diagnostic information is  necessary to determine patient infection status. Positive results do not rule out bacterial infection or co-infection with other viruses.  The expected result is Negative.  Fact Sheet for Patients: SugarRoll.be  Fact Sheet for Healthcare Providers: https://www.woods-mathews.com/  This test is not yet approved or cleared by the [place] FDA and  has been authorized for detection and/or diagnosis of SARS-CoV-2 by FDA under an Emergency Use Authorization (EUA). This EUA  will remain  in effect (meaning this test can be used) for the duration of the COVID-19 declaration under Section 564(b)(1) of the Act, 21 U. S.C. section 360bbb-3(b)(1), unless the authorization is  terminated or revoked sooner.   Performed at [facility], [place] [address] [place], [place] [address]       Radiology Studies: No results found.   Scheduled Meds:  calcium carbonate  1,000 mg of elemental calcium Per Tube BID WC   chlorhexidine  15 mL Mouth Rinse BID   Chlorhexidine Gluconate Cloth  6 each Topical Q0600   darbepoetin (ARANESP) injection - DIALYSIS  60 mcg Intravenous Q Mon-HD   feeding supplement (PROSource TF)  90 mL Per Tube BID   finasteride  5 mg Oral Daily   [START ON 08/27/2020] free water  50 mL Per Tube 6 X Daily   insulin aspart  0-15 Units Subcutaneous Q4H   Ipratropium-Albuterol  1 puff Inhalation TID   lidocaine  1 patch Transdermal Q24H   magic mouthwash  10 mL Oral TID   mouth rinse  15 mL Mouth Rinse q12n4p   metoCLOPramide (REGLAN) injection  5 mg Intravenous Q8H   potassium chloride  40 mEq Oral Once   tamsulosin  0.4 mg Oral QHS   Continuous Infusions:  sodium chloride Stopped (08/22/20 1544)   sodium chloride 100 mL/hr at 08/26/20 1139   [START ON 08/27/2020] feeding supplement (OSMOLITE 1.5 CAL)     magnesium sulfate bolus IVPB     promethazine (PHENERGAN) injection (IM or IVPB) Stopped (08/21/20 0559)     LOS: 15 days   Signature  Lala Lund M.D on 08/26/2020 at 12:26 PM   -  To page go to [email or web address]

## 2020-08-26 NOTE — Progress Notes (Signed)
Subjective:  no acute events overnight. No new complaints. He reports that he is is eating more than before. Has more of an appetite, feels dehydrated. Denies any nausea/vomiting, dysgeusia, loss of appetite, hiccups, pruritis, brain fog.   Objective Vital signs in last 24 hours: Vitals:   08/25/20 2352 08/26/20 0000 08/26/20 0338 08/26/20 0737  BP: (!) 124/59 132/65 133/65 135/70  Pulse: 91 96 99 98  Resp: (!) 22 (!) $Remo'21 17 20  'fmayy$ Temp: 97.8 F (36.6 C)  98 F (36.7 C) 98.1 F (36.7 C)  TempSrc: Axillary  Oral Oral  SpO2: 95% 97% 98% 98%  Weight:   71 kg   Height:       Weight change: -0.6 kg  Intake/Output Summary (Last 24 hours) at 08/26/2020 1039 Last data filed at 08/26/2020 0600 Gross per 24 hour  Intake 1808.33 ml  Output 2850 ml  Net -1041.67 ml   Physical Exam: Gen: more awake today HEENT: anicteric sclera, dry mucosal membranes Chest: s1s2, rrr Resp: cta bl Abd: soft, nt/nd Msk; no edema Neuro: awake, speech clear and coherent, no tremors   Labs: Basic Metabolic Panel: Recent Labs  Lab 08/22/20 1809 08/23/20 0550 08/23/20 1612 08/24/20 0440 08/25/20 0105 08/26/20 0056  NA 135 133* 131* 131* 131* 129*  K 3.7 3.5 3.5 3.4* 3.5 3.3*  CL 103 103 101 102 99 99  CO2 $Re'27 24 23 24 'IRC$ 21* 21*  GLUCOSE 109* 151* 165* 128* 122* 158*  BUN 31* 42* 52* 59* 63* 71*  CREATININE 2.14* 2.60* 2.91* 3.16* 3.56* 3.91*  CALCIUM 7.1* 6.8* 6.8* 6.5* 6.9* 7.0*  PHOS 2.5 2.3* 2.1*  --   --   --    Liver Function Tests: Recent Labs  Lab 08/20/20 0500 08/20/20 1857 08/21/20 0530 08/21/20 1555 08/22/20 0533 08/22/20 1809 08/23/20 0550 08/23/20 1612  AST 18  --  18  --  13*  --   --   --   ALT 13  --  12  --  11  --   --   --   ALKPHOS 35*  --  39  --  37*  --   --   --   BILITOT 0.8  --  0.6  --  0.7  --   --   --   PROT 5.3*  --  5.8*  --  5.5*  --   --   --   ALBUMIN 1.9*  1.9*   < > 1.9*  1.9*   < > 2.0*  1.9* 1.7* 1.6* 1.7*   < > = values in this interval not  displayed.   No results for input(s): LIPASE, AMYLASE in the last 168 hours. Recent Labs  Lab 08/22/20 1206  AMMONIA 13   CBC: Recent Labs  Lab 08/20/20 0000 08/20/20 0500 08/21/20 0530 08/21/20 0731 08/22/20 0533 08/22/20 1603 08/23/20 0550 08/24/20 0440 08/25/20 0105 08/26/20 0056  WBC 2.3* 2.6* 4.6  --  5.7  --  5.3 5.9 4.8 4.2  NEUTROABS 1.2* 1.5* 3.5  --   --   --   --   --   --   --   HGB 7.9* 8.1* 7.9*  --  7.0*   < > 7.6* 7.4* 8.1* 8.0*  HCT 23.2* 23.5* 23.9*   < > 21.7*   < > 22.3* 21.7* 23.3* 22.5*  MCV 91.0 91.4 94.1  --  96.9  --  96.5 95.2 95.9 94.1  PLT 35* 33* 24*  --  22*  --  19* 17* 22* 24*   < > = values in this interval not displayed.   Cardiac Enzymes: No results for input(s): CKTOTAL, CKMB, CKMBINDEX, TROPONINI in the last 168 hours. CBG: Recent Labs  Lab 08/25/20 1621 08/25/20 2001 08/25/20 2351 08/26/20 0337 08/26/20 0736  GLUCAP 159* 140* 162* 158* 185*    Iron Studies:  No results for input(s): IRON, TIBC, TRANSFERRIN, FERRITIN in the last 72 hours.  Studies/Results: No results found. Medications: Infusions:  sodium chloride Stopped (08/22/20 1544)   feeding supplement (OSMOLITE 1.5 CAL) Stopped (08/26/20 2025)   promethazine (PHENERGAN) injection (IM or IVPB) Stopped (08/21/20 0559)    Scheduled Medications:  calcium carbonate  1,000 mg of elemental calcium Per Tube BID WC   chlorhexidine  15 mL Mouth Rinse BID   Chlorhexidine Gluconate Cloth  6 each Topical Q0600   darbepoetin (ARANESP) injection - DIALYSIS  60 mcg Intravenous Q Mon-HD   feeding supplement (PROSource TF)  90 mL Per Tube BID   finasteride  5 mg Oral Daily   free water  50 mL Per Tube 6 X Daily   insulin aspart  0-15 Units Subcutaneous Q4H   Ipratropium-Albuterol  1 puff Inhalation TID   lidocaine  1 patch Transdermal Q24H   magic mouthwash  10 mL Oral TID   mouth rinse  15 mL Mouth Rinse q12n4p   metoCLOPramide (REGLAN) injection  5 mg Intravenous Q8H    tamsulosin  0.4 mg Oral QHS    have reviewed scheduled and prn medications.      Assessment/ Plan: Pt is a 75 y.o. yo male with multiple myeloma who was admitted on 08/11/2020 with COVID-  A on CRT-  crt 1.7 in Jan 2022-  now 7- non oliguric   1. A on CRF-  baseline creat 1.7 in Jan 2022. Renal u/s neg for obstruction. UA unrevealing.  Suspect ATN from hypotension/ ACEi.  Myeloma kidney unlikely- per ONC w/u current myeloma testing is negative. Initially pt was refusing dialysis, but then changed his mind. Due to decline in responsiveness and persistent azotemia (creat in 7's), pt was started on CRRT 6/29. Pt became more alert and responsive w/ CRRT , however he has remained very unmotivated. CRRT dc'd on 7/02 when pt was transferred to [facility].   -Cr higher today, suspecting rate of rise is slowing down/kidney function is settling out. Urine output continues to improve and he does not have any indications for renal replacement therapy at this time. Based on exam, his slightly volume down so will give him a day of mIVF especially as he is eating and drinking more now -He may very well recover his function soon but too early to tell.. Will monitor for further renal replacement therapy needs but no urgent indications present at the moment. Maintain dialysis catheter 2. HTN/volume-  no vol excess on exam today 3. Anemia-  blood transfusion 6/23, Hb in 7-8 range, ordered darbe 60 ug weekly IV started 7/4 4. Hypocalcemia-    no sxms - no action needed -  got bolus during hosp. Monitor albumin or ionized cal, replete prn 5. Hx multiple myeloma - no evidence here of active disease, see ONC notes 6. Pancytopenia - per Hem-Onc 7.  COVID 19 infection - sp course of remdesivir 8. Hyponatremia, mild: likely related to AKI and low solute intake, starting NS  Gean Quint, MD [facility] 08/24/2020, 11:09 AM

## 2020-08-26 NOTE — Progress Notes (Signed)
Occupational Therapy Treatment Patient Details Name: Jeff Rodriguez MRN: [id number] DOB: 12/05/1945 Today's Date: 08/26/2020    History of present illness patient is a 75 year old male who was admitted to the hospital on 6/22 with COVID 19 and AKI; at [facility], had initiated Renal Replacement Therapy, transferred to [facility];  past medical history significant for multiple myeloma, diabetes mellitus, hypertension, asthma, hyperlipidemia, osteoarthritis.   OT comments  Patient progressing and showed improved ability to stand x 4 reps with Min As and to perform 1 grooming task while standing at sink compared to previous session when pt required seated or bed level grooming. Patient remains limited by poor activity tolerance with platelets of 24 today, impaired safety awareness with functional mobility, and generalized weakness, along with deficits noted below. Pt continues to demonstrate fair  rehab potential and would benefit from continued skilled OT to increase safety and independence with ADLs and functional transfers to allow pt to return home safely and reduce caregiver burden and fall risk.   Follow Up Recommendations  SNF    Equipment Recommendations  Tub/shower seat;3 in 1 bedside commode    Recommendations for Other Services      Precautions / Restrictions Precautions Precautions: Fall Precaution Comments: cortrak Restrictions Weight Bearing Restrictions: No       Mobility Bed Mobility Overal bed mobility: Needs Assistance Bed Mobility: Supine to Sit     Supine to sit: Mod assist;HOB elevated Sit to supine: Min assist   General bed mobility comments: Mod As to fully advance BLEs off EOB and to raise trunk from bed with cues for hand placement and heavy use of bed rails.    Transfers Overall transfer level: Needs assistance Equipment used: Rolling walker (2 wheeled) Transfers: Sit to/from [facility] Sit to Stand: From elevated surface (Pt stood x 3 reps from  elevated EOB with Min As.  Pt stood from low EOB 4th rep with Min As. Pt stood from [employer] chair with Min As.)         General transfer comment: Pt with poor safety wareness with use of 2WW. Pt reports "I don't like this walker" and prefers his Rollator. Pt's son stated that he can bring Rollator to hospital. Pt required near constant cues for standing inside walker and proximity to walker. Very stooped and unsafe. Pt stood and pivoted to sink with increased time/effort and Min As.    Balance Overall balance assessment: Needs assistance Sitting-balance support: No upper extremity supported Sitting balance-Leahy Scale: Fair Sitting balance - Comments: Somnolent. Resting foreams on lap in sitting.   Standing balance support: Bilateral upper extremity supported Standing balance-Leahy Scale: Poor Standing balance comment: reliant on UEs and external support             High level balance activites: Side stepping High Level Balance Comments: Pt side stepped from sink back to EOB ~5' with Min As and frequent cues for safety with RW.           ADL either performed or assessed with clinical judgement   ADL Overall ADL's : Needs assistance/impaired     Grooming: Standing;Oral care Grooming Details (indicate cue type and reason): Pt able to stand, very stooped resting BUEs/forearms on vanity and completed 1 grooming task of oral hgyiene.  Pt stated that he was too fatigued to continue any more grooming.             Lower Body Dressing: Bed level;Total assistance Lower Body Dressing Details (indicate cue type  and reason): Pt reported that he is unable to reach feet and pt did require Total assist to don socks.   Toilet Transfer Details (indicate cue type and reason): See Mobility section.   Toileting - Clothing Manipulation Details (indicate cue type and reason): Using external male catheter. Aware of need to void.             Vision   Vision Assessment?: No apparent  visual deficits   Perception     Praxis      Cognition Arousal/Alertness: Awake/alert (Somnolent but awake enough to participate) Behavior During Therapy: WFL for tasks assessed/performed Overall Cognitive Status: Within Functional Limits for tasks assessed                                 General Comments: general impaired awareness of safety and deficits        Exercises     Shoulder Instructions       General Comments      Pertinent Vitals/ Pain       Pain Assessment: Faces Faces Pain Scale: Hurts little more Pain Location: generalized/stiffness Pain Descriptors / Indicators: Discomfort;Sore;Grimacing Pain Intervention(s): Limited activity within patient's tolerance;Monitored during session  Home Living                                          Prior Functioning/Environment              Frequency  Min 2X/week        Progress Toward Goals  OT Goals(current goals can now be found in the care plan section)  Progress towards OT goals: Progressing toward goals  Acute Rehab OT Goals Patient Stated Goal: Go home OT Goal Formulation: With patient/family Time For Goal Achievement: 08/28/20 Potential to Achieve Goals: [place] Discharge plan remains appropriate    Co-evaluation                 AM-PAC OT "6 Clicks" Daily Activity     Outcome Measure   Help from another person eating meals?: A Lot (Pt reports poor appetite. Lunch apperas untouched and pt reported that he was finished.) Help from another person taking care of personal grooming?: A Little Help from another person toileting, which includes using toliet, bedpan, or urinal?: Total Help from another person bathing (including washing, rinsing, drying)?: Total Help from another person to put on and taking off regular upper body clothing?: A Lot Help from another person to put on and taking off regular lower body clothing?: Total 6 Click Score: 10    End  of Session Equipment Utilized During Treatment: Gait belt;Rolling walker  OT Visit Diagnosis: Muscle weakness (generalized) (M62.81);Pain Pain - part of body:  (back)   Activity Tolerance Patient limited by fatigue;Patient limited by lethargy   Patient Left in bed;with call bell/phone within reach;with bed alarm set;with family/visitor present   Nurse Communication  (cleared OT to see pt.)        Time: 1310-1349 OT Time Calculation (min): 39 min  Charges: OT General Charges $OT Visit: 1 Visit OT Treatments $Self Care/Home Management : 8-22 mins $Therapeutic Activity: 23-37 mins  Anderson [place], [place] Office: [phone] 08/26/2020   Julien Girt 08/26/2020, 2:12 PM

## 2020-08-27 ENCOUNTER — Inpatient Hospital Stay (HOSPITAL_COMMUNITY): Payer: No Typology Code available for payment source

## 2020-08-27 DIAGNOSIS — N179 Acute kidney failure, unspecified: Secondary | ICD-10-CM | POA: Diagnosis not present

## 2020-08-27 DIAGNOSIS — N1832 Chronic kidney disease, stage 3b: Secondary | ICD-10-CM | POA: Diagnosis not present

## 2020-08-27 LAB — COMPREHENSIVE METABOLIC PANEL
ALT: 26 U/L (ref 0–44)
AST: 21 U/L (ref 15–41)
Albumin: 2 g/dL — ABNORMAL LOW (ref 3.5–5.0)
Alkaline Phosphatase: 42 U/L (ref 38–126)
Anion gap: 9 (ref 5–15)
BUN: 72 mg/dL — ABNORMAL HIGH (ref 8–23)
CO2: 18 mmol/L — ABNORMAL LOW (ref 22–32)
Calcium: 6.8 mg/dL — ABNORMAL LOW (ref 8.9–10.3)
Chloride: 104 mmol/L (ref 98–111)
Creatinine, Ser: 3.97 mg/dL — ABNORMAL HIGH (ref 0.61–1.24)
GFR, Estimated: 15 mL/min — ABNORMAL LOW (ref >60)
Glucose, Bld: 118 mg/dL — ABNORMAL HIGH (ref 70–99)
Potassium: 3.4 mmol/L — ABNORMAL LOW (ref 3.5–5.1)
Sodium: 131 mmol/L — ABNORMAL LOW (ref 135–145)
Total Bilirubin: 0.7 mg/dL (ref 0.3–1.2)
Total Protein: 6 g/dL — ABNORMAL LOW (ref 6.5–8.1)

## 2020-08-27 LAB — CBC WITH DIFFERENTIAL/PLATELET
Abs Immature Granulocytes: 0.03 10*3/uL (ref 0.00–0.07)
Basophils Absolute: 0 10*3/uL (ref 0.0–0.1)
Basophils Relative: 1 %
Eosinophils Absolute: 0.1 10*3/uL (ref 0.0–0.5)
Eosinophils Relative: 3 %
HCT: 23 % — ABNORMAL LOW (ref 39.0–52.0)
Hemoglobin: 7.8 g/dL — ABNORMAL LOW (ref 13.0–17.0)
Immature Granulocytes: 1 %
Lymphocytes Relative: 23 %
Lymphs Abs: 0.8 10*3/uL (ref 0.7–4.0)
MCH: 31.6 pg (ref 26.0–34.0)
MCHC: 33.9 g/dL (ref 30.0–36.0)
MCV: 93.1 fL (ref 80.0–100.0)
Monocytes Absolute: 0.3 10*3/uL (ref 0.1–1.0)
Monocytes Relative: 8 %
Neutro Abs: 2.4 10*3/uL (ref 1.7–7.7)
Neutrophils Relative %: 64 %
Platelets: 35 10*3/uL — ABNORMAL LOW (ref 150–400)
RBC: 2.47 MIL/uL — ABNORMAL LOW (ref 4.22–5.81)
RDW: 15.6 % — ABNORMAL HIGH (ref 11.5–15.5)
WBC: 3.7 10*3/uL — ABNORMAL LOW (ref 4.0–10.5)
nRBC: 0 % (ref 0.0–0.2)

## 2020-08-27 LAB — MAGNESIUM: Magnesium: 2.2 mg/dL (ref 1.7–2.4)

## 2020-08-27 LAB — GLUCOSE, CAPILLARY
Glucose-Capillary: 112 mg/dL — ABNORMAL HIGH (ref 70–99)
Glucose-Capillary: 102 mg/dL — ABNORMAL HIGH (ref 70–99)
Glucose-Capillary: 160 mg/dL — ABNORMAL HIGH (ref 70–99)
Glucose-Capillary: 148 mg/dL — ABNORMAL HIGH (ref 70–99)
Glucose-Capillary: 151 mg/dL — ABNORMAL HIGH (ref 70–99)

## 2020-08-27 LAB — BRAIN NATRIURETIC PEPTIDE: B Natriuretic Peptide: 142.7 pg/mL — ABNORMAL HIGH (ref 0.0–100.0)

## 2020-08-27 LAB — SAVE SMEAR(SSMR), FOR PROVIDER SLIDE REVIEW

## 2020-08-27 MED ORDER — ENSURE ENLIVE PO LIQD
237.0000 mL | Freq: Three times a day (TID) | ORAL | Status: DC
  Administered 2020-08-27 – 2020-09-11 (×29): 237 mL via ORAL

## 2020-08-27 MED ORDER — POTASSIUM CHLORIDE CRYS ER 20 MEQ PO TBCR
40.0000 meq | EXTENDED_RELEASE_TABLET | Freq: Once | ORAL | Status: AC
  Administered 2020-08-27: 40 meq via ORAL
  Filled 2020-08-27: qty 2

## 2020-08-27 MED ORDER — ADULT MULTIVITAMIN W/MINERALS CH
1.0000 | ORAL_TABLET | Freq: Every day | ORAL | Status: DC
  Administered 2020-08-27 – 2020-09-11 (×13): 1 via ORAL
  Filled 2020-08-27 (×15): qty 1

## 2020-08-27 MED ORDER — MIRTAZAPINE 15 MG PO TBDP
15.0000 mg | ORAL_TABLET | Freq: Every day | ORAL | Status: DC
  Administered 2020-08-27 – 2020-09-11 (×15): 15 mg via ORAL
  Filled 2020-08-27 (×16): qty 1

## 2020-08-27 MED ORDER — SODIUM BICARBONATE 650 MG PO TABS
1300.0000 mg | ORAL_TABLET | Freq: Two times a day (BID) | ORAL | Status: DC
  Administered 2020-08-27 – 2020-09-04 (×18): 1300 mg via ORAL
  Filled 2020-08-27 (×18): qty 2

## 2020-08-27 NOTE — Progress Notes (Signed)
Subjective:  no acute events overnight. No new complaints. He denies any loss of appetite, n/v, dysgesia, brain fog.   Objective Vital signs in last 24 hours: Vitals:   08/26/20 2329 08/27/20 0342 08/27/20 0500 08/27/20 0748  BP: 123/62   (!) 112/57  Pulse: 92   85  Resp: 13 19  (!) 21  Temp: 98.1 F (36.7 C) 98.5 F (36.9 C)  98 F (36.7 C)  TempSrc: Oral Oral  Oral  SpO2: 100%   98%  Weight:   67.5 kg   Height:       Weight change: -3.5 kg  Intake/Output Summary (Last 24 hours) at 08/27/2020 1137 Last data filed at 08/27/2020 1101 Gross per 24 hour  Intake 3029.85 ml  Output 3600 ml  Net -570.15 ml   Physical Exam: Gen: more awake today HEENT: anicteric sclera, dry mucosal membranes Chest: s1s2, rrr Resp: cta bl Abd: soft, nt/nd Msk; no edema Neuro: awake, speech clear and coherent, no tremors/asterixis   Labs: Basic Metabolic Panel: Recent Labs  Lab 08/22/20 1809 08/23/20 0550 08/23/20 1612 08/24/20 0440 08/25/20 0105 08/26/20 0056 08/27/20 0142  NA 135 133* 131*   < > 131* 129* 131*  K 3.7 3.5 3.5   < > 3.5 3.3* 3.4*  CL 103 103 101   < > 99 99 104  CO2 $Re'27 24 23   'lsS$ < > 21* 21* 18*  GLUCOSE 109* 151* 165*   < > 122* 158* 118*  BUN 31* 42* 52*   < > 63* 71* 72*  CREATININE 2.14* 2.60* 2.91*   < > 3.56* 3.91* 3.97*  CALCIUM 7.1* 6.8* 6.8*   < > 6.9* 7.0* 6.8*  PHOS 2.5 2.3* 2.1*  --   --   --   --    < > = values in this interval not displayed.   Liver Function Tests: Recent Labs  Lab 08/21/20 0530 08/21/20 1555 08/22/20 0533 08/22/20 1809 08/23/20 0550 08/23/20 1612 08/27/20 0142  AST 18  --  13*  --   --   --  21  ALT 12  --  11  --   --   --  26  ALKPHOS 39  --  37*  --   --   --  42  BILITOT 0.6  --  0.7  --   --   --  0.7  PROT 5.8*  --  5.5*  --   --   --  6.0*  ALBUMIN 1.9*  1.9*   < > 2.0*  1.9*   < > 1.6* 1.7* 2.0*   < > = values in this interval not displayed.   No results for input(s): LIPASE, AMYLASE in the last 168  hours. Recent Labs  Lab 08/22/20 1206  AMMONIA 13   CBC: Recent Labs  Lab 08/21/20 0530 08/21/20 0731 08/23/20 0550 08/24/20 0440 08/25/20 0105 08/26/20 0056 08/27/20 0142  WBC 4.6   < > 5.3 5.9 4.8 4.2 3.7*  NEUTROABS 3.5  --   --   --   --   --  2.4  HGB 7.9*   < > 7.6* 7.4* 8.1* 8.0* 7.8*  HCT 23.9*   < > 22.3* 21.7* 23.3* 22.5* 23.0*  MCV 94.1   < > 96.5 95.2 95.9 94.1 93.1  PLT 24*   < > 19* 17* 22* 24* 35*   < > = values in this interval not displayed.   Cardiac Enzymes: No results for input(s): CKTOTAL, CKMB,  CKMBINDEX, TROPONINI in the last 168 hours. CBG: Recent Labs  Lab 08/26/20 1646 08/26/20 2024 08/26/20 2333 08/27/20 0340 08/27/20 0750  GLUCAP 133* 171* 103* 112* 102*    Iron Studies:  No results for input(s): IRON, TIBC, TRANSFERRIN, FERRITIN in the last 72 hours.  Studies/Results: DG Chest Port 1 View  Result Date: 08/26/2020 CLINICAL DATA:  Shortness of breath. EXAM: PORTABLE CHEST 1 VIEW COMPARISON:  08/21/2020.  08/19/2020. FINDINGS: Right IJ sheath noted with tip over SVC. Heart size normal. Mild bibasilar atelectasis/infiltrates. Right costophrenic angle incompletely imaged. Interim resolution of bilateral pleural effusions. No pneumothorax. Prior cervical spine fusion. IMPRESSION: 1.  Right IJ sheath noted with tip over SVC. 2. Mild bibasilar atelectasis/infiltrates. Interim resolution of bilateral pleural effusions. Electronically Signed   By: Marcello Moores  Register   On: 08/26/2020 15:23   Medications: Infusions:  sodium chloride Stopped (08/22/20 1544)    Scheduled Medications:  calcium carbonate  1,000 mg of elemental calcium Per Tube BID WC   chlorhexidine  15 mL Mouth Rinse BID   Chlorhexidine Gluconate Cloth  6 each Topical Q0600   darbepoetin (ARANESP) injection - DIALYSIS  60 mcg Intravenous Q Mon-HD   feeding supplement (PROSource TF)  90 mL Per Tube BID   finasteride  5 mg Oral Daily   insulin aspart  0-15 Units Subcutaneous Q4H    Ipratropium-Albuterol  1 puff Inhalation TID   lidocaine  1 patch Transdermal Q24H   magic mouthwash  10 mL Oral TID   mouth rinse  15 mL Mouth Rinse q12n4p   mirtazapine  15 mg Oral QHS   tamsulosin  0.4 mg Oral QHS    have reviewed scheduled and prn medications.      Assessment/ Plan: Pt is a 75 y.o. yo male with multiple myeloma who was admitted on 08/11/2020 with COVID-  A on CRT-  crt 1.7 in Jan 2022-  now 75- non oliguric   1. A on CRF-  baseline creat 1.7 in Jan 2022. Renal u/s neg for obstruction. UA unrevealing.  Suspect ATN from hypotension/ ACEi.  Myeloma kidney unlikely- per ONC w/u current myeloma testing is negative. Initially pt was refusing dialysis, but then changed his mind. Due to decline in responsiveness and persistent azotemia (creat in 7's), pt was started on CRRT 6/29. Pt became more alert and responsive w/ CRRT , however he has remained very unmotivated. CRRT dc'd on 7/02 when pt was transferred to [facility].   -Cr higher today, but rate of rise seems to be slowing down and urine output continues to improve. Without uremic signs or symptoms -He may very well recover his function soon. Will monitor for further renal replacement therapy needs but no urgent indications present at the moment. Maintain dialysis catheter for now but once kidney functions starts to improve/plateau then can likely plan to remove temp line. 2. HTN/volume-  no vol excess on exam today 3. Anemia-  blood transfusion 6/23, Hb in 7-8 range, ordered darbe 60 ug weekly IV started 7/4 4. Hypocalcemia-    no sxms - no action needed -  got bolus during hosp. Monitor albumin or ionized cal, replete prn 5. Hx multiple myeloma - no evidence here of active disease, see ONC notes 6. Pancytopenia - per Hem-Onc 7.  COVID 19 infection - sp course of remdesivir 8. Hyponatremia, mild-improved: likely related to AKI and low solute intake, started NS 9. Metabolic acidosis: start nahco3 $RemoveBefo'1300mg'tOnIyhzMOEL$  bid  Gean Quint,  MD [facility] 08/24/2020, 11:09 AM

## 2020-08-27 NOTE — TOC Progression Note (Signed)
Transition of Care [facility]) - Progression Note    Patient Details  Name: Jeff Rodriguez MRN: [id number] Date of Birth: 04-Sep-1945  Transition of [facility] CM/SW [place], LCSW Phone Number: 08/27/2020, 5:01 PM  Clinical Narrative:    CSW updated patient's son [name] on SNF bed offers and emailed him the facility responses. He reported that he was trying to contact their local hospital to find out which facilities accept rehab patients. CSW will continue to follow.    Expected Discharge Plan: Skilled Nursing Facility Barriers to Discharge: SNF Pending bed offer  Expected Discharge Plan and Services Expected Discharge Plan: [place]   Discharge Planning Services: CM Consult Post Acute Care Choice: [place] Living arrangements for the past 2 months: Single Family Home                                       Social Determinants of Health (SDOH) Interventions    Readmission Risk Interventions No flowsheet data found.

## 2020-08-27 NOTE — Progress Notes (Addendum)
Nutrition Follow-up  DOCUMENTATION CODES:   Non-severe (moderate) malnutrition in context of acute illness/injury  INTERVENTION:   -Ensure Enlive po TID, each supplement provides 350 kcal and 20 grams of protein  -Magic cup TID with meals, each supplement provides 290 kcal and 9 grams of protein  -MVI with minerals daily -Per discussion with MD; no plans to pursue cortrak replacement. Pt may transition to hospice care if worsens  NUTRITION DIAGNOSIS:   Moderate Malnutrition related to acute illness (COVID-19) as evidenced by mild fat depletion, mild muscle depletion, moderate muscle depletion.  Ongoing  GOAL:   Patient will meet greater than or equal to 90% of their needs  Unmet  MONITOR:   PO intake, Labs, Weight trends, TF tolerance, Skin, I & O's  REASON FOR ASSESSMENT:   Consult Enteral/tube feeding initiation and management  ASSESSMENT:   Pt with a PMH significant of multiple myeloma (currently undergoing chemo), DM, HTN, HLD, asthma, and OA admitted with hypokalemia, AKI as well as COVID-19 infection  6/29- NGT placed (proximal duodenum) 7/2- transferred to [facility], CRRT d/c 7/6- cortrak tube dislodged and removed 7/7- advanced to soft diet  Reviewed I/O's: -389 ml x 24 hours and +2 L since 08/13/20  UOP: 3 L x 24 hours  TF on hold secondary to lack of feeding access. Per RN notes, pt consumed about 20% of food from home and experienced nausea and vomiting. SLP evaluation has been ordered. Intake remains minimal. Noted meal completions 10%.   Case discussed with Dr. Candiss Norse; recommend replacement or cortrak tube vs permanent feeding access. Per MD, no plan to replace now and may consider transitioning to hospice if pt's condition worsens.   Per TOC notes, eventual plan to d/c to SNF.   Medications reviewed and include calcium carbonate, aranesp, and remeron.   Labs reviewed: CBGS: 102-160 (inpatient orders for glycemic control are 0-15 units insulin aspart every 4  hours).    Diet Order:   Diet Order             DIET SOFT Room service appropriate? Yes; Fluid consistency: Thin  Diet effective now                   EDUCATION NEEDS:   Not appropriate for education at this time  Skin:  Skin Assessment: Skin Integrity Issues: Skin Integrity Issues:: Stage II Stage I: - Stage II: coccyx  Last BM:  08/25/20  Height:   Ht Readings from Last 1 Encounters:  08/11/20 _0  (1.727 m)    Weight:   Wt Readings from Last 1 Encounters:  08/27/20 67.5 kg    Ideal Body Weight:  70 kg  BMI:  Body mass index is 22.63 kg/m.  Estimated Nutritional Needs:   Kcal:  2000-2200  Protein:  120-135 grams  Fluid:  > 2 L    Loistine Chance, RD, LDN, [place] Registered Dietitian II Certified Diabetes Care and Education Specialist Please refer to [facility] for RD and/or RD on-call/weekend/after hours pager

## 2020-08-27 NOTE — Progress Notes (Signed)
PROGRESS NOTE    Jeff Rodriguez  KCL:[id number] DOB: 1946/02/03 DOA: 08/11/2020 PCP: [facility]    Chief Complaint  Patient presents with   Cough    Brief Narrative:   75 y.o. male with medical history significant of multiple myeloma (on [name]), diabetes, hypertension, asthma, hyperlipidemia and osteoarthritis who was brought in by EMS secondary to productive cough, poor oral intake and not eating much.  Patient is currently undergoing chemo for his multiple myeloma. Tested positive for COVID 19, found to be hypokalemic and  in acute renal failure.  Nephrology team following.  in view of severe thrombocytopenia, oncology consulted and neupogen given. Bone scan 08/18/2020 negative for lytic lesions.  Due to poor prognosis, palliative care team consulted and patient decided for CRRT.  Per nephrology patient was transferred to [facility] for HD.   He has received 2 units of prbc transfusion so far. Pt seen and examined at bedside, no new complaints.    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  Multiple myeloma - Dr. Ennever/oncology on board. Bone scan does not show any lytic lesions at this time.  Anemia of chronic disease - Transfuse to keep hemoglobin greater than 7  Underlying depression and oral intake being poor for several weeks according to the son.  Not suicidal homicidal.  Placed on Remeron and monitor.    Severe thrombocytopenia - No evidence of bleeding at this time.Previous MD [facility] Oncologist Dr. Marin Olp recommended no platelet transfusion at this time as he is not bleeding.  Had oncology follow-up.  Will defer any transfusions to oncology  Acute metabolic encephalopathy - Improving , patient is  alert answering questions appropriately. Initial CT of the head is negative for acute intracranial abnormalities.TSH, vitamin B12 normal, ammonia level within normal limits, this problem is almost completely resolved.  COVID 19 virus infection. Completed the course of remdesivir, continue with flutter valve and incentive spirometer and inhalers as needed. Therapy evaluations recommending SNF. He is off isolation.  Hypokalemia and hypomagnesemia.  Both replaced.  Hypertension: Well controlled.   Hyperlipidemia - resume home dose statin once acute issues better.  Severe PCM.  He had NG tube with tube feeds, NG accidentally pulled out on 08/26/2020, monitor on oral protein supplements and oral diet.  Type 2 diabetes mellitus -  Continue present insulin, outpatient glycemic control poor due to hyperglycemia with A1c of 8.4.  CBG (last 3)  Recent Labs    08/26/20 2333 08/27/20 0340 08/27/20 0750  GLUCAP 103* 112* 102*      Pressure injury on the back.  Pressure Injury 08/21/20 Coccyx Right;Left;Mid Stage 2 -  Partial thickness loss of dermis presenting as a shallow open injury with a red, pink wound bed without slough. (Active)  08/21/20 2000  Location: Coccyx  Location Orientation: Right;Left;Mid  Staging: Stage 2 -  Partial thickness loss of dermis presenting as a shallow open injury with a red, pink wound bed without slough.  Wound Description (Comments):   Present on Admission:      Pressure Injury 08/24/20 Coccyx Medial Stage 2 -  Partial thickness loss of dermis presenting as a shallow open injury with a red, pink wound bed without slough. pink open (Active)  08/24/20 0910  Location: Coccyx  Location Orientation: Medial  Staging: Stage 2 -  Partial thickness loss of dermis presenting as a shallow open injury with a red, pink wound bed without slough.  Wound Description (Comments): pink open  Present on Admission: No   Wound care  will be consulted.    Failure to  thrive Dietary will be consulted.   In view of multiple comorbidities, clinical deterioration, hemodialysis dependent, AKI, poor progression palliative care consulted for goals of care discussion.    DVT prophylaxis: SCDs Code Status: Full code.  Family Communication:  Discussed son [name] [phone] on 08/27/20. Disposition:   Status is: Inpatient  Remains inpatient appropriate because:Ongoing diagnostic testing needed not appropriate for outpatient work up and Unsafe d/c plan  Dispo: The patient is from: Home              Anticipated d/c is to:  pending              Patient currently is not medically stable to d/c.   Difficult to place patient No   Consultants:  Renal, Oncology, Pall Care  Procedures: none.   Antimicrobials:  Antibiotics Given (last 72 hours)     None       Subjective:  Patient in bed, appears comfortable, denies any headache, no fever, no chest pain or pressure, no shortness of breath , no abdominal pain. No new focal weakness.   Objective: Vitals:   08/26/20 2329 08/27/20 0342 08/27/20 0500 08/27/20 0748  BP: 123/62   (!) 112/57  Pulse: 92   85  Resp: 13 19  (!) 21  Temp: 98.1 F (36.7 C) 98.5 F (36.9 C)  98 F (36.7 C)  TempSrc: Oral Oral  Oral  SpO2: 100%   98%  Weight:   67.5 kg   Height:        Intake/Output Summary (Last 24 hours) at 08/27/2020 1043 Last data filed at 08/27/2020 0825 Gross per 24 hour  Intake 2533.18 ml  Output 3600 ml  Net -1066.82 ml   Filed Weights   08/25/20 0346 08/26/20 0338 08/27/20 0500  Weight: 71.6 kg 71 kg 67.5 kg    Examination:  Awake Alert, No new F.N deficits, Normal affect, NG in place, R.IJ HD Cath [place].AT,PERRAL Supple Neck,No JVD, No cervical lymphadenopathy appriciated.  Symmetrical Chest wall movement, Good air movement bilaterally, CTAB RRR,No Gallops, Rubs or new Murmurs, No Parasternal Heave +ve B.Sounds, Abd Soft, No tenderness, No organomegaly appriciated, No rebound - guarding or  rigidity. No Cyanosis, Clubbing or edema, No new Rash or bruise   Data Reviewed: I have personally reviewed following labs and imaging studies  CBC: Recent Labs  Lab 08/21/20 0530 08/21/20 0731 08/23/20 0550 08/24/20 0440 08/25/20 0105 08/26/20 0056 08/27/20 0142  WBC 4.6   < > 5.3 5.9 4.8 4.2 3.7*  NEUTROABS 3.5  --   --   --   --   --  2.4  HGB 7.9*   < > 7.6* 7.4* 8.1* 8.0* 7.8*  HCT 23.9*   < > 22.3* 21.7* 23.3* 22.5* 23.0*  MCV 94.1   < > 96.5 95.2 95.9 94.1 93.1  PLT 24*   < > 19* 17* 22* 24* 35*   < > = values in this interval not displayed.    Basic Metabolic Panel: Recent Labs  Lab 08/21/20 1555 08/22/20 0533 08/22/20 1809 08/23/20 0550 08/23/20 1612 08/24/20 0440 08/25/20 0105 08/26/20 0056 08/27/20 0142  NA 135 136 135 133*  131* 131* 131* 129* 131*  K 4.0 3.5 3.7 3.5 3.5 3.4* 3.5 3.3* 3.4*  CL 104 102 103 103 101 102 99 99 104  CO2 $Re'26 27 27 24 23 24 'SfA$ 21* 21* 18*  GLUCOSE 138* 154* 109* 151* 165* 128* 122* 158* 118*  BUN 25* 35* 31* 42* 52* 59* 63* 71* 72*  CREATININE 1.60* 2.28* 2.14* 2.60* 2.91* 3.16* 3.56* 3.91* 3.97*  CALCIUM 7.1* 7.1* 7.1* 6.8* 6.8* 6.5* 6.9* 7.0* 6.8*  MG  --  2.4  --  2.0  --  2.0 1.7 1.6* 2.2  PHOS 2.1* 2.7 2.5 2.3* 2.1*  --   --   --   --     GFR: Estimated Creatinine Clearance: 15.6 mL/min (A) (by C-G formula based on SCr of 3.97 mg/dL (H)).  Liver Function Tests: Recent Labs  Lab 08/21/20 0530 08/21/20 1555 08/22/20 0533 08/22/20 1809 08/23/20 0550 08/23/20 1612 08/27/20 0142  AST 18  --  13*  --   --   --  21  ALT 12  --  11  --   --   --  26  ALKPHOS 39  --  37*  --   --   --  42  BILITOT 0.6  --  0.7  --   --   --  0.7  PROT 5.8*  --  5.5*  --   --   --  6.0*  ALBUMIN 1.9*  1.9*   < > 2.0*  1.9* 1.7* 1.6* 1.7* 2.0*   < > = values in this interval not displayed.    CBG: Recent Labs  Lab 08/26/20 1646 08/26/20 2024 08/26/20 2333 08/27/20 0340 08/27/20 0750  GLUCAP 133* 171* 103* 112* 102*      Recent Results (from the past 240 hour(s))  MRSA PCR Screening     Status: None   Collection Time: 08/19/20  5:53 PM  Result Value Ref Range Status   MRSA by PCR NEGATIVE NEGATIVE Final    Comment:        The GeneXpert MRSA Assay (FDA approved for NASAL specimens only), is one component of a comprehensive MRSA colonization surveillance program. It is not intended to diagnose MRSA infection nor to guide or monitor treatment for MRSA infections. Performed at [facility], [place] [address], [place], [place] [address]   SARS CORONAVIRUS 2 (TAT 6-24 HRS) Nasopharyngeal Nasopharyngeal Swab     Status: Abnormal   Collection Time: 08/24/20  5:16 PM   Specimen: Nasopharyngeal Swab  Result Value Ref Range Status   SARS Coronavirus 2 POSITIVE (A) NEGATIVE Final    Comment: (NOTE) SARS-CoV-2 target nucleic acids are DETECTED.  The SARS-CoV-2 RNA is generally detectable in upper and lower respiratory specimens during the acute phase of infection. Positive results are indicative of the presence of SARS-CoV-2 RNA. Clinical correlation with patient history and other diagnostic information is  necessary to determine patient infection status. Positive results do not rule out bacterial infection or co-infection with other viruses.  The expected result is Negative.  Fact Sheet for Patients: SugarRoll.be  Fact Sheet for Healthcare Providers: https://www.woods-mathews.com/  This test is not yet approved or cleared by the [place] FDA and  has been authorized for detection and/or diagnosis of SARS-CoV-2 by FDA under an Emergency Use Authorization (EUA). This EUA will remain  in effect (meaning this test can be used) for the duration of the COVID-19 declaration under Section 564(b)(1) of the Act, 21 U. S.C. section 360bbb-3(b)(1),  unless the authorization is terminated or revoked sooner.   Performed at [facility], [place] [address], [place], [place] [address]       Radiology Studies: DG Chest Port 1 View  Result Date: 08/26/2020 CLINICAL DATA:  Shortness of breath. EXAM: PORTABLE CHEST 1 VIEW COMPARISON:  08/21/2020.  08/19/2020. FINDINGS: Right IJ sheath noted with tip over SVC. Heart size normal. Mild bibasilar atelectasis/infiltrates. Right costophrenic angle incompletely imaged. Interim resolution of bilateral pleural effusions. No pneumothorax. Prior cervical spine fusion. IMPRESSION: 1.  Right IJ sheath noted with tip over SVC. 2. Mild bibasilar atelectasis/infiltrates. Interim resolution of bilateral pleural effusions. Electronically Signed   By: Marcello Moores  Register   On: 08/26/2020 15:23     Scheduled Meds:  calcium carbonate  1,000 mg of elemental calcium Per Tube BID WC   chlorhexidine  15 mL Mouth Rinse BID   Chlorhexidine Gluconate Cloth  6 each Topical Q0600   darbepoetin (ARANESP) injection - DIALYSIS  60 mcg Intravenous Q Mon-HD   feeding supplement (PROSource TF)  90 mL Per Tube BID   finasteride  5 mg Oral Daily   insulin aspart  0-15 Units Subcutaneous Q4H   Ipratropium-Albuterol  1 puff Inhalation TID   lidocaine  1 patch Transdermal Q24H   magic mouthwash  10 mL Oral TID   mouth rinse  15 mL Mouth Rinse q12n4p   tamsulosin  0.4 mg Oral QHS   Continuous Infusions:  sodium chloride Stopped (08/22/20 1544)     LOS: 16 days   Signature  Lala Lund M.D on 08/27/2020 at 10:43 AM   -  To page go to [email or web address]

## 2020-08-27 NOTE — Progress Notes (Signed)
Pt has been taken down to radiology by transporter via hospital bed.

## 2020-08-27 NOTE — Progress Notes (Signed)
Family brought in pt some food from home. Pt only eat about 20% before reporting nausea and vomited. Pt was given Zofran 4mg  IVP. Pt was coughing upon entering room. Pt may have difficulty with swallowing food. MD made aware and new order for SLP placed.

## 2020-08-28 DIAGNOSIS — N1832 Chronic kidney disease, stage 3b: Secondary | ICD-10-CM | POA: Diagnosis not present

## 2020-08-28 DIAGNOSIS — N179 Acute kidney failure, unspecified: Secondary | ICD-10-CM | POA: Diagnosis not present

## 2020-08-28 LAB — COMPREHENSIVE METABOLIC PANEL
ALT: 26 U/L (ref 0–44)
AST: 22 U/L (ref 15–41)
Albumin: 2.1 g/dL — ABNORMAL LOW (ref 3.5–5.0)
Alkaline Phosphatase: 42 U/L (ref 38–126)
Anion gap: 8 (ref 5–15)
BUN: 66 mg/dL — ABNORMAL HIGH (ref 8–23)
CO2: 20 mmol/L — ABNORMAL LOW (ref 22–32)
Calcium: 7.2 mg/dL — ABNORMAL LOW (ref 8.9–10.3)
Chloride: 103 mmol/L (ref 98–111)
Creatinine, Ser: 3.97 mg/dL — ABNORMAL HIGH (ref 0.61–1.24)
GFR, Estimated: 15 mL/min — ABNORMAL LOW (ref >60)
Glucose, Bld: 154 mg/dL — ABNORMAL HIGH (ref 70–99)
Potassium: 3.7 mmol/L (ref 3.5–5.1)
Sodium: 131 mmol/L — ABNORMAL LOW (ref 135–145)
Total Bilirubin: 0.6 mg/dL (ref 0.3–1.2)
Total Protein: 6.3 g/dL — ABNORMAL LOW (ref 6.5–8.1)

## 2020-08-28 LAB — CBC WITH DIFFERENTIAL/PLATELET
Abs Immature Granulocytes: 0.03 10*3/uL (ref 0.00–0.07)
Basophils Absolute: 0 10*3/uL (ref 0.0–0.1)
Basophils Relative: 1 %
Eosinophils Absolute: 0.1 10*3/uL (ref 0.0–0.5)
Eosinophils Relative: 3 %
HCT: 24.1 % — ABNORMAL LOW (ref 39.0–52.0)
Hemoglobin: 8 g/dL — ABNORMAL LOW (ref 13.0–17.0)
Immature Granulocytes: 1 %
Lymphocytes Relative: 25 %
Lymphs Abs: 0.8 10*3/uL (ref 0.7–4.0)
MCH: 31.1 pg (ref 26.0–34.0)
MCHC: 33.2 g/dL (ref 30.0–36.0)
MCV: 93.8 fL (ref 80.0–100.0)
Monocytes Absolute: 0.3 10*3/uL (ref 0.1–1.0)
Monocytes Relative: 10 %
Neutro Abs: 2 10*3/uL (ref 1.7–7.7)
Neutrophils Relative %: 60 %
Platelets: 47 10*3/uL — ABNORMAL LOW (ref 150–400)
RBC: 2.57 MIL/uL — ABNORMAL LOW (ref 4.22–5.81)
RDW: 15.8 % — ABNORMAL HIGH (ref 11.5–15.5)
WBC: 3.2 10*3/uL — ABNORMAL LOW (ref 4.0–10.5)
nRBC: 0 % (ref 0.0–0.2)

## 2020-08-28 LAB — URINALYSIS, ROUTINE W REFLEX MICROSCOPIC
Bilirubin Urine: NEGATIVE
Glucose, UA: >=500 mg/dL — AB
Ketones, ur: NEGATIVE mg/dL
Nitrite: NEGATIVE
Protein, ur: 30 mg/dL — AB
Specific Gravity, Urine: 1.006 (ref 1.005–1.030)
WBC, UA: >50 WBC/hpf — ABNORMAL HIGH (ref 0–5)
pH: 8 (ref 5.0–8.0)

## 2020-08-28 LAB — BRAIN NATRIURETIC PEPTIDE: B Natriuretic Peptide: 164.8 pg/mL — ABNORMAL HIGH (ref 0.0–100.0)

## 2020-08-28 LAB — OSMOLALITY, URINE: Osmolality, Ur: 329 mOsm/kg (ref 300–900)

## 2020-08-28 LAB — GLUCOSE, CAPILLARY
Glucose-Capillary: 109 mg/dL — ABNORMAL HIGH (ref 70–99)
Glucose-Capillary: 110 mg/dL — ABNORMAL HIGH (ref 70–99)
Glucose-Capillary: 142 mg/dL — ABNORMAL HIGH (ref 70–99)
Glucose-Capillary: 157 mg/dL — ABNORMAL HIGH (ref 70–99)
Glucose-Capillary: 179 mg/dL — ABNORMAL HIGH (ref 70–99)
Glucose-Capillary: 198 mg/dL — ABNORMAL HIGH (ref 70–99)
Glucose-Capillary: 53 mg/dL — ABNORMAL LOW (ref 70–99)

## 2020-08-28 LAB — URIC ACID: Uric Acid, Serum: 3.8 mg/dL (ref 3.7–8.6)

## 2020-08-28 LAB — SODIUM, URINE, RANDOM: Sodium, Ur: 91 mmol/L

## 2020-08-28 LAB — MAGNESIUM: Magnesium: 1.9 mg/dL (ref 1.7–2.4)

## 2020-08-28 LAB — CREATININE, URINE, RANDOM: Creatinine, Urine: 21.84 mg/dL

## 2020-08-28 LAB — OSMOLALITY: Osmolality: 297 mOsm/kg — ABNORMAL HIGH (ref 275–295)

## 2020-08-28 MED ORDER — DARBEPOETIN ALFA 60 MCG/0.3ML IJ SOSY
60.0000 ug | PREFILLED_SYRINGE | INTRAMUSCULAR | Status: DC
  Administered 2020-08-31: 60 ug via SUBCUTANEOUS
  Filled 2020-08-28 (×3): qty 0.3

## 2020-08-28 NOTE — Evaluation (Signed)
Clinical/Bedside Swallow Evaluation Patient Details  Name: Jeff Rodriguez MRN: [id number] Date of Birth: [birth year]  Today's Date: 08/28/2020 Time: SLP Start Time (ACUTE ONLY): 38 SLP Stop Time (ACUTE ONLY): 1128 SLP Time Calculation (min) (ACUTE ONLY): 13 min  Past Medical History:  Past Medical History:  Diagnosis Date   Asthma    Diabetes mellitus    Hypertension    Past Surgical History:  Past Surgical History:  Procedure Laterality Date   CHOLECYSTECTOMY N/A 04/23/2015   Procedure: LAPAROSCOPIC CHOLECYSTECTOMY WITH INTRAOPERATIVE CHOLANGIOGRAM;  Surgeon: Ralene Ok, MD;  Location: [facility];  Service: General;  Laterality: N/A;   REPLACEMENT TOTAL KNEE Bilateral    TOTAL HIP ARTHROPLASTY Right    HPI:  Jeff Rodriguez is a 75 y.o. male who was brought in by EMS secondary to productive cough, and poor oral intake. Patient is currently undergoing chemo for his multiple myeloma. Jeff Rodriguez reported 4 to 5-days of the coughing and congestion PTA. CXR 7/6: Mild bibasilar atelectasis/infiltrates. Interim resolution of  bilateral pleural effusions. Jeff Rodriguez was evaluated by SLP on 6/29 and SLP services were discontinued on 7/5 with recommendation for a regular texture diet. SLP reconsulted 7/7 due to RN's report of emesis and coughing after p.o. intake. PMH: with medical history significant of multiple myeloma, diabetes, hypertension, diabetes, asthma, hyperlipidemia and osteoarthritis.   Assessment / Plan / Recommendation Clinical Impression  Jeff Rodriguez was seen for bedside swallow evaluation. Jeff Rodriguez reported occasional coughing with liquids which he quantified to occur about once every two weeks. Jeff Rodriguez reported that he was drinking Coke yesterday prior to the episode of emesis and coughing, and that he suspects that "some may have gone the wrong way". Jeff Rodriguez's nurse today, Sander Nephew, reported that the Jeff Rodriguez has been asymptomatic of aspiration/emesis today, and has been tolerating p.o. intake well. Oral mechanism exam was [facility] and he  presented with adequate, natural dentition. Jeff Rodriguez  demonstrated a mildly wet vocal quality once towards the end of the evaluation after intake of thin liquids via straw, but he tolerated all other trials and prior boluses of thin liquids without symptoms of oropharyngeal dysphagia. A regular texture diet with thin liquids is recommended at this time, and SLP will follow briefly to ensure continued tolerance of the recommended diet. SLP Visit Diagnosis: Dysphagia, unspecified (R13.10)    Aspiration Risk  Mild aspiration risk    Diet Recommendation Regular;Thin liquid   Liquid Administration via: Cup;Straw Medication Administration: Whole meds with puree Supervision: Patient able to self feed    Other  Recommendations Oral Care Recommendations: Oral care BID   Follow up Recommendations        Frequency and Duration min 1 x/week  1 week       Prognosis Prognosis for Safe Diet Advancement: Good      Swallow Study   General Date of Onset: 08/11/20 HPI: Jeff Rodriguez is a 75 y.o. male who was brought in by EMS secondary to productive cough, poor oral intake. Patient is currently undergoing chemo for his multiple myeloma. Jeff Rodriguez reported 4 to 5-days of the coughing and congestion PTA. CXR 7/6: Mild bibasilar atelectasis/infiltrates. Interim resolution of  bilateral pleural effusions. Jeff Rodriguez was evaluated by SLP on 6/29 and SLP services were discontinued on 7/5 with recommendation for a regular texture diet. SLP reconsulted 7/7 due to RN's report of emesis and coughing after p.o. intake. PMH: with medical history significant of multiple myeloma, diabetes, hypertension, diabetes, asthma, hyperlipidemia and osteoarthritis. Type of Study: Bedside Swallow Evaluation Previous Swallow Assessment: See HPI Diet Prior  to this Study: Regular;Thin liquids Temperature Spikes Noted: No Respiratory Status: Room air History of Recent Intubation: No Behavior/Cognition: Alert;Cooperative;Distractible Oral Cavity Assessment:  Within Functional Limits Oral Care Completed by SLP: No Oral Cavity - Dentition: Adequate natural dentition;Dentures, top Vision: Functional for self-feeding Self-Feeding Abilities: Able to feed self Patient Positioning: Upright in bed Baseline Vocal Quality: Normal Volitional Cough: Strong Volitional Swallow: Able to elicit    Oral/Motor/Sensory Function Overall Oral Motor/Sensory Function: Within functional limits   Ice Chips Ice chips: Within functional limits Presentation: Spoon   Thin Liquid Thin Liquid: Impaired Presentation: Straw Pharyngeal  Phase Impairments: Wet Vocal Quality    Nectar Thick Nectar Thick Liquid: Not tested   Honey Thick Honey Thick Liquid: Not tested   Puree Puree: Within functional limits Presentation: Spoon   Solid     Solid: Within functional limits Presentation: [place] [address] [place]in Negus, [place], [address] Office number [phone] Pager [phone]  Horton Marshall 08/28/2020,11:33 AM

## 2020-08-28 NOTE — TOC Progression Note (Signed)
Transition of Care [facility]) - Progression Note    Patient Details  Name: FERD HORRIGAN MRN: [id number] Date of Birth: 08/23/1945  Transition of Care [facility] CM/SW [place], LCSW Phone Number: 08/28/2020, 3:58 PM  Clinical Narrative:    CSW still has not received call backs from any facilities near where patient's son lives. Patient's son was able to get in touch with admissions at [facility] and provided CSW with her cell number. CSW contacted Melissa [phone]). She requested CSW fax referral again (f. [phone]); CSW faxed. She will review referral and let CSW know if they can accept patient. They are not in network with the [facility] but are in network with [facility] (plan info: [phone] Group [id number] Phone: [phone]).    Expected Discharge Plan: Skilled Nursing Facility Barriers to Discharge: SNF Pending bed offer  Expected Discharge Plan and Services Expected Discharge Plan: [place]   Discharge Planning Services: CM Consult Post Acute Care Choice: [place] Living arrangements for the past 2 months: Single Family Home                                       Social Determinants of Health (SDOH) Interventions    Readmission Risk Interventions No flowsheet data found.

## 2020-08-28 NOTE — Progress Notes (Signed)
PROGRESS NOTE    Jeff Rodriguez  VZD:[id number] DOB: 1945-09-24 DOA: 08/11/2020 PCP: [facility]    Chief Complaint  Patient presents with   Cough    Brief Narrative:   75 y.o. male with medical history significant of multiple myeloma (on [name]), diabetes, hypertension, asthma, hyperlipidemia and osteoarthritis who was brought in by EMS secondary to productive cough, poor oral intake and not eating much.  Patient is currently undergoing chemo for his multiple myeloma. Tested positive for COVID 19, found to be hypokalemic and  in acute renal failure.  Nephrology team following.  in view of severe thrombocytopenia, oncology consulted and neupogen given. Bone scan 08/18/2020 negative for lytic lesions.  Due to poor prognosis, palliative care team consulted and patient decided for CRRT.  Per nephrology patient was transferred to [facility] for HD.   He has received 2 units of prbc transfusion so far. Pt seen and examined at bedside, no new complaints.    Subjective:  Patient in bed, appears comfortable, denies any headache, no fever, no chest pain or pressure, no shortness of breath , no abdominal pain. No new focal weakness.   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  Multiple myeloma - Dr. Ennever/oncology on board. Bone scan does not show any lytic lesions at this time.  Anemia of chronic disease - Transfuse to keep hemoglobin greater than 7  Lab Results  Component Value Date   WBC 3.2 (L) 08/28/2020   HGB 8.0 (L) 08/28/2020   HCT 24.1 (L) 08/28/2020   MCV 93.8 08/28/2020   PLT 47 (L) 08/28/2020    Underlying depression and oral intake being poor for several weeks according to the son.  Not suicidal  homicidal.  Placed on Remeron and monitor.    Severe thrombocytopenia - No evidence of bleeding at this time.Previous MD [facility] Oncologist Dr. Marin Olp recommended no platelet transfusion at this time as he is not bleeding.  Had oncology follow-up.  Will defer any transfusions to oncology  Acute metabolic encephalopathy - Improving , patient is alert answering questions appropriately. Initial CT of the head is negative for acute intracranial abnormalities.TSH, vitamin B12 normal, ammonia level within normal limits, this problem is almost completely resolved.  COVID 19 virus infection. Completed the course of remdesivir, continue with flutter valve and incentive spirometer and inhalers as needed. Therapy evaluations recommending SNF. He is off isolation.  Hypokalemia and hypomagnesemia.  Both replaced.  Hypertension: Well controlled.   Hyperlipidemia - resume home dose statin once acute issues better.  Severe PCM.  He had NG tube with tube feeds, NG accidentally pulled out on 08/26/2020, monitor on oral protein supplements and oral diet.  Type 2 diabetes mellitus -  Continue present insulin, outpatient glycemic control poor due to hyperglycemia with A1c of 8.4.  CBG (last 3)  Recent Labs    08/28/20 0045 08/28/20 0536 08/28/20 0828  GLUCAP 157* 110* 109*      Pressure injury on the back.  Pressure Injury 08/21/20 Coccyx Right;Left;Mid Stage 2 -  Partial thickness loss of dermis presenting as a shallow open injury with a red, pink wound bed without slough. (Active)  08/21/20 2000  Location: Coccyx  Location Orientation: Right;Left;Mid  Staging:  Stage 2 -  Partial thickness loss of dermis presenting as a shallow open injury with a red, pink wound bed without slough.  Wound Description (Comments):   Present on Admission:      Pressure Injury 08/24/20 Coccyx Medial Stage 2 -  Partial thickness loss of dermis presenting as a shallow open injury with a red, pink wound bed without slough. pink  open (Active)  08/24/20 0910  Location: Coccyx  Location Orientation: Medial  Staging: Stage 2 -  Partial thickness loss of dermis presenting as a shallow open injury with a red, pink wound bed without slough.  Wound Description (Comments): pink open  Present on Admission: No   Wound care  will be consulted.    Failure to thrive Dietary will be consulted.   In view of multiple comorbidities, clinical deterioration, hemodialysis dependent, AKI, poor progression palliative care consulted for goals of care discussion.    DVT prophylaxis: SCDs Code Status: DNR - discussed with patient in detail on 08/28/2020 wants to be DNR, does not want a feeding tube Family Communication:  Discussed son Jeff Rodriguez [phone] on 08/27/20, 08/28/20 - DNR Disposition:   Status is: Inpatient  Remains inpatient appropriate because:Ongoing diagnostic testing needed not appropriate for outpatient work up and Unsafe d/c plan  Dispo: The patient is from: Home              Anticipated d/c is to:  pending              Patient currently is not medically stable to d/c.   Difficult to place patient No   Consultants:  Renal, Oncology, Pall Care  Procedures: none.   Antimicrobials:  Antibiotics Given (last 72 hours)     None         Objective: Vitals:   08/28/20 0024 08/28/20 0400 08/28/20 0423 08/28/20 0826  BP: 127/61 131/69  121/63  Pulse:  89  98  Resp: 19 (!) 22  20  Temp: 98.8 F (37.1 C) 98.5 F (36.9 C)  98.4 F (36.9 C)  TempSrc: Oral Oral  Oral  SpO2: 97% 98%  99%  Weight:   65.6 kg   Height:        Intake/Output Summary (Last 24 hours) at 08/28/2020 1112 Last data filed at 08/28/2020 1051 Gross per 24 hour  Intake 280 ml  Output 2000 ml  Net -1720 ml   Filed Weights   08/26/20 0338 08/27/20 0500 08/28/20 0423  Weight: 71 kg 67.5 kg 65.6 kg    Examination:  Awake Alert, No new F.N deficits, Normal affect, NG in place, R.IJ HD Cath [place].AT,PERRAL Supple Neck,No JVD, No cervical  lymphadenopathy appriciated.  Symmetrical Chest wall movement, Good air movement bilaterally, CTAB RRR,No Gallops, Rubs or new Murmurs, No Parasternal Heave +ve B.Sounds, Abd Soft, No tenderness, No organomegaly appriciated, No rebound - guarding or rigidity. No Cyanosis, Clubbing or edema, No new Rash or bruise  Data Reviewed: I have personally reviewed following labs and imaging studies  CBC: Recent Labs  Lab 08/24/20 0440 08/25/20 0105 08/26/20 0056 08/27/20 0142 08/28/20 0054  WBC 5.9 4.8 4.2 3.7* 3.2*  NEUTROABS  --   --   --  2.4 2.0  HGB 7.4* 8.1* 8.0* 7.8* 8.0*  HCT 21.7* 23.3* 22.5* 23.0* 24.1*  MCV 95.2 95.9 94.1 93.1 93.8  PLT 17* 22* 24* 35* 47*    Basic Metabolic Panel: Recent Labs  Lab 08/21/20 1555 08/21/20 1555 08/22/20 0533 08/22/20 1809 08/23/20 0550 08/23/20 1612 08/24/20  8329 08/25/20 0105 08/26/20 0056 08/27/20 0142 08/28/20 0054  NA 135  --  136 135 133* 131* 131* 131* 129* 131* 131*  K 4.0  --  3.5 3.7 3.5 3.5 3.4* 3.5 3.3* 3.4* 3.7  CL 104  --  102 103 103 101 102 99 99 104 103  CO2 26  --  $R'27 27 24 23 24 'Sz$ 21* 21* 18* 20*  GLUCOSE 138*  --  154* 109* 151* 165* 128* 122* 158* 118* 154*  BUN 25*  --  35* 31* 42* 52* 59* 63* 71* 72* 66*  CREATININE 1.60*  --  2.28* 2.14* 2.60* 2.91* 3.16* 3.56* 3.91* 3.97* 3.97*  CALCIUM 7.1*  --  7.1* 7.1* 6.8* 6.8* 6.5* 6.9* 7.0* 6.8* 7.2*  MG  --    < > 2.4  --  2.0  --  2.0 1.7 1.6* 2.2 1.9  PHOS 2.1*  --  2.7 2.5 2.3* 2.1*  --   --   --   --   --    < > = values in this interval not displayed.    GFR: Estimated Creatinine Clearance: 15.1 mL/min (A) (by C-G formula based on SCr of 3.97 mg/dL (H)).  Liver Function Tests: Recent Labs  Lab 08/22/20 0533 08/22/20 1809 08/23/20 0550 08/23/20 1612 08/27/20 0142 08/28/20 0054  AST 13*  --   --   --  21 22  ALT 11  --   --   --  26 26  ALKPHOS 37*  --   --   --  42 42  BILITOT 0.7  --   --   --  0.7 0.6  PROT 5.5*  --   --   --  6.0* 6.3*  ALBUMIN  2.0*  1.9* 1.7* 1.6* 1.7* 2.0* 2.1*    CBG: Recent Labs  Lab 08/27/20 1555 08/27/20 2018 08/28/20 0045 08/28/20 0536 08/28/20 0828  GLUCAP 148* 151* 157* 110* 109*     Recent Results (from the past 240 hour(s))  MRSA PCR Screening     Status: None   Collection Time: 08/19/20  5:53 PM  Result Value Ref Range Status   MRSA by PCR NEGATIVE NEGATIVE Final    Comment:        The GeneXpert MRSA Assay (FDA approved for NASAL specimens only), is one component of a comprehensive MRSA colonization surveillance program. It is not intended to diagnose MRSA infection nor to guide or monitor treatment for MRSA infections. Performed at [facility], [place] [address], [place], [place] [address]   SARS CORONAVIRUS 2 (TAT 6-24 HRS) Nasopharyngeal Nasopharyngeal Swab     Status: Abnormal   Collection Time: 08/24/20  5:16 PM   Specimen: Nasopharyngeal Swab  Result Value Ref Range Status   SARS Coronavirus 2 POSITIVE (A) NEGATIVE Final    Comment: (NOTE) SARS-CoV-2 target nucleic acids are DETECTED.  The SARS-CoV-2 RNA is generally detectable in upper and lower respiratory specimens during the acute phase of infection. Positive results are indicative of the presence of SARS-CoV-2 RNA. Clinical correlation with patient history and other diagnostic information is  necessary to determine patient infection status. Positive results do not rule out bacterial infection or co-infection with other viruses.  The expected result is Negative.  Fact Sheet for Patients: SugarRoll.be  Fact Sheet for Healthcare Providers: https://www.woods-mathews.com/  This test is not yet approved or cleared by the [place] FDA and  has been authorized for detection and/or diagnosis of SARS-CoV-2 by FDA under an  Emergency Use Authorization (EUA). This EUA will remain  in effect (meaning this test can be used) for the duration of the COVID-19  declaration under Section 564(b)(1) of the Act, 21 U. S.C. section 360bbb-3(b)(1), unless the authorization is terminated or revoked sooner.   Performed at [facility], [place] [address], [place], [place] [address]       Radiology Studies: DG Abd 1 View  Result Date: 08/27/2020 CLINICAL DATA:  Nausea. EXAM: ABDOMEN - 1 VIEW COMPARISON:  08/21/2020 FINDINGS: The upper abdomen was incompletely imaged. A feeding tube is no longer seen. Gas is present in scattered loops of nondilated small and large bowel, predominantly in the right mid abdomen. There is no evidence of bowel obstruction. A right hip arthroplasty and prior augmentation of multiple lumbar compression fractures are again noted. There are surgical clips in the right upper quadrant. IMPRESSION: Nonobstructed bowel gas pattern. Electronically Signed   By: Logan Bores M.D.   On: 08/27/2020 11:58   DG Chest Port 1 View  Result Date: 08/26/2020 CLINICAL DATA:  Shortness of breath. EXAM: PORTABLE CHEST 1 VIEW COMPARISON:  08/21/2020.  08/19/2020. FINDINGS: Right IJ sheath noted with tip over SVC. Heart size normal. Mild bibasilar atelectasis/infiltrates. Right costophrenic angle incompletely imaged. Interim resolution of bilateral pleural effusions. No pneumothorax. Prior cervical spine fusion. IMPRESSION: 1.  Right IJ sheath noted with tip over SVC. 2. Mild bibasilar atelectasis/infiltrates. Interim resolution of bilateral pleural effusions. Electronically Signed   By: Marcello Moores  Register   On: 08/26/2020 15:23     Scheduled Meds:  calcium carbonate  1,000 mg of elemental calcium Per Tube BID WC   chlorhexidine  15 mL Mouth Rinse BID   Chlorhexidine Gluconate Cloth  6 each Topical Q0600   [START ON 08/31/2020] darbepoetin (ARANESP) injection - DIALYSIS  60 mcg Subcutaneous Q Mon-HD   feeding supplement  237 mL Oral TID BM   finasteride  5 mg Oral Daily   insulin aspart  0-15 Units Subcutaneous Q4H   Ipratropium-Albuterol  1 puff  Inhalation TID   lidocaine  1 patch Transdermal Q24H   magic mouthwash  10 mL Oral TID   mouth rinse  15 mL Mouth Rinse q12n4p   mirtazapine  15 mg Oral QHS   multivitamin with minerals  1 tablet Oral Daily   sodium bicarbonate  1,300 mg Oral BID   tamsulosin  0.4 mg Oral QHS   Continuous Infusions:  sodium chloride Stopped (08/22/20 1544)     LOS: 17 days   Signature  Lala Lund M.D on 08/28/2020 at 11:12 AM   -  To page go to [email or web address]

## 2020-08-28 NOTE — Progress Notes (Signed)
Physical Therapy Treatment Patient Details Name: Jeff Rodriguez MRN: [id number] DOB: 01-27-1946 Today's Date: 08/28/2020    History of Present Illness patient is a 75 year old male who was admitted to the hospital on 6/22 with COVID 19 and AKI; at [facility], had initiated Renal Replacement Therapy, transferred to [facility];  past medical history significant for multiple myeloma, diabetes mellitus, hypertension, asthma, hyperlipidemia, osteoarthritis.    PT Comments    Patient received in bed, pleasant and cooperative today. Continues to tolerate mobility well- able to walk multiple short distances in the room with his personal 4 wheeled walker today but did need MinAx2 for balance and safety with this device. Initially needed MaxA for transfers but this faded to [facility] with repeated practice. Does have impaired functional safety awareness- forgot to lock brakes on rollator on multiple occasions. Left up in recliner with all needs met, chair alarm active. Will continue to follow.     Follow Up Recommendations  SNF     Equipment Recommendations  Rolling walker with 5" wheels;3in1 (PT)    Recommendations for Other Services       Precautions / Restrictions Precautions Precautions: Fall Restrictions Weight Bearing Restrictions: No    Mobility  Bed Mobility Overal bed mobility: Needs Assistance Bed Mobility: Supine to Sit     Supine to sit: Mod assist;HOB elevated     General bed mobility comments: mod assist to elevate trunk    Transfers Overall transfer level: Needs assistance Equipment used: 4-wheeled walker Transfers: Sit to/from Stand Sit to Stand: Max assist;Min assist         General transfer comment: pt initially requiring max assist to rise and steady from bed, progressed to min assist with repeated practice, cues for hand placement and to use brakes on rollator  Ambulation/Gait Ambulation/Gait assistance: Min assist;+2 safety/equipment Gait Distance (Feet): 30 Feet  (68ft, 53ft, 44ft) Assistive device: 4-wheeled walker Gait Pattern/deviations: Trunk flexed;Drifts right/left;Step-through pattern;Decreased step length - right;Decreased step length - left;Narrow base of support Gait velocity: decreased   General Gait Details: flexed posture with MinAx2 for safety with device (balance and managing 4WW); of note, he does have awareness to use brakes on device to help slow the walker and prevent it from getting to far away- but still does need help wtih device management   Stairs             Wheelchair Mobility    Modified Rankin (Stroke Patients Only)       Balance Overall balance assessment: Needs assistance Sitting-balance support: No upper extremity supported Sitting balance-Leahy Scale: Fair Sitting balance - Comments: min guard for safety   Standing balance support: Bilateral upper extremity supported Standing balance-Leahy Scale: Poor Standing balance comment: reliant on UEs and external support                            Cognition Arousal/Alertness: Awake/alert Behavior During Therapy: WFL for tasks assessed/performed Overall Cognitive Status: Impaired/Different from baseline Area of Impairment: Memory;Safety/judgement                     Memory: Decreased short-term memory   Safety/Judgement: Decreased awareness of safety     General Comments: needing multiple reminders to lock his brakes on rollator      Exercises      General Comments        Pertinent Vitals/Pain Pain Assessment: No/denies pain Faces Pain Scale: No hurt Pain Intervention(s): Limited activity  within patient's tolerance;Monitored during session    Home Living                      Prior Function            PT Goals (current goals can now be found in the care plan section) Acute Rehab PT Goals Patient Stated Goal: Go home PT Goal Formulation: With patient Time For Goal Achievement: 08/28/20 Potential to Achieve  Goals: Good Progress towards PT goals: Progressing toward goals    Frequency    Min 2X/week      PT Plan Current plan remains appropriate;Equipment recommendations need to be updated    Co-evaluation   Reason for Co-Treatment: For patient/therapist safety   OT goals addressed during session: ADL's and self-care      AM-PAC PT "6 Clicks" Mobility   Outcome Measure  Help needed turning from your back to your side while in a flat bed without using bedrails?: A Little Help needed moving from lying on your back to sitting on the side of a flat bed without using bedrails?: A Lot Help needed moving to and from a bed to a chair (including a wheelchair)?: A Little Help needed standing up from a chair using your arms (e.g., wheelchair or bedside chair)?: A Lot Help needed to walk in hospital room?: A Lot Help needed climbing 3-5 steps with a railing? : Total 6 Click Score: 13    End of Session Equipment Utilized During Treatment: Gait belt Activity Tolerance: Patient tolerated treatment well Patient left: in chair;with call bell/phone within reach;with chair alarm set Nurse Communication: Mobility status PT Visit Diagnosis: Other abnormalities of gait and mobility (R26.89);Muscle weakness (generalized) (M62.81)     Time: 5208-0223 PT Time Calculation (min) (ACUTE ONLY): 34 min  Charges:  $Gait Training: 8-22 mins (co-tx with OT)                    Windell Norfolk, DPT, PN1   Supplemental Physical Therapist [place]    Pager [phone] Acute Rehab Office [phone]

## 2020-08-28 NOTE — Progress Notes (Signed)
Subjective:  no acute events overnight. No new complaints. He reports that his appetite continues to improve day by day.   Objective Vital signs in last 24 hours: Vitals:   08/28/20 0024 08/28/20 0400 08/28/20 0423 08/28/20 0826  BP: 127/61 131/69  121/63  Pulse:  89  98  Resp: 19 (!) 22  20  Temp: 98.8 F (37.1 C) 98.5 F (36.9 C)  98.4 F (36.9 C)  TempSrc: Oral Oral  Oral  SpO2: 97% 98%  99%  Weight:   65.6 kg   Height:       Weight change: -1.9 kg  Intake/Output Summary (Last 24 hours) at 08/28/2020 1117 Last data filed at 08/28/2020 1051 Gross per 24 hour  Intake 280 ml  Output 2000 ml  Net -1720 ml   Physical Exam: Gen: nad, awake/alert HEENT: anicteric sclera, dry mucosal membranes Chest: s1s2, rrr Resp: cta bl Abd: soft, nt/nd Msk; no edema Neuro: awake/alert, speech clear and coherent, no tremors/asterixis   Labs: Basic Metabolic Panel: Recent Labs  Lab 08/22/20 1809 08/23/20 0550 08/23/20 1612 08/24/20 0440 08/26/20 0056 08/27/20 0142 08/28/20 0054  NA 135 133* 131*   < > 129* 131* 131*  K 3.7 3.5 3.5   < > 3.3* 3.4* 3.7  CL 103 103 101   < > 99 104 103  CO2 $Re'27 24 23   'vYD$ < > 21* 18* 20*  GLUCOSE 109* 151* 165*   < > 158* 118* 154*  BUN 31* 42* 52*   < > 71* 72* 66*  CREATININE 2.14* 2.60* 2.91*   < > 3.91* 3.97* 3.97*  CALCIUM 7.1* 6.8* 6.8*   < > 7.0* 6.8* 7.2*  PHOS 2.5 2.3* 2.1*  --   --   --   --    < > = values in this interval not displayed.   Liver Function Tests: Recent Labs  Lab 08/22/20 0533 08/22/20 1809 08/23/20 1612 08/27/20 0142 08/28/20 0054  AST 13*  --   --  21 22  ALT 11  --   --  26 26  ALKPHOS 37*  --   --  42 42  BILITOT 0.7  --   --  0.7 0.6  PROT 5.5*  --   --  6.0* 6.3*  ALBUMIN 2.0*  1.9*   < > 1.7* 2.0* 2.1*   < > = values in this interval not displayed.   No results for input(s): LIPASE, AMYLASE in the last 168 hours. Recent Labs  Lab 08/22/20 1206  AMMONIA 13   CBC: Recent Labs  Lab 08/24/20 0440  08/25/20 0105 08/26/20 0056 08/27/20 0142 08/28/20 0054  WBC 5.9 4.8 4.2 3.7* 3.2*  NEUTROABS  --   --   --  2.4 2.0  HGB 7.4* 8.1* 8.0* 7.8* 8.0*  HCT 21.7* 23.3* 22.5* 23.0* 24.1*  MCV 95.2 95.9 94.1 93.1 93.8  PLT 17* 22* 24* 35* 47*   Cardiac Enzymes: No results for input(s): CKTOTAL, CKMB, CKMBINDEX, TROPONINI in the last 168 hours. CBG: Recent Labs  Lab 08/27/20 1555 08/27/20 2018 08/28/20 0045 08/28/20 0536 08/28/20 0828  GLUCAP 148* 151* 157* 110* 109*    Iron Studies:  No results for input(s): IRON, TIBC, TRANSFERRIN, FERRITIN in the last 72 hours.  Studies/Results: DG Abd 1 View  Result Date: 08/27/2020 CLINICAL DATA:  Nausea. EXAM: ABDOMEN - 1 VIEW COMPARISON:  08/21/2020 FINDINGS: The upper abdomen was incompletely imaged. A feeding tube is no longer seen. Gas is present in  scattered loops of nondilated small and large bowel, predominantly in the right mid abdomen. There is no evidence of bowel obstruction. A right hip arthroplasty and prior augmentation of multiple lumbar compression fractures are again noted. There are surgical clips in the right upper quadrant. IMPRESSION: Nonobstructed bowel gas pattern. Electronically Signed   By: Logan Bores M.D.   On: 08/27/2020 11:58   DG Chest Port 1 View  Result Date: 08/26/2020 CLINICAL DATA:  Shortness of breath. EXAM: PORTABLE CHEST 1 VIEW COMPARISON:  08/21/2020.  08/19/2020. FINDINGS: Right IJ sheath noted with tip over SVC. Heart size normal. Mild bibasilar atelectasis/infiltrates. Right costophrenic angle incompletely imaged. Interim resolution of bilateral pleural effusions. No pneumothorax. Prior cervical spine fusion. IMPRESSION: 1.  Right IJ sheath noted with tip over SVC. 2. Mild bibasilar atelectasis/infiltrates. Interim resolution of bilateral pleural effusions. Electronically Signed   By: Marcello Moores  Register   On: 08/26/2020 15:23   Medications: Infusions:  sodium chloride Stopped (08/22/20 1544)    Scheduled  Medications:  calcium carbonate  1,000 mg of elemental calcium Per Tube BID WC   chlorhexidine  15 mL Mouth Rinse BID   Chlorhexidine Gluconate Cloth  6 each Topical Q0600   [START ON 08/31/2020] darbepoetin (ARANESP) injection - DIALYSIS  60 mcg Subcutaneous Q Mon-HD   feeding supplement  237 mL Oral TID BM   finasteride  5 mg Oral Daily   insulin aspart  0-15 Units Subcutaneous Q4H   Ipratropium-Albuterol  1 puff Inhalation TID   lidocaine  1 patch Transdermal Q24H   magic mouthwash  10 mL Oral TID   mouth rinse  15 mL Mouth Rinse q12n4p   mirtazapine  15 mg Oral QHS   multivitamin with minerals  1 tablet Oral Daily   sodium bicarbonate  1,300 mg Oral BID   tamsulosin  0.4 mg Oral QHS    have reviewed scheduled and prn medications.      Assessment/ Plan: Pt is a 75 y.o. yo male with multiple myeloma who was admitted on 08/11/2020 with COVID-  A on CRT-  crt 1.7 in Jan 2022-  now 7- non oliguric   1. A on CRF-  baseline creat 1.7 in Jan 2022. Renal u/s neg for obstruction. UA unrevealing.  Suspect ATN from hypotension/ ACEi.  Myeloma kidney unlikely- per ONC w/u current myeloma testing is negative. Initially pt was refusing dialysis, but then changed his mind. Due to decline in responsiveness and persistent azotemia (creat in 7's), pt was started on CRRT 6/29. Pt became more alert and responsive w/ CRRT. CRRT dc'd on 7/02 when pt was transferred to [facility].   -Cr plateau'ing which is reassuring. Seems that he will be recovering his kidney function relatively soon, will see where he settles out to. No HD today, urine output is great. Once Cr starts to downtrend, can plan to remove HD cath. 2. HTN/volume-  no vol excess on exam today. Bp acceptable 3. Anemia-  blood transfusion 6/23, Hb in 7-8 range, ordered darbe 60 ug weekly IV started 7/4. Hgb stable 4. Hypocalcemia-    no sxms -   got bolus during hosp. Monitor albumin or ionized cal, replete prn 5. Hx multiple myeloma - no evidence here  of active disease, see ONC notes 6. Pancytopenia - per Hem-Onc 7.  COVID 19 infection - sp course of remdesivir 8. Hyponatremia, mild-stable likely related to AKI and low solute intake, started NS 9. Metabolic acidosis: start nahco3 $RemoveBefo'1300mg'bVxordfUYDv$  bid  Gean Quint, MD [facility]

## 2020-08-28 NOTE — Progress Notes (Signed)
Occupational Therapy Treatment Patient Details Name: Jeff Rodriguez MRN: [id number] DOB: [birth year] Today's Date: 08/28/2020    History of present illness patient is a 75 year old male who was admitted to the hospital on 6/22 with COVID 19 and AKI; at [facility], had initiated Renal Replacement Therapy, transferred to [facility];  past medical history significant for multiple myeloma, diabetes mellitus, hypertension, asthma, hyperlipidemia, osteoarthritis.   OT comments  Pt progressing steadily. Able to ambulate to bathroom with +2 min assist and use of patient's rollator. Initially requiring max assist to rise, progressed to min. Pt completed seated grooming (shaving and combing hair) with set up. Fatigues easily. Educated in pacing. Continues to be appropriate for SNF level rehab.   Follow Up Recommendations  SNF    Equipment Recommendations  Tub/shower seat;3 in 1 bedside commode    Recommendations for Other Services      Precautions / Restrictions Precautions Precautions: Fall       Mobility Bed Mobility Overal bed mobility: Needs Assistance Bed Mobility: Supine to Sit     Supine to sit: Mod assist;HOB elevated     General bed mobility comments: mod assist to elevate trunk    Transfers Overall transfer level: Needs assistance Equipment used: Rolling walker (2 wheeled) Transfers: Sit to/from Stand Sit to Stand: Max assist;Min assist         General transfer comment: pt initially requiring max assist to rise and steady from bed, progressed to min assist, cues for hand placement and to use brakes on rollator    Balance Overall balance assessment: Needs assistance   Sitting balance-Leahy Scale: Fair     Standing balance support: Bilateral upper extremity supported Standing balance-Leahy Scale: Poor Standing balance comment: reliant on UEs and external support                           ADL either performed or assessed with clinical judgement   ADL Overall  ADL's : Needs assistance/impaired     Grooming: Brushing hair;Set up;Sitting (shaving)           Upper Body Dressing : Moderate assistance;Standing Upper Body Dressing Details (indicate cue type and reason): front opening gown     Toilet Transfer: +2 for physical assistance;Minimal assistance;Ambulation;RW;Regular Toilet;Grab bars Toilet Transfer Details (indicate cue type and reason): assist to control descent, cues for hand placement to stand and mod assist Toileting- Clothing Manipulation and Hygiene: Maximal assistance;Sit to/from stand (to keep gown out of toilet)       Functional mobility during ADLs: +2 for physical assistance;Minimal assistance;Rolling walker;Cueing for safety General ADL Comments: Fatigues easily. Instructed in pacing and interspersing rest breaks.     Vision       Perception     Praxis      Cognition Arousal/Alertness: Awake/alert Behavior During Therapy: WFL for tasks assessed/performed Overall Cognitive Status: Impaired/Different from baseline Area of Impairment: Memory;Safety/judgement                     Memory: Decreased short-term memory   Safety/Judgement: Decreased awareness of safety     General Comments: needing multiple reminders to lock his brakes        Exercises     Shoulder Instructions       General Comments      Pertinent Vitals/ Pain       Pain Assessment: No/denies pain Faces Pain Scale: No hurt  Home Living  Prior Functioning/Environment              Frequency  Min 2X/week        Progress Toward Goals  OT Goals(current goals can now be found in the care plan section)  Progress towards OT goals: Progressing toward goals  Acute Rehab OT Goals Patient Stated Goal: Go home OT Goal Formulation: With patient/family Time For Goal Achievement: 09/11/20 Potential to Achieve Goals: [place] Discharge plan remains appropriate     Co-evaluation    PT/OT/SLP Co-Evaluation/Treatment: Yes Reason for Co-Treatment: For patient/therapist safety   OT goals addressed during session: ADL's and self-care      AM-PAC OT "6 Clicks" Daily Activity     Outcome Measure   Help from another person eating meals?: None Help from another person taking care of personal grooming?: A Little Help from another person toileting, which includes using toliet, bedpan, or urinal?: A Lot Help from another person bathing (including washing, rinsing, drying)?: A Lot Help from another person to put on and taking off regular upper body clothing?: A Lot Help from another person to put on and taking off regular lower body clothing?: A Lot 6 Click Score: 15    End of Session Equipment Utilized During Treatment: Gait belt;Rolling walker  OT Visit Diagnosis: Muscle weakness (generalized) (M62.81);Pain   Activity Tolerance Patient tolerated treatment well   Patient Left in chair;with call bell/phone within reach;with chair alarm set   Nurse Communication          Time: 7871-8367 OT Time Calculation (min): 34 min  Charges: OT General Charges $OT Visit: 1 Visit OT Treatments $Self Care/Home Management : 8-22 mins  Nestor Lewandowsky, OTR/L Acute Rehabilitation Services Pager: [phone] Office: [phone]   Malka So 08/28/2020, 2:54 PM

## 2020-08-29 DIAGNOSIS — N179 Acute kidney failure, unspecified: Secondary | ICD-10-CM | POA: Diagnosis not present

## 2020-08-29 DIAGNOSIS — N1832 Chronic kidney disease, stage 3b: Secondary | ICD-10-CM | POA: Diagnosis not present

## 2020-08-29 LAB — GLUCOSE, CAPILLARY
Glucose-Capillary: 104 mg/dL — ABNORMAL HIGH (ref 70–99)
Glucose-Capillary: 108 mg/dL — ABNORMAL HIGH (ref 70–99)
Glucose-Capillary: 165 mg/dL — ABNORMAL HIGH (ref 70–99)
Glucose-Capillary: 174 mg/dL — ABNORMAL HIGH (ref 70–99)
Glucose-Capillary: 185 mg/dL — ABNORMAL HIGH (ref 70–99)
Glucose-Capillary: 192 mg/dL — ABNORMAL HIGH (ref 70–99)
Glucose-Capillary: 201 mg/dL — ABNORMAL HIGH (ref 70–99)
Glucose-Capillary: 292 mg/dL — ABNORMAL HIGH (ref 70–99)
Glucose-Capillary: 58 mg/dL — ABNORMAL LOW (ref 70–99)
Glucose-Capillary: 59 mg/dL — ABNORMAL LOW (ref 70–99)
Glucose-Capillary: 68 mg/dL — ABNORMAL LOW (ref 70–99)

## 2020-08-29 LAB — CBC WITH DIFFERENTIAL/PLATELET
Abs Immature Granulocytes: 0.03 10*3/uL (ref 0.00–0.07)
Basophils Absolute: 0 10*3/uL (ref 0.0–0.1)
Basophils Relative: 1 %
Eosinophils Absolute: 0 10*3/uL (ref 0.0–0.5)
Eosinophils Relative: 1 %
HCT: 22.9 % — ABNORMAL LOW (ref 39.0–52.0)
Hemoglobin: 7.8 g/dL — ABNORMAL LOW (ref 13.0–17.0)
Immature Granulocytes: 1 %
Lymphocytes Relative: 20 %
Lymphs Abs: 0.7 10*3/uL (ref 0.7–4.0)
MCH: 31.6 pg (ref 26.0–34.0)
MCHC: 34.1 g/dL (ref 30.0–36.0)
MCV: 92.7 fL (ref 80.0–100.0)
Monocytes Absolute: 0.4 10*3/uL (ref 0.1–1.0)
Monocytes Relative: 11 %
Neutro Abs: 2.5 10*3/uL (ref 1.7–7.7)
Neutrophils Relative %: 66 %
Platelets: 64 10*3/uL — ABNORMAL LOW (ref 150–400)
RBC: 2.47 MIL/uL — ABNORMAL LOW (ref 4.22–5.81)
RDW: 15.9 % — ABNORMAL HIGH (ref 11.5–15.5)
WBC: 3.8 10*3/uL — ABNORMAL LOW (ref 4.0–10.5)
nRBC: 0 % (ref 0.0–0.2)

## 2020-08-29 LAB — BRAIN NATRIURETIC PEPTIDE: B Natriuretic Peptide: 82.8 pg/mL (ref 0.0–100.0)

## 2020-08-29 LAB — COMPREHENSIVE METABOLIC PANEL
ALT: 26 U/L (ref 0–44)
AST: 22 U/L (ref 15–41)
Albumin: 2.1 g/dL — ABNORMAL LOW (ref 3.5–5.0)
Alkaline Phosphatase: 45 U/L (ref 38–126)
Anion gap: 9 (ref 5–15)
BUN: 63 mg/dL — ABNORMAL HIGH (ref 8–23)
CO2: 21 mmol/L — ABNORMAL LOW (ref 22–32)
Calcium: 7.3 mg/dL — ABNORMAL LOW (ref 8.9–10.3)
Chloride: 102 mmol/L (ref 98–111)
Creatinine, Ser: 4.31 mg/dL — ABNORMAL HIGH (ref 0.61–1.24)
GFR, Estimated: 14 mL/min — ABNORMAL LOW (ref >60)
Glucose, Bld: 187 mg/dL — ABNORMAL HIGH (ref 70–99)
Potassium: 3.3 mmol/L — ABNORMAL LOW (ref 3.5–5.1)
Sodium: 132 mmol/L — ABNORMAL LOW (ref 135–145)
Total Bilirubin: 0.6 mg/dL (ref 0.3–1.2)
Total Protein: 6.3 g/dL — ABNORMAL LOW (ref 6.5–8.1)

## 2020-08-29 LAB — MAGNESIUM: Magnesium: 2 mg/dL (ref 1.7–2.4)

## 2020-08-29 MED ORDER — LACTATED RINGERS IV SOLN: INTRAVENOUS | Status: AC

## 2020-08-29 MED ORDER — DEXTROSE 50 % IV SOLN
INTRAVENOUS | Status: AC
  Administered 2020-08-29: 25 mL
  Filled 2020-08-29: qty 50

## 2020-08-29 MED ORDER — INSULIN ASPART 100 UNIT/ML IJ SOLN
0.0000 [IU] | Freq: Three times a day (TID) | INTRAMUSCULAR | Status: DC
  Administered 2020-08-29: 8 [IU] via SUBCUTANEOUS
  Administered 2020-08-31: 2 [IU] via SUBCUTANEOUS
  Administered 2020-08-31 – 2020-09-02 (×6): 1 [IU] via SUBCUTANEOUS
  Administered 2020-09-02 – 2020-09-03 (×2): 2 [IU] via SUBCUTANEOUS
  Administered 2020-09-03: 3 [IU] via SUBCUTANEOUS
  Administered 2020-09-03 – 2020-09-04 (×2): 1 [IU] via SUBCUTANEOUS
  Administered 2020-09-04: 5 [IU] via SUBCUTANEOUS
  Administered 2020-09-05: 9 [IU] via SUBCUTANEOUS
  Administered 2020-09-05: 5 [IU] via SUBCUTANEOUS
  Administered 2020-09-06 (×2): 3 [IU] via SUBCUTANEOUS
  Administered 2020-09-07: 1 [IU] via SUBCUTANEOUS
  Administered 2020-09-07: 5 [IU] via SUBCUTANEOUS
  Administered 2020-09-08: 3 [IU] via SUBCUTANEOUS
  Administered 2020-09-08 (×2): 1 [IU] via SUBCUTANEOUS
  Administered 2020-09-09: 5 [IU] via SUBCUTANEOUS
  Administered 2020-09-09: 7 [IU] via SUBCUTANEOUS
  Administered 2020-09-10: 3 [IU] via SUBCUTANEOUS
  Administered 2020-09-10 – 2020-09-11 (×3): 2 [IU] via SUBCUTANEOUS
  Administered 2020-09-11: 5 [IU] via SUBCUTANEOUS
  Administered 2020-09-11: 1 [IU] via SUBCUTANEOUS

## 2020-08-29 MED ORDER — POTASSIUM CHLORIDE 20 MEQ PO PACK
40.0000 meq | PACK | Freq: Once | ORAL | Status: AC
  Administered 2020-08-29: 40 meq via ORAL
  Filled 2020-08-29: qty 2

## 2020-08-29 MED ORDER — DEXTROSE 5 % IV SOLN: INTRAVENOUS | Status: DC

## 2020-08-29 MED ORDER — LACTATED RINGERS IV SOLN: INTRAVENOUS | Status: DC

## 2020-08-29 MED ORDER — INSULIN ASPART 100 UNIT/ML IJ SOLN
0.0000 [IU] | Freq: Every day | INTRAMUSCULAR | Status: DC
  Administered 2020-08-31: 2 [IU] via SUBCUTANEOUS
  Administered 2020-09-02: 3 [IU] via SUBCUTANEOUS
  Administered 2020-09-06: 2 [IU] via SUBCUTANEOUS
  Administered 2020-09-11: 4 [IU] via SUBCUTANEOUS

## 2020-08-29 NOTE — Progress Notes (Signed)
Sent message to Dr. Candiss Norse:  patient does not have insulin on MAR and current CBG is 292

## 2020-08-29 NOTE — Progress Notes (Signed)
PROGRESS NOTE    Jeff Rodriguez  IAX:[id number] DOB: 1945-09-13 DOA: 08/11/2020 PCP: [facility]    Chief Complaint  Patient presents with   Cough    Brief Narrative:   75 y.o. male with medical history significant of multiple myeloma (on [name]), diabetes, hypertension, asthma, hyperlipidemia and osteoarthritis who was brought in by EMS secondary to productive cough, poor oral intake and not eating much.  Patient is currently undergoing chemo for his multiple myeloma. Tested positive for COVID 19, found to be hypokalemic and  in acute renal failure.  Nephrology team following.  in view of severe thrombocytopenia, oncology consulted and neupogen given. Bone scan 08/18/2020 negative for lytic lesions.  Due to poor prognosis, palliative care team consulted and patient decided for CRRT.  Per nephrology patient was transferred to [facility] for HD.   He has received 2 units of prbc transfusion so far. Pt seen and examined at bedside, no new complaints.    Subjective:  Patient in bed, appears comfortable, denies any headache, no fever, no chest pain or pressure, no shortness of breath , no abdominal pain. No new focal weakness.   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  Multiple myeloma - Dr. Ennever/oncology on board. Bone scan does not show any lytic lesions at this time.  Anemia of chronic disease - Transfuse to keep hemoglobin greater than 7, type screen and monitor.  Lab Results  Component Value Date   WBC 3.8 (L) 08/29/2020   HGB 7.8 (L) 08/29/2020   HCT 22.9 (L) 08/29/2020   MCV 92.7 08/29/2020   PLT 64 (L) 08/29/2020     Underlying depression and oral intake being poor for several weeks according to the son.  Not suicidal homicidal.  Placed on Remeron and monitor.    Severe thrombocytopenia - No evidence of bleeding at this time.Previous [facility] Oncologist Dr. Marin Olp recommended no platelet transfusion at this time as he is not bleeding.  Had oncology follow-up.  Will defer any transfusions to oncology  Acute metabolic encephalopathy - Improving , patient is alert answering questions appropriately. Initial CT of the head is negative for acute intracranial abnormalities.TSH, vitamin B12 normal, ammonia level within normal limits, this problem is almost completely resolved.  COVID 19 virus infection. Completed the course of remdesivir, continue with flutter valve and incentive spirometer and inhalers as needed. Therapy evaluations recommending SNF. He is off isolation.  Hypokalemia and hypomagnesemia.  Both replaced.  Hypertension: Well controlled.   Hyperlipidemia - resume home dose statin once acute issues better.  Severe PCM.  He had NG tube with tube feeds, NG accidentally pulled out on 08/26/2020, monitor on oral protein supplements and oral diet.  Type 2 diabetes mellitus - monitor without sliding scale, due to poor oral intake getting episodes of hypoglycemia  CBG (last 3)  Recent Labs    08/29/20 0302 08/29/20 0511 08/29/20 0823  GLUCAP 192* 165* 108*      Pressure injury on the back.  Pressure Injury 08/21/20 Coccyx Right;Left;Mid Stage 2 -  Partial thickness loss of dermis presenting as a shallow open injury with a red, pink wound bed without slough. (  Active)  08/21/20 2000  Location: Coccyx  Location Orientation: Right;Left;Mid  Staging: Stage 2 -  Partial thickness loss of dermis presenting as a shallow open injury with a red, pink wound bed without slough.  Wound Description (Comments):   Present on Admission:      Pressure Injury 08/24/20 Coccyx Medial Stage 2 -  Partial thickness  loss of dermis presenting as a shallow open injury with a red, pink wound bed without slough. pink open (Active)  08/24/20 0910  Location: Coccyx  Location Orientation: Medial  Staging: Stage 2 -  Partial thickness loss of dermis presenting as a shallow open injury with a red, pink wound bed without slough.  Wound Description (Comments): pink open  Present on Admission: No   Wound care  will be consulted.    Failure to thrive Dietary will be consulted.   In view of multiple comorbidities, clinical deterioration, hemodialysis dependent, AKI, poor progression palliative care consulted for goals of care discussion.    DVT prophylaxis: SCDs Code Status: DNR - discussed with patient in detail on 08/28/2020 wants to be DNR, does not want a feeding tube Family Communication:  Discussed son [name] [phone] on 08/27/20, 08/28/20 - DNR Disposition:   Status is: Inpatient  Remains inpatient appropriate because:Ongoing diagnostic testing needed not appropriate for outpatient work up and Unsafe d/c plan  Dispo: The patient is from: Home              Anticipated d/c is to:  pending              Patient currently is not medically stable to d/c.   Difficult to place patient No   Consultants:  Renal, Oncology, Pall Care  Procedures: none.   Antimicrobials:  Antibiotics Given (last 72 hours)     None         Objective: Vitals:   08/28/20 2336 08/29/20 0100 08/29/20 0315 08/29/20 0821  BP: (!) 114/49 105/63 126/75   Pulse: (!) 103  (!) 103   Resp: 19  18   Temp: 98.9 F (37.2 C)  98.7 F (37.1 C) 97.7 F (36.5 C)  TempSrc: Axillary  Oral Axillary  SpO2: 96%  97%   Weight:      Height:        Intake/Output Summary (Last 24 hours) at 08/29/2020 0930 Last data filed at 08/29/2020 0207 Gross per 24 hour  Intake 340 ml  Output 500 ml  Net -160 ml   Filed Weights   08/26/20 0338 08/27/20 0500 08/28/20 0423  Weight: 71 kg 67.5 kg 65.6 kg    Examination:  Awake Alert, No new  F.N deficits, Normal affect,   R.IJ HD Cath [place].AT,PERRAL Supple Neck,No JVD, No cervical lymphadenopathy appriciated.  Symmetrical Chest wall movement, Good air movement bilaterally, CTAB RRR,No Gallops, Rubs or new Murmurs, No Parasternal Heave +ve B.Sounds, Abd Soft, No tenderness, No organomegaly appriciated, No rebound - guarding or rigidity. No Cyanosis, Clubbing or edema, No new Rash or bruise   Data Reviewed: I have personally reviewed following labs and imaging studies  CBC: Recent Labs  Lab 08/25/20 0105 08/26/20 0056 08/27/20 0142 08/28/20 0054 08/29/20 0149  WBC 4.8 4.2 3.7* 3.2* 3.8*  NEUTROABS  --   --  2.4 2.0 2.5  HGB 8.1* 8.0* 7.8* 8.0* 7.8*  HCT 23.3* 22.5* 23.0* 24.1* 22.9*  MCV 95.9 94.1 93.1 93.8 92.7  PLT 22* 24* 35* 47* 64*    Basic Metabolic Panel: Recent Labs  Lab 08/22/20  1809 08/23/20 0550 08/23/20 1612 08/24/20 0440 08/25/20 0105 08/26/20 0056 08/27/20 0142 08/28/20 0054 08/29/20 0149  NA 135 133* 131*   < > 131* 129* 131* 131* 132*  K 3.7 3.5 3.5   < > 3.5 3.3* 3.4* 3.7 3.3*  CL 103 103 101   < > 99 99 104 103 102  CO2 $Re'27 24 23   'syI$ < > 21* 21* 18* 20* 21*  GLUCOSE 109* 151* 165*   < > 122* 158* 118* 154* 187*  BUN 31* 42* 52*   < > 63* 71* 72* 66* 63*  CREATININE 2.14* 2.60* 2.91*   < > 3.56* 3.91* 3.97* 3.97* 4.31*  CALCIUM 7.1* 6.8* 6.8*   < > 6.9* 7.0* 6.8* 7.2* 7.3*  MG  --  2.0  --    < > 1.7 1.6* 2.2 1.9 2.0  PHOS 2.5 2.3* 2.1*  --   --   --   --   --   --    < > = values in this interval not displayed.    GFR: Estimated Creatinine Clearance: 14 mL/min (A) (by C-G formula based on SCr of 4.31 mg/dL (H)).  Liver Function Tests: Recent Labs  Lab 08/23/20 0550 08/23/20 1612 08/27/20 0142 08/28/20 0054 08/29/20 0149  AST  --   --  $R'21 22 22  'Tz$ ALT  --   --  $R'26 26 26  'bB$ ALKPHOS  --   --  42 42 45  BILITOT  --   --  0.7 0.6 0.6  PROT  --   --  6.0* 6.3* 6.3*  ALBUMIN 1.6* 1.7* 2.0* 2.1* 2.1*    CBG: Recent Labs  Lab  08/29/20 0107 08/29/20 0200 08/29/20 0302 08/29/20 0511 08/29/20 0823  GLUCAP 174* 201* 192* 165* 108*     Recent Results (from the past 240 hour(s))  MRSA PCR Screening     Status: None   Collection Time: 08/19/20  5:53 PM  Result Value Ref Range Status   MRSA by PCR NEGATIVE NEGATIVE Final    Comment:        The GeneXpert MRSA Assay (FDA approved for NASAL specimens only), is one component of a comprehensive MRSA colonization surveillance program. It is not intended to diagnose MRSA infection nor to guide or monitor treatment for MRSA infections. Performed at [facility], [place] [address], [place], [place] [address]   SARS CORONAVIRUS 2 (TAT 6-24 HRS) Nasopharyngeal Nasopharyngeal Swab     Status: Abnormal   Collection Time: 08/24/20  5:16 PM   Specimen: Nasopharyngeal Swab  Result Value Ref Range Status   SARS Coronavirus 2 POSITIVE (A) NEGATIVE Final    Comment: (NOTE) SARS-CoV-2 target nucleic acids are DETECTED.  The SARS-CoV-2 RNA is generally detectable in upper and lower respiratory specimens during the acute phase of infection. Positive results are indicative of the presence of SARS-CoV-2 RNA. Clinical correlation with patient history and other diagnostic information is  necessary to determine patient infection status. Positive results do not rule out bacterial infection or co-infection with other viruses.  The expected result is Negative.  Fact Sheet for Patients: SugarRoll.be  Fact Sheet for Healthcare Providers: https://www.woods-mathews.com/  This test is not yet approved or cleared by the [place] FDA and  has been authorized for detection and/or diagnosis of SARS-CoV-2 by FDA under an Emergency Use Authorization (EUA). This EUA will remain  in effect (meaning this test can be used) for the duration of the COVID-19 declaration under Section  564(b)(1) of the Act, 21 U. S.C. section  360bbb-3(b)(1), unless the authorization is terminated or revoked sooner.   Performed at [facility], [place] [address], [place], [place] [address]       Radiology Studies: DG Abd 1 View  Result Date: 08/27/2020 CLINICAL DATA:  Nausea. EXAM: ABDOMEN - 1 VIEW COMPARISON:  08/21/2020 FINDINGS: The upper abdomen was incompletely imaged. A feeding tube is no longer seen. Gas is present in scattered loops of nondilated small and large bowel, predominantly in the right mid abdomen. There is no evidence of bowel obstruction. A right hip arthroplasty and prior augmentation of multiple lumbar compression fractures are again noted. There are surgical clips in the right upper quadrant. IMPRESSION: Nonobstructed bowel gas pattern. Electronically Signed   By: Logan Bores M.D.   On: 08/27/2020 11:58     Scheduled Meds:  calcium carbonate  1,000 mg of elemental calcium Per Tube BID WC   chlorhexidine  15 mL Mouth Rinse BID   Chlorhexidine Gluconate Cloth  6 each Topical Q0600   [START ON 08/31/2020] darbepoetin (ARANESP) injection - DIALYSIS  60 mcg Subcutaneous Q Mon-HD   feeding supplement  237 mL Oral TID BM   finasteride  5 mg Oral Daily   Ipratropium-Albuterol  1 puff Inhalation TID   lidocaine  1 patch Transdermal Q24H   magic mouthwash  10 mL Oral TID   mouth rinse  15 mL Mouth Rinse q12n4p   mirtazapine  15 mg Oral QHS   multivitamin with minerals  1 tablet Oral Daily   potassium chloride  40 mEq Oral Once   sodium bicarbonate  1,300 mg Oral BID   tamsulosin  0.4 mg Oral QHS   Continuous Infusions:  sodium chloride Stopped (08/22/20 1544)   lactated ringers       LOS: 18 days   Signature  Lala Lund M.D on 08/29/2020 at 9:30 AM   -  To page go to [email or web address]

## 2020-08-29 NOTE — Progress Notes (Signed)
Subjective:  no acute events overnight. Patient reports that he has not been eating or drinking at much. Feels dehydrated, denies f/c, chest pain, swelling, sob, dysgeusia, n/v, brain fog.   Objective Vital signs in last 24 hours: Vitals:   08/28/20 2336 08/29/20 0100 08/29/20 0315 08/29/20 0821  BP: (!) 114/49 105/63 126/75   Pulse: (!) 103  (!) 103   Resp: 19  18   Temp: 98.9 F (37.2 C)  98.7 F (37.1 C) 97.7 F (36.5 C)  TempSrc: Axillary  Oral Axillary  SpO2: 96%  97%   Weight:      Height:       Weight change:   Intake/Output Summary (Last 24 hours) at 08/29/2020 1104 Last data filed at 08/29/2020 0207 Gross per 24 hour  Intake 240 ml  Output 500 ml  Net -260 ml   Physical Exam: Gen: nad, awake/alert HEENT: anicteric sclera, dry mucosal membranes Chest: s1s2, rrr Resp: cta bl Abd: soft, nt/nd Msk; no edema Skin: poor skin turgor Neuro: awake/alert, speech clear and coherent, no tremors/asterixis   Labs: Basic Metabolic Panel: Recent Labs  Lab 08/22/20 1809 08/23/20 0550 08/23/20 1612 08/24/20 0440 08/27/20 0142 08/28/20 0054 08/29/20 0149  NA 135 133* 131*   < > 131* 131* 132*  K 3.7 3.5 3.5   < > 3.4* 3.7 3.3*  CL 103 103 101   < > 104 103 102  CO2 _0 < > 18* 20* 21*  GLUCOSE 109* 151* 165*   < > 118* 154* 187*  BUN 31* 42* 52*   < > 72* 66* 63*  CREATININE 2.14* 2.60* 2.91*   < > 3.97* 3.97* 4.31*  CALCIUM 7.1* 6.8* 6.8*   < > 6.8* 7.2* 7.3*  PHOS 2.5 2.3* 2.1*  --   --   --   --    < > = values in this interval not displayed.   Liver Function Tests: Recent Labs  Lab 08/27/20 0142 08/28/20 0054 08/29/20 0149  AST _1 ALT _2 ALKPHOS 42 42 45  BILITOT 0.7 0.6 0.6  PROT 6.0* 6.3* 6.3*  ALBUMIN 2.0* 2.1* 2.1*   No results for input(s): LIPASE, AMYLASE in the last 168 hours. Recent Labs  Lab 08/22/20 1206  AMMONIA 13   CBC: Recent Labs  Lab 08/25/20 0105 08/26/20 0056 08/27/20 0142 08/28/20 0054 08/29/20 0149   WBC 4.8 4.2 3.7* 3.2* 3.8*  NEUTROABS  --   --  2.4 2.0 2.5  HGB 8.1* 8.0* 7.8* 8.0* 7.8*  HCT 23.3* 22.5* 23.0* 24.1* 22.9*  MCV 95.9 94.1 93.1 93.8 92.7  PLT 22* 24* 35* 47* 64*   Cardiac Enzymes: No results for input(s): CKTOTAL, CKMB, CKMBINDEX, TROPONINI in the last 168 hours. CBG: Recent Labs  Lab 08/29/20 0107 08/29/20 0200 08/29/20 0302 08/29/20 0511 08/29/20 0823  GLUCAP 174* 201* 192* 165* 108*    Iron Studies:  No results for input(s): IRON, TIBC, TRANSFERRIN, FERRITIN in the last 72 hours.  Studies/Results: DG Abd 1 View  Result Date: 08/27/2020 CLINICAL DATA:  Nausea. EXAM: ABDOMEN - 1 VIEW COMPARISON:  08/21/2020 FINDINGS: The upper abdomen was incompletely imaged. A feeding tube is no longer seen. Gas is present in scattered loops of nondilated small and large bowel, predominantly in the right mid abdomen. There is no evidence of bowel obstruction. A right hip arthroplasty and prior augmentation of multiple lumbar compression fractures are again noted. There are surgical clips  in the right upper quadrant. IMPRESSION: Nonobstructed bowel gas pattern. Electronically Signed   By: Logan Bores M.D.   On: 08/27/2020 11:58   Medications: Infusions:  sodium chloride Stopped (08/22/20 1544)   lactated ringers      Scheduled Medications:  calcium carbonate  1,000 mg of elemental calcium Per Tube BID WC   chlorhexidine  15 mL Mouth Rinse BID   Chlorhexidine Gluconate Cloth  6 each Topical Q0600   [START ON 08/31/2020] darbepoetin (ARANESP) injection - DIALYSIS  60 mcg Subcutaneous Q Mon-HD   feeding supplement  237 mL Oral TID BM   finasteride  5 mg Oral Daily   Ipratropium-Albuterol  1 puff Inhalation TID   lidocaine  1 patch Transdermal Q24H   magic mouthwash  10 mL Oral TID   mouth rinse  15 mL Mouth Rinse q12n4p   mirtazapine  15 mg Oral QHS   multivitamin with minerals  1 tablet Oral Daily   potassium chloride  40 mEq Oral Once   sodium bicarbonate  1,300 mg  Oral BID   tamsulosin  0.4 mg Oral QHS    have reviewed scheduled and prn medications.      Assessment/ Plan: Pt is a 75 y.o. yo male with multiple myeloma who was admitted on 08/11/2020 with COVID-  A on CRT-  crt 1.7 in Jan 2022-  now 7- non oliguric   1. A on CRF-  baseline creat 1.7 in Jan 2022. Renal u/s neg for obstruction. UA unrevealing.  Suspect ATN from hypotension/ ACEi.  Myeloma kidney unlikely- per ONC w/u current myeloma testing is negative. Initially pt was refusing dialysis, but then changed his mind. Due to decline in responsiveness and persistent azotemia (creat in 7's), pt was started on CRRT 6/29. Pt became more alert and responsive w/ CRRT. CRRT dc'd on 7/02 when pt was transferred to [facility].   -Cr slightly up today with low PO intake. This may be pre-renal. Will start isotonic fluids for about a day. No indications for renal replacement therapy but would recommend maintaining catheter -cont to check daily labs, strict I/O 2. HTN/volume-  no vol excess on exam today. Bp acceptable 3. Anemia-  blood transfusion 6/23, Hb in 7-8 range, ordered darbe 60 ug weekly IV started 7/4. Hgb stable 4. Hypocalcemia-    no sxms -   got bolus during hosp. Monitor albumin or ionized cal, replete prn 5. Hx multiple myeloma - no evidence here of active disease, see ONC notes 6. Pancytopenia - per Hem-Onc 7.  COVID 19 infection - sp course of remdesivir 8. Hyponatremia, mild-stable 9. Metabolic acidosis: start d nahco3 1376m bid  VGean Quint MD C[facility]

## 2020-08-30 DIAGNOSIS — N1832 Chronic kidney disease, stage 3b: Secondary | ICD-10-CM | POA: Diagnosis not present

## 2020-08-30 DIAGNOSIS — N179 Acute kidney failure, unspecified: Secondary | ICD-10-CM | POA: Diagnosis not present

## 2020-08-30 LAB — CBC WITH DIFFERENTIAL/PLATELET
Abs Immature Granulocytes: 0.03 10*3/uL (ref 0.00–0.07)
Basophils Absolute: 0.1 10*3/uL (ref 0.0–0.1)
Basophils Relative: 1 %
Eosinophils Absolute: 0.1 10*3/uL (ref 0.0–0.5)
Eosinophils Relative: 3 %
HCT: 24.1 % — ABNORMAL LOW (ref 39.0–52.0)
Hemoglobin: 8 g/dL — ABNORMAL LOW (ref 13.0–17.0)
Immature Granulocytes: 1 %
Lymphocytes Relative: 21 %
Lymphs Abs: 0.8 10*3/uL (ref 0.7–4.0)
MCH: 31 pg (ref 26.0–34.0)
MCHC: 33.2 g/dL (ref 30.0–36.0)
MCV: 93.4 fL (ref 80.0–100.0)
Monocytes Absolute: 0.5 10*3/uL (ref 0.1–1.0)
Monocytes Relative: 12 %
Neutro Abs: 2.3 10*3/uL (ref 1.7–7.7)
Neutrophils Relative %: 62 %
Platelets: 92 10*3/uL — ABNORMAL LOW (ref 150–400)
RBC: 2.58 MIL/uL — ABNORMAL LOW (ref 4.22–5.81)
RDW: 16 % — ABNORMAL HIGH (ref 11.5–15.5)
WBC: 3.7 10*3/uL — ABNORMAL LOW (ref 4.0–10.5)
nRBC: 0 % (ref 0.0–0.2)

## 2020-08-30 LAB — COMPREHENSIVE METABOLIC PANEL
ALT: 30 U/L (ref 0–44)
AST: 24 U/L (ref 15–41)
Albumin: 2.2 g/dL — ABNORMAL LOW (ref 3.5–5.0)
Alkaline Phosphatase: 45 U/L (ref 38–126)
Anion gap: 12 (ref 5–15)
BUN: 59 mg/dL — ABNORMAL HIGH (ref 8–23)
CO2: 21 mmol/L — ABNORMAL LOW (ref 22–32)
Calcium: 7.5 mg/dL — ABNORMAL LOW (ref 8.9–10.3)
Chloride: 101 mmol/L (ref 98–111)
Creatinine, Ser: 4.26 mg/dL — ABNORMAL HIGH (ref 0.61–1.24)
GFR, Estimated: 14 mL/min — ABNORMAL LOW (ref >60)
Glucose, Bld: 130 mg/dL — ABNORMAL HIGH (ref 70–99)
Potassium: 3.4 mmol/L — ABNORMAL LOW (ref 3.5–5.1)
Sodium: 134 mmol/L — ABNORMAL LOW (ref 135–145)
Total Bilirubin: 0.6 mg/dL (ref 0.3–1.2)
Total Protein: 6.5 g/dL (ref 6.5–8.1)

## 2020-08-30 LAB — TYPE AND SCREEN
ABO/RH(D): O NEG
Antibody Screen: NEGATIVE

## 2020-08-30 LAB — BRAIN NATRIURETIC PEPTIDE: B Natriuretic Peptide: 58 pg/mL (ref 0.0–100.0)

## 2020-08-30 LAB — MAGNESIUM: Magnesium: 1.8 mg/dL (ref 1.7–2.4)

## 2020-08-30 LAB — GLUCOSE, CAPILLARY
Glucose-Capillary: 118 mg/dL — ABNORMAL HIGH (ref 70–99)
Glucose-Capillary: 121 mg/dL — ABNORMAL HIGH (ref 70–99)
Glucose-Capillary: 171 mg/dL — ABNORMAL HIGH (ref 70–99)

## 2020-08-30 MED ORDER — POTASSIUM CHLORIDE CRYS ER 20 MEQ PO TBCR
40.0000 meq | EXTENDED_RELEASE_TABLET | Freq: Once | ORAL | Status: AC
  Administered 2020-08-30: 40 meq via ORAL

## 2020-08-30 MED ORDER — SODIUM CHLORIDE 0.9% FLUSH: 10.0000 mL | INTRAVENOUS | Status: DC | PRN

## 2020-08-30 NOTE — Progress Notes (Signed)
Subjective:  no acute events overnight. Patient reports that he does not feel as dehydrated as compared to yesterday. Doing better with eating and drinking. No other complaints   Objective Vital signs in last 24 hours: Vitals:   08/29/20 2324 08/30/20 0334 08/30/20 0748 08/30/20 1155  BP: 112/68 128/72 120/68 113/75  Pulse: (!) 101 (!) 102 99 99  Resp: _0 Temp: 98.6 F (37 C) 99 F (37.2 C) 99.1 F (37.3 C) 98.8 F (37.1 C)  TempSrc: Oral Oral Axillary Axillary  SpO2: 99% 98% 100% 100%  Weight:      Height:       Weight change:   Intake/Output Summary (Last 24 hours) at 08/30/2020 1315 Last data filed at 08/29/2020 2335 Gross per 24 hour  Intake --  Output 650 ml  Net -650 ml   Physical Exam: Gen: nad, awake/alert, sitting in chair HEENT: anicteric sclera, MMM Chest: s1s2, rrr Resp: cta bl Abd: soft, nt/nd Msk; no edema Skin: poor skin turgor Neuro: awake/alert, speech clear and coherent, no tremors/asterixis   Labs: Basic Metabolic Panel: Recent Labs  Lab 08/23/20 1612 08/24/20 0440 08/28/20 0054 08/29/20 0149 08/30/20 0059  NA 131*   < > 131* 132* 134*  K 3.5   < > 3.7 3.3* 3.4*  CL 101   < > 103 102 101  CO2 23   < > 20* 21* 21*  GLUCOSE 165*   < > 154* 187* 130*  BUN 52*   < > 66* 63* 59*  CREATININE 2.91*   < > 3.97* 4.31* 4.26*  CALCIUM 6.8*   < > 7.2* 7.3* 7.5*  PHOS 2.1*  --   --   --   --    < > = values in this interval not displayed.   Liver Function Tests: Recent Labs  Lab 08/28/20 0054 08/29/20 0149 08/30/20 0059  AST _1 ALT _2 ALKPHOS 42 45 45  BILITOT 0.6 0.6 0.6  PROT 6.3* 6.3* 6.5  ALBUMIN 2.1* 2.1* 2.2*   No results for input(s): LIPASE, AMYLASE in the last 168 hours. No results for input(s): AMMONIA in the last 168 hours.  CBC: Recent Labs  Lab 08/26/20 0056 08/26/20 0056 08/27/20 0142 08/28/20 0054 08/29/20 0149 08/30/20 0059  WBC 4.2  --  3.7* 3.2* 3.8* 3.7*  NEUTROABS  --    < > 2.4 2.0  2.5 2.3  HGB 8.0*  --  7.8* 8.0* 7.8* 8.0*  HCT 22.5*  --  23.0* 24.1* 22.9* 24.1*  MCV 94.1  --  93.1 93.8 92.7 93.4  PLT 24*  --  35* 47* 64* 92*   < > = values in this interval not displayed.   Cardiac Enzymes: No results for input(s): CKTOTAL, CKMB, CKMBINDEX, TROPONINI in the last 168 hours. CBG: Recent Labs  Lab 08/29/20 1232 08/29/20 1737 08/29/20 2047 08/30/20 0747 08/30/20 1201  GLUCAP 104* 292* 185* 118* 121*    Iron Studies:  No results for input(s): IRON, TIBC, TRANSFERRIN, FERRITIN in the last 72 hours.  Studies/Results: No results found. Medications: Infusions:  sodium chloride Stopped (08/22/20 1544)    Scheduled Medications:  calcium carbonate  1,000 mg of elemental calcium Per Tube BID WC   chlorhexidine  15 mL Mouth Rinse BID   Chlorhexidine Gluconate Cloth  6 each Topical Q0600   [START ON 08/31/2020] darbepoetin (ARANESP) injection - DIALYSIS  60 mcg Subcutaneous Q Mon-HD   feeding supplement  237  mL Oral TID BM   finasteride  5 mg Oral Daily   insulin aspart  0-5 Units Subcutaneous QHS   insulin aspart  0-9 Units Subcutaneous TID WC   Ipratropium-Albuterol  1 puff Inhalation TID   lidocaine  1 patch Transdermal Q24H   magic mouthwash  10 mL Oral TID   mouth rinse  15 mL Mouth Rinse q12n4p   mirtazapine  15 mg Oral QHS   multivitamin with minerals  1 tablet Oral Daily   potassium chloride  40 mEq Oral Once   sodium bicarbonate  1,300 mg Oral BID   tamsulosin  0.4 mg Oral QHS    have reviewed scheduled and prn medications.      Assessment/ Plan: Pt is a 75 y.o. yo male with multiple myeloma who was admitted on 08/11/2020 with COVID-  A on CRT-  crt 1.7 in Jan 2022-  now 7- non oliguric   1. A on CRF-  baseline creat 1.7 in Jan 2022. Renal u/s neg for obstruction. UA unrevealing.  Suspect ATN from hypotension/ ACEi.  Myeloma kidney unlikely- per ONC w/u current myeloma testing is negative. Initially pt was refusing dialysis, but then changed  his mind. Due to decline in responsiveness and persistent azotemia (creat in 7's), pt was started on CRRT 6/29. Pt became more alert and responsive w/ CRRT. CRRT dc'd on 7/02 when pt was transferred to [facility].   -Cr stable today (likely in plateau phase), has been eating and drinking more. Volume status better, can hold off on further IVF, No indication for renal replacement therapy as of today, maintain catheter. Waiting for kidney function to settle -cont to check daily labs, strict I/O 2. HTN/volume-  no vol excess on exam today. Bp acceptable 3. Anemia-  blood transfusion 6/23, Hb in 7-8 range, ordered darbe 60 ug weekly IV started 7/4. Hgb stable 4. Hypocalcemia-    no sxms -   got bolus during hosp. Monitor albumin or ionized cal, replete prn 5. Hx multiple myeloma - no evidence here of active disease, see ONC notes 6. Pancytopenia - per Hem-Onc 7.  COVID 19 infection - sp course of remdesivir 8. Hyponatremia, mild-stable 9. Metabolic acidosis: started nahco3 1333m bid  VGean Quint MD [facility]

## 2020-08-30 NOTE — Progress Notes (Addendum)
PROGRESS NOTE    Jeff Rodriguez  ZOX:[id number] DOB: [birth year] DOA: 08/11/2020 PCP: [facility]    Chief Complaint  Patient presents with   Cough    Brief Narrative:   75 y.o. male with medical history significant of multiple myeloma (on [name]), diabetes, hypertension, asthma, hyperlipidemia and osteoarthritis who was brought in by EMS secondary to productive cough, poor oral intake and not eating much.  Patient is currently undergoing chemo for his multiple myeloma. Tested positive for COVID 19, found to be hypokalemic and  in acute renal failure.  Nephrology team following.  in view of severe thrombocytopenia, oncology consulted and neupogen given. Bone scan 08/18/2020 negative for lytic lesions.  Due to poor prognosis, palliative care team consulted and patient decided for CRRT.  Per nephrology patient was transferred to [facility] for HD.   He has received 2 units of prbc transfusion so far. Pt seen and examined at bedside, no new complaints.    Subjective:  Patient in bed, appears comfortable, denies any headache, no fever, no chest pain or pressure, no shortness of breath , no abdominal pain. No new focal weakness.   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  Multiple myeloma - Dr. Ennever/oncology on board. Bone scan does not show any lytic lesions at this time.  Anemia of chronic disease - Transfuse to keep hemoglobin greater than 7, type screen and monitor done.  Lab Results  Component Value Date   WBC 3.7 (L) 08/30/2020   HGB 8.0 (L) 08/30/2020   HCT 24.1 (L) 08/30/2020   MCV 93.4 08/30/2020   PLT 92 (L) 08/30/2020     Underlying depression and oral intake being poor for several weeks according to the son.  Not suicidal homicidal.  Placed on Remeron and monitor.    Severe thrombocytopenia - No evidence of bleeding at this time.Previous [facility] Oncologist Dr. Marin Olp recommended no platelet transfusion at this time as he is not bleeding.  Had oncology follow-up.  Will defer any transfusions to oncology, platelet counts are gradually improving.  Acute metabolic encephalopathy - Improving , patient is alert answering questions appropriately. Initial CT of the head is negative for acute intracranial abnormalities.TSH, vitamin B12 normal, ammonia level within normal limits, this problem is almost completely resolved.  COVID 19 virus infection. Completed the course of remdesivir, continue with flutter valve and incentive spirometer and inhalers as needed. Therapy evaluations recommending SNF. He is off isolation.  Hypokalemia and hypomagnesemia.  Both replaced.  Hypertension: Well controlled.   Hyperlipidemia - resume home dose statin once acute issues better.  Severe PCM.  He had NG tube with tube feeds, NG accidentally pulled out on 08/26/2020, monitor on oral protein supplements and oral diet.  Type 2 diabetes mellitus -labile sugars due to labile oral intake, monitor on low-dose sliding scale  CBG (last 3)  Recent Labs    08/29/20 1737 08/29/20 2047 08/30/20 0747  GLUCAP 292* 185* 118*      Pressure injury on the back.  Pressure Injury 08/21/20 Coccyx Right;Left;Mid Stage 2 -  Partial thickness loss of dermis presenting as a shallow open injury with a red, pink  wound bed without slough. (Active)  08/21/20 2000  Location: Coccyx  Location Orientation: Right;Left;Mid  Staging: Stage 2 -  Partial thickness loss of dermis presenting as a shallow open injury with a red, pink wound bed without slough.  Wound Description (Comments):   Present on Admission:      Pressure Injury 08/24/20 Coccyx Medial  Stage 2 -  Partial thickness loss of dermis presenting as a shallow open injury with a red, pink wound bed without slough. pink open (Active)  08/24/20 0910  Location: Coccyx  Location Orientation: Medial  Staging: Stage 2 -  Partial thickness loss of dermis presenting as a shallow open injury with a red, pink wound bed without slough.  Wound Description (Comments): pink open  Present on Admission: No   Wound care  will be consulted.    Failure to thrive Dietary will be consulted.   In view of multiple comorbidities, clinical deterioration, hemodialysis dependent, AKI, poor progression palliative care consulted for goals of care discussion.    DVT prophylaxis: SCDs Code Status: DNR - discussed with patient in detail on 08/28/2020 wants to be DNR, does not want a feeding tube Family Communication:  Discussed son [name] [phone] on 08/27/20, 08/28/20 - DNR Disposition:   Status is: Inpatient  Remains inpatient appropriate because:Ongoing diagnostic testing needed not appropriate for outpatient work up and Unsafe d/c plan  Dispo: The patient is from: Home              Anticipated d/c is to:  pending              Patient currently is not medically stable to d/c.   Difficult to place patient No   Consultants:  Renal, Oncology, Pall Care  Procedures: none.   Antimicrobials:  Antibiotics Given (last 72 hours)     None         Objective: Vitals:   08/29/20 1936 08/29/20 2324 08/30/20 0334 08/30/20 0748  BP: (!) 119/54 112/68 128/72 120/68  Pulse: 100 (!) 101 (!) 102 99  Resp: _0 Temp: 99.1 F (37.3 C) 98.6 F (37 C) 99 F (37.2 C) 99.1 F (37.3 C)  TempSrc: Oral Oral Oral Axillary  SpO2: 99% 99% 98% 100%  Weight:      Height:        Intake/Output Summary (Last 24 hours) at 08/30/2020 0944 Last data filed at 08/29/2020 2335 Gross per 24 hour  Intake --  Output 650 ml  Net -650 ml   Filed Weights   08/26/20 0338 08/27/20 0500 08/28/20 0423  Weight: 71  kg 67.5 kg 65.6 kg    Examination:  Awake Alert, No new F.N deficits, Normal affect,   R.IJ HD Cath [place].AT,PERRAL Supple Neck,No JVD, No cervical lymphadenopathy appriciated.  Symmetrical Chest wall movement, Good air movement bilaterally, CTAB RRR,No Gallops, Rubs or new Murmurs, No Parasternal Heave +ve B.Sounds, Abd Soft, No tenderness, No organomegaly appriciated, No rebound - guarding or rigidity. No Cyanosis, Clubbing or edema, No new Rash or bruise    Data Reviewed: I have personally reviewed following labs and imaging studies  CBC: Recent Labs  Lab 08/26/20 0056 08/27/20 0142 08/28/20 0054 08/29/20 0149 08/30/20 0059  WBC 4.2 3.7* 3.2* 3.8* 3.7*  NEUTROABS  --  2.4 2.0 2.5 2.3  HGB 8.0* 7.8* 8.0* 7.8* 8.0*  HCT 22.5* 23.0* 24.1* 22.9* 24.1*  MCV 94.1 93.1 93.8 92.7 93.4  PLT 24* 35* 47* 64* 92*    Basic Metabolic Panel:  Recent Labs  Lab 08/23/20 1612 08/24/20 0440 08/26/20 0056 08/27/20 0142 08/28/20 0054 08/29/20 0149 08/30/20 0059  NA 131*   < > 129* 131* 131* 132* 134*  K 3.5   < > 3.3* 3.4* 3.7 3.3* 3.4*  CL 101   < > 99 104 103 102 101  CO2 23   < > 21* 18* 20* 21* 21*  GLUCOSE 165*   < > 158* 118* 154* 187* 130*  BUN 52*   < > 71* 72* 66* 63* 59*  CREATININE 2.91*   < > 3.91* 3.97* 3.97* 4.31* 4.26*  CALCIUM 6.8*   < > 7.0* 6.8* 7.2* 7.3* 7.5*  MG  --    < > 1.6* 2.2 1.9 2.0 1.8  PHOS 2.1*  --   --   --   --   --   --    < > = values in this interval not displayed.    GFR: Estimated Creatinine Clearance: 14.1 mL/min (A) (by C-G formula based on SCr of 4.26 mg/dL (H)).  Liver Function Tests: Recent Labs  Lab 08/23/20 1612 08/27/20 0142 08/28/20 0054 08/29/20 0149 08/30/20 0059  AST  --  _0 ALT  --  _1 ALKPHOS  --  42 42 45 45  BILITOT  --  0.7 0.6 0.6 0.6  PROT  --  6.0* 6.3* 6.3* 6.5  ALBUMIN 1.7* 2.0* 2.1* 2.1* 2.2*    CBG: Recent Labs  Lab 08/29/20 0823 08/29/20 1232 08/29/20 1737 08/29/20 2047  08/30/20 0747  GLUCAP 108* 104* 292* 185* 118*     Recent Results (from the past 240 hour(s))  SARS CORONAVIRUS 2 (TAT 6-24 HRS) Nasopharyngeal Nasopharyngeal Swab     Status: Abnormal   Collection Time: 08/24/20  5:16 PM   Specimen: Nasopharyngeal Swab  Result Value Ref Range Status   SARS Coronavirus 2 POSITIVE (A) NEGATIVE Final    Comment: (NOTE) SARS-CoV-2 target nucleic acids are DETECTED.  The SARS-CoV-2 RNA is generally detectable in upper and lower respiratory specimens during the acute phase of infection. Positive results are indicative of the presence of SARS-CoV-2 RNA. Clinical correlation with patient history and other diagnostic information is  necessary to determine patient infection status. Positive results do not rule out bacterial infection or co-infection with other viruses.  The expected result is Negative.  Fact Sheet for Patients: SugarRoll.be  Fact Sheet for Healthcare Providers: https://www.woods-mathews.com/  This test is not yet approved or cleared by the [place] FDA and  has been authorized for detection and/or diagnosis of SARS-CoV-2 by FDA under an Emergency Use Authorization (EUA). This EUA will remain  in effect (meaning this test can be used) for the duration of the COVID-19 declaration under Section 564(b)(1) of the Act, 21 U. S.C. section 360bbb-3(b)(1), unless the authorization is terminated or revoked sooner.   Performed at [facility], [place] [address] [place], [place] [address]       Radiology Studies: No results found.   Scheduled Meds:  calcium carbonate  1,000 mg of elemental calcium Per Tube BID WC   chlorhexidine  15 mL Mouth Rinse BID   Chlorhexidine Gluconate Cloth  6 each Topical Q0600   [START ON 08/31/2020] darbepoetin (ARANESP) injection - DIALYSIS  60 mcg Subcutaneous Q Mon-HD   feeding supplement  237 mL Oral TID BM   finasteride  5 mg Oral Daily   insulin  aspart  0-5 Units Subcutaneous QHS  insulin aspart  0-9 Units Subcutaneous TID WC   Ipratropium-Albuterol  1 puff Inhalation TID   lidocaine  1 patch Transdermal Q24H   magic mouthwash  10 mL Oral TID   mouth rinse  15 mL Mouth Rinse q12n4p   mirtazapine  15 mg Oral QHS   multivitamin with minerals  1 tablet Oral Daily   potassium chloride  40 mEq Oral Once   sodium bicarbonate  1,300 mg Oral BID   tamsulosin  0.4 mg Oral QHS   Continuous Infusions:  sodium chloride Stopped (08/22/20 1544)   lactated ringers 100 mL/hr at 08/29/20 1229     LOS: 19 days   Signature  Lala Lund M.D on 08/30/2020 at 9:44 AM   -  To page go to [email or web address]

## 2020-08-31 ENCOUNTER — Encounter (HOSPITAL_COMMUNITY): Payer: Self-pay | Admitting: Internal Medicine

## 2020-08-31 DIAGNOSIS — N179 Acute kidney failure, unspecified: Secondary | ICD-10-CM | POA: Diagnosis not present

## 2020-08-31 LAB — URINALYSIS, ROUTINE W REFLEX MICROSCOPIC
Bilirubin Urine: NEGATIVE
Bilirubin Urine: NEGATIVE
Glucose, UA: 50 mg/dL — AB
Glucose, UA: NEGATIVE mg/dL
Hgb urine dipstick: NEGATIVE
Hgb urine dipstick: NEGATIVE
Ketones, ur: NEGATIVE mg/dL
Ketones, ur: NEGATIVE mg/dL
Nitrite: NEGATIVE
Nitrite: NEGATIVE
Protein, ur: 30 mg/dL — AB
Protein, ur: 30 mg/dL — AB
Specific Gravity, Urine: 1.015 (ref 1.005–1.030)
Specific Gravity, Urine: 1.015 (ref 1.005–1.030)
WBC, UA: >50 WBC/hpf — ABNORMAL HIGH (ref 0–5)
pH: 5 (ref 5.0–8.0)
pH: 5 (ref 5.0–8.0)

## 2020-08-31 LAB — CBC WITH DIFFERENTIAL/PLATELET
Abs Immature Granulocytes: 0.02 10*3/uL (ref 0.00–0.07)
Basophils Absolute: 0.1 10*3/uL (ref 0.0–0.1)
Basophils Relative: 1 %
Eosinophils Absolute: 0.1 10*3/uL (ref 0.0–0.5)
Eosinophils Relative: 3 %
HCT: 24.2 % — ABNORMAL LOW (ref 39.0–52.0)
Hemoglobin: 8.1 g/dL — ABNORMAL LOW (ref 13.0–17.0)
Immature Granulocytes: 1 %
Lymphocytes Relative: 22 %
Lymphs Abs: 0.9 10*3/uL (ref 0.7–4.0)
MCH: 31.6 pg (ref 26.0–34.0)
MCHC: 33.5 g/dL (ref 30.0–36.0)
MCV: 94.5 fL (ref 80.0–100.0)
Monocytes Absolute: 0.6 10*3/uL (ref 0.1–1.0)
Monocytes Relative: 14 %
Neutro Abs: 2.3 10*3/uL (ref 1.7–7.7)
Neutrophils Relative %: 59 %
Platelets: 109 10*3/uL — ABNORMAL LOW (ref 150–400)
RBC: 2.56 MIL/uL — ABNORMAL LOW (ref 4.22–5.81)
RDW: 16.3 % — ABNORMAL HIGH (ref 11.5–15.5)
WBC: 3.9 10*3/uL — ABNORMAL LOW (ref 4.0–10.5)
nRBC: 0 % (ref 0.0–0.2)

## 2020-08-31 LAB — COMPREHENSIVE METABOLIC PANEL
ALT: 26 U/L (ref 0–44)
AST: 18 U/L (ref 15–41)
Albumin: 2.2 g/dL — ABNORMAL LOW (ref 3.5–5.0)
Alkaline Phosphatase: 45 U/L (ref 38–126)
Anion gap: 10 (ref 5–15)
BUN: 58 mg/dL — ABNORMAL HIGH (ref 8–23)
CO2: 21 mmol/L — ABNORMAL LOW (ref 22–32)
Calcium: 8 mg/dL — ABNORMAL LOW (ref 8.9–10.3)
Chloride: 102 mmol/L (ref 98–111)
Creatinine, Ser: 4.38 mg/dL — ABNORMAL HIGH (ref 0.61–1.24)
GFR, Estimated: 13 mL/min — ABNORMAL LOW (ref >60)
Glucose, Bld: 127 mg/dL — ABNORMAL HIGH (ref 70–99)
Potassium: 3.3 mmol/L — ABNORMAL LOW (ref 3.5–5.1)
Sodium: 133 mmol/L — ABNORMAL LOW (ref 135–145)
Total Bilirubin: 0.7 mg/dL (ref 0.3–1.2)
Total Protein: 6.7 g/dL (ref 6.5–8.1)

## 2020-08-31 LAB — MAGNESIUM: Magnesium: 1.7 mg/dL (ref 1.7–2.4)

## 2020-08-31 LAB — GLUCOSE, CAPILLARY
Glucose-Capillary: 121 mg/dL — ABNORMAL HIGH (ref 70–99)
Glucose-Capillary: 164 mg/dL — ABNORMAL HIGH (ref 70–99)
Glucose-Capillary: 119 mg/dL — ABNORMAL HIGH (ref 70–99)
Glucose-Capillary: 213 mg/dL — ABNORMAL HIGH (ref 70–99)

## 2020-08-31 MED ORDER — POTASSIUM CHLORIDE CRYS ER 20 MEQ PO TBCR
40.0000 meq | EXTENDED_RELEASE_TABLET | Freq: Once | ORAL | Status: AC
  Administered 2020-08-31: 40 meq via ORAL
  Filled 2020-08-31: qty 2

## 2020-08-31 NOTE — Progress Notes (Signed)
Subjective:  no acute events overnight. Good po intake of liquids but not solids.  Sister in law bedside and says ate heavy lunch quickly and now with nausea/emesis.  No frank dysgeusia.  UOP 1.6L from 625mL day prior; no diuretics.    Objective Vital signs in last 24 hours: Vitals:   08/30/20 2129 08/30/20 2347 08/31/20 0340 08/31/20 0800  BP:  132/77 123/76 110/62  Pulse:  (!) 102 (!) 102 (!) 108  Resp:  $Remo'20 20 19  'zZfMC$ Temp:  98.6 F (37 C) 98.7 F (37.1 C) 98.1 F (36.7 C)  TempSrc:  Oral Oral Oral  SpO2: 100% 98% 99% 99%  Weight:      Height:       Weight change:   Intake/Output Summary (Last 24 hours) at 08/31/2020 1020 Last data filed at 08/31/2020 0340 Gross per 24 hour  Intake --  Output 1600 ml  Net -1600 ml    Physical Exam: Gen: nad, awake/alert, sitting in bed HEENT: anicteric sclera, MMM Chest: s1s2, rrr Resp: cta bl Abd: soft, nt/nd Msk; no edema Skin: poor skin turgor Neuro: awake/alert, speech clear and coherent, no tremors/asterixis   Labs: Basic Metabolic Panel: Recent Labs  Lab 08/29/20 0149 08/30/20 0059 08/31/20 0050  NA 132* 134* 133*  K 3.3* 3.4* 3.3*  CL 102 101 102  CO2 21* 21* 21*  GLUCOSE 187* 130* 127*  BUN 63* 59* 58*  CREATININE 4.31* 4.26* 4.38*  CALCIUM 7.3* 7.5* 8.0*    Liver Function Tests: Recent Labs  Lab 08/29/20 0149 08/30/20 0059 08/31/20 0050  AST $Re'22 24 18  'LmO$ ALT $R'26 30 26  'wU$ ALKPHOS 45 45 45  BILITOT 0.6 0.6 0.7  PROT 6.3* 6.5 6.7  ALBUMIN 2.1* 2.2* 2.2*    No results for input(s): LIPASE, AMYLASE in the last 168 hours. No results for input(s): AMMONIA in the last 168 hours.  CBC: Recent Labs  Lab 08/27/20 0142 08/28/20 0054 08/29/20 0149 08/30/20 0059 08/31/20 0050  WBC 3.7* 3.2* 3.8* 3.7* 3.9*  NEUTROABS 2.4 2.0 2.5 2.3 2.3  HGB 7.8* 8.0* 7.8* 8.0* 8.1*  HCT 23.0* 24.1* 22.9* 24.1* 24.2*  MCV 93.1 93.8 92.7 93.4 94.5  PLT 35* 47* 64* 92* 109*    Cardiac Enzymes: No results for input(s): CKTOTAL,  CKMB, CKMBINDEX, TROPONINI in the last 168 hours. CBG: Recent Labs  Lab 08/29/20 2047 08/30/20 0747 08/30/20 1201 08/30/20 2147 08/31/20 0759  GLUCAP 185* 118* 121* 171* 121*     Iron Studies:  No results for input(s): IRON, TIBC, TRANSFERRIN, FERRITIN in the last 72 hours.  Studies/Results: No results found. Medications: Infusions:  sodium chloride Stopped (08/22/20 1544)    Scheduled Medications:  calcium carbonate  1,000 mg of elemental calcium Per Tube BID WC   chlorhexidine  15 mL Mouth Rinse BID   Chlorhexidine Gluconate Cloth  6 each Topical Q0600   darbepoetin (ARANESP) injection - DIALYSIS  60 mcg Subcutaneous Q Mon-HD   feeding supplement  237 mL Oral TID BM   finasteride  5 mg Oral Daily   insulin aspart  0-5 Units Subcutaneous QHS   insulin aspart  0-9 Units Subcutaneous TID WC   Ipratropium-Albuterol  1 puff Inhalation TID   lidocaine  1 patch Transdermal Q24H   magic mouthwash  10 mL Oral TID   mouth rinse  15 mL Mouth Rinse q12n4p   mirtazapine  15 mg Oral QHS   multivitamin with minerals  1 tablet Oral Daily   sodium bicarbonate  1,300 mg Oral BID   tamsulosin  0.4 mg Oral QHS    have reviewed scheduled and prn medications.      Assessment/ Plan: Pt is a 75 y.o. yo male with multiple myeloma who was admitted on 08/11/2020 with COVID-  A on CRT-  crt 1.7 in Jan 2022-  now 7- non oliguric   1. A on CRF-  baseline creat 1.7 in Jan 2022, presented with Cr 7.3. Renal u/s neg for obstruction. UA unrevealing.  Suspect ATN from hypotension/ ACEi.  Myeloma kidney unlikely- per ONC w/u current myeloma testing is negative. Initially pt was refusing dialysis, but then changed his mind. Due to decline in responsiveness and persistent azotemia (creat in 7's), pt was started on CRRT 6/29. Pt became more alert and responsive w/ CRRT. CRRT dc'd on 7/02 when pt was transferred to [facility].   -Cr stable today (likely in plateau phase), has been eating and drinking more.  Volume status better, can hold off on further IVF, No indication for renal replacement therapy as of today, maintain catheter. Waiting for kidney function to settle.  Daily assessment for HD. AM labs ordered. -cont to check daily labs, strict I/O 2. HTN/volume-  no vol excess on exam today. Bp acceptable 3. Anemia-  blood transfusion 6/23, Hb in 7-8 range, ordered darbe 60 ug weekly IV started 7/4. Hgb stable 4. Hypocalcemia-    no sxms -   got bolus during hosp. Monitor albumin or ionized cal, replete prn 5. Hx multiple myeloma - no evidence here of active disease, see ONC notes 6. Pancytopenia - per Hem-Onc 7.  COVID 19 infection - sp course of remdesivir 8. Hyponatremia, mild-stable 9. Metabolic acidosis: nahco3 $RemoveBeforeDE'1300mg'hXUMtrRebxXeFFB$  bid  Sounds like he may discharge tomorrow - will need to show stable renal function and ability to maintain oral intake.  Will need close f/u of labs and with nephrology.   Jannifer Hick MD [facility] Pager [phone]

## 2020-08-31 NOTE — Progress Notes (Signed)
Physical Therapy Treatment Patient Details Name: Jeff Rodriguez MRN: [id number] DOB: 03-17-1945 Today's Date: 08/31/2020    History of Present Illness patient is a 75 year old male who was admitted to the hospital on 6/22 with COVID 19 and AKI; at [facility], had initiated Renal Replacement Therapy, transferred to [facility];  past medical history significant for multiple myeloma, diabetes mellitus, hypertension, asthma, hyperlipidemia, osteoarthritis.    PT Comments    Patient received in bed, lethargic but easily roused. Cognition and general presentation much different today- needed heavier level of cues for safety/sequencing and just appeared much weaker and fatigued in general. Needed MaxA to boost to standing and MinAx2 for all transfers/short distance gait today. Had very cloudy and odd smelling urine in urinal and BSC- RN alerted regarding this in combination with cognitive/behavioral changes. Left up in recliner with all needs met, chair alarm active. Continue to recommend SNF, will continue to follow.     Follow Up Recommendations  SNF     Equipment Recommendations  Rolling walker with 5" wheels;3in1 (PT)    Recommendations for Other Services       Precautions / Restrictions Precautions Precautions: Fall Restrictions Weight Bearing Restrictions: No    Mobility  Bed Mobility Overal bed mobility: Needs Assistance Bed Mobility: Supine to Sit     Supine to sit: Mod assist;HOB elevated     General bed mobility comments: mod assist to elevate trunk and gain balance    Transfers Overall transfer level: Needs assistance Equipment used: 4-wheeled walker Transfers: Sit to/from [employer] Transfers Sit to Stand: Max assist Stand pivot transfers: Min assist;+2 physical assistance       General transfer comment: still needing MaxA to boost to upright standing, much less initiation noted today; able to perform all transfers with MinAX2 today with assist to manage  4WW  Ambulation/Gait Ambulation/Gait assistance: Min assist;+2 physical assistance Gait Distance (Feet): 3 Feet Assistive device: 4-wheeled walker Gait Pattern/deviations: Trunk flexed;Drifts right/left;Step-through pattern;Decreased step length - right;Decreased step length - left;Narrow base of support Gait velocity: decreased   General Gait Details: unable to tolerate more than a few feet of gait training today- very easily fatigued and not following cues well   Stairs             Wheelchair Mobility    Modified Rankin (Stroke Patients Only)       Balance Overall balance assessment: Needs assistance Sitting-balance support: No upper extremity supported Sitting balance-Leahy Scale: Poor Sitting balance - Comments: close min guard for safety and tending to hold onto supportive structures for balance even in sitting   Standing balance support: Bilateral upper extremity supported Standing balance-Leahy Scale: Poor Standing balance comment: reliant on UEs and external support                            Cognition Arousal/Alertness: Awake/alert Behavior During Therapy: WFL for tasks assessed/performed Overall Cognitive Status: Impaired/Different from baseline Area of Impairment: Memory;Safety/judgement;Attention;Following commands;Awareness;Problem solving                   Current Attention Level: Sustained Memory: Decreased short-term memory Following Commands: Follows one step commands inconsistently;Follows one step commands with increased time Safety/Judgement: Decreased awareness of safety;Decreased awareness of deficits Awareness: Intellectual Problem Solving: Slow processing;Decreased initiation;Difficulty sequencing;Requires verbal cues General Comments: cognitively much different today- needed heavier, repeated cues as well as reminders as to what we were doing. Needed help wtih simple tasks such as  holding urinal and locking/unlocking brakes  on rollator      Exercises      General Comments        Pertinent Vitals/Pain Pain Assessment: Faces Faces Pain Scale: No hurt Pain Intervention(s): Limited activity within patient's tolerance;Monitored during session    Home Living                      Prior Function            PT Goals (current goals can now be found in the care plan section) Acute Rehab PT Goals Patient Stated Goal: Go home PT Goal Formulation: With patient Time For Goal Achievement: 09/14/20 Potential to Achieve Goals: Good Progress towards PT goals: Not progressing toward goals - comment (limited by fatigue/confusion today)    Frequency    Min 2X/week      PT Plan Current plan remains appropriate    Co-evaluation              AM-PAC PT "6 Clicks" Mobility   Outcome Measure  Help needed turning from your back to your side while in a flat bed without using bedrails?: A Little Help needed moving from lying on your back to sitting on the side of a flat bed without using bedrails?: A Lot Help needed moving to and from a bed to a chair (including a wheelchair)?: A Lot Help needed standing up from a chair using your arms (e.g., wheelchair or bedside chair)?: A Lot Help needed to walk in hospital room?: Total Help needed climbing 3-5 steps with a railing? : Total 6 Click Score: 11    End of Session Equipment Utilized During Treatment: Gait belt Activity Tolerance: Patient tolerated treatment well;Patient limited by fatigue Patient left: in chair;with call bell/phone within reach;with chair alarm set Nurse Communication: Mobility status;Other (comment) (cloudy urine/patient confusion/pain with urinating) PT Visit Diagnosis: Other abnormalities of gait and mobility (R26.89);Muscle weakness (generalized) (M62.81)     Time: 9093-1121 PT Time Calculation (min) (ACUTE ONLY): 27 min  Charges:  $Therapeutic Activity: 23-37 mins                    Windell Norfolk, DPT, PN1    Supplemental Physical Therapist [place]    Pager [phone] Acute Rehab Office [phone]

## 2020-08-31 NOTE — TOC Progression Note (Addendum)
Transition of Care [facility]) - Progression Note    Patient Details  Name: [name] MRN: [id number] Date of Birth: 1945-03-12  Transition of Care [facility]) CM/SW [place], LCSW Phone Number: 08/31/2020, 9:37 AM  Clinical Narrative:    9:37am-CSW left voicemail for [facility] SNF admissions regarding referral.   2pm-CSW spoke with Melissa at [facility]. She stated she will contact CSW back by the end of the day with bed availability. CSW faxed referral to [facility] (f. [phone]) and left another voicemail.    5pm-CSW contacted Melissa at [facility]. She stated she is not sure when a bed will become available as it is dependent on another resident discharging next week. CSW still unable to get a call back from [facility]. CSW contacted patient's son and requested he pick a SNF from the local accepting list as patient is ready tomorrow for discharge and insurance approval still would need to be initiated once a facility is selected. He expressed understanding and will review the options.     Expected Discharge Plan: Skilled Nursing Facility Barriers to Discharge: SNF Pending bed offer  Expected Discharge Plan and Services Expected Discharge Plan: [place]   Discharge Planning Services: CM Consult Post Acute Care Choice: [place] Living arrangements for the past 2 months: Single Family Home                                       Social Determinants of Health (SDOH) Interventions    Readmission Risk Interventions No flowsheet data found.

## 2020-08-31 NOTE — Progress Notes (Signed)
PROGRESS NOTE    Jeff Rodriguez  FMB:[id number] DOB: 26-Mar-1945 DOA: 08/11/2020 PCP: [facility]    Chief Complaint  Patient presents with   Cough    Brief Narrative:   75 y.o. male with medical history significant of multiple myeloma (on [name]), diabetes, hypertension, asthma, hyperlipidemia and osteoarthritis who was brought in by EMS secondary to productive cough, poor oral intake and not eating much.  Patient is currently undergoing chemo for his multiple myeloma. Tested positive for COVID 19, found to be hypokalemic and  in acute renal failure.  Nephrology team following.  in view of severe thrombocytopenia, oncology consulted and neupogen given. Bone scan 08/18/2020 negative for lytic lesions.  Due to poor prognosis, palliative care team consulted and patient decided for CRRT.  Per nephrology patient was transferred to [facility] for HD.   He has received 2 units of prbc transfusion so far. Pt seen and examined at bedside, no new complaints.    Subjective:  Patient in bed, appears comfortable, denies any headache, no fever, no chest pain or pressure, no shortness of breath , no abdominal pain. No new focal weakness.   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  Multiple myeloma - Dr. Ennever/oncology on board. Bone scan does not show any lytic lesions at this time.  Anemia of chronic disease - Transfuse to keep hemoglobin greater than 7, type screen and monitor done.  Lab Results  Component Value Date   WBC 3.9 (L) 08/31/2020   HGB 8.1 (L) 08/31/2020   HCT 24.2 (L) 08/31/2020   MCV 94.5 08/31/2020   PLT 109 (L) 08/31/2020     Underlying depression and oral intake being poor for several weeks according to the son.  Not suicidal homicidal.  Placed on Remeron and monitor.    Severe thrombocytopenia - No evidence of bleeding at this time.Previous [facility] Oncologist Dr. Marin Olp recommended no platelet transfusion at this time as he is not bleeding.  Had oncology follow-up.  Will defer any transfusions to oncology, platelet counts are gradually improving.  Acute metabolic encephalopathy - Improving , patient is alert answering questions appropriately. Initial CT of the head is negative for acute intracranial abnormalities.TSH, vitamin B12 normal, ammonia level within normal limits, this problem is almost completely resolved.  COVID 19 virus infection. Completed the course of remdesivir, continue with flutter valve and incentive spirometer and inhalers as needed. Therapy evaluations recommending SNF. He is off isolation.  Hypokalemia and hypomagnesemia.  Both replaced.  Hypertension: Well controlled.   Hyperlipidemia - resume home dose statin once acute issues better.  Severe PCM.  He had NG tube with tube feeds, NG accidentally pulled out on 08/26/2020, monitor on oral protein supplements and oral diet.  Type 2 diabetes mellitus -labile sugars due to labile oral intake, monitor on low-dose sliding scale  CBG (last 3)  Recent Labs    08/30/20 1201 08/30/20 2147 08/31/20 0759  GLUCAP 121* 171* 121*      Pressure injury on the back.  Pressure Injury 08/21/20 Coccyx Right;Left;Mid Stage 2 -  Partial thickness loss of dermis presenting as a shallow open injury with a red, pink  wound bed without slough. (Active)  08/21/20 2000  Location: Coccyx  Location Orientation: Right;Left;Mid  Staging: Stage 2 -  Partial thickness loss of dermis presenting as a shallow open injury with a red, pink wound bed without slough.  Wound Description (Comments):   Present on Admission:      Pressure Injury 08/24/20 Coccyx Medial  Stage 2 -  Partial thickness loss of dermis presenting as a shallow open injury with a red, pink wound bed without slough. pink open (Active)  08/24/20 0910  Location: Coccyx  Location Orientation: Medial  Staging: Stage 2 -  Partial thickness loss of dermis presenting as a shallow open injury with a red, pink wound bed without slough.  Wound Description (Comments): pink open  Present on Admission: No   Wound care  will be consulted.    Failure to thrive - Dietary will be consulted, PT -OT   In view of multiple comorbidities, clinical deterioration, hemodialysis dependent, AKI, poor progression palliative care consulted for goals of care discussion.    DVT prophylaxis: SCDs Code Status: DNR - discussed with patient in detail on 08/28/2020 wants to be DNR, does not want a feeding tube Family Communication:  Discussed son [name] [phone] on 08/27/20, 08/28/20 - DNR, sister in law bedside on 08/31/20 Disposition:   Status is: Inpatient  Remains inpatient appropriate because:Ongoing diagnostic testing needed not appropriate for outpatient work up and Unsafe d/c plan  Dispo: The patient is from: Home              Anticipated d/c is to:  pending              Patient currently is not medically stable to d/c.   Difficult to place patient No   Consultants:  Renal, Oncology, Pall Care  Procedures: none.   Antimicrobials:  Antibiotics Given (last 72 hours)     None         Objective: Vitals:   08/30/20 2129 08/30/20 2347 08/31/20 0340 08/31/20 0800  BP:  132/77 123/76 110/62  Pulse:  (!) 102 (!) 102 (!) 108  Resp:  $Remo'20 20 19  'MavlP$ Temp:  98.6 F (37 C) 98.7 F (37.1 C) 98.1 F (36.7 C)  TempSrc:  Oral Oral Oral  SpO2: 100% 98% 99% 99%  Weight:      Height:        Intake/Output Summary (Last 24 hours) at 08/31/2020 1112 Last data filed at 08/31/2020 0340 Gross per 24 hour  Intake --  Output 1600 ml  Net -1600 ml   Filed Weights   08/26/20 0338 08/27/20 0500 08/28/20 0423   Weight: 71 kg 67.5 kg 65.6 kg    Examination:  Awake Alert, No new F.N deficits, Normal affect,   R.IJ HD Cath .AT,PERRAL Supple Neck,No JVD, No cervical lymphadenopathy appriciated.  Symmetrical Chest wall movement, Good air movement bilaterally, CTAB RRR,No Gallops, Rubs or new Murmurs, No Parasternal Heave +ve B.Sounds, Abd Soft, No tenderness, No organomegaly appriciated, No rebound - guarding or rigidity. No Cyanosis, Clubbing or edema, No new Rash or bruise     Data Reviewed: I have personally reviewed following labs and imaging studies  CBC: Recent Labs  Lab 08/27/20 0142 08/28/20 0054 08/29/20 0149 08/30/20 0059 08/31/20 0050  WBC 3.7* 3.2* 3.8* 3.7* 3.9*  NEUTROABS 2.4 2.0 2.5 2.3 2.3  HGB 7.8* 8.0* 7.8* 8.0* 8.1*  HCT 23.0* 24.1* 22.9* 24.1* 24.2*  MCV 93.1 93.8 92.7 93.4 94.5  PLT 35* 47* 64* 92* 109*  Basic Metabolic Panel: Recent Labs  Lab 08/27/20 0142 08/28/20 0054 08/29/20 0149 08/30/20 0059 08/31/20 0050  NA 131* 131* 132* 134* 133*  K 3.4* 3.7 3.3* 3.4* 3.3*  CL 104 103 102 101 102  CO2 18* 20* 21* 21* 21*  GLUCOSE 118* 154* 187* 130* 127*  BUN 72* 66* 63* 59* 58*  CREATININE 3.97* 3.97* 4.31* 4.26* 4.38*  CALCIUM 6.8* 7.2* 7.3* 7.5* 8.0*  MG 2.2 1.9 2.0 1.8 1.7    GFR: Estimated Creatinine Clearance: 13.7 mL/min (A) (by C-G formula based on SCr of 4.38 mg/dL (H)).  Liver Function Tests: Recent Labs  Lab 08/27/20 0142 08/28/20 0054 08/29/20 0149 08/30/20 0059 08/31/20 0050  AST $Re'21 22 22 24 18  'mPn$ ALT $R'26 26 26 30 26  'Eg$ ALKPHOS 42 42 45 45 45  BILITOT 0.7 0.6 0.6 0.6 0.7  PROT 6.0* 6.3* 6.3* 6.5 6.7  ALBUMIN 2.0* 2.1* 2.1* 2.2* 2.2*    CBG: Recent Labs  Lab 08/29/20 2047 08/30/20 0747 08/30/20 1201 08/30/20 2147 08/31/20 0759  GLUCAP 185* 118* 121* 171* 121*     Recent Results (from the past 240 hour(s))  SARS CORONAVIRUS 2 (TAT 6-24 HRS) Nasopharyngeal Nasopharyngeal Swab     Status: Abnormal   Collection Time:  08/24/20  5:16 PM   Specimen: Nasopharyngeal Swab  Result Value Ref Range Status   SARS Coronavirus 2 POSITIVE (A) NEGATIVE Final    Comment: (NOTE) SARS-CoV-2 target nucleic acids are DETECTED.  The SARS-CoV-2 RNA is generally detectable in upper and lower respiratory specimens during the acute phase of infection. Positive results are indicative of the presence of SARS-CoV-2 RNA. Clinical correlation with patient history and other diagnostic information is  necessary to determine patient infection status. Positive results do not rule out bacterial infection or co-infection with other viruses.  The expected result is Negative.  Fact Sheet for Patients: SugarRoll.be  Fact Sheet for Healthcare Providers: https://www.woods-mathews.com/  This test is not yet approved or cleared by the [place] FDA and  has been authorized for detection and/or diagnosis of SARS-CoV-2 by FDA under an Emergency Use Authorization (EUA). This EUA will remain  in effect (meaning this test can be used) for the duration of the COVID-19 declaration under Section 564(b)(1) of the Act, 21 U. S.C. section 360bbb-3(b)(1), unless the authorization is terminated or revoked sooner.   Performed at [facility], [place] [address] [place], [place] [address]       Radiology Studies: No results found.   Scheduled Meds:  calcium carbonate  1,000 mg of elemental calcium Per Tube BID WC   chlorhexidine  15 mL Mouth Rinse BID   Chlorhexidine Gluconate Cloth  6 each Topical Q0600   darbepoetin (ARANESP) injection - DIALYSIS  60 mcg Subcutaneous Q Mon-HD   feeding supplement  237 mL Oral TID BM   finasteride  5 mg Oral Daily   insulin aspart  0-5 Units Subcutaneous QHS   insulin aspart  0-9 Units Subcutaneous TID WC   Ipratropium-Albuterol  1 puff Inhalation TID   lidocaine  1 patch Transdermal Q24H   magic mouthwash  10 mL Oral TID   mouth rinse  15 mL Mouth  Rinse q12n4p   mirtazapine  15 mg Oral QHS   multivitamin with minerals  1 tablet Oral Daily   sodium bicarbonate  1,300 mg Oral BID   tamsulosin  0.4 mg Oral QHS   Continuous Infusions:  sodium chloride Stopped (08/22/20 1544)     LOS:  20 days   Signature  Lala Lund M.D on 08/31/2020 at 11:12 AM   -  To page go to [email or web address]

## 2020-09-01 DIAGNOSIS — N179 Acute kidney failure, unspecified: Secondary | ICD-10-CM | POA: Diagnosis not present

## 2020-09-01 DIAGNOSIS — N1832 Chronic kidney disease, stage 3b: Secondary | ICD-10-CM | POA: Diagnosis not present

## 2020-09-01 LAB — COMPREHENSIVE METABOLIC PANEL
ALT: 28 U/L (ref 0–44)
AST: 27 U/L (ref 15–41)
Albumin: 2.1 g/dL — ABNORMAL LOW (ref 3.5–5.0)
Alkaline Phosphatase: 41 U/L (ref 38–126)
Anion gap: 11 (ref 5–15)
BUN: 58 mg/dL — ABNORMAL HIGH (ref 8–23)
CO2: 21 mmol/L — ABNORMAL LOW (ref 22–32)
Calcium: 8.1 mg/dL — ABNORMAL LOW (ref 8.9–10.3)
Chloride: 101 mmol/L (ref 98–111)
Creatinine, Ser: 4.39 mg/dL — ABNORMAL HIGH (ref 0.61–1.24)
GFR, Estimated: 13 mL/min — ABNORMAL LOW (ref >60)
Glucose, Bld: 126 mg/dL — ABNORMAL HIGH (ref 70–99)
Potassium: 3.8 mmol/L (ref 3.5–5.1)
Sodium: 133 mmol/L — ABNORMAL LOW (ref 135–145)
Total Bilirubin: 0.5 mg/dL (ref 0.3–1.2)
Total Protein: 6.3 g/dL — ABNORMAL LOW (ref 6.5–8.1)

## 2020-09-01 LAB — GLUCOSE, CAPILLARY
Glucose-Capillary: 137 mg/dL — ABNORMAL HIGH (ref 70–99)
Glucose-Capillary: 130 mg/dL — ABNORMAL HIGH (ref 70–99)
Glucose-Capillary: 133 mg/dL — ABNORMAL HIGH (ref 70–99)
Glucose-Capillary: 162 mg/dL — ABNORMAL HIGH (ref 70–99)

## 2020-09-01 LAB — CBC WITH DIFFERENTIAL/PLATELET
Abs Immature Granulocytes: 0.03 10*3/uL (ref 0.00–0.07)
Basophils Absolute: 0.1 10*3/uL (ref 0.0–0.1)
Basophils Relative: 1 %
Eosinophils Absolute: 0.1 10*3/uL (ref 0.0–0.5)
Eosinophils Relative: 3 %
HCT: 26.7 % — ABNORMAL LOW (ref 39.0–52.0)
Hemoglobin: 8.9 g/dL — ABNORMAL LOW (ref 13.0–17.0)
Immature Granulocytes: 1 %
Lymphocytes Relative: 28 %
Lymphs Abs: 1.1 10*3/uL (ref 0.7–4.0)
MCH: 31.3 pg (ref 26.0–34.0)
MCHC: 33.3 g/dL (ref 30.0–36.0)
MCV: 94 fL (ref 80.0–100.0)
Monocytes Absolute: 0.6 10*3/uL (ref 0.1–1.0)
Monocytes Relative: 15 %
Neutro Abs: 2 10*3/uL (ref 1.7–7.7)
Neutrophils Relative %: 52 %
Platelets: 121 10*3/uL — ABNORMAL LOW (ref 150–400)
RBC: 2.84 MIL/uL — ABNORMAL LOW (ref 4.22–5.81)
RDW: 16 % — ABNORMAL HIGH (ref 11.5–15.5)
WBC: 3.8 10*3/uL — ABNORMAL LOW (ref 4.0–10.5)
nRBC: 0 % (ref 0.0–0.2)

## 2020-09-01 LAB — MAGNESIUM: Magnesium: 1.8 mg/dL (ref 1.7–2.4)

## 2020-09-01 MED ORDER — TRAMADOL HCL 50 MG PO TABS
50.0000 mg | ORAL_TABLET | Freq: Two times a day (BID) | ORAL | Status: DC | PRN
  Administered 2020-09-01 – 2020-09-11 (×3): 50 mg via ORAL
  Filled 2020-09-01 (×3): qty 1

## 2020-09-01 MED ORDER — BACLOFEN 10 MG PO TABS
5.0000 mg | ORAL_TABLET | Freq: Once | ORAL | Status: AC
  Administered 2020-09-01: 5 mg via ORAL
  Filled 2020-09-01: qty 1

## 2020-09-01 MED ORDER — FOSFOMYCIN TROMETHAMINE 3 G PO PACK
3.0000 g | PACK | Freq: Once | ORAL | Status: AC
  Administered 2020-09-01: 3 g via ORAL
  Filled 2020-09-01: qty 3

## 2020-09-01 NOTE — Progress Notes (Signed)
  Speech Language Pathology Treatment: Dysphagia  Patient Details Name: Jeff Rodriguez MRN: [id number] DOB: 06/28/1945 Today's Date: 09/01/2020 Time: 1340-1400 SLP Time Calculation (min) (ACUTE ONLY): 20 min  Assessment / Plan / Recommendation Clinical Impression  SLP followed up for dysphagia management. Per RN, pt, and pts family pt continues to report episodic dysphagia (noted with larger PO meds; necessitating crushing) also reported with some solid PO. Pt states he does not have adequate dentition for mastication of all PO. Some missing lower dentition noted. Favor pt ordering preference items vs diet modification as PO consumption remains decreased overall due to pt reported dysgeusia. Assessed pt during PO med administration with nursing (small tablet with thins noted to have some brief oral grimacing due to pt distaste) and pt belching. Pt required moderate encouragement for assessment of regular snack and thin liquids. Discussed possible strategies for use including effortful swallow with hopes for improved swallow efficiency as pt states some solids feel like they don't go down. Pt able to implement effortful swallow with cues. Provided written handout with safe swallowing strategies that clinically suspected to improve swallow safety and efficiency. Will continue to follow to mitigate pts chronic dysphagia, future instrumental assessment may be beneficial if within goals of care if any pt difficulty persists despite strategies.     HPI HPI: patient is a 75 year old male who was admitted to the hospital on 6/22 with COVID 19 and AKI; at [facility], had initiated Renal Replacement Therapy, transferred to [facility];  past medical history significant for multiple myeloma, diabetes mellitus, hypertension, asthma, hyperlipidemia, osteoarthritis.      SLP Plan  Continue with current plan of care       Recommendations  Diet recommendations: Regular;Thin liquid Liquids provided via:  Cup;Straw Medication Administration: Other (Comment) (crush larger PO meds, smaller PO meds as tolerated) Supervision: Patient able to self feed;Intermittent supervision to cue for compensatory strategies Compensations: Slow rate;Small sips/bites;Effortful swallow;Follow solids with liquid Postural Changes and/or Swallow Maneuvers: Seated upright 90 degrees;Upright 30-60 min after meal                Oral Care Recommendations: Oral care BID Follow up Recommendations: None SLP Visit Diagnosis: Dysphagia, unspecified (R13.10) Plan: Continue with current plan of care       GO                Jeff Rasmussen MA, CCC-SLP Acute Rehabilitation Services   09/01/2020, 2:03 PM

## 2020-09-01 NOTE — TOC Progression Note (Addendum)
Transition of Care [facility]) - Progression Note    Patient Details  Name: Jeff Rodriguez MRN: [id number] Date of Birth: 1945-03-23  Transition of Care [facility]) CM/SW [place], [place] Phone Number: 09/01/2020, 9:16 AM  Clinical Narrative:     CSW called pt son regarding SNF choice. Son explains he would accept either [facility] or [facility] who are on [place] list of SNF facilities he was given. CSW will follow up with these facilities. Son states that pt has actually had 4 covid vaccinations.   CSW contacted [facility]; they don't have unvaccinated beds open but may have vaccinated beds open.  CSW called [facility]; they do accept [facility] contract but have to review pt further.   0900: Son called CSW back and explained that a facility called [facility] in [place] and told him they have beds available. He provided fax number for admissions liaison there, Linus Orn. [phone]. CSW agreed to fax referral though also explained that a SNF choice from facilities that have already accepted would need to be made as pt cannot be held at hospital once medically ready when there are safe discharge options to available snfs. Clinicals Faxed.   1123: Short term rehab paperwork and clinicals submitted to [place] for auth at fax [phone]  1411: CSW received call from an RN with [facility], Mrs. [name], for update on pt. CSW provided update on disposition. She informed CSW that SNF contract with [facility] through the [place] was previously requested and approved for 30 days. She provides pt's [facility] PCP information PCP is Dr. Alferd Patee and pt's Social Worker is Pensions consultant (pronounced shah-kelly) [place] ext. [phone]   CSW attempted to call [place] at this time to inquire about possible admission; no answer and no voicemail available.   1538: Received call from [place] at [facility] from [phone]. She stated she may be able to accept pt; had  questions about pt's VA insurance number. CSW refaxed face sheet with info to [phone]  Expected Discharge Plan: [place] Barriers to Discharge: SNF Pending bed offer  Expected Discharge Plan and Services Expected Discharge Plan: [place]   Discharge Planning Services: CM Consult Post Acute Care Choice: [place] Living arrangements for the past 2 months: Single Family Home                                       Social Determinants of Health (SDOH) Interventions    Readmission Risk Interventions No flowsheet data found.

## 2020-09-01 NOTE — Progress Notes (Signed)
Subjective:  no acute events overnight. Son bedside - long discussion. No frank dysgeusia but hasn't had great po intake even predating admission by months.  UOP 0.8L from 1.6L  day prior; no diuretics.   Likely to SNF soon.    Objective Vital signs in last 24 hours: Vitals:   09/01/20 0015 09/01/20 0425 09/01/20 0500 09/01/20 0734  BP: 125/90 122/73  106/66  Pulse: 100 100  (!) 109  Resp: $Remo'18 18  17  'YCubr$ Temp: 98.4 F (36.9 C) 98.3 F (36.8 C)  97.7 F (36.5 C)  TempSrc: Oral Oral  Oral  SpO2: 99% 98%  99%  Weight:   62.3 kg   Height:       Weight change:   Intake/Output Summary (Last 24 hours) at 09/01/2020 1153 Last data filed at 09/01/2020 0525 Gross per 24 hour  Intake 480 ml  Output 800 ml  Net -320 ml    Physical Exam: Gen: nad, sleepy HEENT: anicteric sclera, MMM Chest: s1s2, rrr Resp: cta bl Abd: soft, nt/nd Msk; no edema Skin: poor skin turgor Neuro: no tremors/asterixis   Labs: Basic Metabolic Panel: Recent Labs  Lab 08/30/20 0059 08/31/20 0050 09/01/20 0139  NA 134* 133* 133*  K 3.4* 3.3* 3.8  CL 101 102 101  CO2 21* 21* 21*  GLUCOSE 130* 127* 126*  BUN 59* 58* 58*  CREATININE 4.26* 4.38* 4.39*  CALCIUM 7.5* 8.0* 8.1*    Liver Function Tests: Recent Labs  Lab 08/30/20 0059 08/31/20 0050 09/01/20 0139  AST $Re'24 18 27  'GOU$ ALT $R'30 26 28  'PM$ ALKPHOS 45 45 41  BILITOT 0.6 0.7 0.5  PROT 6.5 6.7 6.3*  ALBUMIN 2.2* 2.2* 2.1*    No results for input(s): LIPASE, AMYLASE in the last 168 hours. No results for input(s): AMMONIA in the last 168 hours.  CBC: Recent Labs  Lab 08/28/20 0054 08/29/20 0149 08/30/20 0059 08/31/20 0050 09/01/20 0139  WBC 3.2* 3.8* 3.7* 3.9* 3.8*  NEUTROABS 2.0 2.5 2.3 2.3 2.0  HGB 8.0* 7.8* 8.0* 8.1* 8.9*  HCT 24.1* 22.9* 24.1* 24.2* 26.7*  MCV 93.8 92.7 93.4 94.5 94.0  PLT 47* 64* 92* 109* 121*    Cardiac Enzymes: No results for input(s): CKTOTAL, CKMB, CKMBINDEX, TROPONINI in the last 168 hours. CBG: Recent Labs   Lab 08/31/20 0759 08/31/20 1224 08/31/20 1610 08/31/20 2028 09/01/20 0737  GLUCAP 121* 164* 119* 213* 137*     Iron Studies:  No results for input(s): IRON, TIBC, TRANSFERRIN, FERRITIN in the last 72 hours.  Studies/Results: No results found. Medications: Infusions:  sodium chloride Stopped (08/22/20 1544)    Scheduled Medications:  baclofen  5 mg Oral Once   calcium carbonate  1,000 mg of elemental calcium Per Tube BID WC   chlorhexidine  15 mL Mouth Rinse BID   Chlorhexidine Gluconate Cloth  6 each Topical Q0600   darbepoetin (ARANESP) injection - DIALYSIS  60 mcg Subcutaneous Q Mon-HD   feeding supplement  237 mL Oral TID BM   finasteride  5 mg Oral Daily   fosfomycin  3 g Oral Once   insulin aspart  0-5 Units Subcutaneous QHS   insulin aspart  0-9 Units Subcutaneous TID WC   Ipratropium-Albuterol  1 puff Inhalation TID   lidocaine  1 patch Transdermal Q24H   magic mouthwash  10 mL Oral TID   mouth rinse  15 mL Mouth Rinse q12n4p   mirtazapine  15 mg Oral QHS   multivitamin with minerals  1 tablet  Oral Daily   sodium bicarbonate  1,300 mg Oral BID   tamsulosin  0.4 mg Oral QHS    have reviewed scheduled and prn medications.      Assessment/ Plan: Pt is a 75 y.o. yo male with multiple myeloma who was admitted on 08/11/2020 with COVID-  A on CRT-  crt 1.7 in Jan 2022-  now 7- non oliguric   1. A on CRF-  baseline creat 1.7 in Jan 2022, presented with Cr 7.3. Renal u/s neg for obstruction. UA unrevealing.  Suspect ATN from hypotension/ ACEi.  Myeloma kidney unlikely- per ONC w/u current myeloma testing is negative. Initially pt was refusing dialysis, but then changed his mind. Due to decline in responsiveness and persistent azotemia (creat in 7's), pt was started on CRRT 6/29. Pt became more alert and responsive w/ CRRT. CRRT dc'd on 7/02 when pt was transferred to [facility].    -Cr stable today (likely in plateau phase).  No indication for renal replacement therapy as  of today, but would still maintain catheter. Waiting for kidney function to settle anticipating it should improve further from here with some time provided we avoid further insults. The main risk for worsening renal function seems to be hypovolemia.   -I encouraged the son to help him eat and drink - if he cannot maintain oral intake at the expense of kidney function he'd need to be dialysis dependent which I don't think would provide him much quality of life or he would need to do comfort care.   -I think it's reasonable to discharge him when a SNF bed is available with close monitoring of labs and nephrology f/u.  Let me know if discharge is imminent. -cont to check daily labs, strict I/O 2. HTN/volume-  no vol excess on exam today. Bp acceptable 3. Anemia-  blood transfusion 6/23, Hb in 8s, ordered darbe 60 ug weekly IV started 7/4. Hgb stable 4. Hypocalcemia-    no sxms -   got bolus during hosp. Monitor albumin or ionized cal, replete prn 5. Hx multiple myeloma - no evidence here of active disease, see ONC notes 6. Pancytopenia - per Hem-Onc 7.  COVID 19 infection - sp course of remdesivir 8. Hyponatremia, mild-stable 9. Metabolic acidosis: nahco3 $RemoveBeforeDE'1300mg'FLJwZZSposrCLUa$  bid   Jannifer Hick MD [facility] Pager [phone]

## 2020-09-01 NOTE — Progress Notes (Addendum)
PROGRESS NOTE    Jeff Rodriguez  ZWC:[id number] DOB: 1945-07-02 DOA: 08/11/2020 PCP: [facility]    Chief Complaint  Patient presents with   Cough    Brief Narrative:   75 y.o. male with medical history significant of multiple myeloma (on [name]), diabetes, hypertension, asthma, hyperlipidemia and osteoarthritis who was brought in by EMS secondary to productive cough, poor oral intake and not eating much.  Patient is currently undergoing chemo for his multiple myeloma. Tested positive for COVID 19, found to be hypokalemic and  in acute renal failure.  Nephrology team following.  in view of severe thrombocytopenia, oncology consulted and neupogen given. Bone scan 08/18/2020 negative for lytic lesions.  Due to poor prognosis, palliative care team consulted and patient decided for CRRT.  Per nephrology patient was transferred to [facility] for HD.   He has received 2 units of prbc transfusion so far.   His anemia, thrombocytopenia and renal function have all stabilized, his main issue was poor oral intake and not doing well at home, with addition of Remeron his oral intake has improved, medically now much improved, will require SNF.     Subjective:  Patient in bed, appears comfortable, denies any headache, no fever, no chest pain or pressure, no shortness of breath , no abdominal pain. No new focal weakness.  He did not sleep in a good position yesterday and today having some muscle cramps under his right shoulder blade.    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  He has a right IJ HD catheter prior to discharge  needs to be discontinued if renal function remains stable.  Multiple myeloma - Dr. Ennever/oncology on board. Bone scan does not show any lytic lesions at this time.  Anemia of chronic disease - Transfuse to keep hemoglobin greater than 7, type screen and monitor done.  Counts much improved.  Lab Results  Component Value Date   WBC 3.8 (L) 09/01/2020   HGB 8.9 (L) 09/01/2020   HCT 26.7 (L) 09/01/2020   MCV 94.0 09/01/2020   PLT 121 (L) 09/01/2020    Underlying depression and oral intake being poor for several weeks according to the son.  Not suicidal homicidal.  Placed on Remeron and monitor.    Severe thrombocytopenia - due to multiple myeloma counts much improved.  Acute metabolic encephalopathy - Improving , patient is alert answering questions appropriately. Initial CT of the head is negative for acute intracranial abnormalities.TSH, vitamin B12 normal, ammonia level within normal limits, this problem is almost completely resolved.  COVID 19 virus infection. Completed the course of remdesivir, continue with flutter valve and incentive spirometer and inhalers as needed. Therapy evaluations recommending SNF. He is off isolation.  Hypokalemia and hypomagnesemia.  Both replaced.  Hypertension: Well controlled.   Hyperlipidemia - resume home dose statin once acute issues better.  Severe PCM.  He had NG tube with tube feeds, NG accidentally pulled out on 08/26/2020, monitor on oral protein supplements and oral diet.  Right shoulder blade muscle cramps.  Tramadol and 1 dose of baclofen.   Dysuria 08/31/20 - UA looks dirty, get UC,  1 dose Fosfomycin and monitor.  Type 2 diabetes mellitus -labile sugars due to labile oral intake, monitor on low-dose sliding scale  CBG (last 3)  Recent Labs    08/31/20 1610 08/31/20 2028 09/01/20 0737  GLUCAP 119* 213* 137*      Pressure injury on the back.  Pressure Injury 08/21/20 Coccyx Right;Left;Mid Stage 2 -  Partial thickness loss of  dermis presenting as a shallow open injury with a red, pink wound bed without slough. (Active)  08/21/20 2000  Location: Coccyx  Location Orientation: Right;Left;Mid  Staging: Stage 2 -  Partial thickness loss of dermis presenting as a shallow open injury with a red, pink wound bed without slough.  Wound Description (Comments):   Present on Admission:      Pressure Injury 08/24/20 Coccyx Medial Stage 2 -  Partial thickness loss of dermis presenting as a shallow open injury with a red, pink wound bed without slough. pink open (Active)  08/24/20 0910  Location: Coccyx  Location Orientation: Medial  Staging: Stage 2 -  Partial thickness loss of dermis presenting as a shallow open injury with a red, pink wound bed without slough.  Wound Description (Comments): pink open  Present on Admission: No   Wound care  will be consulted.    Failure to thrive - Dietary will be consulted, PT -OT   In view of multiple comorbidities, clinical deterioration, hemodialysis dependent, AKI, poor progression palliative care consulted for goals of care discussion.    DVT prophylaxis: SCDs Code Status: DNR - discussed with patient in detail on 08/28/2020 wants to be DNR, does not want a feeding tube Family Communication:  Discussed son [name] [phone] on 08/27/20, 08/28/20 - DNR, sister in law bedside on 08/31/20 Disposition:   Status is: Inpatient  Remains inpatient appropriate because:Ongoing diagnostic testing needed not appropriate for outpatient work up and Unsafe d/c plan  Dispo: The patient is from: Home              Anticipated d/c is to:  pending              Patient currently is not medically stable to d/c.   Difficult to place patient No   Consultants:  Renal, Oncology, Pall Care  Procedures: none.   Antimicrobials:  Antibiotics Given (last 72 hours)     None         Objective: Vitals:   09/01/20 0015 09/01/20 0425 09/01/20 0500 09/01/20 0734  BP: 125/90 122/73  106/66  Pulse:  100 100  (!) 109  Resp: $Remo'18 18  17  'Rtsoa$ Temp: 98.4 F (36.9 C) 98.3 F (36.8 C)  97.7 F (36.5 C)  TempSrc: Oral Oral  Oral  SpO2: 99% 98%  99%  Weight:   62.3 kg   Height:        Intake/Output Summary (Last 24 hours) at 09/01/2020 1025 Last data filed at 09/01/2020 0525 Gross per 24 hour  Intake 480 ml  Output 800 ml  Net -320 ml   Filed Weights   08/27/20 0500 08/28/20 0423 09/01/20 0500  Weight: 67.5 kg 65.6 kg 62.3 kg    Examination:  Awake Alert, No new F.N deficits, Normal affect,   R.IJ HD Cath [place].AT,PERRAL Supple Neck,No JVD, No cervical lymphadenopathy appriciated.  Symmetrical Chest wall movement, Good air movement bilaterally, CTAB RRR,No Gallops, Rubs or new Murmurs, No Parasternal Heave +ve B.Sounds, Abd Soft, No tenderness, No organomegaly appriciated, No rebound - guarding or rigidity. No Cyanosis, Clubbing or edema, No  new Rash or bruise     Data Reviewed: I have personally reviewed following labs and imaging studies  CBC: Recent Labs  Lab 08/28/20 0054 08/29/20 0149 08/30/20 0059 08/31/20 0050 09/01/20 0139  WBC 3.2* 3.8* 3.7* 3.9* 3.8*  NEUTROABS 2.0 2.5 2.3 2.3 2.0  HGB 8.0* 7.8* 8.0* 8.1* 8.9*  HCT 24.1* 22.9* 24.1* 24.2* 26.7*  MCV 93.8 92.7 93.4 94.5 94.0  PLT 47* 64* 92* 109* 121*    Basic Metabolic Panel: Recent Labs  Lab 08/28/20 0054 08/29/20 0149 08/30/20 0059 08/31/20 0050 09/01/20 0139  NA 131* 132* 134* 133* 133*  K 3.7 3.3* 3.4* 3.3* 3.8  CL 103 102 101 102 101  CO2 20* 21* 21* 21* 21*  GLUCOSE 154* 187* 130* 127* 126*  BUN 66* 63* 59* 58* 58*  CREATININE 3.97* 4.31* 4.26* 4.38* 4.39*  CALCIUM 7.2* 7.3* 7.5* 8.0* 8.1*  MG 1.9 2.0 1.8 1.7 1.8    GFR: Estimated Creatinine Clearance: 13 mL/min (A) (by C-G formula based on SCr of 4.39 mg/dL (H)).  Liver Function Tests: Recent Labs  Lab 08/28/20 0054 08/29/20 0149 08/30/20 0059 08/31/20 0050 09/01/20 0139  AST $Re'22 22 24 18 27  'bJo$ ALT $R'26 26 30 26 28  'PX$ ALKPHOS 42 45 45  45 41  BILITOT 0.6 0.6 0.6 0.7 0.5  PROT 6.3* 6.3* 6.5 6.7 6.3*  ALBUMIN 2.1* 2.1* 2.2* 2.2* 2.1*    CBG: Recent Labs  Lab 08/31/20 0759 08/31/20 1224 08/31/20 1610 08/31/20 2028 09/01/20 0737  GLUCAP 121* 164* 119* 213* 137*     Recent Results (from the past 240 hour(s))  SARS CORONAVIRUS 2 (TAT 6-24 HRS) Nasopharyngeal Nasopharyngeal Swab     Status: Abnormal   Collection Time: 08/24/20  5:16 PM   Specimen: Nasopharyngeal Swab  Result Value Ref Range Status   SARS Coronavirus 2 POSITIVE (A) NEGATIVE Final    Comment: (NOTE) SARS-CoV-2 target nucleic acids are DETECTED.  The SARS-CoV-2 RNA is generally detectable in upper and lower respiratory specimens during the acute phase of infection. Positive results are indicative of the presence of SARS-CoV-2 RNA. Clinical correlation with patient history and other diagnostic information is  necessary to determine patient infection status. Positive results do not rule out bacterial infection or co-infection with other viruses.  The expected result is Negative.  Fact Sheet for Patients: SugarRoll.be  Fact Sheet for Healthcare Providers: https://www.woods-mathews.com/  This test is not yet approved or cleared by the [place] FDA and  has been authorized for detection and/or diagnosis of SARS-CoV-2 by FDA under an Emergency Use Authorization (EUA). This EUA will remain  in effect (meaning this test can be used) for the duration of the COVID-19 declaration under Section 564(b)(1) of the Act, 21 U. S.C. section 360bbb-3(b)(1), unless the authorization is terminated or revoked sooner.   Performed at [facility], [place] [address] [place], [place] [address]       Radiology Studies: No results found.   Scheduled Meds:  calcium carbonate  1,000 mg of elemental calcium Per Tube BID WC   chlorhexidine  15 mL Mouth Rinse BID   Chlorhexidine Gluconate Cloth  6 each Topical  Q0600   darbepoetin (ARANESP) injection - DIALYSIS  60 mcg Subcutaneous Q Mon-HD   feeding supplement  237 mL Oral TID BM   finasteride  5 mg Oral Daily   insulin aspart  0-5 Units Subcutaneous QHS   insulin aspart  0-9 Units Subcutaneous TID WC   Ipratropium-Albuterol  1  puff Inhalation TID   lidocaine  1 patch Transdermal Q24H   magic mouthwash  10 mL Oral TID   mouth rinse  15 mL Mouth Rinse q12n4p   mirtazapine  15 mg Oral QHS   multivitamin with minerals  1 tablet Oral Daily   sodium bicarbonate  1,300 mg Oral BID   tamsulosin  0.4 mg Oral QHS   Continuous Infusions:  sodium chloride Stopped (08/22/20 1544)     LOS: 21 days   Signature  Lala Lund M.D on 09/01/2020 at 10:25 AM   -  To page go to [email or web address]

## 2020-09-02 ENCOUNTER — Inpatient Hospital Stay (HOSPITAL_COMMUNITY): Payer: No Typology Code available for payment source

## 2020-09-02 DIAGNOSIS — I10 Essential (primary) hypertension: Secondary | ICD-10-CM | POA: Diagnosis not present

## 2020-09-02 DIAGNOSIS — C9000 Multiple myeloma not having achieved remission: Secondary | ICD-10-CM | POA: Diagnosis not present

## 2020-09-02 DIAGNOSIS — R627 Adult failure to thrive: Secondary | ICD-10-CM

## 2020-09-02 DIAGNOSIS — N179 Acute kidney failure, unspecified: Secondary | ICD-10-CM | POA: Diagnosis not present

## 2020-09-02 LAB — CBC WITH DIFFERENTIAL/PLATELET
Abs Immature Granulocytes: 0.02 10*3/uL (ref 0.00–0.07)
Basophils Absolute: 0.1 10*3/uL (ref 0.0–0.1)
Basophils Relative: 2 %
Eosinophils Absolute: 0.2 10*3/uL (ref 0.0–0.5)
Eosinophils Relative: 3 %
HCT: 26 % — ABNORMAL LOW (ref 39.0–52.0)
Hemoglobin: 8.5 g/dL — ABNORMAL LOW (ref 13.0–17.0)
Immature Granulocytes: 0 %
Lymphocytes Relative: 22 %
Lymphs Abs: 1.1 10*3/uL (ref 0.7–4.0)
MCH: 30.9 pg (ref 26.0–34.0)
MCHC: 32.7 g/dL (ref 30.0–36.0)
MCV: 94.5 fL (ref 80.0–100.0)
Monocytes Absolute: 0.9 10*3/uL (ref 0.1–1.0)
Monocytes Relative: 16 %
Neutro Abs: 3 10*3/uL (ref 1.7–7.7)
Neutrophils Relative %: 57 %
Platelets: 174 10*3/uL (ref 150–400)
RBC: 2.75 MIL/uL — ABNORMAL LOW (ref 4.22–5.81)
RDW: 16.1 % — ABNORMAL HIGH (ref 11.5–15.5)
WBC: 5.3 10*3/uL (ref 4.0–10.5)
nRBC: 0 % (ref 0.0–0.2)

## 2020-09-02 LAB — COMPREHENSIVE METABOLIC PANEL
ALT: 35 U/L (ref 0–44)
AST: 31 U/L (ref 15–41)
Albumin: 2.3 g/dL — ABNORMAL LOW (ref 3.5–5.0)
Alkaline Phosphatase: 57 U/L (ref 38–126)
Anion gap: 9 (ref 5–15)
BUN: 56 mg/dL — ABNORMAL HIGH (ref 8–23)
CO2: 23 mmol/L (ref 22–32)
Calcium: 8.5 mg/dL — ABNORMAL LOW (ref 8.9–10.3)
Chloride: 100 mmol/L (ref 98–111)
Creatinine, Ser: 4.48 mg/dL — ABNORMAL HIGH (ref 0.61–1.24)
GFR, Estimated: 13 mL/min — ABNORMAL LOW (ref >60)
Glucose, Bld: 129 mg/dL — ABNORMAL HIGH (ref 70–99)
Potassium: 3.7 mmol/L (ref 3.5–5.1)
Sodium: 132 mmol/L — ABNORMAL LOW (ref 135–145)
Total Bilirubin: 0.5 mg/dL (ref 0.3–1.2)
Total Protein: 6.9 g/dL (ref 6.5–8.1)

## 2020-09-02 LAB — GLUCOSE, CAPILLARY
Glucose-Capillary: 143 mg/dL — ABNORMAL HIGH (ref 70–99)
Glucose-Capillary: 123 mg/dL — ABNORMAL HIGH (ref 70–99)
Glucose-Capillary: 193 mg/dL — ABNORMAL HIGH (ref 70–99)
Glucose-Capillary: 238 mg/dL — ABNORMAL HIGH (ref 70–99)

## 2020-09-02 LAB — MAGNESIUM: Magnesium: 1.7 mg/dL (ref 1.7–2.4)

## 2020-09-02 MED ORDER — SENNOSIDES-DOCUSATE SODIUM 8.6-50 MG PO TABS
1.0000 | ORAL_TABLET | Freq: Two times a day (BID) | ORAL | Status: DC
  Administered 2020-09-02 – 2020-09-10 (×15): 1 via ORAL
  Filled 2020-09-02 (×16): qty 1

## 2020-09-02 MED ORDER — HEPARIN SODIUM (PORCINE) 5000 UNIT/ML IJ SOLN
5000.0000 [IU] | Freq: Three times a day (TID) | INTRAMUSCULAR | Status: DC
  Administered 2020-09-02 – 2020-09-11 (×28): 5000 [IU] via SUBCUTANEOUS
  Filled 2020-09-02 (×28): qty 1

## 2020-09-02 NOTE — Progress Notes (Signed)
Subjective:  no acute events overnight. Son not present this AM. Only c/o is back pain.  No dysgeusia, able to eat dinner - but not robust po intake though this preceeded admission by months.   UOP 1.2L; no diuretics.   Likely to SNF soon.    Objective Vital signs in last 24 hours: Vitals:   09/01/20 2000 09/01/20 2345 09/02/20 0400 09/02/20 0753  BP: 115/72 120/76 118/75 107/81  Pulse: (!) 105 (!) 101 (!) 101 (!) 106  Resp: $Remo'18 18 18 15  'zZxOU$ Temp: 98.4 F (36.9 C) 98.2 F (36.8 C) 98.3 F (36.8 C) 97.8 F (36.6 C)  TempSrc: Oral Oral Oral Axillary  SpO2: 99% 98% 98% 99%  Weight:      Height:       Weight change:   Intake/Output Summary (Last 24 hours) at 09/02/2020 1046 Last data filed at 09/02/2020 0630 Gross per 24 hour  Intake 240 ml  Output 1250 ml  Net -1010 ml    Physical Exam: Gen: nad, awake and alert HEENT: anicteric sclera, MMM Chest: s1s2, rrr Resp: cta bl Abd: soft, nt/nd Msk; no edema Neuro: no tremors/asterixis   Labs: Basic Metabolic Panel: Recent Labs  Lab 08/31/20 0050 09/01/20 0139 09/02/20 0412  NA 133* 133* 132*  K 3.3* 3.8 3.7  CL 102 101 100  CO2 21* 21* 23  GLUCOSE 127* 126* 129*  BUN 58* 58* 56*  CREATININE 4.38* 4.39* 4.48*  CALCIUM 8.0* 8.1* 8.5*    Liver Function Tests: Recent Labs  Lab 08/31/20 0050 09/01/20 0139 09/02/20 0412  AST $Re'18 27 31  'FjD$ ALT 26 28 35  ALKPHOS 45 41 57  BILITOT 0.7 0.5 0.5  PROT 6.7 6.3* 6.9  ALBUMIN 2.2* 2.1* 2.3*    No results for input(s): LIPASE, AMYLASE in the last 168 hours. No results for input(s): AMMONIA in the last 168 hours.  CBC: Recent Labs  Lab 08/29/20 0149 08/30/20 0059 08/31/20 0050 09/01/20 0139 09/02/20 0816  WBC 3.8* 3.7* 3.9* 3.8* 5.3  NEUTROABS 2.5 2.3 2.3 2.0 3.0  HGB 7.8* 8.0* 8.1* 8.9* 8.5*  HCT 22.9* 24.1* 24.2* 26.7* 26.0*  MCV 92.7 93.4 94.5 94.0 94.5  PLT 64* 92* 109* 121* 174    Cardiac Enzymes: No results for input(s): CKTOTAL, CKMB, CKMBINDEX, TROPONINI  in the last 168 hours. CBG: Recent Labs  Lab 09/01/20 0737 09/01/20 1218 09/01/20 1710 09/01/20 2029 09/02/20 0756  GLUCAP 137* 130* 133* 162* 143*     Iron Studies:  No results for input(s): IRON, TIBC, TRANSFERRIN, FERRITIN in the last 72 hours.  Studies/Results: No results found. Medications: Infusions:  sodium chloride Stopped (08/22/20 1544)    Scheduled Medications:  calcium carbonate  1,000 mg of elemental calcium Per Tube BID WC   chlorhexidine  15 mL Mouth Rinse BID   Chlorhexidine Gluconate Cloth  6 each Topical Q0600   darbepoetin (ARANESP) injection - DIALYSIS  60 mcg Subcutaneous Q Mon-HD   feeding supplement  237 mL Oral TID BM   finasteride  5 mg Oral Daily   insulin aspart  0-5 Units Subcutaneous QHS   insulin aspart  0-9 Units Subcutaneous TID WC   Ipratropium-Albuterol  1 puff Inhalation TID   lidocaine  1 patch Transdermal Q24H   magic mouthwash  10 mL Oral TID   mouth rinse  15 mL Mouth Rinse q12n4p   mirtazapine  15 mg Oral QHS   multivitamin with minerals  1 tablet Oral Daily   senna-docusate  1  tablet Oral BID   sodium bicarbonate  1,300 mg Oral BID   tamsulosin  0.4 mg Oral QHS    have reviewed scheduled and prn medications.      Assessment/ Plan: Pt is a 75 y.o. yo male with multiple myeloma who was admitted on 08/11/2020 with COVID-  A on CRT-  crt 1.7 in Jan 2022-  now 7- non oliguric   1. A on CRF-  baseline creat 1.7 in Jan 2022, presented with Cr 7.3. Renal u/s neg for obstruction. UA unrevealing.  Suspect ATN from hypotension/ ACEi.  Myeloma kidney unlikely- per ONC w/u current myeloma testing is negative. Initially pt was refusing dialysis, but then changed his mind. Due to decline in responsiveness and persistent azotemia (creat in 7's), pt was started on CRRT 6/29. Pt became more alert and responsive w/ CRRT. CRRT dc'd on 7/02 when pt was transferred to [facility]    -Cr relatively stable today (likely in plateau phase).  No indication  for renal replacement therapy as of today, but would still maintain catheter. Waiting for kidney function improve further from here with some time provided we avoid further insults. The main risk for worsening renal function seems to be hypovolemia.   -I encouraged him and the son to help him eat and drink - if he cannot maintain oral intake at the expense of kidney function he may end up dialysis dependent which I don't think would provide him much quality of life or he would need to do comfort care.   -I think it's reasonable to discharge him when a SNF bed is available with close monitoring of labs and nephrology f/u. He is trying to discharge to a SNF 3.5hrs from here and I don't think it's feasible for him to travel to [facility] for follow up if that's were he ends up going.   Let me know if discharge is imminent. -cont to check daily labs, strict I/O 2. HTN/volume-  no vol excess on exam today. Bp acceptable 3. Anemia-  blood transfusion 6/23, Hb in 8s, ordered darbe 60 ug weekly IV started 7/4. Hgb stable 4. Hypocalcemia-    no sxms -   got bolus during hosp. Monitor albumin or ionized cal, replete prn 5. Hx multiple myeloma - no evidence here of active disease, see ONC notes 6. Pancytopenia - per Hem-Onc 7.  COVID 19 infection - sp course of remdesivir 8. Hyponatremia, mild-stable 9. Metabolic acidosis: nahco3 $RemoveBeforeDE'1300mg'HYGKBPsUjauoOTU$  bid   Jannifer Hick MD [facility] Pager [phone]

## 2020-09-02 NOTE — Progress Notes (Signed)
PROGRESS NOTE    Jeff Rodriguez  GYI:[id number] DOB: 1945-06-21 DOA: 08/11/2020 PCP: [facility]    Chief Complaint  Patient presents with   Cough    Brief Narrative:   75 y.o. male with medical history significant of multiple myeloma (on [name]), diabetes, hypertension, asthma, hyperlipidemia and osteoarthritis who was brought in by EMS secondary to productive cough, poor oral intake and not eating much.  Patient is currently undergoing chemo for his multiple myeloma. Tested positive for COVID 19, found to be hypokalemic and  in acute renal failure.  Nephrology team following.  in view of severe thrombocytopenia, oncology consulted and neupogen given. Bone scan 08/18/2020 negative for lytic lesions.  Due to poor prognosis, palliative care team consulted and patient decided for CRRT.  Per nephrology patient was transferred to [facility] for HD.   He has received 2 units of prbc transfusion so far.   His anemia, thrombocytopenia and renal function have all stabilized, his main issue was poor oral intake and not doing well at home, with addition of Remeron his oral intake has improved, medically now much improved, will require SNF.     Subjective:    He did have some nausea and vomiting earlier today, but this has resolved, he denies abdominal pain, fever or chills.     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   - He has a right IJ HD catheter , for now we will continue to keep the catheter per renal recommendation, need to be discontinued before discharge .  Multiple myeloma - Dr. Ennever/oncology on board.  Bone scan does not show any lytic lesions at this time.  Anemia of chronic disease - Transfuse to keep hemoglobin greater than 7, type screen and monitor done.  Counts much improved.  Lab Results  Component Value Date   WBC 5.3 09/02/2020   HGB 8.5 (L) 09/02/2020   HCT 26.0 (L) 09/02/2020   MCV 94.5 09/02/2020   PLT 174 09/02/2020    Underlying depression and oral intake being poor for several weeks according to the son.  Not suicidal homicidal.  Placed on Remeron and monitor.    Severe thrombocytopenia - due to multiple myeloma counts much improved.  Acute metabolic encephalopathy - Improving , patient is alert answering questions appropriately. Initial CT of the head is negative for acute intracranial abnormalities.TSH, vitamin B12 normal, ammonia level within normal limits, this problem is almost completely resolved.  COVID 19 virus infection. Completed the course of remdesivir, continue with flutter valve and incentive spirometer and inhalers as needed. Therapy evaluations recommending SNF. He is off isolation.  Hypokalemia and hypomagnesemia.  Both replaced.  Hypertension: Well controlled.   Hyperlipidemia - resume home dose statin once acute issues better.  Severe PCM.  He had NG tube with tube feeds, NG accidentally pulled out on 08/26/2020, monitor on oral protein supplements and oral diet.  Right shoulder blade muscle cramps.  Tramadol and 1 dose of baclofen.   Dysuria 08/31/20 - UA looks dirty, get UC, 1 dose Fosfomycin and monitor.  Type 2 diabetes mellitus -labile sugars due to labile oral  intake, monitor on low-dose sliding scale  CBG (last 3)  Recent Labs    09/02/20 0756 09/02/20 1209 09/02/20 1610  GLUCAP 143* 123* 193*      Pressure injury on the back.  Pressure Injury 08/21/20 Coccyx Right;Left;Mid Stage 2 -  Partial thickness loss of dermis presenting as a shallow open injury with a red, pink wound bed without slough. (Active)  08/21/20 2000  Location:  Coccyx  Location Orientation: Right;Left;Mid  Staging: Stage 2 -  Partial thickness loss of dermis presenting as a shallow open injury with a red, pink wound bed without slough.  Wound Description (Comments):   Present on Admission:      Pressure Injury 08/24/20 Coccyx Medial Stage 2 -  Partial thickness loss of dermis presenting as a shallow open injury with a red, pink wound bed without slough. pink open (Active)  08/24/20 0910  Location: Coccyx  Location Orientation: Medial  Staging: Stage 2 -  Partial thickness loss of dermis presenting as a shallow open injury with a red, pink wound bed without slough.  Wound Description (Comments): pink open  Present on Admission: No   Wound care  will be consulted.    Failure to thrive - Dietary will be consulted, PT -OT   In view of multiple comorbidities, clinical deterioration, hemodialysis dependent, AKI, poor progression palliative care consulted for goals of care discussion.    DVT prophylaxis: SCDs Code Status: DNR - discussed with patient in detail on 08/28/2020 wants to be DNR, does not want a feeding tube Family Communication:  None at bedside Disposition:   Status is: Inpatient  Remains inpatient appropriate because:Ongoing diagnostic testing needed not appropriate for outpatient work up and Unsafe d/c plan  Dispo: The patient is from: Home              Anticipated d/c is to: SNF              Patient currently is not medically stable to d/c.   Difficult to place patient No   Consultants:  Renal, Oncology, Pall Care  Procedures: none.   Antimicrobials:  Antibiotics Given (last 72 hours)     Date/Time Action Medication Dose   09/01/20 1333 Given   fosfomycin (MONUROL) packet 3 g 3 g         Objective: Vitals:   09/02/20 0400 09/02/20 0753 09/02/20 1205 09/02/20 1607  BP: 118/75 107/81 116/77 100/62  Pulse: (!) 101 (!) 106 100 96  Resp: $Remo'18 15 18 16  'Sgvta$ Temp: 98.3 F (36.8 C) 97.8 F (36.6 C) 97.6 F (36.4 C)  97.8 F (36.6 C)  TempSrc: Oral Axillary Oral Oral  SpO2: 98% 99% 100% 100%  Weight:      Height:        Intake/Output Summary (Last 24 hours) at 09/02/2020 1651 Last data filed at 09/02/2020 0630 Gross per 24 hour  Intake 240 ml  Output 1250 ml  Net -1010 ml   Filed Weights   08/27/20 0500 08/28/20 0423 09/01/20 0500  Weight: 67.5 kg 65.6 kg 62.3 kg    Examination:  Awake Alert, Oriented X 3, No new F.N deficits, frail and deconditioned, right IJ HD catheter Symmetrical Chest wall movement, Good air movement bilaterally, CTAB RRR,No Gallops,Rubs or new Murmurs, No Parasternal Heave +ve B.Sounds, Abd Soft, No tenderness, No rebound - guarding or rigidity. No Cyanosis, Clubbing or edema, No new Rash or bruise         Data Reviewed: I have personally reviewed following  labs and imaging studies  CBC: Recent Labs  Lab 08/29/20 0149 08/30/20 0059 08/31/20 0050 09/01/20 0139 09/02/20 0816  WBC 3.8* 3.7* 3.9* 3.8* 5.3  NEUTROABS 2.5 2.3 2.3 2.0 3.0  HGB 7.8* 8.0* 8.1* 8.9* 8.5*  HCT 22.9* 24.1* 24.2* 26.7* 26.0*  MCV 92.7 93.4 94.5 94.0 94.5  PLT 64* 92* 109* 121* 641    Basic Metabolic Panel: Recent Labs  Lab 08/29/20 0149 08/30/20 0059 08/31/20 0050 09/01/20 0139 09/02/20 0412  NA 132* 134* 133* 133* 132*  K 3.3* 3.4* 3.3* 3.8 3.7  CL 102 101 102 101 100  CO2 21* 21* 21* 21* 23  GLUCOSE 187* 130* 127* 126* 129*  BUN 63* 59* 58* 58* 56*  CREATININE 4.31* 4.26* 4.38* 4.39* 4.48*  CALCIUM 7.3* 7.5* 8.0* 8.1* 8.5*  MG 2.0 1.8 1.7 1.8 1.7    GFR: Estimated Creatinine Clearance: 12.7 mL/min (A) (by C-G formula based on SCr of 4.48 mg/dL (H)).  Liver Function Tests: Recent Labs  Lab 08/29/20 0149 08/30/20 0059 08/31/20 0050 09/01/20 0139 09/02/20 0412  AST $Re'22 24 18 27 31  'ATz$ ALT $R'26 30 26 28 'BM$ 35  ALKPHOS 45 45 45 41 57  BILITOT 0.6 0.6 0.7 0.5 0.5  PROT 6.3* 6.5 6.7 6.3* 6.9  ALBUMIN 2.1* 2.2* 2.2* 2.1* 2.3*    CBG: Recent Labs  Lab  09/01/20 1710 09/01/20 2029 09/02/20 0756 09/02/20 1209 09/02/20 1610  GLUCAP 133* 162* 143* 123* 193*     Recent Results (from the past 240 hour(s))  SARS CORONAVIRUS 2 (TAT 6-24 HRS) Nasopharyngeal Nasopharyngeal Swab     Status: Abnormal   Collection Time: 08/24/20  5:16 PM   Specimen: Nasopharyngeal Swab  Result Value Ref Range Status   SARS Coronavirus 2 POSITIVE (A) NEGATIVE Final    Comment: (NOTE) SARS-CoV-2 target nucleic acids are DETECTED.  The SARS-CoV-2 RNA is generally detectable in upper and lower respiratory specimens during the acute phase of infection. Positive results are indicative of the presence of SARS-CoV-2 RNA. Clinical correlation with patient history and other diagnostic information is  necessary to determine patient infection status. Positive results do not rule out bacterial infection or co-infection with other viruses.  The expected result is Negative.  Fact Sheet for Patients: SugarRoll.be  Fact Sheet for Healthcare Providers: https://www.woods-mathews.com/  This test is not yet approved or cleared by the [place] FDA and  has been authorized for detection and/or diagnosis of SARS-CoV-2 by FDA under an Emergency Use Authorization (EUA). This EUA will remain  in effect (meaning this test can be used) for the duration of the COVID-19 declaration under Section 564(b)(1) of the Act, 21 U. S.C. section 360bbb-3(b)(1), unless the authorization is terminated or revoked sooner.   Performed at [facility], [place] [address], [place], [place] [address]   Culture, Urine     Status: Abnormal (Preliminary result)   Collection Time: 09/01/20 10:55 AM   Specimen: Urine, Clean Catch  Result Value Ref Range Status   Specimen Description URINE, CLEAN CATCH  Final   Special Requests   Final    NONE Performed at [facility], [place] [address], [place], [place] [address]    Culture >=100,000  COLONIES/mL ENTEROBACTER AEROGENES (A)  Final   Report Status PENDING  Incomplete      Radiology Studies: DG Abd Portable 1V  Result Date: 09/02/2020 CLINICAL DATA:  Vomiting EXAM: PORTABLE ABDOMEN - 1 VIEW COMPARISON:  08/27/2020 FINDINGS: Nonobstructive bowel gas pattern. Moderate volume  of stool within the colon. No gross free intraperitoneal air on supine imaging. Cholecystectomy clips and right hip arthroplasty. Multilevel thoracolumbar compression fractures status post cement augmentation. IMPRESSION: Nonobstructive bowel gas pattern. Electronically Signed   By: Davina Poke D.O.   On: 09/02/2020 13:04     Scheduled Meds:  calcium carbonate  1,000 mg of elemental calcium Per Tube BID WC   chlorhexidine  15 mL Mouth Rinse BID   Chlorhexidine Gluconate Cloth  6 each Topical Q0600   darbepoetin (ARANESP) injection - DIALYSIS  60 mcg Subcutaneous Q Mon-HD   feeding supplement  237 mL Oral TID BM   finasteride  5 mg Oral Daily   insulin aspart  0-5 Units Subcutaneous QHS   insulin aspart  0-9 Units Subcutaneous TID WC   Ipratropium-Albuterol  1 puff Inhalation TID   lidocaine  1 patch Transdermal Q24H   magic mouthwash  10 mL Oral TID   mouth rinse  15 mL Mouth Rinse q12n4p   mirtazapine  15 mg Oral QHS   multivitamin with minerals  1 tablet Oral Daily   senna-docusate  1 tablet Oral BID   sodium bicarbonate  1,300 mg Oral BID   tamsulosin  0.4 mg Oral QHS   Continuous Infusions:  sodium chloride Stopped (08/22/20 1544)     LOS: 22 days   Signature  Phillips Climes M.D on 09/02/2020 at 4:51 PM   -  To page go to [email or web address]

## 2020-09-02 NOTE — TOC Progression Note (Addendum)
Transition of Care [facility]) - Progression Note    Patient Details  Name: THEOPHILE HARVIE MRN: [id number] Date of Birth: 1945/05/24  Transition of Care [facility]) CM/SW [place], LCSW Phone Number: 09/02/2020, 8:42 AM  Clinical Narrative:    8:42am-CSW left voicemail for Tiana at [facility] [phone]) regarding if they can accept patient.   245pm-CSW met with patient's son and provided an update. He is still hopeful for one of the facilities close to him. CSW requested [place] update CSW if anything becomes available.    Expected Discharge Plan: Skilled Nursing Facility Barriers to Discharge: SNF Pending bed offer  Expected Discharge Plan and Services Expected Discharge Plan: [place]   Discharge Planning Services: CM Consult Post Acute Care Choice: [place] Living arrangements for the past 2 months: Single Family Home                                       Social Determinants of Health (SDOH) Interventions    Readmission Risk Interventions No flowsheet data found.

## 2020-09-02 NOTE — Progress Notes (Signed)
Nutrition Follow-up  DOCUMENTATION CODES:  Non-severe (moderate) malnutrition in context of acute illness/injury  INTERVENTION:  Discontinue Magic Cup TID.  Add Vital Cuisine Shake BID, each supplement provides 520 kcal and 22 grams of protein.  Continue Ensure Enlive TID.  Continue MVI with minerals daily.  NUTRITION DIAGNOSIS:  Moderate Malnutrition related to acute illness (COVID-19) as evidenced by mild fat depletion, mild muscle depletion, moderate muscle depletion. - ongoing  GOAL:  Patient will meet greater than or equal to 90% of their needs - progressing  MONITOR:  PO intake, Supplement acceptance, Labs, Weight trends, Skin, I & O's  REASON FOR ASSESSMENT:  Consult Enteral/tube feeding initiation and management  ASSESSMENT:  Pt with a PMH significant of multiple myeloma (currently undergoing chemo), DM, HTN, HLD, asthma, and OA admitted with hypokalemia, AKI as well as COVID-19 infection 6/29 - NGT placed (proximal duodenum) 7/2 - transferred to [facility], CRRT d/c 7/6 - cortrak tube dislodged and removed 7/7 - advanced to soft diet   Pt has medically improved remarkably over the past few days with addition of Remeron his oral intake has improved, medically now much improved, will require SNF.  Per Epic, pt's intake has improved over the past few days - 10-75%, more 50-75%. Documentation is however limited. Spoke with pt and son at bedside. Pt reports not liking the Magic Cups, but is interested in trying the chocolate Hormel shake.  Admit wt: 63.1 kg Current wt: 62.3 kg  Medications: reviewed; Os-Cal BID, Aransep on Mondays, EE TID, SSI, bedtime Novolog, mealtime Novolog, Remeron, MVI with minerals, Senokot BID, Sodium Bicarb BID, Zofran PRN (given once this morning)  Labs: reviewed; Na 132 (L), CBG 123-162 (H)  Diet Order:   Diet Order             Diet regular Room service appropriate? Yes; Fluid consistency: Thin  Diet effective now                   EDUCATION NEEDS:  Education needs have been addressed  Skin:  Skin Assessment: Skin Integrity Issues: Skin Integrity Issues:: Stage II Stage I: - Stage II: coccyx  Last BM:  08/25/20 - OBR in place  Height:  Ht Readings from Last 1 Encounters:  08/11/20 $RemoveB'5\' 8"'kXbqyAom$  (1.727 m)   Weight:  Wt Readings from Last 1 Encounters:  09/01/20 62.3 kg   Ideal Body Weight:  70 kg  BMI:  Body mass index is 20.88 kg/m.  Estimated Nutritional Needs:  Kcal:  2000-2200 Protein:  120-135 grams Fluid:  > 2 L  Derrel Nip, RD, LDN (she/her/hers) Registered Dietitian I After-Hours/Weekend Pager # in [place]

## 2020-09-03 DIAGNOSIS — C9000 Multiple myeloma not having achieved remission: Secondary | ICD-10-CM | POA: Diagnosis not present

## 2020-09-03 DIAGNOSIS — R627 Adult failure to thrive: Secondary | ICD-10-CM | POA: Diagnosis not present

## 2020-09-03 DIAGNOSIS — N179 Acute kidney failure, unspecified: Secondary | ICD-10-CM | POA: Diagnosis not present

## 2020-09-03 LAB — URINALYSIS, ROUTINE W REFLEX MICROSCOPIC
Bilirubin Urine: NEGATIVE
Glucose, UA: >=500 mg/dL — AB
Ketones, ur: 5 mg/dL — AB
Nitrite: NEGATIVE
Protein, ur: 100 mg/dL — AB
RBC / HPF: >50 RBC/hpf — ABNORMAL HIGH (ref 0–5)
Specific Gravity, Urine: 1.01 (ref 1.005–1.030)
WBC, UA: >50 WBC/hpf — ABNORMAL HIGH (ref 0–5)
pH: 6 (ref 5.0–8.0)

## 2020-09-03 LAB — COMPREHENSIVE METABOLIC PANEL
ALT: 34 U/L (ref 0–44)
AST: 23 U/L (ref 15–41)
Albumin: 2.3 g/dL — ABNORMAL LOW (ref 3.5–5.0)
Alkaline Phosphatase: 62 U/L (ref 38–126)
Anion gap: 9 (ref 5–15)
BUN: 60 mg/dL — ABNORMAL HIGH (ref 8–23)
CO2: 25 mmol/L (ref 22–32)
Calcium: 8.6 mg/dL — ABNORMAL LOW (ref 8.9–10.3)
Chloride: 99 mmol/L (ref 98–111)
Creatinine, Ser: 4.67 mg/dL — ABNORMAL HIGH (ref 0.61–1.24)
GFR, Estimated: 12 mL/min — ABNORMAL LOW (ref >60)
Glucose, Bld: 152 mg/dL — ABNORMAL HIGH (ref 70–99)
Potassium: 3.8 mmol/L (ref 3.5–5.1)
Sodium: 133 mmol/L — ABNORMAL LOW (ref 135–145)
Total Bilirubin: 0.3 mg/dL (ref 0.3–1.2)
Total Protein: 7.3 g/dL (ref 6.5–8.1)

## 2020-09-03 LAB — CBC WITH DIFFERENTIAL/PLATELET
Abs Immature Granulocytes: 0.02 10*3/uL (ref 0.00–0.07)
Basophils Absolute: 0.1 10*3/uL (ref 0.0–0.1)
Basophils Relative: 2 %
Eosinophils Absolute: 0.1 10*3/uL (ref 0.0–0.5)
Eosinophils Relative: 3 %
HCT: 25.9 % — ABNORMAL LOW (ref 39.0–52.0)
Hemoglobin: 8.5 g/dL — ABNORMAL LOW (ref 13.0–17.0)
Immature Granulocytes: 0 %
Lymphocytes Relative: 23 %
Lymphs Abs: 1.1 10*3/uL (ref 0.7–4.0)
MCH: 30.7 pg (ref 26.0–34.0)
MCHC: 32.8 g/dL (ref 30.0–36.0)
MCV: 93.5 fL (ref 80.0–100.0)
Monocytes Absolute: 0.7 10*3/uL (ref 0.1–1.0)
Monocytes Relative: 16 %
Neutro Abs: 2.7 10*3/uL (ref 1.7–7.7)
Neutrophils Relative %: 56 %
Platelets: 189 10*3/uL (ref 150–400)
RBC: 2.77 MIL/uL — ABNORMAL LOW (ref 4.22–5.81)
RDW: 15.8 % — ABNORMAL HIGH (ref 11.5–15.5)
WBC: 4.7 10*3/uL (ref 4.0–10.5)
nRBC: 0 % (ref 0.0–0.2)

## 2020-09-03 LAB — MAGNESIUM: Magnesium: 1.9 mg/dL (ref 1.7–2.4)

## 2020-09-03 LAB — URINE CULTURE: Culture: >=100000 — AB

## 2020-09-03 LAB — GLUCOSE, CAPILLARY
Glucose-Capillary: 141 mg/dL — ABNORMAL HIGH (ref 70–99)
Glucose-Capillary: 196 mg/dL — ABNORMAL HIGH (ref 70–99)
Glucose-Capillary: 230 mg/dL — ABNORMAL HIGH (ref 70–99)
Glucose-Capillary: 135 mg/dL — ABNORMAL HIGH (ref 70–99)

## 2020-09-03 NOTE — Progress Notes (Signed)
Pt's urine noted to be milky white-yellow. Sent UA. Bladder scan revealed >77ml. Per Dr. Waldron Labs place foley. Urine return noted upon placement, cloudy yellow. Amount drained around 1070ml. At first was more yellow but the more that drained started turning milky white-yellow.

## 2020-09-03 NOTE — Progress Notes (Signed)
Occupational Therapy Treatment Patient Details Name: Jeff Rodriguez MRN: [id number] DOB: 1945-05-28 Today's Date: 09/03/2020    History of present illness patient is a 75 year old male who was admitted to the hospital on 6/22 with COVID 19 and AKI; at [facility], had initiated Renal Replacement Therapy, transferred to [facility];  past medical history significant for multiple myeloma, diabetes mellitus, hypertension, asthma, hyperlipidemia, osteoarthritis.   OT comments  Pt received in recliner, agreeable to OT session. He required modA to powerup into standing from recliner and BSC. He required minA for stand-pivot to [facility]. Pt reports of urgency for BM but was unsuccessful. Pt required minA at 4WW level to ambulate to the sink and complete grooming ADL while seated at sink level. Continued to educate pt on energy conservation strategies and pacing for improved independence and safety with ADL completion. Pt will continue to benefit from skilled OT services to maximize safety and independence with ADL/IADL and functional mobility. Will continue to follow acutely and progress as tolerated.    Follow Up Recommendations  SNF    Equipment Recommendations  Tub/shower seat;3 in 1 bedside commode    Recommendations for Other Services      Precautions / Restrictions Precautions Precautions: Fall Restrictions Weight Bearing Restrictions: No       Mobility Bed Mobility Overal bed mobility: Needs Assistance Bed Mobility: Supine to Sit     Supine to sit: Mod assist;HOB elevated     General bed mobility comments: pt sitting in recliner upon arrival    Transfers Overall transfer level: Needs assistance Equipment used: 4-wheeled walker Transfers: Sit to/from [facility] Sit to Stand: Mod assist Stand pivot transfers: Min assist       General transfer comment: modA to powerup into standing, pt with good initiation of safe hand placement    Balance Overall balance assessment:  Needs assistance Sitting-balance support: No upper extremity supported Sitting balance-Leahy Scale: Fair Sitting balance - Comments: close min guard for safety and tending to hold onto supportive structures for balance even in sitting   Standing balance support: Bilateral upper extremity supported Standing balance-Leahy Scale: Poor Standing balance comment: heavy reliance on BUE support on rollator                           ADL either performed or assessed with clinical judgement   ADL Overall ADL's : Needs assistance/impaired Eating/Feeding: Set up;Sitting   Grooming: Set up;Sitting;Wash/dry face;Oral care Grooming Details (indicate cue type and reason): at sink level;             Lower Body Dressing: Moderate assistance;Sit to/from stand Lower Body Dressing Details (indicate cue type and reason): modA to powerup into standing Toilet Transfer: Moderate assistance;Stand-pivot Toilet Transfer Details (indicate cue type and reason): modA to powerup into standing, minA for stand-[place] to [facility] Toileting- Clothing Manipulation and Hygiene: Moderate assistance;Sit to/from stand Toileting - Clothing Manipulation Details (indicate cue type and reason): external male catheter, pt on BSC attempting to void     Functional mobility during ADLs: Moderate assistance;+2 for physical assistance;+2 for safety/equipment General ADL Comments: Fatigues easily. Instructed in pacing and interspersing rest breaks.     Vision   Vision Assessment?: No apparent visual deficits   Perception     Praxis      Cognition Arousal/Alertness: Awake/alert Behavior During Therapy: WFL for tasks assessed/performed Overall Cognitive Status: Impaired/Different from baseline Area of Impairment: Memory;Safety/judgement;Attention;Following commands;Awareness;Problem solving  Current Attention Level: Sustained Memory: Decreased short-term memory Following Commands: Follows  one step commands with increased time;Follows multi-step commands inconsistently Safety/Judgement: Decreased awareness of safety;Decreased awareness of deficits Awareness: Emergent Problem Solving: Slow processing;Decreased initiation;Difficulty sequencing;Requires verbal cues General Comments: pt appears to have improvement with cognition, pt initiating locking his breaks and following one step commands with increased time but doing so consistently        Exercises     Shoulder Instructions       General Comments SpO2 >95% RA with exertion    Pertinent Vitals/ Pain       Pain Assessment: 0-10 Pain Score: 5  Faces Pain Scale: Hurts a little bit Pain Location: back Pain Descriptors / Indicators: Discomfort;Sore;Grimacing Pain Intervention(s): Monitored during session;Limited activity within patient's tolerance  Home Living                                          Prior Functioning/Environment              Frequency  Min 2X/week        Progress Toward Goals  OT Goals(current goals can now be found in the care plan section)  Progress towards OT goals: Progressing toward goals  Acute Rehab OT Goals Patient Stated Goal: Go home OT Goal Formulation: With patient/family Time For Goal Achievement: 09/11/20 Potential to Achieve Goals: Fair ADL Goals Pt Will Perform Lower Body Dressing: sit to/from stand;with min assist Pt Will Transfer to Toilet: with min guard assist;ambulating;bedside commode Pt Will Perform Toileting - Clothing Manipulation and hygiene: with min guard assist;sit to/from stand Additional ADL Goal #1: Pt will initiate rest breaks appropriately during ADL and mobility.  Plan Discharge plan remains appropriate    Co-evaluation                 AM-PAC OT "6 Clicks" Daily Activity     Outcome Measure   Help from another person eating meals?: None Help from another person taking care of personal grooming?: A Little Help from  another person toileting, which includes using toliet, bedpan, or urinal?: A Lot Help from another person bathing (including washing, rinsing, drying)?: A Lot Help from another person to put on and taking off regular upper body clothing?: A Lot Help from another person to put on and taking off regular lower body clothing?: A Lot 6 Click Score: 15    End of Session Equipment Utilized During Treatment: Gait belt;Rolling walker  OT Visit Diagnosis: Muscle weakness (generalized) (M62.81);Pain Pain - part of body:  (back)   Activity Tolerance Patient tolerated treatment well   Patient Left in chair;with call bell/phone within reach;with chair alarm set   Nurse Communication          Time: 1941-7408 OT Time Calculation (min): 33 min  Charges: OT General Charges $OT Visit: 1 Visit OT Treatments $Self Care/Home Management : 23-37 mins  Helene Kelp OTR/L Acute Rehabilitation Services Office: [place] 09/03/2020, 3:43 PM

## 2020-09-03 NOTE — Progress Notes (Signed)
Physical Therapy Treatment Patient Details Name: Jeff Rodriguez MRN: [id number] DOB: [birth year] Today's Date: 09/03/2020    History of Present Illness patient is a 75 year old male who was admitted to the hospital on 6/22 with COVID 19 and AKI; at [facility], had initiated Renal Replacement Therapy, transferred to [facility];  past medical history significant for multiple myeloma, diabetes mellitus, hypertension, asthma, hyperlipidemia, osteoarthritis.    PT Comments    Patient received in bed, more alert and awake today. Still needs heavy physical assist for bed mobility and some transfers today, otherwise able to progress gait distance and overall activity with good tolerance! Does still need help maintaining appropriate distance from 4WW especially when fatigued as well as heavy cues to lock brakes. Tried to have BM but he was unable today. Left up in recliner with all needs met, chair alarm active and family present. Continues to be very easily fatigued and deconditioned, would still benefit from SNF.    Follow Up Recommendations  SNF     Equipment Recommendations  Rolling walker with 5" wheels;3in1 (PT)    Recommendations for Other Services       Precautions / Restrictions Precautions Precautions: Fall Restrictions Weight Bearing Restrictions: No    Mobility  Bed Mobility Overal bed mobility: Needs Assistance Bed Mobility: Supine to Sit     Supine to sit: Mod assist;HOB elevated     General bed mobility comments: ModA to bring trunk up and swing hips around to EOB    Transfers Overall transfer level: Needs assistance Equipment used: 4-wheeled walker Transfers: Sit to/from Stand Sit to Stand: Mod assist Stand pivot transfers: Min assist       General transfer comment: ModA to power up into standing, cues for hand placement and locking brakes, increased time and effort  Ambulation/Gait Ambulation/Gait assistance: Min assist;+2 safety/equipment Gait Distance (Feet): 25  Feet (55ft, 55ft) Assistive device: 4-wheeled walker Gait Pattern/deviations: Trunk flexed;Drifts right/left;Step-through pattern;Decreased step length - right;Decreased step length - left;Narrow base of support Gait velocity: decreased   General Gait Details: continues to require MinA for balance and to keep 4WW at appropriate distance- but tolerated progression of gait well today with chair follow   Stairs             Wheelchair Mobility    Modified Rankin (Stroke Patients Only)       Balance Overall balance assessment: Needs assistance Sitting-balance support: No upper extremity supported Sitting balance-Leahy Scale: Fair Sitting balance - Comments: close min guard for safety and tending to hold onto supportive structures for balance even in sitting   Standing balance support: Bilateral upper extremity supported Standing balance-Leahy Scale: Poor Standing balance comment: heavy reliance on BUE support on rollator                            Cognition Arousal/Alertness: Awake/alert Behavior During Therapy: WFL for tasks assessed/performed Overall Cognitive Status: Impaired/Different from baseline Area of Impairment: Memory;Safety/judgement;Attention;Following commands;Awareness;Problem solving                   Current Attention Level: Sustained Memory: Decreased short-term memory Following Commands: Follows one step commands with increased time;Follows multi-step commands inconsistently Safety/Judgement: Decreased awareness of safety;Decreased awareness of deficits Awareness: Emergent Problem Solving: Slow processing;Decreased initiation;Difficulty sequencing;Requires verbal cues General Comments: pt appears to have improvement with cognition as compared to last session, followed commands with increased time however does still need reminders about locking brakes on rollator  Exercises      General Comments General comments (skin integrity,  edema, etc.): (P) SpO2 >95% RA with exertion      Pertinent Vitals/Pain Pain Assessment: Faces Pain Score: 5  Faces Pain Scale: Hurts a little bit Pain Location: back Pain Descriptors / Indicators: Discomfort;Sore;Grimacing Pain Intervention(s): Limited activity within patient's tolerance;Monitored during session    Home Living                      Prior Function            PT Goals (current goals can now be found in the care plan section) Acute Rehab PT Goals Patient Stated Goal: Go home PT Goal Formulation: With patient Time For Goal Achievement: 09/14/20 Potential to Achieve Goals: Good Progress towards PT goals: Progressing toward goals    Frequency    Min 2X/week      PT Plan Current plan remains appropriate    Co-evaluation              AM-PAC PT "6 Clicks" Mobility   Outcome Measure  Help needed turning from your back to your side while in a flat bed without using bedrails?: A Little Help needed moving from lying on your back to sitting on the side of a flat bed without using bedrails?: A Lot Help needed moving to and from a bed to a chair (including a wheelchair)?: A Little Help needed standing up from a chair using your arms (e.g., wheelchair or bedside chair)?: A Lot Help needed to walk in hospital room?: A Little Help needed climbing 3-5 steps with a railing? : Total 6 Click Score: 14    End of Session Equipment Utilized During Treatment: Gait belt Activity Tolerance: Patient limited by fatigue Patient left: in chair;with call bell/phone within reach;with chair alarm set Nurse Communication: Mobility status PT Visit Diagnosis: Other abnormalities of gait and mobility (R26.89);Muscle weakness (generalized) (M62.81)     Time: 6962-9528 PT Time Calculation (min) (ACUTE ONLY): 23 min  Charges:  $Gait Training: 8-22 mins $Therapeutic Activity: 8-22 mins                    Windell Norfolk, DPT, PN1   Supplemental Physical  Therapist [place]    Pager [phone] Acute Rehab Office [phone]

## 2020-09-03 NOTE — Progress Notes (Signed)
   09/03/20 0826  Assess: MEWS Score  Temp 98.4 F (36.9 C)  BP (!) 88/45  Pulse Rate (!) 109  Resp 16  SpO2 100 %  Assess: MEWS Score  MEWS Temp 0  MEWS Systolic 1  MEWS Pulse 1  MEWS RR 0  MEWS LOC 0  MEWS Score 2  MEWS Score Color Yellow  Assess: if the MEWS score is Yellow or Red  Were vital signs taken at a resting state? Yes  Focused Assessment No change from prior assessment  Does the patient meet 2 or more of the SIRS criteria? No  Early Detection of Sepsis Score *See Row Information* Low  MEWS guidelines implemented *See Row Information* Yes  Treat  MEWS Interventions Escalated (See documentation below)  Take Vital Signs  Increase Vital Sign Frequency  Yellow: Q 2hr X 2 then Q 4hr X 2, if remains yellow, continue Q 4hrs  Escalate  MEWS: Escalate Yellow: discuss with charge nurse/RN and consider discussing with provider and RRT  Notify: Charge Nurse/RN  Name of Charge Nurse/RN Notified Erin RN  Date Charge Nurse/RN Notified 09/03/20  Time Charge Nurse/RN Notified 0830  Document  Patient Outcome Stabilized after interventions  Progress note created (see row info) Yes  Assess: SIRS CRITERIA  SIRS Temperature  0  SIRS Pulse 1  SIRS Respirations  0  SIRS WBC 0  SIRS Score Sum  1

## 2020-09-03 NOTE — Progress Notes (Signed)
PROGRESS NOTE    Jeff Rodriguez  PFY:[id number] DOB: [birth year] DOA: 08/11/2020 PCP: [facility]    Chief Complaint  Patient presents with   Cough    Brief Narrative:   75 y.o. male with medical history significant of multiple myeloma (on [name]), diabetes, hypertension, asthma, hyperlipidemia and osteoarthritis who was brought in by EMS secondary to productive cough, poor oral intake and not eating much.  Patient is currently undergoing chemo for his multiple myeloma. Tested positive for COVID 19, found to be hypokalemic and  in acute renal failure.  Nephrology team following.  in view of severe thrombocytopenia, oncology consulted and neupogen given. Bone scan 08/18/2020 negative for lytic lesions.  Due to poor prognosis, palliative care team consulted and patient decided for CRRT.  Per nephrology patient was transferred to [facility] for HD.   He has received 2 units of prbc transfusion so far.   His anemia, thrombocytopenia and renal function have all stabilized, his main issue was poor oral intake and not doing well at home, with addition of Remeron his oral intake has improved, medically now much improved, will require SNF.     Subjective:    He denies any further nausea or vomiting yesterday, but overall remains appetite remains poor, he does reports lower back pain as well.     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   - He has a right IJ HD catheter , for now we will continue to keep the catheter per renal recommendation, need to be discontinued before discharge . -Unclear if patient will need  long-term hemodialysis or not, this is assessed daily by renal depends on his urine output and renal function.  Multiple myeloma - Dr. Ennever/oncology on board. Bone scan does not show any lytic lesions at this time.  Anemia of chronic disease - Transfuse to keep hemoglobin greater than 7, type screen and monitor done.  Counts much improved.  Lab Results  Component Value Date   WBC 4.7 09/03/2020   HGB 8.5 (L) 09/03/2020   HCT 25.9 (L) 09/03/2020   MCV 93.5 09/03/2020   PLT 189 09/03/2020    Underlying depression and oral intake being poor for several weeks according to the son.  Not suicidal homicidal.  Placed on Remeron and monitor.    Severe thrombocytopenia - due to multiple myeloma counts much improved.  Acute metabolic encephalopathy  -All mentation has improved, patient is answering questions appropriately ,. Initial CT of the head is negative for acute intracranial abnormalities.TSH, vitamin B12 normal, ammonia level within normal limits, this problem is almost completely resolved.  COVID 19 virus infection. Completed the course of remdesivir, continue with flutter valve and incentive spirometer and inhalers as needed. Therapy evaluations recommending SNF. He is off isolation.  Hypokalemia and hypomagnesemia.  Both replaced.  Hypertension: Well controlled.   Hyperlipidemia - resume home dose statin once acute issues better.  Severe PCM.  He had NG tube with tube feeds, NG accidentally pulled out on 08/26/2020, monitor on oral protein supplements and oral diet.  Right shoulder blade muscle cramps.  Tramadol and 1 dose of  baclofen.   Dysuria 08/31/20 - UA looks dirty, get UC, 1 dose Fosfomycin and monitor.  Type 2 diabetes mellitus -labile sugars due to labile oral intake, monitor on low-dose sliding scale  CBG (last 3)  Recent Labs    09/02/20 2004 09/03/20 0825 09/03/20 1203  GLUCAP 238* 141* 196*      Pressure injury on the back.  Pressure Injury 08/21/20  Coccyx Right;Left;Mid Stage 2 -  Partial thickness loss of dermis presenting as a shallow open injury with a red, pink wound bed without slough. (Active)  08/21/20 2000  Location: Coccyx  Location Orientation: Right;Left;Mid  Staging: Stage 2 -  Partial thickness loss of dermis presenting as a shallow open injury with a red, pink wound bed without slough.  Wound Description (Comments):   Present on Admission:      Pressure Injury 08/24/20 Coccyx Medial Stage 2 -  Partial thickness loss of dermis presenting as a shallow open injury with a red, pink wound bed without slough. pink open (Active)  08/24/20 0910  Location: Coccyx  Location Orientation: Medial  Staging: Stage 2 -  Partial thickness loss of dermis presenting as a shallow open injury with a red, pink wound bed without slough.  Wound Description (Comments): pink open  Present on Admission: No   Wound care  will be consulted.    Failure to thrive - Dietary will be consulted, PT -OT   In view of multiple comorbidities, clinical deterioration, hemodialysis dependent, AKI, poor progression palliative care consulted for goals of care discussion.    DVT prophylaxis: [place] heparin Code Status: DNR - discussed with patient in detail on 08/28/2020 wants to be DNR, does not want a feeding tube Family Communication:  None at bedside Disposition:   Status is: Inpatient  Remains inpatient appropriate because:Ongoing diagnostic testing needed not appropriate for outpatient work up and Unsafe d/c plan  Dispo: The patient is from: Home              Anticipated d/c is to: SNF              Patient currently is not medically stable to d/c.   Difficult to place patient No   Consultants:  Renal, Oncology, Pall Care  Procedures: none.   Antimicrobials:  Antibiotics Given (last 72 hours)     Date/Time Action Medication Dose   09/01/20 1333 Given   fosfomycin (MONUROL) packet 3 g 3 g         Objective: Vitals:   09/03/20 0405  09/03/20 0826 09/03/20 1002 09/03/20 1201  BP: 106/70 (!) 88/45 106/73 101/68  Pulse: (!) 102 (!) 109 (!) 105 (!) 103  Resp: _0 Temp: 98.4 F (36.9 C) 98.4 F (36.9 C) 98.6 F (37 C) 98.1 F (36.7 C)  TempSrc: Oral Oral Oral Oral  SpO2: 100% 100% 100% 100%  Weight:      Height:        Intake/Output Summary (Last 24 hours) at 09/03/2020 1521 Last data filed at 09/03/2020 0500 Gross per 24 hour  Intake 240 ml  Output 1000 ml  Net -760 ml   Filed Weights   08/27/20 0500 08/28/20 0423 09/01/20 0500  Weight: 67.5 kg 65.6 kg 62.3 kg    Examination:  Awake Alert, Oriented X 3, he is extremely frail, deconditioned, with muscle wasting, right IJ HD catheter Symmetrical Chest wall movement, Good air movement bilaterally, CTAB RRR,No Gallops,Rubs or new Murmurs, No Parasternal Heave +ve B.Sounds, Abd Soft, No  tenderness, No rebound - guarding or rigidity. No Cyanosis, Clubbing or edema, No new Rash or bruise          Data Reviewed: I have personally reviewed following labs and imaging studies  CBC: Recent Labs  Lab 08/30/20 0059 08/31/20 0050 09/01/20 0139 09/02/20 0816 09/03/20 0054  WBC 3.7* 3.9* 3.8* 5.3 4.7  NEUTROABS 2.3 2.3 2.0 3.0 2.7  HGB 8.0* 8.1* 8.9* 8.5* 8.5*  HCT 24.1* 24.2* 26.7* 26.0* 25.9*  MCV 93.4 94.5 94.0 94.5 93.5  PLT 92* 109* 121* 174 060    Basic Metabolic Panel: Recent Labs  Lab 08/30/20 0059 08/31/20 0050 09/01/20 0139 09/02/20 0412 09/03/20 0054  NA 134* 133* 133* 132* 133*  K 3.4* 3.3* 3.8 3.7 3.8  CL 101 102 101 100 99  CO2 21* 21* 21* 23 25  GLUCOSE 130* 127* 126* 129* 152*  BUN 59* 58* 58* 56* 60*  CREATININE 4.26* 4.38* 4.39* 4.48* 4.67*  CALCIUM 7.5* 8.0* 8.1* 8.5* 8.6*  MG 1.8 1.7 1.8 1.7 1.9    GFR: Estimated Creatinine Clearance: 12.2 mL/min (A) (by C-G formula based on SCr of 4.67 mg/dL (H)).  Liver Function Tests: Recent Labs  Lab 08/30/20 0059 08/31/20 0050 09/01/20 0139 09/02/20 0412  09/03/20 0054  AST _0 ALT _1 35 34  ALKPHOS 45 45 41 57 62  BILITOT 0.6 0.7 0.5 0.5 0.3  PROT 6.5 6.7 6.3* 6.9 7.3  ALBUMIN 2.2* 2.2* 2.1* 2.3* 2.3*    CBG: Recent Labs  Lab 09/02/20 1209 09/02/20 1610 09/02/20 2004 09/03/20 0825 09/03/20 1203  GLUCAP 123* 193* 238* 141* 196*     Recent Results (from the past 240 hour(s))  SARS CORONAVIRUS 2 (TAT 6-24 HRS) Nasopharyngeal Nasopharyngeal Swab     Status: Abnormal   Collection Time: 08/24/20  5:16 PM   Specimen: Nasopharyngeal Swab  Result Value Ref Range Status   SARS Coronavirus 2 POSITIVE (A) NEGATIVE Final    Comment: (NOTE) SARS-CoV-2 target nucleic acids are DETECTED.  The SARS-CoV-2 RNA is generally detectable in upper and lower respiratory specimens during the acute phase of infection. Positive results are indicative of the presence of SARS-CoV-2 RNA. Clinical correlation with patient history and other diagnostic information is  necessary to determine patient infection status. Positive results do not rule out bacterial infection or co-infection with other viruses.  The expected result is Negative.  Fact Sheet for Patients: SugarRoll.be  Fact Sheet for Healthcare Providers: https://www.woods-mathews.com/  This test is not yet approved or cleared by the [place] FDA and  has been authorized for detection and/or diagnosis of SARS-CoV-2 by FDA under an Emergency Use Authorization (EUA). This EUA will remain  in effect (meaning this test can be used) for the duration of the COVID-19 declaration under Section 564(b)(1) of the Act, 21 U. S.C. section 360bbb-3(b)(1), unless the authorization is terminated or revoked sooner.   Performed at [facility], [place] [address], [place], [place] [address]   Culture, Urine     Status: Abnormal   Collection Time: 09/01/20 10:55 AM   Specimen: Urine, Clean Catch  Result Value Ref Range Status   Specimen  Description URINE, CLEAN CATCH  Final   Special Requests   Final    NONE Performed at [facility], [place] [address] [place],  [address]    Culture >=100,000 COLONIES/mL ENTEROBACTER AEROGENES (A)  Final   Report Status 09/03/2020 FINAL  Final   Organism  ID, Bacteria ENTEROBACTER AEROGENES (A)  Final      Susceptibility   Enterobacter aerogenes - MIC*    CEFAZOLIN >=64 RESISTANT Resistant     CEFEPIME <=0.12 SENSITIVE Sensitive     CEFTRIAXONE <=0.25 SENSITIVE Sensitive     CIPROFLOXACIN <=0.25 SENSITIVE Sensitive     GENTAMICIN <=1 SENSITIVE Sensitive     IMIPENEM 2 SENSITIVE Sensitive     NITROFURANTOIN 64 INTERMEDIATE Intermediate     TRIMETH/SULFA <=20 SENSITIVE Sensitive     PIP/TAZO <=4 SENSITIVE Sensitive     * >=100,000 COLONIES/mL ENTEROBACTER AEROGENES      Radiology Studies: DG Abd Portable 1V  Result Date: 09/02/2020 CLINICAL DATA:  Vomiting EXAM: PORTABLE ABDOMEN - 1 VIEW COMPARISON:  08/27/2020 FINDINGS: Nonobstructive bowel gas pattern. Moderate volume of stool within the colon. No gross free intraperitoneal air on supine imaging. Cholecystectomy clips and right hip arthroplasty. Multilevel thoracolumbar compression fractures status post cement augmentation. IMPRESSION: Nonobstructive bowel gas pattern. Electronically Signed   By: Davina Poke D.O.   On: 09/02/2020 13:04     Scheduled Meds:  calcium carbonate  1,000 mg of elemental calcium Per Tube BID WC   chlorhexidine  15 mL Mouth Rinse BID   Chlorhexidine Gluconate Cloth  6 each Topical Q0600   darbepoetin (ARANESP) injection - DIALYSIS  60 mcg Subcutaneous Q Mon-HD   feeding supplement  237 mL Oral TID BM   finasteride  5 mg Oral Daily   heparin injection (subcutaneous)  5,000 Units Subcutaneous Q8H   insulin aspart  0-5 Units Subcutaneous QHS   insulin aspart  0-9 Units Subcutaneous TID WC   Ipratropium-Albuterol  1 puff Inhalation TID   lidocaine  1 patch Transdermal Q24H   magic  mouthwash  10 mL Oral TID   mouth rinse  15 mL Mouth Rinse q12n4p   mirtazapine  15 mg Oral QHS   multivitamin with minerals  1 tablet Oral Daily   senna-docusate  1 tablet Oral BID   sodium bicarbonate  1,300 mg Oral BID   tamsulosin  0.4 mg Oral QHS   Continuous Infusions:  sodium chloride Stopped (08/22/20 1544)     LOS: 23 days   Signature  Phillips Climes M.D on 09/03/2020 at 3:21 PM   -  To page go to [email or web address]

## 2020-09-03 NOTE — TOC Progression Note (Addendum)
Transition of Care [facility]) - Progression Note    Patient Details  Name: PRAISE DOLECKI MRN: [id number] Date of Birth: 12-05-1945  Transition of Care [facility] CM/SW [place], LCSW Phone Number: 09/03/2020, 9:09 AM  Clinical Narrative:    9am-CSW left another voicemail for [place].   4:27pm-CSW contacted facilities again; still no response.    Expected Discharge Plan: Skilled Nursing Facility Barriers to Discharge: SNF Pending bed offer  Expected Discharge Plan and Services Expected Discharge Plan: [place]   Discharge Planning Services: CM Consult Post Acute Care Choice: [place] Living arrangements for the past 2 months: Single Family Home                                       Social Determinants of Health (SDOH) Interventions    Readmission Risk Interventions No flowsheet data found.

## 2020-09-03 NOTE — Progress Notes (Addendum)
  Speech Language Pathology Treatment: Dysphagia  Patient Details Name: Jeff Rodriguez MRN: [id number] DOB: 07/02/1945 Today's Date: 09/03/2020 Time: 3094-0768 SLP Time Calculation (min) (ACUTE ONLY): 11 min  Assessment / Plan / Recommendation Clinical Impression  Pt was seen for dysphagia treatment with his son present, and pt was cooperative throughout the session. Pt, nursing, and his son reported that the pt has been tolerating the current diet without overt s/sx of aspiration. Pt reported that he is no longed having globus sensation and that his swallow function appears to be at baseline. Pt stated that he no longer likes or eats meats since reduced dentition has made mastication of these difficult. Pt tolerated dysphagia 3 solids, regular texture solids, dual consistency boluses (dysphagia 3 and regular solids with thin liquids) and thin liquids via straw using individual and consecutive swallows without s/sx of aspiration. Mastication was functional and oral clearance adequate. It is recommended that the current diet be continued. Further skilled SLP services are not clinically indicated at this time; however, pt and his son have verbalized agreement with completion of a modified barium swallow study if pt begins having increased difficulty with p.o. intake.   HPI HPI: patient is a 75 year old male who was admitted to the hospital on 6/22 with COVID 19 and AKI; at [facility], had initiated Renal Replacement Therapy, transferred to [facility];  past medical history significant for multiple myeloma, diabetes mellitus, hypertension, asthma, hyperlipidemia, osteoarthritis.      SLP Plan  All goals met;Discharge SLP treatment due to (comment)       Recommendations  Diet recommendations: Regular;Thin liquid Liquids provided via: Cup;Straw Medication Administration: Other (Comment) (crush larger PO meds, smaller PO meds as tolerated) Supervision: Patient able to self feed;Intermittent supervision to cue  for compensatory strategies Compensations: Slow rate;Small sips/bites;Effortful swallow;Follow solids with liquid Postural Changes and/or Swallow Maneuvers: Seated upright 90 degrees;Upright 30-60 min after meal                Oral Care Recommendations: Oral care BID Follow up Recommendations: None SLP Visit Diagnosis: Dysphagia, unspecified (R13.10) Plan: All goals met;Discharge SLP treatment due to (comment)       Bryanah Sidell I. Hardin Negus, [place], [place] Office number [phone] Pager [place] 09/03/2020, 1:42 PM

## 2020-09-03 NOTE — Progress Notes (Signed)
Subjective:  no acute events overnight. Son not present this AM. No UOP documented - stool of 1 liter but he says no BMs so I think probably that was 1L of UOP.  No dysgeusia, able to eat dinner - but not robust po intake though this preceeded admission by months.      Objective Vital signs in last 24 hours: Vitals:   09/02/20 2352 09/03/20 0405 09/03/20 0826 09/03/20 1002  BP: 105/65 106/70 (!) 88/45 106/73  Pulse: (!) 101 (!) 102 (!) 109 (!) 105  Resp: $Remo'18 18 16 15  'qgBAS$ Temp: 98.5 F (36.9 C) 98.4 F (36.9 C) 98.4 F (36.9 C) 98.6 F (37 C)  TempSrc: Oral Oral Oral Oral  SpO2: 100% 100% 100% 100%  Weight:      Height:       Weight change:   Intake/Output Summary (Last 24 hours) at 09/03/2020 1118 Last data filed at 09/03/2020 0500 Gross per 24 hour  Intake 240 ml  Output 1000 ml  Net -760 ml    Physical Exam: Gen: nad, awake and alert HEENT: anicteric sclera, MMM Chest: s1s2, rrr Resp: cta bl Abd: soft, nt/nd Msk; no edema Neuro: no tremors/asterixis HD access: RIJ temp HD trialysis c/d/i   Labs: Basic Metabolic Panel: Recent Labs  Lab 09/01/20 0139 09/02/20 0412 09/03/20 0054  NA 133* 132* 133*  K 3.8 3.7 3.8  CL 101 100 99  CO2 21* 23 25  GLUCOSE 126* 129* 152*  BUN 58* 56* 60*  CREATININE 4.39* 4.48* 4.67*  CALCIUM 8.1* 8.5* 8.6*    Liver Function Tests: Recent Labs  Lab 09/01/20 0139 09/02/20 0412 09/03/20 0054  AST $Re'27 31 23  'AZi$ ALT 28 35 34  ALKPHOS 41 57 62  BILITOT 0.5 0.5 0.3  PROT 6.3* 6.9 7.3  ALBUMIN 2.1* 2.3* 2.3*    No results for input(s): LIPASE, AMYLASE in the last 168 hours. No results for input(s): AMMONIA in the last 168 hours.  CBC: Recent Labs  Lab 08/30/20 0059 08/31/20 0050 09/01/20 0139 09/02/20 0816 09/03/20 0054  WBC 3.7* 3.9* 3.8* 5.3 4.7  NEUTROABS 2.3 2.3 2.0 3.0 2.7  HGB 8.0* 8.1* 8.9* 8.5* 8.5*  HCT 24.1* 24.2* 26.7* 26.0* 25.9*  MCV 93.4 94.5 94.0 94.5 93.5  PLT 92* 109* 121* 174 189    Cardiac  Enzymes: No results for input(s): CKTOTAL, CKMB, CKMBINDEX, TROPONINI in the last 168 hours. CBG: Recent Labs  Lab 09/02/20 0756 09/02/20 1209 09/02/20 1610 09/02/20 2004 09/03/20 0825  GLUCAP 143* 123* 193* 238* 141*     Iron Studies:  No results for input(s): IRON, TIBC, TRANSFERRIN, FERRITIN in the last 72 hours.  Studies/Results: DG Abd Portable 1V  Result Date: 09/02/2020 CLINICAL DATA:  Vomiting EXAM: PORTABLE ABDOMEN - 1 VIEW COMPARISON:  08/27/2020 FINDINGS: Nonobstructive bowel gas pattern. Moderate volume of stool within the colon. No gross free intraperitoneal air on supine imaging. Cholecystectomy clips and right hip arthroplasty. Multilevel thoracolumbar compression fractures status post cement augmentation. IMPRESSION: Nonobstructive bowel gas pattern. Electronically Signed   By: Davina Poke D.O.   On: 09/02/2020 13:04   Medications: Infusions:  sodium chloride Stopped (08/22/20 1544)    Scheduled Medications:  calcium carbonate  1,000 mg of elemental calcium Per Tube BID WC   chlorhexidine  15 mL Mouth Rinse BID   Chlorhexidine Gluconate Cloth  6 each Topical Q0600   darbepoetin (ARANESP) injection - DIALYSIS  60 mcg Subcutaneous Q Mon-HD   feeding supplement  237 mL  Oral TID BM   finasteride  5 mg Oral Daily   heparin injection (subcutaneous)  5,000 Units Subcutaneous Q8H   insulin aspart  0-5 Units Subcutaneous QHS   insulin aspart  0-9 Units Subcutaneous TID WC   Ipratropium-Albuterol  1 puff Inhalation TID   lidocaine  1 patch Transdermal Q24H   magic mouthwash  10 mL Oral TID   mouth rinse  15 mL Mouth Rinse q12n4p   mirtazapine  15 mg Oral QHS   multivitamin with minerals  1 tablet Oral Daily   senna-docusate  1 tablet Oral BID   sodium bicarbonate  1,300 mg Oral BID   tamsulosin  0.4 mg Oral QHS    have reviewed scheduled and prn medications.      Assessment/ Plan: Pt is a 75 y.o. yo male with multiple myeloma who was admitted on  08/11/2020 with COVID-  A on CRT-  crt 1.7 in Jan 2022-  now 7- non oliguric   1. A on CRF-  baseline creat 1.7 in Jan 2022, presented with Cr 7.3. Renal u/s neg for obstruction. UA unrevealing.  Suspect ATN from hypotension/ ACEi.  Myeloma kidney unlikely- per ONC w/u current myeloma testing is negative. Initially pt was refusing dialysis, but then changed his mind. Due to decline in responsiveness and persistent azotemia (creat in 7's), pt was started on CRRT 6/29. Pt became more alert and responsive w/ CRRT. CRRT dc'd on 7/02 when pt was transferred to [facility].   -Renal function initially appeared to stabilize but has been slowly worsening in the past few days.  It looks to me like he is slightly prerenal due to poor po intake. I encouraged him to eat and drink (esp drink) liberally today to see if he can sustain.  No current indications for dialysis today.  -We had a long discussion re: dialysis today.  He did home hemo for his wife for 5 years until she passed a few years ago.  Should his renal function continue to worsen he may become dialysis dependent.  He's become more frail in the past few years, months and during this admission.  He isn't ambulatory currently and can transfer only with assistance.  I think dialysis would be challenging and wouldn't give a lot of quality of life but asked him to consider if he would want to try vs do hospice should he become dialysis dependent.  We will continue this discussion as we see how renal function trends -Maintain HD catheter for now 2. HTN/volume-  no vol excess on exam today. Bp acceptable 3. Anemia-  blood transfusion 6/23, Hb in 8s, ordered darbe 60 ug weekly IV started 7/4. Hgb stable 4. Hypocalcemia-    no sxms -   got bolus during hosp. Monitor albumin or ionized cal, replete prn 5. Hx multiple myeloma - no evidence here of active disease, see ONC notes 6. Pancytopenia - per Hem-Onc 7.  COVID 19 infection - sp course of remdesivir 8. Hyponatremia,  mild-stable 9. Metabolic acidosis: nahco3 $RemoveBeforeDE'1300mg'pCSvApDVTNPXQNV$  bid  Dispo - not ready for d/c in light of worsening renal function   Jannifer Hick MD [facility] Pager [phone]

## 2020-09-03 NOTE — Progress Notes (Signed)
Patient urine is mucousy, suprapubic fullness, bladder scan showing 800 cc, will insert Foley catheter. Phillips Climes MD

## 2020-09-04 DIAGNOSIS — E118 Type 2 diabetes mellitus with unspecified complications: Secondary | ICD-10-CM | POA: Diagnosis not present

## 2020-09-04 DIAGNOSIS — N179 Acute kidney failure, unspecified: Secondary | ICD-10-CM | POA: Diagnosis not present

## 2020-09-04 LAB — HEPATITIS B CORE ANTIBODY, TOTAL: Hep B Core Total Ab: NONREACTIVE

## 2020-09-04 LAB — GLUCOSE, CAPILLARY
Glucose-Capillary: 104 mg/dL — ABNORMAL HIGH (ref 70–99)
Glucose-Capillary: 117 mg/dL — ABNORMAL HIGH (ref 70–99)
Glucose-Capillary: 135 mg/dL — ABNORMAL HIGH (ref 70–99)
Glucose-Capillary: 136 mg/dL — ABNORMAL HIGH (ref 70–99)
Glucose-Capillary: 261 mg/dL — ABNORMAL HIGH (ref 70–99)

## 2020-09-04 LAB — CBC
HCT: 25.1 % — ABNORMAL LOW (ref 39.0–52.0)
Hemoglobin: 8.1 g/dL — ABNORMAL LOW (ref 13.0–17.0)
MCH: 30.9 pg (ref 26.0–34.0)
MCHC: 32.3 g/dL (ref 30.0–36.0)
MCV: 95.8 fL (ref 80.0–100.0)
Platelets: 195 10*3/uL (ref 150–400)
RBC: 2.62 MIL/uL — ABNORMAL LOW (ref 4.22–5.81)
RDW: 16.1 % — ABNORMAL HIGH (ref 11.5–15.5)
WBC: 3.7 10*3/uL — ABNORMAL LOW (ref 4.0–10.5)
nRBC: 0 % (ref 0.0–0.2)

## 2020-09-04 LAB — RENAL FUNCTION PANEL
Albumin: 2.4 g/dL — ABNORMAL LOW (ref 3.5–5.0)
Anion gap: 11 (ref 5–15)
BUN: 63 mg/dL — ABNORMAL HIGH (ref 8–23)
CO2: 25 mmol/L (ref 22–32)
Calcium: 9 mg/dL (ref 8.9–10.3)
Chloride: 96 mmol/L — ABNORMAL LOW (ref 98–111)
Creatinine, Ser: 4.72 mg/dL — ABNORMAL HIGH (ref 0.61–1.24)
GFR, Estimated: 12 mL/min — ABNORMAL LOW (ref >60)
Glucose, Bld: 130 mg/dL — ABNORMAL HIGH (ref 70–99)
Phosphorus: 4.2 mg/dL (ref 2.5–4.6)
Potassium: 3.6 mmol/L (ref 3.5–5.1)
Sodium: 132 mmol/L — ABNORMAL LOW (ref 135–145)

## 2020-09-04 LAB — HEPATITIS B SURFACE ANTIGEN: Hepatitis B Surface Ag: NONREACTIVE

## 2020-09-04 LAB — HEPATITIS B SURFACE ANTIBODY,QUALITATIVE: Hep B S Ab: NONREACTIVE

## 2020-09-04 MED ORDER — CHLORHEXIDINE GLUCONATE CLOTH 2 % EX PADS
6.0000 | MEDICATED_PAD | Freq: Every day | CUTANEOUS | Status: DC
  Administered 2020-09-05 – 2020-09-10 (×5): 6 via TOPICAL

## 2020-09-04 MED ORDER — HEPARIN SODIUM (PORCINE) 1000 UNIT/ML DIALYSIS: 1000.0000 [IU] | INTRAMUSCULAR | Status: DC | PRN

## 2020-09-04 MED ORDER — MIDODRINE HCL 5 MG PO TABS
10.0000 mg | ORAL_TABLET | ORAL | Status: DC | PRN
  Filled 2020-09-04: qty 2

## 2020-09-04 MED ORDER — LIDOCAINE HCL (PF) 1 % IJ SOLN: 5.0000 mL | INTRAMUSCULAR | Status: DC | PRN

## 2020-09-04 MED ORDER — MIDODRINE HCL 5 MG PO TABS: 2.5000 mg | ORAL_TABLET | Freq: Three times a day (TID) | ORAL | Status: DC

## 2020-09-04 MED ORDER — SODIUM CHLORIDE 0.9 % IV SOLN: 100.0000 mL | INTRAVENOUS | Status: DC | PRN

## 2020-09-04 MED ORDER — MIDODRINE HCL 5 MG PO TABS
10.0000 mg | ORAL_TABLET | Freq: Two times a day (BID) | ORAL | Status: DC
  Administered 2020-09-04 – 2020-09-11 (×15): 10 mg via ORAL
  Filled 2020-09-04 (×14): qty 2

## 2020-09-04 MED ORDER — ALTEPLASE 2 MG IJ SOLR: 2.0000 mg | Freq: Once | INTRAMUSCULAR | Status: DC | PRN

## 2020-09-04 MED ORDER — HEPARIN SODIUM (PORCINE) 1000 UNIT/ML IJ SOLN
INTRAMUSCULAR | Status: AC
  Filled 2020-09-04: qty 1

## 2020-09-04 MED ORDER — ALBUMIN HUMAN 25 % IV SOLN
25.0000 g | Freq: Once | INTRAVENOUS | Status: AC
  Administered 2020-09-04: 25 g via INTRAVENOUS

## 2020-09-04 MED ORDER — SODIUM CHLORIDE 0.9 % IV BOLUS
1000.0000 mL | Freq: Once | INTRAVENOUS | Status: AC
  Administered 2020-09-04: 1000 mL via INTRAVENOUS

## 2020-09-04 MED ORDER — PENTAFLUOROPROP-TETRAFLUOROETH EX AERO: 1.0000 "application " | INHALATION_SPRAY | CUTANEOUS | Status: DC | PRN

## 2020-09-04 MED ORDER — LIDOCAINE-PRILOCAINE 2.5-2.5 % EX CREA: 1.0000 "application " | TOPICAL_CREAM | CUTANEOUS | Status: DC | PRN

## 2020-09-04 MED ORDER — SODIUM CHLORIDE 0.9 % IV SOLN
1.0000 g | INTRAVENOUS | Status: DC
  Administered 2020-09-04 – 2020-09-11 (×8): 1 g via INTRAVENOUS
  Filled 2020-09-04 (×8): qty 10

## 2020-09-04 MED ORDER — ALBUMIN HUMAN 25 % IV SOLN
INTRAVENOUS | Status: AC
  Filled 2020-09-04: qty 100

## 2020-09-04 NOTE — Progress Notes (Signed)
Subjective:  urinary retention yesterday with 856m on foley insertion.  Renal function stable this AM.  Admits to dysgeusia.  Son present this AM.    Objective Vital signs in last 24 hours: Vitals:   09/04/20 0650 09/04/20 0800 09/04/20 1002 09/04/20 1216  BP: 115/73 113/69 (!) 86/48 106/64  Pulse: 98 100 98 100  Resp: 18     Temp: 98.5 F (36.9 C)  97.9 F (36.6 C) 97.8 F (36.6 C)  TempSrc: Oral  Oral Oral  SpO2: 99% 99% 98% 99%  Weight:      Height:       Weight change:   Intake/Output Summary (Last 24 hours) at 09/04/2020 1250 Last data filed at 09/04/2020 0400 Gross per 24 hour  Intake 100 ml  Output 1500 ml  Net -1400 ml    Physical Exam: Gen: nad, awake and alert HEENT: anicteric sclera, MMM Chest: s1s2, rrr Resp: cta bl Abd: soft, nt/nd Msk; no edema Neuro: no tremors/asterixis HD access: RIJ temp HD trialysis c/d/i   Labs: Basic Metabolic Panel: Recent Labs  Lab 09/02/20 0412 09/03/20 0054 09/04/20 0058  NA 132* 133* 132*  K 3.7 3.8 3.6  CL 100 99 96*  CO2 _0 GLUCOSE 129* 152* 130*  BUN 56* 60* 63*  CREATININE 4.48* 4.67* 4.72*  CALCIUM 8.5* 8.6* 9.0  PHOS  --   --  4.2    Liver Function Tests: Recent Labs  Lab 09/01/20 0139 09/02/20 0412 09/03/20 0054 09/04/20 0058  AST _1 --   ALT 28 35 34  --   ALKPHOS 41 57 62  --   BILITOT 0.5 0.5 0.3  --   PROT 6.3* 6.9 7.3  --   ALBUMIN 2.1* 2.3* 2.3* 2.4*    No results for input(s): LIPASE, AMYLASE in the last 168 hours. No results for input(s): AMMONIA in the last 168 hours.  CBC: Recent Labs  Lab 08/30/20 0059 08/31/20 0050 09/01/20 0139 09/02/20 0816 09/03/20 0054  WBC 3.7* 3.9* 3.8* 5.3 4.7  NEUTROABS 2.3 2.3 2.0 3.0 2.7  HGB 8.0* 8.1* 8.9* 8.5* 8.5*  HCT 24.1* 24.2* 26.7* 26.0* 25.9*  MCV 93.4 94.5 94.0 94.5 93.5  PLT 92* 109* 121* 174 189    Cardiac Enzymes: No results for input(s): CKTOTAL, CKMB, CKMBINDEX, TROPONINI in the last 168 hours. CBG: Recent  Labs  Lab 09/03/20 1203 09/03/20 1621 09/03/20 2039 09/04/20 0810 09/04/20 1218  GLUCAP 196* 230* 135* 135* 117*     Iron Studies:  No results for input(s): IRON, TIBC, TRANSFERRIN, FERRITIN in the last 72 hours.  Studies/Results: No results found. Medications: Infusions:  sodium chloride Stopped (08/22/20 1544)   cefTRIAXone (ROCEPHIN)  IV      Scheduled Medications:  calcium carbonate  1,000 mg of elemental calcium Per Tube BID WC   chlorhexidine  15 mL Mouth Rinse BID   Chlorhexidine Gluconate Cloth  6 each Topical Q0600   [START ON 09/05/2020] Chlorhexidine Gluconate Cloth  6 each Topical Q0600   darbepoetin (ARANESP) injection - DIALYSIS  60 mcg Subcutaneous Q Mon-HD   feeding supplement  237 mL Oral TID BM   finasteride  5 mg Oral Daily   heparin injection (subcutaneous)  5,000 Units Subcutaneous Q8H   insulin aspart  0-5 Units Subcutaneous QHS   insulin aspart  0-9 Units Subcutaneous TID WC   Ipratropium-Albuterol  1 puff Inhalation TID   lidocaine  1 patch Transdermal Q24H   magic mouthwash  10 mL Oral TID   mouth rinse  15 mL Mouth Rinse q12n4p   midodrine  2.5 mg Oral TID WC   mirtazapine  15 mg Oral QHS   multivitamin with minerals  1 tablet Oral Daily   senna-docusate  1 tablet Oral BID   sodium bicarbonate  1,300 mg Oral BID   tamsulosin  0.4 mg Oral QHS    have reviewed scheduled and prn medications.      Assessment/ Plan: Pt is a 75 y.o. yo male with multiple myeloma who was admitted on 08/11/2020 with COVID-  A on CRT-  crt 1.7 in Jan 2022-  now 7- non oliguric   1. A on CRF-  baseline creat 1.7 in Jan 2022, presented with Cr 7.3. Renal u/s neg for obstruction. UA unrevealing.  Suspect ATN from hypotension/ ACEi.  Myeloma kidney unlikely- per ONC w/u current myeloma testing is negative. Initially pt was refusing dialysis, but then changed his mind. Due to decline in responsiveness and persistent azotemia (creat in 7's), pt was started on CRRT 6/29. Pt  became more alert and responsive w/ CRRT. CRRT dc'd on 7/02 when pt was transferred to [facility]. -slowly worsening renal function over past few days, likely retention was contributing -having low grade uremic symptoms and agreeable to HD trial to see if it improves his appetite and condition   -We had a long discussion re: dialysis todayyesterday  He did home hemo for his wife for 5 years until she passed a few years ago.  Should his renal function continue to worsen he may become dialysis dependent.  He's become more frail in the past few years, months and during this admission.  He isn't ambulatory currently and can transfer only with assistance.  I think dialysis would be challenging and wouldn't give a lot of quality of life but asked him to consider if he would want to try vs do hospice should he become dialysis dependent.  We will continue this discussion as we see how renal function trends 2. HTN/volume-  no vol excess on exam today. Bp acceptable 3. Anemia-  blood transfusion 6/23, Hb in 8s, ordered darbe 60 ug weekly IV started 7/4. Hgb stable 4. Hypocalcemia-    no sxms -   got bolus during hosp. Monitor albumin or ionized cal, replete prn 5. Hx multiple myeloma - no evidence here of active disease, see ONC notes 6. Pancytopenia - per Hem-Onc 7.  COVID 19 infection - sp course of remdesivir 8. Hyponatremia, mild-stable 9. Metabolic acidosis: nahco3 1370m bid  Dispo - not ready for d/c in light of worsening renal function and ongoing dialysis need   LJannifer HickMD [facility] Pager [phone]

## 2020-09-04 NOTE — TOC Progression Note (Addendum)
Transition of Care [facility]) - Progression Note    Patient Details  Name: Jeff Rodriguez MRN: [id number] Date of Birth: January 28, 1946  Transition of Care [facility]) CM/SW Burns, LCSW Phone Number: 09/04/2020, 8:44 AM  Clinical Narrative:    CSW faxed updated clinicals to [facility] and sent a HIPPA compliant text to see if that would elicit a response regarding bed availability. CSW also faxed updates to [facility] and left voicemail (p. [phone]. [phone]).    Expected Discharge Plan: Skilled Nursing Facility Barriers to Discharge: SNF Pending bed offer  Expected Discharge Plan and Services Expected Discharge Plan: [place]   Discharge Planning Services: CM Consult Post Acute Care Choice: [place] Living arrangements for the past 2 months: Single Family Home                                       Social Determinants of Health (SDOH) Interventions    Readmission Risk Interventions No flowsheet data found.

## 2020-09-04 NOTE — Progress Notes (Signed)
   09/04/20 0050  Assess: MEWS Score  Temp 98.5 F (36.9 C)  BP 101/75  Pulse Rate (!) 114  ECG Heart Rate (!) 114  Resp 20  SpO2 100 %  O2 Device Room Air  Assess: MEWS Score  MEWS Temp 0  MEWS Systolic 0  MEWS Pulse 2  MEWS RR 0  MEWS LOC 0  MEWS Score 2  MEWS Score Color Yellow  Assess: if the MEWS score is Yellow or Red  Were vital signs taken at a resting state? Yes  Focused Assessment No change from prior assessment  Does the patient meet 2 or more of the SIRS criteria? Yes  Early Detection of Sepsis Score *See Row Information* Low  MEWS guidelines implemented *See Row Information* Yes  Assess: SIRS CRITERIA  SIRS Temperature  0  SIRS Pulse 1  SIRS Respirations  0  SIRS WBC 0  SIRS Score Sum  1

## 2020-09-04 NOTE — Progress Notes (Signed)
Hemodialysis treatment ended due to low BP hovering in 70/s on left and right upper arm. Alerted MD Johnney Ou of low BP and asked for Albumin 25 g. Administered albumin next BP was 82/59 then became 78 systolic. Treatment ended. Floor RN aware. Patient alert oriented ending BP 96/50.

## 2020-09-04 NOTE — Progress Notes (Signed)
PROGRESS NOTE    Jeff Rodriguez  YTK:[id number] DOB: 06/05/1945 DOA: 08/11/2020 PCP: [facility]    Chief Complaint  Patient presents with   Cough    Brief Narrative:    75 y.o. male with medical history significant of multiple myeloma (on [name]), diabetes, hypertension, asthma, hyperlipidemia and osteoarthritis who was brought in by EMS secondary to productive cough, poor oral intake and not eating much.  Patient is currently undergoing chemo for his multiple myeloma. Tested positive for COVID 19, found to be hypokalemic and  in acute renal failure.  Nephrology team following.  in view of severe thrombocytopenia, oncology consulted and neupogen given. Bone scan 08/18/2020 negative for lytic lesions.  Due to poor prognosis, palliative care team consulted and patient decided for CRRT.  Per nephrology patient was transferred to [facility] for HD.   He has received 2 units of prbc transfusion so far.   His anemia, thrombocytopenia and renal function have all stabilized, his main issue was poor oral intake and not doing well at home, with addition of Remeron his oral intake has improved, medically now much improved, will require SNF.     Subjective:    Patient with urinary retention yesterday required Foley catheter insertion, he is himself today denies any complaints, he remains with poor appetite and intermittent nausea, he had some vomiting yesterday evening.   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   - He has a right IJ HD catheter , for now we will continue to keep the catheter per renal recommendation, need  to be discontinued before discharge . -Unclear if patient will need long-term hemodialysis or not, this is assessed daily by renal depends on his urine output and renal function, discussed with renal, final decision about hemodialysis yet, but recommendation is to keep the right IJ HD catheter for now, creatinine continue to trend up slowly, as well as BUN. -He is with urinary retention yesterday, will monitor closely hoping it would improve after Foley catheter was inserted. -Patient blood pressure remains soft, will start on low-dose midodrine.  Multiple myeloma  - Dr. Ennever/oncology on board. Bone scan does not show any lytic lesions at this time.  Urinary retention -800 cc on bladder scan 7/14, Foley catheter inserted, urine is very cloudy, UA was obtained, showing pyuria, will start on Rocephin to treat as discussed with renal, will follow urine cultures.  Anemia of chronic disease  - Transfuse to keep hemoglobin greater than 7, type screen and monitor done.  Counts much improved.  Lab Results  Component Value Date   WBC 4.7 09/03/2020   HGB 8.5 (L) 09/03/2020   HCT 25.9 (L) 09/03/2020   MCV 93.5 09/03/2020   PLT 189 09/03/2020    Underlying depression and oral intake being poor for several weeks according to the son.  Not suicidal homicidal.  Placed on Remeron and monitor.    Severe thrombocytopenia - due to multiple myeloma counts much improved.  Acute metabolic encephalopathy  -All mentation has improved, patient is answering questions appropriately ,. Initial CT of the head is negative for acute intracranial abnormalities.TSH, vitamin  B12 normal, ammonia level within normal limits, this problem is almost completely resolved.  COVID 19 virus infection. Completed the course of remdesivir, continue with flutter valve and incentive spirometer and inhalers as needed. Therapy evaluations recommending SNF. He is off isolation.  Hypokalemia and hypomagnesemia.  Both  replaced.  Hypertension: Well controlled.   Hyperlipidemia - resume home dose statin once acute issues better.  Severe PCM.  He had NG tube with tube feeds, NG accidentally pulled out on 08/26/2020, monitor on oral protein supplements and oral diet.  Right shoulder blade muscle cramps.  Tramadol and 1 dose of baclofen.   Dysuria 08/31/20 - UA looks dirty, get UC, 1 dose Fosfomycin and monitor.  Type 2 diabetes mellitus -labile sugars due to labile oral intake, monitor on low-dose sliding scale  CBG (last 3)  Recent Labs    09/03/20 2039 09/04/20 0810 09/04/20 1218  GLUCAP 135* 135* 117*      Pressure injury on the back.  Pressure Injury 08/21/20 Coccyx Right;Left;Mid Stage 2 -  Partial thickness loss of dermis presenting as a shallow open injury with a red, pink wound bed without slough. (Active)  08/21/20 2000  Location: Coccyx  Location Orientation: Right;Left;Mid  Staging: Stage 2 -  Partial thickness loss of dermis presenting as a shallow open injury with a red, pink wound bed without slough.  Wound Description (Comments):   Present on Admission:      Pressure Injury 08/24/20 Coccyx Medial Stage 2 -  Partial thickness loss of dermis presenting as a shallow open injury with a red, pink wound bed without slough. pink open (Active)  08/24/20 0910  Location: Coccyx  Location Orientation: Medial  Staging: Stage 2 -  Partial thickness loss of dermis presenting as a shallow open injury with a red, pink wound bed without slough.  Wound Description (Comments): pink open  Present on Admission: No   Wound care  will be consulted.    Failure to thrive - Dietary will be consulted, PT -OT   In view of multiple comorbidities, clinical deterioration, hemodialysis dependent, AKI, poor progression palliative care consulted for goals of care discussion.    DVT prophylaxis: [place] heparin Code Status: DNR - discussed with patient in detail on 08/28/2020 wants to be DNR, does not want a  feeding tube Family Communication:  D/W son at bedside Disposition:   Status is: Inpatient  Remains inpatient appropriate because:Ongoing diagnostic testing needed not appropriate for outpatient work up and Unsafe d/c plan  Dispo: The patient is from: Home              Anticipated d/c is to: SNF              Patient currently is not medically stable to d/c.   Difficult to place patient No   Consultants:  Renal, Oncology, Pall Care  Procedures: none.   Antimicrobials:  Antibiotics Given (last 72 hours)     Date/Time Action Medication Dose   09/01/20 1333 Given   fosfomycin (MONUROL) packet 3 g 3 g         Objective: Vitals:   09/04/20 0650 09/04/20 0800 09/04/20 1002 09/04/20 1216  BP: 115/73 113/69 (!) 86/48 106/64  Pulse: 98 100 98 100  Resp: 18     Temp: 98.5 F (36.9 C)  97.9 F (36.6 C) 97.8 F (36.6 C)  TempSrc: Oral  Oral Oral  SpO2: 99% 99% 98% 99%  Weight:      Height:  Intake/Output Summary (Last 24 hours) at 09/04/2020 1237 Last data filed at 09/04/2020 0400 Gross per 24 hour  Intake 100 ml  Output 1500 ml  Net -1400 ml   Filed Weights   08/28/20 0423 09/01/20 0500 09/04/20 0500  Weight: 65.6 kg 62.3 kg 62.1 kg    Examination:  Awake Alert, Oriented X 3, he is extremely frail, deconditioned, with muscle wasting, right IJ HD catheter Symmetrical Chest wall movement, Good air movement bilaterally, CTAB RRR,No Gallops,Rubs or new Murmurs, No Parasternal Heave +ve B.Sounds, Abd Soft, No tenderness, No rebound - guarding or rigidity. No Cyanosis, Clubbing or edema, No new Rash or bruise, significant muscle wasting.         Data Reviewed: I have personally reviewed following labs and imaging studies  CBC: Recent Labs  Lab 08/30/20 0059 08/31/20 0050 09/01/20 0139 09/02/20 0816 09/03/20 0054  WBC 3.7* 3.9* 3.8* 5.3 4.7  NEUTROABS 2.3 2.3 2.0 3.0 2.7  HGB 8.0* 8.1* 8.9* 8.5* 8.5*  HCT 24.1* 24.2* 26.7* 26.0* 25.9*  MCV 93.4  94.5 94.0 94.5 93.5  PLT 92* 109* 121* 174 902    Basic Metabolic Panel: Recent Labs  Lab 08/30/20 0059 08/31/20 0050 09/01/20 0139 09/02/20 0412 09/03/20 0054 09/04/20 0058  NA 134* 133* 133* 132* 133* 132*  K 3.4* 3.3* 3.8 3.7 3.8 3.6  CL 101 102 101 100 99 96*  CO2 21* 21* 21* $Remov'23 25 25  'UVCIiO$ GLUCOSE 130* 127* 126* 129* 152* 130*  BUN 59* 58* 58* 56* 60* 63*  CREATININE 4.26* 4.38* 4.39* 4.48* 4.67* 4.72*  CALCIUM 7.5* 8.0* 8.1* 8.5* 8.6* 9.0  MG 1.8 1.7 1.8 1.7 1.9  --   PHOS  --   --   --   --   --  4.2    GFR: Estimated Creatinine Clearance: 12.1 mL/min (A) (by C-G formula based on SCr of 4.72 mg/dL (H)).  Liver Function Tests: Recent Labs  Lab 08/30/20 0059 08/31/20 0050 09/01/20 0139 09/02/20 0412 09/03/20 0054 09/04/20 0058  AST $Re'24 18 27 31 23  'qsE$ --   ALT $Re'30 26 28 'Dwc$ 35 34  --   ALKPHOS 45 45 41 57 62  --   BILITOT 0.6 0.7 0.5 0.5 0.3  --   PROT 6.5 6.7 6.3* 6.9 7.3  --   ALBUMIN 2.2* 2.2* 2.1* 2.3* 2.3* 2.4*    CBG: Recent Labs  Lab 09/03/20 1203 09/03/20 1621 09/03/20 2039 09/04/20 0810 09/04/20 1218  GLUCAP 196* 230* 135* 135* 117*     Recent Results (from the past 240 hour(s))  Culture, Urine     Status: Abnormal   Collection Time: 09/01/20 10:55 AM   Specimen: Urine, Clean Catch  Result Value Ref Range Status   Specimen Description URINE, CLEAN CATCH  Final   Special Requests   Final    NONE Performed at [facility], [place] [address], [place], [place] [address]    Culture >=100,000 COLONIES/mL ENTEROBACTER AEROGENES (A)  Final   Report Status 09/03/2020 FINAL  Final   Organism ID, Bacteria ENTEROBACTER AEROGENES (A)  Final      Susceptibility   Enterobacter aerogenes - MIC*    CEFAZOLIN >=64 RESISTANT Resistant     CEFEPIME <=0.12 SENSITIVE Sensitive     CEFTRIAXONE <=0.25 SENSITIVE Sensitive     CIPROFLOXACIN <=0.25 SENSITIVE Sensitive     GENTAMICIN <=1 SENSITIVE Sensitive     IMIPENEM 2 SENSITIVE Sensitive     NITROFURANTOIN 64  INTERMEDIATE Intermediate  TRIMETH/SULFA <=20 SENSITIVE Sensitive     PIP/TAZO <=4 SENSITIVE Sensitive     * >=100,000 COLONIES/mL ENTEROBACTER AEROGENES      Radiology Studies: No results found.   Scheduled Meds:  calcium carbonate  1,000 mg of elemental calcium Per Tube BID WC   chlorhexidine  15 mL Mouth Rinse BID   Chlorhexidine Gluconate Cloth  6 each Topical Q0600   darbepoetin (ARANESP) injection - DIALYSIS  60 mcg Subcutaneous Q Mon-HD   feeding supplement  237 mL Oral TID BM   finasteride  5 mg Oral Daily   heparin injection (subcutaneous)  5,000 Units Subcutaneous Q8H   insulin aspart  0-5 Units Subcutaneous QHS   insulin aspart  0-9 Units Subcutaneous TID WC   Ipratropium-Albuterol  1 puff Inhalation TID   lidocaine  1 patch Transdermal Q24H   magic mouthwash  10 mL Oral TID   mouth rinse  15 mL Mouth Rinse q12n4p   mirtazapine  15 mg Oral QHS   multivitamin with minerals  1 tablet Oral Daily   senna-docusate  1 tablet Oral BID   sodium bicarbonate  1,300 mg Oral BID   tamsulosin  0.4 mg Oral QHS   Continuous Infusions:  sodium chloride Stopped (08/22/20 1544)   cefTRIAXone (ROCEPHIN)  IV       LOS: 24 days   Signature  Phillips Climes M.D on 09/04/2020 at 12:37 PM   -  To page go to [email or web address]

## 2020-09-05 DIAGNOSIS — U071 COVID-19: Secondary | ICD-10-CM | POA: Diagnosis not present

## 2020-09-05 DIAGNOSIS — N179 Acute kidney failure, unspecified: Secondary | ICD-10-CM | POA: Diagnosis not present

## 2020-09-05 LAB — RENAL FUNCTION PANEL
Albumin: 2.5 g/dL — ABNORMAL LOW (ref 3.5–5.0)
Anion gap: 8 (ref 5–15)
BUN: 49 mg/dL — ABNORMAL HIGH (ref 8–23)
CO2: 26 mmol/L (ref 22–32)
Calcium: 8.4 mg/dL — ABNORMAL LOW (ref 8.9–10.3)
Chloride: 99 mmol/L (ref 98–111)
Creatinine, Ser: 3.84 mg/dL — ABNORMAL HIGH (ref 0.61–1.24)
GFR, Estimated: 16 mL/min — ABNORMAL LOW (ref >60)
Glucose, Bld: 122 mg/dL — ABNORMAL HIGH (ref 70–99)
Phosphorus: 2.7 mg/dL (ref 2.5–4.6)
Potassium: 3.2 mmol/L — ABNORMAL LOW (ref 3.5–5.1)
Sodium: 133 mmol/L — ABNORMAL LOW (ref 135–145)

## 2020-09-05 LAB — GLUCOSE, CAPILLARY
Glucose-Capillary: 128 mg/dL — ABNORMAL HIGH (ref 70–99)
Glucose-Capillary: 122 mg/dL — ABNORMAL HIGH (ref 70–99)
Glucose-Capillary: 136 mg/dL — ABNORMAL HIGH (ref 70–99)
Glucose-Capillary: 361 mg/dL — ABNORMAL HIGH (ref 70–99)
Glucose-Capillary: 251 mg/dL — ABNORMAL HIGH (ref 70–99)
Glucose-Capillary: 143 mg/dL — ABNORMAL HIGH (ref 70–99)

## 2020-09-05 MED ORDER — POLYETHYLENE GLYCOL 3350 17 G PO PACK
17.0000 g | PACK | Freq: Two times a day (BID) | ORAL | Status: AC
  Administered 2020-09-05 – 2020-09-06 (×2): 17 g via ORAL
  Filled 2020-09-05 (×2): qty 1

## 2020-09-05 MED ORDER — SODIUM BICARBONATE 650 MG PO TABS
650.0000 mg | ORAL_TABLET | Freq: Two times a day (BID) | ORAL | Status: DC
  Administered 2020-09-05 – 2020-09-06 (×2): 650 mg via ORAL
  Filled 2020-09-05 (×2): qty 1

## 2020-09-05 NOTE — Progress Notes (Signed)
PROGRESS NOTE    Jeff Rodriguez  BWG:[id number] DOB: [birth year] DOA: 08/11/2020 PCP: [facility]    Chief Complaint  Patient presents with   Cough    Brief Narrative:    75 y.o. male with medical history significant of multiple myeloma (on [name]), diabetes, hypertension, asthma, hyperlipidemia and osteoarthritis who was brought in by EMS secondary to productive cough, poor oral intake and not eating much.  Patient is currently undergoing chemo for his multiple myeloma. Tested positive for COVID 19, found to be hypokalemic and  in acute renal failure.  Nephrology team following.  in view of severe thrombocytopenia, oncology consulted and neupogen given. Bone scan 08/18/2020 negative for lytic lesions.  Due to poor prognosis, palliative care team consulted and patient decided for CRRT.  Per nephrology patient was transferred to [facility] for HD.   He has received 2 units of prbc transfusion so far.   His anemia, thrombocytopenia and renal function have all stabilized, his main issue was poor oral intake and not doing well at home, with addition of Remeron his oral intake has improved, medically now much improved, will require SNF.     Subjective:    Patient hemodialysis yesterday was stopped due to hypotension, urine output 1.6 L yesterday, reports last bowel movement was 3 days ago, he does report some abdomen discomfort.  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   - He has a right IJ HD catheter , for now we will continue to keep the catheter per renal recommendation, need to be discontinued before discharge  . -Unclear if patient will need long-term hemodialysis or not, this is assessed daily by renal depends on his urine output and renal function, discussed with renal, final decision about hemodialysis yet, but recommendation is to keep the right IJ HD catheter for now, creatinine continue to trend up slowly, as well as BUN. -He is with urinary retention Foley catheter inserted 7/15. -Patient was tried hemodialysis yesterday, which was stopped due to hypotension. -Patient received IV fluid bolus, albumin during HD session, and he was kept on IV fluids after that, renal function has improved, patient is encouraged to increase his fluid intake by renal.. -His midodrine was increased given soft blood pressure.  Multiple myeloma  - Dr. Ennever/oncology on board. Bone scan does not show any lytic lesions at this time.  Urinary retention -800 cc on bladder scan 7/14, Foley catheter inserted, urine is very cloudy, UA was obtained, showing pyuria, continue with Rocephin, follow on urine cultures. Anemia of chronic disease  - Transfuse to keep hemoglobin greater than 7, type screen and monitor done.  Counts much improved.  Lab Results  Component Value Date   WBC 3.7 (L) 09/04/2020   HGB 8.1 (L) 09/04/2020   HCT 25.1 (L) 09/04/2020   MCV 95.8 09/04/2020   PLT 195 09/04/2020    Underlying depression and oral intake being poor for several weeks according to the son.  Not suicidal homicidal.  Placed on Remeron and monitor.    Severe thrombocytopenia - due to multiple myeloma counts much improved.  Acute metabolic encephalopathy  -All  mentation has improved, patient is answering questions appropriately ,. Initial CT of the head is negative for acute intracranial abnormalities.TSH, vitamin B12 normal, ammonia level within normal limits, this problem is almost completely resolved.  COVID 19 virus infection. Completed the course of remdesivir, continue with flutter valve and incentive spirometer and  inhalers as needed. Therapy evaluations recommending SNF. He is off isolation.  Hypokalemia and hypomagnesemia.  Both replaced.  Hypertension: Well controlled.   Hyperlipidemia - resume home dose statin once acute issues better.  Severe PCM.  He had NG tube with tube feeds, NG accidentally pulled out on 08/26/2020, monitor on oral protein supplements and oral diet.  Right shoulder blade muscle cramps.  Tramadol and 1 dose of baclofen.   Dysuria 08/31/20 - UA looks dirty, get UC, 1 dose Fosfomycin and monitor.  Type 2 diabetes mellitus -labile sugars due to labile oral intake, monitor on low-dose sliding scale  CBG (last 3)  Recent Labs    09/05/20 0530 09/05/20 0757 09/05/20 1232  GLUCAP 122* 136* 361*      Pressure injury on the back.  Pressure Injury 08/21/20 Coccyx Right;Left;Mid Stage 2 -  Partial thickness loss of dermis presenting as a shallow open injury with a red, pink wound bed without slough. (Active)  08/21/20 2000  Location: Coccyx  Location Orientation: Right;Left;Mid  Staging: Stage 2 -  Partial thickness loss of dermis presenting as a shallow open injury with a red, pink wound bed without slough.  Wound Description (Comments):   Present on Admission:      Pressure Injury 08/24/20 Coccyx Medial Stage 2 -  Partial thickness loss of dermis presenting as a shallow open injury with a red, pink wound bed without slough. pink open (Active)  08/24/20 0910  Location: Coccyx  Location Orientation: Medial  Staging: Stage 2 -  Partial thickness loss of dermis presenting as a shallow open injury with a red, pink wound bed without slough.  Wound Description (Comments): pink open  Present on Admission: No   Wound care   consulted.    Failure to thrive - Dietary will be consulted, PT -OT   In view of multiple comorbidities, clinical deterioration, hemodialysis dependent, AKI, poor progression palliative care consulted for goals of care discussion.    DVT  prophylaxis: [place] heparin Code Status: DNR - discussed with patient in detail on 08/28/2020 wants to be DNR, does not want a feeding tube Family Communication:  D/W son at bedside 7/15 Disposition:   Status is: Inpatient  Remains inpatient appropriate because:Ongoing diagnostic testing needed not appropriate for outpatient work up and Unsafe d/c plan  Dispo: The patient is from: Home              Anticipated d/c is to: SNF              Patient currently is not medically stable to d/c.   Difficult to place patient No   Consultants:  Renal, Oncology, Pall Care  Procedures: none.   Antimicrobials:  Antibiotics Given (last 72 hours)     Date/Time Action Medication Dose Rate   09/04/20 1632 New Bag/Given   cefTRIAXone (ROCEPHIN) 1 g in sodium chloride 0.9 % 100 mL IVPB 1 g 200 mL/hr   09/05/20 1347 New Bag/Given   cefTRIAXone (ROCEPHIN) 1 g in sodium chloride 0.9 % 100 mL IVPB 1 g 200 mL/hr         Objective: Vitals:   09/05/20 0652 09/05/20 0755 09/05/20 0856 09/05/20 1233  BP: 114/66  123/75 129/71 111/64  Pulse: 100 90 82 90  Resp: _0 Temp: 98.1 F (36.7 C) 97.8 F (36.6 C) 98.7 F (37.1 C) 98.3 F (36.8 C)  TempSrc: Oral Oral Oral Oral  SpO2: 98% 98% 99% 98%  Weight:      Height:        Intake/Output Summary (Last 24 hours) at 09/05/2020 1428 Last data filed at 09/05/2020 0622 Gross per 24 hour  Intake 120 ml  Output 1105 ml  Net -985 ml   Filed Weights   09/04/20 1438 09/04/20 1520 09/05/20 0354  Weight: 61.7 kg (P) 61.7 kg 61.7 kg    Examination:  Awake Alert, Oriented X 3, it is frail, deconditioned, with muscle wasting, right IJ HD catheter Symmetrical Chest wall movement, Good air movement bilaterally, CTAB RRR,No Gallops,Rubs or new Murmurs, No Parasternal Heave +ve B.Sounds, Abd Soft, No tenderness, No rebound - guarding or rigidity. No Cyanosis, Clubbing or edema, No new Rash or bruise           Data Reviewed: I have personally  reviewed following labs and imaging studies  CBC: Recent Labs  Lab 08/30/20 0059 08/31/20 0050 09/01/20 0139 09/02/20 0816 09/03/20 0054 09/04/20 1405  WBC 3.7* 3.9* 3.8* 5.3 4.7 3.7*  NEUTROABS 2.3 2.3 2.0 3.0 2.7  --   HGB 8.0* 8.1* 8.9* 8.5* 8.5* 8.1*  HCT 24.1* 24.2* 26.7* 26.0* 25.9* 25.1*  MCV 93.4 94.5 94.0 94.5 93.5 95.8  PLT 92* 109* 121* 174 189 078    Basic Metabolic Panel: Recent Labs  Lab 08/30/20 0059 08/31/20 0050 09/01/20 0139 09/02/20 0412 09/03/20 0054 09/04/20 0058 09/05/20 0116  NA 134* 133* 133* 132* 133* 132* 133*  K 3.4* 3.3* 3.8 3.7 3.8 3.6 3.2*  CL 101 102 101 100 99 96* 99  CO2 21* 21* 21* _1 GLUCOSE 130* 127* 126* 129* 152* 130* 122*  BUN 59* 58* 58* 56* 60* 63* 49*  CREATININE 4.26* 4.38* 4.39* 4.48* 4.67* 4.72* 3.84*  CALCIUM 7.5* 8.0* 8.1* 8.5* 8.6* 9.0 8.4*  MG 1.8 1.7 1.8 1.7 1.9  --   --   PHOS  --   --   --   --   --  4.2 2.7    GFR: Estimated Creatinine Clearance: 14.7 mL/min (A) (by C-G formula based on SCr of 3.84 mg/dL (H)).  Liver Function Tests: Recent Labs  Lab 08/30/20 0059 08/31/20 0050 09/01/20 0139 09/02/20 0412 09/03/20 0054 09/04/20 0058 09/05/20 0116  AST _2 --   --   ALT _3 35 34  --   --   ALKPHOS 45 45 41 57 62  --   --   BILITOT 0.6 0.7 0.5 0.5 0.3  --   --   PROT 6.5 6.7 6.3* 6.9 7.3  --   --   ALBUMIN 2.2* 2.2* 2.1* 2.3* 2.3* 2.4* 2.5*    CBG: Recent Labs  Lab 09/04/20 2341 09/05/20 0353 09/05/20 0530 09/05/20 0757 09/05/20 1232  GLUCAP 136* 128* 122* 136* 361*     Recent Results (from the past 240 hour(s))  Culture, Urine     Status: Abnormal   Collection Time: 09/01/20 10:55 AM   Specimen: Urine, Clean Catch  Result Value Ref Range Status   Specimen Description URINE, CLEAN CATCH  Final   Special Requests   Final    NONE Performed at [facility], [address],  [place] [address]    Culture >=100,000 COLONIES/mL ENTEROBACTER AEROGENES  (A)  Final   Report Status 09/03/2020 FINAL  Final   Organism ID, Bacteria ENTEROBACTER AEROGENES (A)  Final      Susceptibility   Enterobacter aerogenes - MIC*    CEFAZOLIN >=64 RESISTANT Resistant     CEFEPIME <=0.12 SENSITIVE Sensitive     CEFTRIAXONE <=0.25 SENSITIVE Sensitive     CIPROFLOXACIN <=0.25 SENSITIVE Sensitive     GENTAMICIN <=1 SENSITIVE Sensitive     IMIPENEM 2 SENSITIVE Sensitive     NITROFURANTOIN 64 INTERMEDIATE Intermediate     TRIMETH/SULFA <=20 SENSITIVE Sensitive     PIP/TAZO <=4 SENSITIVE Sensitive     * >=100,000 COLONIES/mL ENTEROBACTER AEROGENES  Urine Culture     Status: Abnormal (Preliminary result)   Collection Time: 09/04/20 11:58 AM   Specimen: Urine, Catheterized  Result Value Ref Range Status   Specimen Description URINE, CATHETERIZED  Final   Special Requests   Final    NONE Performed at [facility], [address], [place], [place] [address]    Culture 40,000 COLONIES/mL ENTEROBACTER AEROGENES (A)  Final   Report Status PENDING  Incomplete      Radiology Studies: No results found.   Scheduled Meds:  calcium carbonate  1,000 mg of elemental calcium Per Tube BID WC   chlorhexidine  15 mL Mouth Rinse BID   Chlorhexidine Gluconate Cloth  6 each Topical Q0600   Chlorhexidine Gluconate Cloth  6 each Topical Q0600   darbepoetin (ARANESP) injection - DIALYSIS  60 mcg Subcutaneous Q Mon-HD   feeding supplement  237 mL Oral TID BM   finasteride  5 mg Oral Daily   heparin injection (subcutaneous)  5,000 Units Subcutaneous Q8H   insulin aspart  0-5 Units Subcutaneous QHS   insulin aspart  0-9 Units Subcutaneous TID WC   lidocaine  1 patch Transdermal Q24H   magic mouthwash  10 mL Oral TID   mouth rinse  15 mL Mouth Rinse q12n4p   midodrine  10 mg Oral BID WC   mirtazapine  15 mg Oral QHS   multivitamin with minerals  1 tablet Oral Daily   senna-docusate  1 tablet Oral BID   sodium bicarbonate  650 mg Oral BID   tamsulosin  0.4 mg  Oral QHS   Continuous Infusions:  sodium chloride Stopped (08/22/20 1544)   cefTRIAXone (ROCEPHIN)  IV 1 g (09/05/20 1347)     LOS: 25 days   Signature  Phillips Climes M.D on 09/05/2020 at 2:28 PM   -  To page go to [email or web address]

## 2020-09-05 NOTE — Progress Notes (Signed)
Subjective:  30 min of low flow HD yesterday stopped due to hypotension - given 1L NS and midodrine.  UOP 1.6L yesterday, labs look better this AM. He feels better.  Feels like he can hydrate.    Objective Vital signs in last 24 hours: Vitals:   09/05/20 0354 09/05/20 0355 09/05/20 0652 09/05/20 0755  BP:  111/65 114/66 123/75  Pulse:  92 100 90  Resp:  $Remo'17 17 17  'feUkf$ Temp:  98.2 F (36.8 C) 98.1 F (36.7 C) 97.8 F (36.6 C)  TempSrc:  Oral Oral Oral  SpO2:  100% 98% 98%  Weight: 61.7 kg     Height:       Weight change: -0.4 kg  Intake/Output Summary (Last 24 hours) at 09/05/2020 0825 Last data filed at 09/05/2020 0622 Gross per 24 hour  Intake 120 ml  Output 1105 ml  Net -985 ml    Physical Exam: Gen: nad, awake and alert HEENT: anicteric sclera, MMM Chest: s1s2, rrr Resp: cta bl Abd: soft, nt/nd Msk; no edema Neuro: no tremors/asterixis HD access: RIJ temp HD trialysis c/d/i   Labs: Basic Metabolic Panel: Recent Labs  Lab 09/03/20 0054 09/04/20 0058 09/05/20 0116  NA 133* 132* 133*  K 3.8 3.6 3.2*  CL 99 96* 99  CO2 $Re'25 25 26  'TpD$ GLUCOSE 152* 130* 122*  BUN 60* 63* 49*  CREATININE 4.67* 4.72* 3.84*  CALCIUM 8.6* 9.0 8.4*  PHOS  --  4.2 2.7    Liver Function Tests: Recent Labs  Lab 09/01/20 0139 09/02/20 0412 09/03/20 0054 09/04/20 0058 09/05/20 0116  AST $Re'27 31 23  'tPY$ --   --   ALT 28 35 34  --   --   ALKPHOS 41 57 62  --   --   BILITOT 0.5 0.5 0.3  --   --   PROT 6.3* 6.9 7.3  --   --   ALBUMIN 2.1* 2.3* 2.3* 2.4* 2.5*    No results for input(s): LIPASE, AMYLASE in the last 168 hours. No results for input(s): AMMONIA in the last 168 hours.  CBC: Recent Labs  Lab 08/31/20 0050 09/01/20 0139 09/02/20 0816 09/03/20 0054 09/04/20 1405  WBC 3.9* 3.8* 5.3 4.7 3.7*  NEUTROABS 2.3 2.0 3.0 2.7  --   HGB 8.1* 8.9* 8.5* 8.5* 8.1*  HCT 24.2* 26.7* 26.0* 25.9* 25.1*  MCV 94.5 94.0 94.5 93.5 95.8  PLT 109* 121* 174 189 195    Cardiac Enzymes: No  results for input(s): CKTOTAL, CKMB, CKMBINDEX, TROPONINI in the last 168 hours. CBG: Recent Labs  Lab 09/04/20 2043 09/04/20 2341 09/05/20 0353 09/05/20 0530 09/05/20 0757  GLUCAP 104* 136* 128* 122* 136*     Iron Studies:  No results for input(s): IRON, TIBC, TRANSFERRIN, FERRITIN in the last 72 hours.  Studies/Results: No results found. Medications: Infusions:  sodium chloride Stopped (08/22/20 1544)   cefTRIAXone (ROCEPHIN)  IV 1 g (09/04/20 1632)    Scheduled Medications:  calcium carbonate  1,000 mg of elemental calcium Per Tube BID WC   chlorhexidine  15 mL Mouth Rinse BID   Chlorhexidine Gluconate Cloth  6 each Topical Q0600   Chlorhexidine Gluconate Cloth  6 each Topical Q0600   darbepoetin (ARANESP) injection - DIALYSIS  60 mcg Subcutaneous Q Mon-HD   feeding supplement  237 mL Oral TID BM   finasteride  5 mg Oral Daily   heparin injection (subcutaneous)  5,000 Units Subcutaneous Q8H   insulin aspart  0-5 Units Subcutaneous QHS  insulin aspart  0-9 Units Subcutaneous TID WC   lidocaine  1 patch Transdermal Q24H   magic mouthwash  10 mL Oral TID   mouth rinse  15 mL Mouth Rinse q12n4p   midodrine  10 mg Oral BID WC   mirtazapine  15 mg Oral QHS   multivitamin with minerals  1 tablet Oral Daily   senna-docusate  1 tablet Oral BID   sodium bicarbonate  1,300 mg Oral BID   tamsulosin  0.4 mg Oral QHS    have reviewed scheduled and prn medications.      Assessment/ Plan: Pt is a 75 y.o. yo male with multiple myeloma who was admitted on 08/11/2020 with COVID-  A on CRT-  crt 1.7 in Jan 2022-  now 7- non oliguric   1. A on CRF-  baseline creat 1.7 in Jan 2022, presented with Cr 7.3. Renal u/s neg for obstruction. UA unrevealing.  Suspect ATN from hypotension/ ACEi.  Myeloma kidney unlikely- per ONC w/u current myeloma testing is negative. Initially pt was refusing dialysis, but then changed his mind. Due to decline in responsiveness and persistent azotemia  (creat in 7's), pt was started on CRRT 6/29. Pt became more alert and responsive w/ CRRT. CRRT dc'd on 7/02 when pt was transferred to [facility]. -slowly worsening renal function over past few days, perhaps some low grade uremic symptoms and we tried HD 8/75 which was complicated by hypotension - rec'd only 75min of low efficiency HD -this AM labs remarkably improved after 1L NS and midodrine (along with foley that was placed PM of 7/14 for retention 864mL) -hold HD today -Push fluids  -We had a long discussion re: dialysis today 75 yesterday  He did home hemo for his wife for 5 years until she passed a few years ago.  Should his renal function continue to worsen he may become dialysis dependent.  He's become more frail in the past few years, months and during this admission.  He isn't ambulatory currently and can transfer only with assistance.  I think dialysis would be challenging and wouldn't give a lot of quality of life but asked him to consider if he would want to try vs do hospice should he become dialysis dependent.  We will continue this discussion as we see how renal function trends 2. HTN/volume-  Improved BP after fluid bolus.  Push po fluids. On midodrine for now.  3. Anemia-  blood transfusion 6/23, Hb in 8s, ordered darbe 60 ug weekly IV started 7/4. Hgb stable 4. Hypocalcemia-    no sxms -   got bolus during hosp. Monitor albumin or ionized cal, replete prn 5. Hx multiple myeloma - no evidence here of active disease, see ONC notes 6. Pancytopenia - per Hem-Onc 7.  COVID 19 infection - sp course of remdesivir 8. Hyponatremia, mild-stable 9. Metabolic acidosis: nahco3 $RemoveBeforeDE'1300mg'[id number]$  bid - dec to 650 BID  Dispo - not ready for d/c until renal function stable or committed to HD - should be able to clear this up early in the week   Jannifer Hick MD [place] Pager [phone]

## 2020-09-06 DIAGNOSIS — E118 Type 2 diabetes mellitus with unspecified complications: Secondary | ICD-10-CM | POA: Diagnosis not present

## 2020-09-06 DIAGNOSIS — N179 Acute kidney failure, unspecified: Secondary | ICD-10-CM | POA: Diagnosis not present

## 2020-09-06 LAB — RENAL FUNCTION PANEL
Albumin: 2.4 g/dL — ABNORMAL LOW (ref 3.5–5.0)
Anion gap: 8 (ref 5–15)
BUN: 52 mg/dL — ABNORMAL HIGH (ref 8–23)
CO2: 29 mmol/L (ref 22–32)
Calcium: 8.6 mg/dL — ABNORMAL LOW (ref 8.9–10.3)
Chloride: 99 mmol/L (ref 98–111)
Creatinine, Ser: 3.9 mg/dL — ABNORMAL HIGH (ref 0.61–1.24)
GFR, Estimated: 15 mL/min — ABNORMAL LOW (ref >60)
Glucose, Bld: 137 mg/dL — ABNORMAL HIGH (ref 70–99)
Phosphorus: 2.4 mg/dL — ABNORMAL LOW (ref 2.5–4.6)
Potassium: 3.2 mmol/L — ABNORMAL LOW (ref 3.5–5.1)
Sodium: 136 mmol/L (ref 135–145)

## 2020-09-06 LAB — CBC
HCT: 23.2 % — ABNORMAL LOW (ref 39.0–52.0)
Hemoglobin: 7.4 g/dL — ABNORMAL LOW (ref 13.0–17.0)
MCH: 30.8 pg (ref 26.0–34.0)
MCHC: 31.9 g/dL (ref 30.0–36.0)
MCV: 96.7 fL (ref 80.0–100.0)
Platelets: 196 10*3/uL (ref 150–400)
RBC: 2.4 MIL/uL — ABNORMAL LOW (ref 4.22–5.81)
RDW: 16 % — ABNORMAL HIGH (ref 11.5–15.5)
WBC: 4.5 10*3/uL (ref 4.0–10.5)
nRBC: 0 % (ref 0.0–0.2)

## 2020-09-06 LAB — IRON AND TIBC
Iron: 51 ug/dL (ref 45–182)
Saturation Ratios: 29 % (ref 17.9–39.5)
TIBC: 178 ug/dL — ABNORMAL LOW (ref 250–450)
UIBC: 127 ug/dL

## 2020-09-06 LAB — GLUCOSE, CAPILLARY
Glucose-Capillary: 246 mg/dL — ABNORMAL HIGH (ref 70–99)
Glucose-Capillary: 203 mg/dL — ABNORMAL HIGH (ref 70–99)
Glucose-Capillary: 225 mg/dL — ABNORMAL HIGH (ref 70–99)

## 2020-09-06 LAB — URINE CULTURE: Culture: 40000 — AB

## 2020-09-06 LAB — FERRITIN: Ferritin: 991 ng/mL — ABNORMAL HIGH (ref 24–336)

## 2020-09-06 MED ORDER — POLYETHYLENE GLYCOL 3350 17 G PO PACK
17.0000 g | PACK | Freq: Two times a day (BID) | ORAL | Status: AC
  Administered 2020-09-06 – 2020-09-07 (×2): 17 g via ORAL
  Filled 2020-09-06 (×2): qty 1

## 2020-09-06 MED ORDER — POTASSIUM CHLORIDE 20 MEQ PO PACK
20.0000 meq | PACK | Freq: Two times a day (BID) | ORAL | Status: AC
  Administered 2020-09-06 (×2): 20 meq via ORAL
  Filled 2020-09-06 (×2): qty 1

## 2020-09-06 NOTE — Progress Notes (Signed)
PROGRESS NOTE    Jeff Rodriguez  LYY:[id number] DOB: 06/03/1945 DOA: 08/11/2020 PCP: [facility]    Chief Complaint  Patient presents with   Cough    Brief Narrative:    75 y.o. male with medical history significant of multiple myeloma (on [name]), diabetes, hypertension, asthma, hyperlipidemia and osteoarthritis who was brought in by EMS secondary to productive cough, poor oral intake and not eating much.  Patient is currently undergoing chemo for his multiple myeloma. Tested positive for COVID 19, found to be hypokalemic and  in acute renal failure.  Nephrology team following.  in view of severe thrombocytopenia, oncology consulted and neupogen given. Bone scan 08/18/2020 negative for lytic lesions.  Due to poor prognosis, palliative care team consulted and patient decided for CRRT.  Per nephrology patient was transferred to [facility] for HD.   He has received 2 units of prbc transfusion so far.  His anemia, thrombocytopenia and renal function have all stabilized, his main issue was poor oral intake and not doing well at home, with addition of Remeron his oral intake has improved, medically now much improved, will require SNF.   Subjective:    Patient still having poor appetite, but reports he is trying to push at least with the Ensure and the fluids.  Reports bowel movement yesterday after few days of constipation.  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   - He has a right IJ HD catheter , for now we will continue to keep the catheter per renal recommendation, need to be discontinued before discharge  . -Unclear if patient will need long-term hemodialysis or not, this is assessed daily by renal depends on his urine output and renal function, discussed with renal, final decision about hemodialysis yet, but recommendation is to keep the right IJ HD catheter for now, creatinine continue to trend up slowly, as well as BUN. -He is with urinary retention Foley catheter inserted 7/15. -His midodrine was increased given soft blood pressure. -Dialysis per renal, plan to resume tomorrow dialysis to see if this improves uremic symptoms including poor appetite, his HD was stopped on 7/15 given soft blood pressure.  Multiple myeloma  - Dr. Ennever/oncology on board. Bone scan does not show any lytic lesions at this time.  Urinary retention -800 cc on bladder scan 7/14, Foley catheter inserted, urine is very cloudy, UA was obtained, showing pyuria, continue with Rocephin, follow on urine cultures.  Anemia of chronic disease  -Given this morning 7.4, most likely due to delusional effect as he received some IV fluids,  Lab Results  Component Value Date   WBC 4.5 09/06/2020   HGB 7.4 (L) 09/06/2020   HCT 23.2 (L) 09/06/2020   MCV 96.7 09/06/2020   PLT 196 09/06/2020    Underlying depression and oral intake being poor for several weeks according to the son.  Not suicidal homicidal.  Placed on Remeron and monitor.    Severe thrombocytopenia - due to multiple myeloma counts much improved.  Acute metabolic encephalopathy  -All mentation has improved, patient is answering questions appropriately ,. Initial CT of the head is negative  for acute intracranial abnormalities.TSH, vitamin B12 normal, ammonia level within normal limits, this problem is almost completely resolved.  COVID 19 virus infection. Completed the course of remdesivir, continue with flutter valve and incentive spirometer and inhalers as needed. Therapy evaluations recommending SNF. He is off isolation.  Hypokalemia and hypomagnesemia.   Both replaced.  Hypertension: Well controlled.   Hyperlipidemia - resume home dose statin once acute issues better.  Severe PCM.  He had NG tube with tube feeds, NG accidentally pulled out on 08/26/2020, monitor on oral protein supplements and oral diet.  Right shoulder blade muscle cramps.  Tramadol and 1 dose of baclofen.   Dysuria 08/31/20 - UA looks dirty, get UC, 1 dose Fosfomycin and monitor.  Type 2 diabetes mellitus -labile sugars due to labile oral intake, monitor on low-dose sliding scale  CBG (last 3)  Recent Labs    09/05/20 1547 09/05/20 2136 09/06/20 1105  GLUCAP 251* 143* 246*      Pressure injury on the back.  Pressure Injury 08/21/20 Coccyx Right;Left;Mid Stage 2 -  Partial thickness loss of dermis presenting as a shallow open injury with a red, pink wound bed without slough. (Active)  08/21/20 2000  Location: Coccyx  Location Orientation: Right;Left;Mid  Staging: Stage 2 -  Partial thickness loss of dermis presenting as a shallow open injury with a red, pink wound bed without slough.  Wound Description (Comments):   Present on Admission:      Pressure Injury 08/24/20 Coccyx Medial Stage 2 -  Partial thickness loss of dermis presenting as a shallow open injury with a red, pink wound bed without slough. pink open (Active)  08/24/20 0910  Location: Coccyx  Location Orientation: Medial  Staging: Stage 2 -  Partial thickness loss of dermis presenting as a shallow open injury with a red, pink wound bed without slough.  Wound Description (Comments): pink open  Present on Admission: No   Wound care   consulted.    Failure to thrive - Dietary will be consulted, PT -OT   In view of multiple comorbidities, clinical deterioration, hemodialysis dependent, AKI, poor progression palliative care consulted for goals of care discussion.    DVT prophylaxis: [place] heparin Code Status: DNR - discussed with patient in detail on 08/28/2020 wants to be DNR, does not want a  feeding tube Family Communication:  D/W son at bedside 7/15 Disposition:   Status is: Inpatient  Remains inpatient appropriate because:Ongoing diagnostic testing needed not appropriate for outpatient work up and Unsafe d/c plan  Dispo: The patient is from: Home              Anticipated d/c is to: SNF              Patient currently is not medically stable to d/c.   Difficult to place patient No   Consultants:  Renal, Oncology, Pall Care  Procedures: none.   Antimicrobials:  Antibiotics Given (last 72 hours)     Date/Time Action Medication Dose Rate   09/04/20 1632 New Bag/Given   cefTRIAXone (ROCEPHIN) 1 g in sodium chloride 0.9 % 100 mL IVPB 1 g 200 mL/hr   09/05/20 1347 New Bag/Given   cefTRIAXone (ROCEPHIN) 1 g in sodium chloride 0.9 % 100 mL IVPB 1 g 200 mL/hr         Objective: Vitals:   09/06/20 0125 09/06/20 0431 09/06/20 0437 09/06/20 1025  BP: 104/63 105/67  (!) 97/53  Pulse: 100 95  98  Resp: 20 20  20  Temp: 98.4 F (36.9 C) 98.4 F (36.9 C)  99 F (37.2 C)  TempSrc: Oral Oral  Oral  SpO2: 99% 100%  100%  Weight:   63.7 kg   Height:        Intake/Output Summary (Last 24 hours) at 09/06/2020 1346 Last data filed at 09/06/2020 1100 Gross per 24 hour  Intake 487 ml  Output 1601 ml  Net -1114 ml   Filed Weights   09/04/20 1520 09/05/20 0354 09/06/20 0437  Weight: (P) 61.7 kg 61.7 kg 63.7 kg    Examination:  Awake Alert, Oriented X 3, it is frail, deconditioned, with muscle wasting, right IJ HD cathete Symmetrical Chest wall movement, Good air movement bilaterally, CTAB RRR,No Gallops,Rubs or new Murmurs, No Parasternal Heave +ve B.Sounds, Abd Soft, No tenderness, No rebound - guarding or rigidity. No Cyanosis, Clubbing or edema, No new Rash or bruise     Data Reviewed: I have personally reviewed following labs and imaging studies  CBC: Recent Labs  Lab 08/31/20 0050 09/01/20 0139 09/02/20 0816 09/03/20 0054 09/04/20 1405 09/06/20 0314   WBC 3.9* 3.8* 5.3 4.7 3.7* 4.5  NEUTROABS 2.3 2.0 3.0 2.7  --   --   HGB 8.1* 8.9* 8.5* 8.5* 8.1* 7.4*  HCT 24.2* 26.7* 26.0* 25.9* 25.1* 23.2*  MCV 94.5 94.0 94.5 93.5 95.8 96.7  PLT 109* 121* 174 189 195 160    Basic Metabolic Panel: Recent Labs  Lab 08/31/20 0050 09/01/20 0139 09/02/20 0412 09/03/20 0054 09/04/20 0058 09/05/20 0116 09/06/20 0314  NA 133* 133* 132* 133* 132* 133* 136  K 3.3* 3.8 3.7 3.8 3.6 3.2* 3.2*  CL 102 101 100 99 96* 99 99  CO2 21* 21* _0 GLUCOSE 127* 126* 129* 152* 130* 122* 137*  BUN 58* 58* 56* 60* 63* 49* 52*  CREATININE 4.38* 4.39* 4.48* 4.67* 4.72* 3.84* 3.90*  CALCIUM 8.0* 8.1* 8.5* 8.6* 9.0 8.4* 8.6*  MG 1.7 1.8 1.7 1.9  --   --   --   PHOS  --   --   --   --  4.2 2.7 2.4*    GFR: Estimated Creatinine Clearance: 15 mL/min (A) (by C-G formula based on SCr of 3.9 mg/dL (H)).  Liver Function Tests: Recent Labs  Lab 08/31/20 0050 09/01/20 0139 09/02/20 0412 09/03/20 0054 09/04/20 0058 09/05/20 0116 09/06/20 0314  AST _1 --   --   --   ALT 26 28 35 34  --   --   --   ALKPHOS 45 41 57 62  --   --   --   BILITOT 0.7 0.5 0.5 0.3  --   --   --   PROT 6.7 6.3* 6.9 7.3  --   --   --   ALBUMIN 2.2* 2.1* 2.3* 2.3* 2.4* 2.5* 2.4*    CBG: Recent Labs  Lab 09/05/20 0757 09/05/20 1232 09/05/20 1547 09/05/20 2136 09/06/20 1105  GLUCAP 136* 361* 251* 143* 246*     Recent Results (from the past 240 hour(s))  Culture, Urine     Status: Abnormal   Collection Time: 09/01/20 10:55 AM   Specimen: Urine, Clean Catch  Result Value Ref Range Status   Specimen Description URINE, CLEAN CATCH  Final   Special Requests   Final    NONE Performed at [facility], [place] [address], [place], [place] [address]    Culture >=100,000 COLONIES/mL ENTEROBACTER AEROGENES (A)  Final   Report Status 09/03/2020 FINAL  Final   Organism ID, Bacteria ENTEROBACTER AEROGENES (A)  Final      Susceptibility   Enterobacter aerogenes  - MIC*    CEFAZOLIN >=64 RESISTANT Resistant     CEFEPIME <=0.12 SENSITIVE Sensitive     CEFTRIAXONE <=0.25 SENSITIVE Sensitive     CIPROFLOXACIN <=0.25 SENSITIVE Sensitive     GENTAMICIN <=1 SENSITIVE Sensitive     IMIPENEM 2 SENSITIVE Sensitive     NITROFURANTOIN 64 INTERMEDIATE Intermediate     TRIMETH/SULFA <=20 SENSITIVE Sensitive     PIP/TAZO <=4 SENSITIVE Sensitive     * >=100,000 COLONIES/mL ENTEROBACTER AEROGENES  Urine Culture     Status: Abnormal   Collection Time: 09/04/20 11:58 AM   Specimen: Urine, Catheterized  Result Value Ref Range Status   Specimen Description URINE, CATHETERIZED  Final   Special Requests   Final    NONE Performed at [facility], [place] [address], [place], [place] [address]    Culture 40,000 COLONIES/mL ENTEROBACTER AEROGENES (A)  Final   Report Status 09/06/2020 FINAL  Final   Organism ID, Bacteria ENTEROBACTER AEROGENES (A)  Final      Susceptibility   Enterobacter aerogenes - MIC*    CEFAZOLIN RESISTANT Resistant     CEFEPIME <=0.12 SENSITIVE Sensitive     CEFTRIAXONE <=0.25 SENSITIVE Sensitive     CIPROFLOXACIN <=0.25 SENSITIVE Sensitive     GENTAMICIN <=1 SENSITIVE Sensitive     IMIPENEM 1 SENSITIVE Sensitive     NITROFURANTOIN 64 INTERMEDIATE Intermediate     TRIMETH/SULFA <=20 SENSITIVE Sensitive     PIP/TAZO <=4 SENSITIVE Sensitive     * 40,000 COLONIES/mL ENTEROBACTER AEROGENES      Radiology Studies: No results found.   Scheduled Meds:  calcium carbonate  1,000 mg of elemental calcium Per Tube BID WC   chlorhexidine  15 mL Mouth Rinse BID   Chlorhexidine Gluconate Cloth  6 each Topical Q0600   Chlorhexidine Gluconate Cloth  6 each Topical Q0600   darbepoetin (ARANESP) injection - DIALYSIS  60 mcg Subcutaneous Q Mon-HD   feeding supplement  237 mL Oral TID BM   finasteride  5 mg Oral Daily   heparin injection (subcutaneous)  5,000 Units Subcutaneous Q8H   insulin aspart  0-5 Units Subcutaneous QHS   insulin aspart   0-9 Units Subcutaneous TID WC   lidocaine  1 patch Transdermal Q24H   magic mouthwash  10 mL Oral TID   mouth rinse  15 mL Mouth Rinse q12n4p   midodrine  10 mg Oral BID WC   mirtazapine  15 mg Oral QHS   multivitamin with minerals  1 tablet Oral Daily   polyethylene glycol  17 g Oral BID   potassium chloride  20 mEq Oral BID   senna-docusate  1 tablet Oral BID   tamsulosin  0.4 mg Oral QHS   Continuous Infusions:  sodium chloride Stopped (08/22/20 1544)   cefTRIAXone (ROCEPHIN)  IV 1 g (09/05/20 1347)     LOS: 26 days   Signature  Phillips Climes M.D on 09/06/2020 at 1:46 PM   -  To page go to [email or web address]

## 2020-09-06 NOTE — Progress Notes (Signed)
Subjective:   UOP 1.6L yesterday, ins not documented. Cr 3.8 > 3.9, BUN 49 > 52.  He continues to report poor po intake that is new this hospitalization - poss dysgeusia.  No other complaints.    Objective Vital signs in last 24 hours: Vitals:   09/06/20 0125 09/06/20 0431 09/06/20 0437 09/06/20 1025  BP: 104/63 105/67  (!) 97/53  Pulse: 100 95  98  Resp: _0 Temp: 98.4 F (36.9 C) 98.4 F (36.9 C)  99 F (37.2 C)  TempSrc: Oral Oral  Oral  SpO2: 99% 100%  100%  Weight:   63.7 kg   Height:       Weight change: 2 kg  Intake/Output Summary (Last 24 hours) at 09/06/2020 1200 Last data filed at 09/06/2020 1100 Gross per 24 hour  Intake 487 ml  Output 1601 ml  Net -1114 ml    Physical Exam: Gen: nad, awake and alert HEENT: anicteric sclera, MMM Chest: s1s2, rrr Resp: cta bl Abd: soft, nt/nd Msk; no edema Neuro: no tremors/asterixis HD access: RIJ temp HD trialysis c/d/i   Labs: Basic Metabolic Panel: Recent Labs  Lab 09/04/20 0058 09/05/20 0116 09/06/20 0314  NA 132* 133* 136  K 3.6 3.2* 3.2*  CL 96* 99 99  CO2 _1 GLUCOSE 130* 122* 137*  BUN 63* 49* 52*  CREATININE 4.72* 3.84* 3.90*  CALCIUM 9.0 8.4* 8.6*  PHOS 4.2 2.7 2.4*    Liver Function Tests: Recent Labs  Lab 09/01/20 0139 09/02/20 0412 09/03/20 0054 09/04/20 0058 09/05/20 0116 09/06/20 0314  AST _2 --   --   --   ALT 28 35 34  --   --   --   ALKPHOS 41 57 62  --   --   --   BILITOT 0.5 0.5 0.3  --   --   --   PROT 6.3* 6.9 7.3  --   --   --   ALBUMIN 2.1* 2.3* 2.3* 2.4* 2.5* 2.4*    No results for input(s): LIPASE, AMYLASE in the last 168 hours. No results for input(s): AMMONIA in the last 168 hours.  CBC: Recent Labs  Lab 09/01/20 0139 09/02/20 0816 09/03/20 0054 09/04/20 1405 09/06/20 0314  WBC 3.8* 5.3 4.7 3.7* 4.5  NEUTROABS 2.0 3.0 2.7  --   --   HGB 8.9* 8.5* 8.5* 8.1* 7.4*  HCT 26.7* 26.0* 25.9* 25.1* 23.2*  MCV 94.0 94.5 93.5 95.8 96.7  PLT 121*  174 189 195 196    Cardiac Enzymes: No results for input(s): CKTOTAL, CKMB, CKMBINDEX, TROPONINI in the last 168 hours. CBG: Recent Labs  Lab 09/05/20 0757 09/05/20 1232 09/05/20 1547 09/05/20 2136 09/06/20 1105  GLUCAP 136* 361* 251* 143* 246*     Iron Studies:  No results for input(s): IRON, TIBC, TRANSFERRIN, FERRITIN in the last 72 hours.  Studies/Results: No results found. Medications: Infusions:  sodium chloride Stopped (08/22/20 1544)   cefTRIAXone (ROCEPHIN)  IV 1 g (09/05/20 1347)    Scheduled Medications:  calcium carbonate  1,000 mg of elemental calcium Per Tube BID WC   chlorhexidine  15 mL Mouth Rinse BID   Chlorhexidine Gluconate Cloth  6 each Topical Q0600   Chlorhexidine Gluconate Cloth  6 each Topical Q0600   darbepoetin (ARANESP) injection - DIALYSIS  60 mcg Subcutaneous Q Mon-HD   feeding supplement  237 mL Oral TID BM   finasteride  5 mg Oral Daily  heparin injection (subcutaneous)  5,000 Units Subcutaneous Q8H   insulin aspart  0-5 Units Subcutaneous QHS   insulin aspart  0-9 Units Subcutaneous TID WC   lidocaine  1 patch Transdermal Q24H   magic mouthwash  10 mL Oral TID   mouth rinse  15 mL Mouth Rinse q12n4p   midodrine  10 mg Oral BID WC   mirtazapine  15 mg Oral QHS   multivitamin with minerals  1 tablet Oral Daily   polyethylene glycol  17 g Oral BID   potassium chloride  20 mEq Oral BID   senna-docusate  1 tablet Oral BID   sodium bicarbonate  650 mg Oral BID   tamsulosin  0.4 mg Oral QHS    have reviewed scheduled and prn medications.      Assessment/ Plan: Pt is a 75 y.o. yo male with multiple myeloma who was admitted on 08/11/2020 with COVID-  A on CRT-  crt 1.7 in Jan 2022-  now 7- non oliguric   1. A on CRF-  baseline creat 1.7 in Jan 2022, presented with Cr 7.3. Renal u/s neg for obstruction. UA unrevealing.  Suspect ATN from hypotension/ ACEi.  Myeloma kidney unlikely- per ONC w/u current myeloma testing is negative.  Initially pt was refusing dialysis, but then changed his mind. Due to decline in responsiveness and persistent azotemia (creat in 7's), pt was started on CRRT 6/29. Pt became more alert and responsive w/ CRRT. CRRT dc'd on 7/02 when pt was transferred to [facility] -slowly worsening renal function over past few days, perhaps some low grade uremic symptoms and we tried HD 6/19 which was complicated by hypotension - rec'd only 69mn of low efficiency HD -7/16 AM labs remarkably improved after 1L NS and midodrine (along with foley that was placed PM of 7/14 for retention 805m -Pushing oral fluids - struggling and labs worsening but doesn't feel like eating/drinking -suspect low grade uremic symptoms so will plan for dialysis tomorrow - goal is for appetite to improve with dialysis -We had several long discussion re: dialysis today this week  He did home hemo for his wife for 5 years until she passed a few years ago.  Should his renal function continue to worsen he may become dialysis dependent.  He's become more frail in the past few years, months and during this admission.  He isn't ambulatory currently and can transfer only with assistance.  I think dialysis would be challenging and wouldn't give a lot of quality of life but asked him to consider if he would want to try vs do hospice should he become dialysis dependent.  He would plan to do dialysis at this point.  2. HTN/volume-  Improved BP after fluid bolus.  Push po fluids. On midodrine for now.  3. Anemia-  blood transfusion 6/23, Hb in 8s > 7s, ordered darbe 60 ug weekly IV started 7/4.  Check iron stores. 4. Hypocalcemia-    no sxms -   got bolus during hosp. Monitor albumin or ionized cal, replete prn 5. Hx multiple myeloma - no evidence here of active disease, see ONC notes 6. Pancytopenia - per Hem-Onc 7.  COVID 19 infection - sp course of remdesivir   Dispo - not ready for d/c until renal function stable or committed to HD   Jeff Rodriguez [place] [phone]

## 2020-09-07 DIAGNOSIS — R627 Adult failure to thrive: Secondary | ICD-10-CM | POA: Diagnosis not present

## 2020-09-07 DIAGNOSIS — N179 Acute kidney failure, unspecified: Secondary | ICD-10-CM | POA: Diagnosis not present

## 2020-09-07 LAB — CBC
HCT: 22.6 % — ABNORMAL LOW (ref 39.0–52.0)
Hemoglobin: 7.4 g/dL — ABNORMAL LOW (ref 13.0–17.0)
MCH: 31.5 pg (ref 26.0–34.0)
MCHC: 32.7 g/dL (ref 30.0–36.0)
MCV: 96.2 fL (ref 80.0–100.0)
Platelets: 194 10*3/uL (ref 150–400)
RBC: 2.35 MIL/uL — ABNORMAL LOW (ref 4.22–5.81)
RDW: 16.1 % — ABNORMAL HIGH (ref 11.5–15.5)
WBC: 5.5 10*3/uL (ref 4.0–10.5)
nRBC: 0 % (ref 0.0–0.2)

## 2020-09-07 LAB — RENAL FUNCTION PANEL
Albumin: 2.3 g/dL — ABNORMAL LOW (ref 3.5–5.0)
Anion gap: 7 (ref 5–15)
BUN: 53 mg/dL — ABNORMAL HIGH (ref 8–23)
CO2: 29 mmol/L (ref 22–32)
Calcium: 8.5 mg/dL — ABNORMAL LOW (ref 8.9–10.3)
Chloride: 97 mmol/L — ABNORMAL LOW (ref 98–111)
Creatinine, Ser: 3.73 mg/dL — ABNORMAL HIGH (ref 0.61–1.24)
GFR, Estimated: 16 mL/min — ABNORMAL LOW (ref >60)
Glucose, Bld: 222 mg/dL — ABNORMAL HIGH (ref 70–99)
Phosphorus: 2.1 mg/dL — ABNORMAL LOW (ref 2.5–4.6)
Potassium: 3.7 mmol/L (ref 3.5–5.1)
Sodium: 133 mmol/L — ABNORMAL LOW (ref 135–145)

## 2020-09-07 LAB — GLUCOSE, CAPILLARY
Glucose-Capillary: 127 mg/dL — ABNORMAL HIGH (ref 70–99)
Glucose-Capillary: 265 mg/dL — ABNORMAL HIGH (ref 70–99)

## 2020-09-07 MED ORDER — DARBEPOETIN ALFA 100 MCG/0.5ML IJ SOSY: 100.0000 ug | PREFILLED_SYRINGE | INTRAMUSCULAR | Status: DC

## 2020-09-07 NOTE — Progress Notes (Signed)
Subjective:   UOP 1.6L yesterday,  Cr 3.8 > 3.9>3.7, BUN 49 > 52>53.  He says he has been getting out of bed and denies nausea-  plan was going to be for HD today but I dont think he needs    Objective Vital signs in last 24 hours: Vitals:   09/07/20 0227 09/07/20 0500 09/07/20 0738 09/07/20 1144  BP: 119/71  120/62 (!) 121/58  Pulse: 87  85 89  Resp: _0 Temp: 98 F (36.7 C)  97.8 F (36.6 C) (!) 97.5 F (36.4 C)  TempSrc: Oral  Oral Oral  SpO2: 100%  100% 100%  Weight:  62.3 kg    Height:       Weight change: -1.4 kg  Intake/Output Summary (Last 24 hours) at 09/07/2020 1500 Last data filed at 09/07/2020 0854 Gross per 24 hour  Intake 100 ml  Output 2300 ml  Net -2200 ml   Physical Exam: Gen: nad, awake and alert HEENT: anicteric sclera, MMM Chest: s1s2, rrr Resp: cta bl Abd: soft, nt/nd Msk; no edema Neuro: no tremors/asterixis HD access: RIJ temp HD trialysis c/d/i   Labs: Basic Metabolic Panel: Recent Labs  Lab 09/05/20 0116 09/06/20 0314 09/07/20 0335  NA 133* 136 133*  K 3.2* 3.2* 3.7  CL 99 99 97*  CO2 _1 GLUCOSE 122* 137* 222*  BUN 49* 52* 53*  CREATININE 3.84* 3.90* 3.73*  CALCIUM 8.4* 8.6* 8.5*  PHOS 2.7 2.4* 2.1*   Liver Function Tests: Recent Labs  Lab 09/01/20 0139 09/02/20 0412 09/03/20 0054 09/04/20 0058 09/05/20 0116 09/06/20 0314 09/07/20 0335  AST _2 --   --   --   --   ALT 28 35 34  --   --   --   --   ALKPHOS 41 57 62  --   --   --   --   BILITOT 0.5 0.5 0.3  --   --   --   --   PROT 6.3* 6.9 7.3  --   --   --   --   ALBUMIN 2.1* 2.3* 2.3*   < > 2.5* 2.4* 2.3*   < > = values in this interval not displayed.   No results for input(s): LIPASE, AMYLASE in the last 168 hours. No results for input(s): AMMONIA in the last 168 hours.  CBC: Recent Labs  Lab 09/01/20 0139 09/02/20 0816 09/03/20 0054 09/04/20 1405 09/06/20 0314 09/07/20 0335  WBC 3.8* 5.3 4.7 3.7* 4.5 5.5  NEUTROABS 2.0 3.0 2.7  --    --   --   HGB 8.9* 8.5* 8.5* 8.1* 7.4* 7.4*  HCT 26.7* 26.0* 25.9* 25.1* 23.2* 22.6*  MCV 94.0 94.5 93.5 95.8 96.7 96.2  PLT 121* 174 189 195 196 194   Cardiac Enzymes: No results for input(s): CKTOTAL, CKMB, CKMBINDEX, TROPONINI in the last 168 hours. CBG: Recent Labs  Lab 09/05/20 2136 09/06/20 1105 09/06/20 1801 09/06/20 2107 09/07/20 0736  GLUCAP 143* 246* 203* 225* 127*    Iron Studies:  Recent Labs    09/06/20 1620  IRON 51  TIBC 178*  FERRITIN 991*    Studies/Results: No results found. Medications: Infusions:  sodium chloride Stopped (08/22/20 1544)   cefTRIAXone (ROCEPHIN)  IV 1 g (09/06/20 1501)    Scheduled Medications:  calcium carbonate  1,000 mg of elemental calcium Per Tube BID WC   chlorhexidine  15 mL Mouth Rinse BID   Chlorhexidine  Gluconate Cloth  6 each Topical Q0600   Chlorhexidine Gluconate Cloth  6 each Topical Q0600   darbepoetin (ARANESP) injection - DIALYSIS  60 mcg Subcutaneous Q Mon-HD   feeding supplement  237 mL Oral TID BM   finasteride  5 mg Oral Daily   heparin injection (subcutaneous)  5,000 Units Subcutaneous Q8H   insulin aspart  0-5 Units Subcutaneous QHS   insulin aspart  0-9 Units Subcutaneous TID WC   lidocaine  1 patch Transdermal Q24H   magic mouthwash  10 mL Oral TID   mouth rinse  15 mL Mouth Rinse q12n4p   midodrine  10 mg Oral BID WC   mirtazapine  15 mg Oral QHS   multivitamin with minerals  1 tablet Oral Daily   senna-docusate  1 tablet Oral BID   tamsulosin  0.4 mg Oral QHS    have reviewed scheduled and prn medications.      Assessment/ Plan: Pt is a 75 y.o. yo male with multiple myeloma who was admitted on 08/11/2020 with COVID-  A on CRF-  crt 1.7 in Jan 2022-  then 7- non oliguric   1. A on CRF-  baseline creat 1.7 in Jan 2022, presented with Cr 7.3. Renal u/s neg for obstruction. UA unrevealing.  Suspected ATN from hypotension/ ACEi.  Myeloma kidney unlikely- per ONC w/u current myeloma testing is  negative. Initially pt was refusing dialysis, but then changed his mind. Due to decline in responsiveness and persistent azotemia (creat in 7's), pt was started on CRRT 6/29. Pt became more alert and responsive w/ CRRT. CRRT dc'd on 7/02 when pt was transferred to [facility]. -slowly worsening renal function , some low grade uremic symptoms and we tried HD 6/76 which was complicated by hypotension - rec'd only 31mn of low efficiency HD -7/16 AM labs remarkably improved after 1L NS and midodrine (along with foley that was placed PM of 7/14 for retention 8059m -Pushing oral fluids - struggling and labs worsening but doesn't feel like eating/drinking -We had several long discussion re: dialysis today this week  He did home hemo for his wife for 5 years until she passed a few years ago.  Should his renal function continue to worsen he may become dialysis dependent.  He's become more frail in the past few years, months and during this admission.  He isn't ambulatory currently and can transfer only with assistance.  I think dialysis would be challenging and wouldn't give a lot of quality of life but asked him to consider if he would want to try vs do hospice should he become dialysis dependent.  He would plan to do dialysis at this point.   His renal function is stable and better in the 3's-  I really cannot make a compelling argument to do HD today, he thinks he is improving slowly overall during this hosp-  his HD cath has been in for 18 days !  Will need to get it out 2. HTN/volume-  Improved BP after fluid bolus.  Push po fluids. On midodrine for now.  3. Anemia-  blood transfusion 6/23, Hb in 8s > 7s, ordered darbe 60 ug weekly IV started 7/4.  iron stores OK. Will increase darbe 4. Hypocalcemia-    no sxms -   got bolus during hosp. Monitor albumin or ionized cal, replete prn 5. Hx multiple myeloma - no evidence here of active disease, see ONC notes 6. Pancytopenia - per Hem-Onc 7.  COVID 19 infection - sp  course of remdesivir   Dispo - not ready for d/c until renal function stable or committed to HD -  I am leaning more toward him not needing HD-  essentially has not had since 7/2 and is holding his own  [place]

## 2020-09-07 NOTE — Progress Notes (Signed)
Occupational Therapy Treatment Patient Details Name: Jeff Rodriguez MRN: [id number] DOB: [birth year] Today's Date: 09/07/2020    History of present illness Patient is a 75 year old male who was admitted to the hospital on 6/22 with COVID 19 and AKI; at [facility], had initiated Renal Replacement Therapy, transferred to [facility];  past medical history significant for multiple myeloma, diabetes mellitus, hypertension, asthma, hyperlipidemia, osteoarthritis.   OT comments  Pt assisted to sit EOB. Self fed with set up to open containers, groomed with supervision and changed gown due to spillage with min assist. Pt continues to demonstrate slow processing and decreased problem solving, but improved attention. Declined OOB anticipating HD soon.   Follow Up Recommendations  SNF    Equipment Recommendations  Tub/shower seat;3 in 1 bedside commode    Recommendations for Other Services      Precautions / Restrictions Precautions Precautions: Fall       Mobility Bed Mobility Overal bed mobility: Needs Assistance Bed Mobility: Supine to Sit;Sit to Supine     Supine to sit: Mod assist;HOB elevated Sit to supine: Mod assist   General bed mobility comments: assist to raise trunk and for LEs back into bed, pt declined OOB to chair anticipating going to HD soon    Transfers                      Balance Overall balance assessment: Needs assistance Sitting-balance support: No upper extremity supported Sitting balance-Leahy Scale: Fair Sitting balance - Comments: supervision while engaged in eating and grooming                                   ADL either performed or assessed with clinical judgement   ADL Overall ADL's : Needs assistance/impaired Eating/Feeding: Set up;Sitting;Supervision/ safety Eating/Feeding Details (indicate cue type and reason): at EOB, assisted to open containers Grooming: Wash/dry hands;Wash/dry face;Sitting;Supervision/safety           Upper  Body Dressing : Minimal assistance;Sitting Upper Body Dressing Details (indicate cue type and reason): to change soiled gown                         Vision       Perception     Praxis      Cognition Arousal/Alertness: Awake/alert Behavior During Therapy: WFL for tasks assessed/performed Overall Cognitive Status: Impaired/Different from baseline Area of Impairment: Memory;Safety/judgement;Following commands;Awareness;Problem solving                     Memory: Decreased short-term memory Following Commands: Follows one step commands with increased time;Follows multi-step commands inconsistently Safety/Judgement: Decreased awareness of safety;Decreased awareness of deficits   Problem Solving: Slow processing;Decreased initiation;Difficulty sequencing;Requires verbal cues General Comments: pt served eggs for breakfast and has since admission, but failed to tell anyone.        Exercises     Shoulder Instructions       General Comments      Pertinent Vitals/ Pain       Pain Assessment: No/denies pain  Home Living                                          Prior Functioning/Environment  Frequency  Min 2X/week        Progress Toward Goals  OT Goals(current goals can now be found in the care plan section)  Progress towards OT goals: Progressing toward goals  Acute Rehab OT Goals Patient Stated Goal: Go home OT Goal Formulation: With patient/family Time For Goal Achievement: 09/11/20 Potential to Achieve Goals: [place] Discharge plan remains appropriate    Co-evaluation                 AM-PAC OT "6 Clicks" Daily Activity     Outcome Measure   Help from another person eating meals?: A Little Help from another person taking care of personal grooming?: A Little Help from another person toileting, which includes using toliet, bedpan, or urinal?: A Lot Help from another person bathing (including  washing, rinsing, drying)?: A Lot Help from another person to put on and taking off regular upper body clothing?: A Little Help from another person to put on and taking off regular lower body clothing?: A Lot 6 Click Score: 15    End of Session    OT Visit Diagnosis: Muscle weakness (generalized) (M62.81);Pain   Activity Tolerance Patient tolerated treatment well   Patient Left in bed;with call bell/phone within reach;with bed alarm set   Nurse Communication          Time: [phone] OT Time Calculation (min): 14 min  Charges: OT General Charges $OT Visit: 1 Visit OT Treatments $Self Care/Home Management : 8-22 mins  Nestor Lewandowsky, OTR/L Acute Rehabilitation Services Pager: [phone] Office: [phone]   Malka So 09/07/2020, 12:07 PM

## 2020-09-07 NOTE — Progress Notes (Signed)
PROGRESS NOTE    Jeff Rodriguez  BWL:[id number] DOB: 09-24-1945 DOA: 08/11/2020 PCP: [facility]    Chief Complaint  Patient presents with   Cough    Brief Narrative:    75 y.o. male with medical history significant of multiple myeloma (on [name]), diabetes, hypertension, asthma, hyperlipidemia and osteoarthritis who was brought in by EMS secondary to productive cough, poor oral intake and not eating much.  Patient is currently undergoing chemo for his multiple myeloma. Tested positive for COVID 19, found to be hypokalemic and  in acute renal failure.  Nephrology team following.  in view of severe thrombocytopenia, oncology consulted and neupogen given. Bone scan 08/18/2020 negative for lytic lesions.  Due to poor prognosis, palliative care team consulted and patient decided for CRRT.  Per nephrology patient was transferred to [facility] for HD.   He has received 2 units of prbc transfusion so far.  His anemia, thrombocytopenia and renal function have all stabilized, his main issue was poor oral intake and not doing well at home, with addition of Remeron his oral intake has improved, medically now much improved, will require SNF.   Subjective:    Patient still having poor appetite, but reports he is trying to push at least with the Ensure and the fluids.  Reports bowel movement yesterday after few days of constipation.  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   - He has a right IJ HD catheter , for now we will continue to keep the catheter per renal recommendation, need to be discontinued before discharge  . -Unclear if patient will need long-term hemodialysis or not, this is assessed daily by renal depends on his urine output and renal function, discussed with renal, final decision about hemodialysis yet, but recommendation is to keep the right IJ HD catheter for now, creatinine continue to trend up slowly, as well as BUN. -He is with urinary retention Foley catheter inserted 7/15. -His midodrine was increased given soft blood pressure. -Dialysis per renal, plan plan for hemodialysis today to see if this improves uremic symptoms including poor appetite, his HD was stopped on 7/15 given soft blood pressure.  Multiple myeloma  - Dr. Ennever/oncology on board. Bone scan does not show any lytic lesions at this time.  Urinary retention -800 cc on bladder scan 7/14, Foley catheter inserted, urine is very cloudy, UA was obtained, showing pyuria, continue with Rocephin, urine culture growing Enterobacter aerogenes  Anemia of chronic disease  -Hemoglobin at 7.4 today, continue to monitor closely and transfuse for Hgb <7.  Lab Results  Component Value Date   WBC 5.5 09/07/2020   HGB 7.4 (L) 09/07/2020   HCT 22.6 (L) 09/07/2020   MCV 96.2 09/07/2020   PLT 194 09/07/2020    Underlying depression and oral intake being poor for several weeks according to the son.  Not suicidal homicidal.  Placed on Remeron and monitor.    Severe thrombocytopenia - due to multiple myeloma counts much improved.  Acute metabolic encephalopathy  -All mentation has improved, patient is answering questions appropriately ,. Initial CT of the head is negative for acute  intracranial abnormalities.TSH, vitamin B12 normal, ammonia level within normal limits, this problem is almost completely resolved.  COVID 19 virus infection. Completed the course of remdesivir, continue with flutter valve and incentive spirometer and inhalers as needed. Therapy evaluations recommending SNF. He is off isolation.  Hypokalemia and  hypomagnesemia.  Both replaced.  Hypertension: Well controlled.   Hyperlipidemia - resume home dose statin once acute issues better.  Severe PCM.  He had NG tube with tube feeds, NG accidentally pulled out on 08/26/2020, monitor on oral protein supplements and oral diet.  Appetite remains poor, will see if HD will help  Right shoulder blade muscle cramps.  Tramadol and 1 dose of baclofen.   Type 2 diabetes mellitus -labile sugars due to labile oral intake, monitor on low-dose sliding scale  CBG (last 3)  Recent Labs    09/06/20 1801 09/06/20 2107 09/07/20 0736  GLUCAP 203* 225* 127*      Pressure injury on the back.  Pressure Injury 08/21/20 Coccyx Right;Left;Mid Stage 2 -  Partial thickness loss of dermis presenting as a shallow open injury with a red, pink wound bed without slough. (Active)  08/21/20 2000  Location: Coccyx  Location Orientation: Right;Left;Mid  Staging: Stage 2 -  Partial thickness loss of dermis presenting as a shallow open injury with a red, pink wound bed without slough.  Wound Description (Comments):   Present on Admission:      Pressure Injury 08/24/20 Coccyx Medial Stage 2 -  Partial thickness loss of dermis presenting as a shallow open injury with a red, pink wound bed without slough. pink open (Active)  08/24/20 0910  Location: Coccyx  Location Orientation: Medial  Staging: Stage 2 -  Partial thickness loss of dermis presenting as a shallow open injury with a red, pink wound bed without slough.  Wound Description (Comments): pink open  Present on Admission: No   Wound care   consulted.    Failure to thrive - Dietary will be consulted, PT -OT   In view of multiple comorbidities, clinical deterioration, hemodialysis dependent, AKI, poor progression palliative care consulted for goals of care discussion.    DVT prophylaxis: [place] heparin Code Status: DNR - discussed with patient in detail on 08/28/2020 wants to be DNR, does not want a feeding  tube Family Communication:  D/W son at bedside 7/15 Disposition:   Status is: Inpatient  Remains inpatient appropriate because:Ongoing diagnostic testing needed not appropriate for outpatient work up and Unsafe d/c plan  Dispo: The patient is from: Home              Anticipated d/c is to: SNF              Patient currently is not medically stable to d/c.   Difficult to place patient No   Consultants:  Renal, Oncology, Pall Care  Procedures: none.   Antimicrobials:  Antibiotics Given (last 72 hours)     Date/Time Action Medication Dose Rate   09/04/20 1632 New Bag/Given   cefTRIAXone (ROCEPHIN) 1 g in sodium chloride 0.9 % 100 mL IVPB 1 g 200 mL/hr   09/05/20 1347 New Bag/Given   cefTRIAXone (ROCEPHIN) 1 g in sodium chloride 0.9 % 100 mL IVPB 1 g 200 mL/hr   09/06/20 1501 New Bag/Given   cefTRIAXone (ROCEPHIN) 1 g in sodium chloride 0.9 % 100 mL IVPB 1 g 200 mL/hr         Objective: Vitals:   09/07/20 0227 09/07/20 0500 09/07/20 0738 09/07/20 1144  BP: 119/71  120/62 (!) 121/58  Pulse: 87  85 89  Resp: $Remo'16  17 16  'sqbbp$ Temp: 98 F (36.7 C)  97.8 F (36.6 C) (!) 97.5 F (36.4 C)  TempSrc: Oral  Oral Oral  SpO2: 100%  100% 100%  Weight:  62.3 kg    Height:        Intake/Output Summary (Last 24 hours) at 09/07/2020 1304 Last data filed at 09/07/2020 0854 Gross per 24 hour  Intake 100 ml  Output 2300 ml  Net -2200 ml   Filed Weights   09/05/20 0354 09/06/20 0437 09/07/20 0500  Weight: 61.7 kg 63.7 kg 62.3 kg    Examination:  Awake Alert, Oriented X 3, Trembly frail, deconditioned and thin appearing, right IJ TDC  Good air entry bilaterally, no wheezing. RRR,No Gallops,Rubs or new Murmurs, No Parasternal Heave +ve B.Sounds, Abd Soft, No tenderness, No rebound - guarding or rigidity. No Cyanosis, Clubbing or edema, No new Rash or bruise       Data Reviewed: I have personally reviewed following labs and imaging studies  CBC: Recent Labs  Lab 09/01/20 0139  09/02/20 0816 09/03/20 0054 09/04/20 1405 09/06/20 0314 09/07/20 0335  WBC 3.8* 5.3 4.7 3.7* 4.5 5.5  NEUTROABS 2.0 3.0 2.7  --   --   --   HGB 8.9* 8.5* 8.5* 8.1* 7.4* 7.4*  HCT 26.7* 26.0* 25.9* 25.1* 23.2* 22.6*  MCV 94.0 94.5 93.5 95.8 96.7 96.2  PLT 121* 174 189 195 196 696    Basic Metabolic Panel: Recent Labs  Lab 09/01/20 0139 09/02/20 0412 09/03/20 0054 09/04/20 0058 09/05/20 0116 09/06/20 0314 09/07/20 0335  NA 133* 132* 133* 132* 133* 136 133*  K 3.8 3.7 3.8 3.6 3.2* 3.2* 3.7  CL 101 100 99 96* 99 99 97*  CO2 21* $Remov'23 25 25 26 29 29  'EjLCjb$ GLUCOSE 126* 129* 152* 130* 122* 137* 222*  BUN 58* 56* 60* 63* 49* 52* 53*  CREATININE 4.39* 4.48* 4.67* 4.72* 3.84* 3.90* 3.73*  CALCIUM 8.1* 8.5* 8.6* 9.0 8.4* 8.6* 8.5*  MG 1.8 1.7 1.9  --   --   --   --   PHOS  --   --   --  4.2 2.7 2.4* 2.1*    GFR: Estimated Creatinine Clearance: 15.3 mL/min (A) (by C-G formula based on SCr of 3.73 mg/dL (H)).  Liver Function Tests: Recent Labs  Lab 09/01/20 0139 09/02/20 0412 09/03/20 0054 09/04/20 0058 09/05/20 0116 09/06/20 0314 09/07/20 0335  AST $Re'27 31 23  'Uxc$ --   --   --   --   ALT 28 35 34  --   --   --   --   ALKPHOS 41 57 62  --   --   --   --   BILITOT 0.5 0.5 0.3  --   --   --   --   PROT 6.3* 6.9 7.3  --   --   --   --   ALBUMIN 2.1* 2.3* 2.3* 2.4* 2.5* 2.4* 2.3*    CBG: Recent Labs  Lab 09/05/20 2136 09/06/20 1105 09/06/20 1801 09/06/20 2107 09/07/20 0736  GLUCAP 143* 246* 203* 225* 127*     Recent Results (from the past 240 hour(s))  Culture, Urine     Status: Abnormal   Collection Time: 09/01/20 10:55 AM   Specimen: Urine, Clean Catch  Result Value Ref Range Status   Specimen Description URINE, CLEAN CATCH  Final   Special Requests  Final    NONE Performed at [facility], [place] [address] [place], [place] [address]    Culture >=100,000 COLONIES/mL ENTEROBACTER AEROGENES (A)  Final   Report Status 09/03/2020 FINAL  Final   Organism ID,  Bacteria ENTEROBACTER AEROGENES (A)  Final      Susceptibility   Enterobacter aerogenes - MIC*    CEFAZOLIN >=64 RESISTANT Resistant     CEFEPIME <=0.12 SENSITIVE Sensitive     CEFTRIAXONE <=0.25 SENSITIVE Sensitive     CIPROFLOXACIN <=0.25 SENSITIVE Sensitive     GENTAMICIN <=1 SENSITIVE Sensitive     IMIPENEM 2 SENSITIVE Sensitive     NITROFURANTOIN 64 INTERMEDIATE Intermediate     TRIMETH/SULFA <=20 SENSITIVE Sensitive     PIP/TAZO <=4 SENSITIVE Sensitive     * >=100,000 COLONIES/mL ENTEROBACTER AEROGENES  Urine Culture     Status: Abnormal   Collection Time: 09/04/20 11:58 AM   Specimen: Urine, Catheterized  Result Value Ref Range Status   Specimen Description URINE, CATHETERIZED  Final   Special Requests   Final    NONE Performed at [facility], [place] [address], [place], [place] [address]    Culture 40,000 COLONIES/mL ENTEROBACTER AEROGENES (A)  Final   Report Status 09/06/2020 FINAL  Final   Organism ID, Bacteria ENTEROBACTER AEROGENES (A)  Final      Susceptibility   Enterobacter aerogenes - MIC*    CEFAZOLIN RESISTANT Resistant     CEFEPIME <=0.12 SENSITIVE Sensitive     CEFTRIAXONE <=0.25 SENSITIVE Sensitive     CIPROFLOXACIN <=0.25 SENSITIVE Sensitive     GENTAMICIN <=1 SENSITIVE Sensitive     IMIPENEM 1 SENSITIVE Sensitive     NITROFURANTOIN 64 INTERMEDIATE Intermediate     TRIMETH/SULFA <=20 SENSITIVE Sensitive     PIP/TAZO <=4 SENSITIVE Sensitive     * 40,000 COLONIES/mL ENTEROBACTER AEROGENES      Radiology Studies: No results found.   Scheduled Meds:  calcium carbonate  1,000 mg of elemental calcium Per Tube BID WC   chlorhexidine  15 mL Mouth Rinse BID   Chlorhexidine Gluconate Cloth  6 each Topical Q0600   Chlorhexidine Gluconate Cloth  6 each Topical Q0600   darbepoetin (ARANESP) injection - DIALYSIS  60 mcg Subcutaneous Q Mon-HD   feeding supplement  237 mL Oral TID BM   finasteride  5 mg Oral Daily   heparin injection (subcutaneous)  5,000  Units Subcutaneous Q8H   insulin aspart  0-5 Units Subcutaneous QHS   insulin aspart  0-9 Units Subcutaneous TID WC   lidocaine  1 patch Transdermal Q24H   magic mouthwash  10 mL Oral TID   mouth rinse  15 mL Mouth Rinse q12n4p   midodrine  10 mg Oral BID WC   mirtazapine  15 mg Oral QHS   multivitamin with minerals  1 tablet Oral Daily   senna-docusate  1 tablet Oral BID   tamsulosin  0.4 mg Oral QHS   Continuous Infusions:  sodium chloride Stopped (08/22/20 1544)   cefTRIAXone (ROCEPHIN)  IV 1 g (09/06/20 1501)     LOS: 27 days   Signature  Phillips Climes M.D on 09/07/2020 at 1:04 PM   -  To page go to [email or web address]

## 2020-09-07 NOTE — TOC Progression Note (Signed)
Transition of Care [facility]) - Progression Note    Patient Details  Name: Jeff Rodriguez MRN: [id number] Date of Birth: 29-Nov-1945  Transition of [facility]) CM/SW [place], LCSW Phone Number: 09/07/2020, 3:23 PM  Clinical Narrative:    CSW contacted Melissa with [facility] again. She stated she will let me know bed availability when she gets to the office tomorrow.    Expected Discharge Plan: Skilled Nursing Facility Barriers to Discharge: SNF Pending bed offer  Expected Discharge Plan and Services Expected Discharge Plan: [place]   Discharge Planning Services: CM Consult Post Acute Care Choice: [place] Living arrangements for the past 2 months: Single Family Home                                       Social Determinants of Health (SDOH) Interventions    Readmission Risk Interventions No flowsheet data found.

## 2020-09-07 NOTE — Progress Notes (Signed)
PT Cancellation Note  Patient Details Name: Jeff Rodriguez MRN: [id number] DOB: 06-02-1945   Cancelled Treatment:    Reason Eval/Treat Not Completed: Patient at procedure or test/unavailable. Pt declining due to awaiting transport for HD.   Lorriane Shire 09/07/2020, 11:41 AM  Lorrin Goodell, PT  Office # [phone] Pager [phone]

## 2020-09-08 DIAGNOSIS — N179 Acute kidney failure, unspecified: Secondary | ICD-10-CM | POA: Diagnosis not present

## 2020-09-08 DIAGNOSIS — N390 Urinary tract infection, site not specified: Secondary | ICD-10-CM

## 2020-09-08 LAB — CBC
HCT: 25 % — ABNORMAL LOW (ref 39.0–52.0)
Hemoglobin: 8.2 g/dL — ABNORMAL LOW (ref 13.0–17.0)
MCH: 31.2 pg (ref 26.0–34.0)
MCHC: 32.8 g/dL (ref 30.0–36.0)
MCV: 95.1 fL (ref 80.0–100.0)
Platelets: 215 10*3/uL (ref 150–400)
RBC: 2.63 MIL/uL — ABNORMAL LOW (ref 4.22–5.81)
RDW: 16.2 % — ABNORMAL HIGH (ref 11.5–15.5)
WBC: 6.2 10*3/uL (ref 4.0–10.5)
nRBC: 0 % (ref 0.0–0.2)

## 2020-09-08 LAB — RENAL FUNCTION PANEL
Albumin: 2.6 g/dL — ABNORMAL LOW (ref 3.5–5.0)
Anion gap: 9 (ref 5–15)
BUN: 48 mg/dL — ABNORMAL HIGH (ref 8–23)
CO2: 26 mmol/L (ref 22–32)
Calcium: 8.8 mg/dL — ABNORMAL LOW (ref 8.9–10.3)
Chloride: 99 mmol/L (ref 98–111)
Creatinine, Ser: 3.59 mg/dL — ABNORMAL HIGH (ref 0.61–1.24)
GFR, Estimated: 17 mL/min — ABNORMAL LOW (ref >60)
Glucose, Bld: 147 mg/dL — ABNORMAL HIGH (ref 70–99)
Phosphorus: 2.8 mg/dL (ref 2.5–4.6)
Potassium: 3.5 mmol/L (ref 3.5–5.1)
Sodium: 134 mmol/L — ABNORMAL LOW (ref 135–145)

## 2020-09-08 LAB — GLUCOSE, CAPILLARY
Glucose-Capillary: 164 mg/dL — ABNORMAL HIGH (ref 70–99)
Glucose-Capillary: 135 mg/dL — ABNORMAL HIGH (ref 70–99)
Glucose-Capillary: 249 mg/dL — ABNORMAL HIGH (ref 70–99)
Glucose-Capillary: 136 mg/dL — ABNORMAL HIGH (ref 70–99)
Glucose-Capillary: 192 mg/dL — ABNORMAL HIGH (ref 70–99)

## 2020-09-08 NOTE — Progress Notes (Signed)
Physical Therapy Treatment Patient Details Name: Jeff Rodriguez MRN: [id number] DOB: 1945/09/23 Today's Date: 09/08/2020    History of Present Illness Patient is a 74 year old male who was admitted to the hospital on 6/22 with COVID 19 and AKI; at [facility], had initiated Renal Replacement Therapy, transferred to [facility];  past medical history significant for multiple myeloma, diabetes mellitus, hypertension, asthma, hyperlipidemia, osteoarthritis.    PT Comments    Patient received in bed, very pleasant and seems to be much more alert this morning. Still needs extra time for cue/command following and problem solving, but improved overall. Tolerated progression of gait/activity well today but fatigued. Attempted again to have BM but still unsuccessful. Left up in recliner with all needs met, chair alarm active and family present. Improving for sure, but remains weak and deconditioned, would still benefit from SNF.    Follow Up Recommendations  SNF     Equipment Recommendations  Rolling walker with 5" wheels;3in1 (PT)    Recommendations for Other Services       Precautions / Restrictions Precautions Precautions: Fall Restrictions Weight Bearing Restrictions: No    Mobility  Bed Mobility Overal bed mobility: Needs Assistance Bed Mobility: Supine to Sit     Supine to sit: Min assist;HOB elevated     General bed mobility comments: light MinA to raise trunk and to gain balance in sitting, increased time    Transfers Overall transfer level: Needs assistance Equipment used: 4-wheeled walker Transfers: Sit to/from Stand Sit to Stand: Min assist         General transfer comment: MinA to power up, did still need reminders of hand placement and locking brakes on 4WW for all transfers  Ambulation/Gait Ambulation/Gait assistance: Min guard Gait Distance (Feet): 42 Feet (88ft, 36ft, 11ft) Assistive device: 4-wheeled walker Gait Pattern/deviations: Trunk flexed;Drifts  right/left;Step-through pattern;Decreased step length - right;Decreased step length - left;Narrow base of support Gait velocity: decreased   General Gait Details: only needed min guard for balance/safety today, continues to require cues to keep 4WW close enough. Fatigued but tolerated gait well today with multiple sitting rest breaks   Stairs             Wheelchair Mobility    Modified Rankin (Stroke Patients Only)       Balance Overall balance assessment: Needs assistance Sitting-balance support: No upper extremity supported Sitting balance-Leahy Scale: Fair     Standing balance support: Bilateral upper extremity supported Standing balance-Leahy Scale: Poor Standing balance comment: heavy reliance on BUE support on rollator                            Cognition Arousal/Alertness: Awake/alert Behavior During Therapy: WFL for tasks assessed/performed Overall Cognitive Status: Impaired/Different from baseline                         Following Commands: Follows one step commands with increased time;Follows multi-step commands with increased time Safety/Judgement: Decreased awareness of safety;Decreased awareness of deficits Awareness: Emergent Problem Solving: Slow processing;Decreased initiation;Difficulty sequencing;Requires verbal cues General Comments: much more awake and clear this morning; still requires increased time for cue following and problem solving, but improved      Exercises      General Comments        Pertinent Vitals/Pain Pain Assessment: Faces Faces Pain Scale: No hurt Pain Intervention(s): Limited activity within patient's tolerance;Monitored during session    Home Living  Prior Function            PT Goals (current goals can now be found in the care plan section) Acute Rehab PT Goals Patient Stated Goal: Go home PT Goal Formulation: With patient Time For Goal Achievement:  09/14/20 Potential to Achieve Goals: Good Progress towards PT goals: Progressing toward goals    Frequency    Min 2X/week      PT Plan Current plan remains appropriate    Co-evaluation              AM-PAC PT "6 Clicks" Mobility   Outcome Measure  Help needed turning from your back to your side while in a flat bed without using bedrails?: A Little Help needed moving from lying on your back to sitting on the side of a flat bed without using bedrails?: A Lot Help needed moving to and from a bed to a chair (including a wheelchair)?: A Little Help needed standing up from a chair using your arms (e.g., wheelchair or bedside chair)?: A Little Help needed to walk in hospital room?: A Little Help needed climbing 3-5 steps with a railing? : Total 6 Click Score: 15    End of Session Equipment Utilized During Treatment: Gait belt Activity Tolerance: Patient tolerated treatment well Patient left: in chair;with call bell/phone within reach;with chair alarm set;with family/visitor present Nurse Communication: Mobility status PT Visit Diagnosis: Other abnormalities of gait and mobility (R26.89);Muscle weakness (generalized) (M62.81)     Time: 4718-5501 PT Time Calculation (min) (ACUTE ONLY): 26 min  Charges:  $Gait Training: 8-22 mins $Therapeutic Activity: 8-22 mins                    Windell Norfolk, DPT, PN1   Supplemental Physical Therapist [place]    Pager [phone] Acute Rehab Office [phone]

## 2020-09-08 NOTE — Progress Notes (Addendum)
Subjective:   Patient feel well this morning. Appetite is improving. Denies any N/V, chest pain or SOB. UOP of 3.3 L over the last 24 hrs.   Objective Vital signs in last 24 hours: Vitals:   09/08/20 0500 09/08/20 0728 09/08/20 0800 09/08/20 1130  BP:  115/77 97/70 114/73  Pulse:  90  98  Resp:  19  19  Temp:  (!) 97.5 F (36.4 C)  (!) 97.5 F (36.4 C)  TempSrc:  Oral  Oral  SpO2:  98%  100%  Weight: 61.6 kg     Height:       Weight change: -0.7 kg  Intake/Output Summary (Last 24 hours) at 09/08/2020 0801 Last data filed at 09/08/2020 0500 Gross per 24 hour  Intake --  Output 2800 ml  Net -2800 ml    Assessment/ Plan: Pt is a 75 y.o. yo male with PMH of multiple myeloma who was admitted on 08/11/2020 with Covid19 and found to have acute on chronic renal failure.   Assessment/Plan: 1. AKI on CKD2 - Likely 2/2 ATN from hypotension/ACEi. Baseline sCr around 1.7 around 02/2020. Presented with sCr of 7.3. Has trended down slowly to 3.59 today. BUN improving as well (52->53->48). Feeling better, no uremic symptoms. Appetite slowly improving. Making appropriate urine with UOP of 3.3 L with net I/O of 3L over last 24 hrs. Will continue to monitor kidney function and reassess daily. No need for HD today. Anticipate slow improvement in BUN/sCr.  --Remove temporary HD catheter today  --Encouraging increased po intake  --Trend BMP, electrolytes  --Strict I/Os, daily weight 2. HTN/volume: BP improved. SBP in the 100s-120s. Encourgae po intake and continue midodrine 10 mg BID 3. Anemia: S/p 1 unit pRBC. Hgb improved to 8.2 from 7.4 yesterday. Continue Aranesp 100 mcg w/ HD 4. Hypocalcemia: Asymptomatic. Albumin (2.3->2.6) and calcium (8.5->8.8) trending up.  5. Hx of multiple myeloma: Stable. Negative bone scan.  6. Pancytopenia: management per hem/onc 7. Covid19 infection: No respiratory symptoms. Completed course Remdesivir.   Lacinda Axon   Patient seen and examined, agree with  above note with above modifications.  Has not had really much dialysis at all since 7/2-  has had slow improvement of renal function and overall improvement globally -  feel OK about removing HD cath and starting to make plans for discharge from the hospital  Corliss Parish, MD 09/08/2020     Labs: Basic Metabolic Panel: Recent Labs  Lab 09/06/20 0314 09/07/20 0335 09/08/20 0101  NA 136 133* 134*  K 3.2* 3.7 3.5  CL 99 97* 99  CO2 _0 GLUCOSE 137* 222* 147*  BUN 52* 53* 48*  CREATININE 3.90* 3.73* 3.59*  CALCIUM 8.6* 8.5* 8.8*  PHOS 2.4* 2.1* 2.8   Liver Function Tests: Recent Labs  Lab 09/02/20 0412 09/03/20 0054 09/04/20 0058 09/06/20 0314 09/07/20 0335 09/08/20 0101  AST 31 23  --   --   --   --   ALT 35 34  --   --   --   --   ALKPHOS 57 62  --   --   --   --   BILITOT 0.5 0.3  --   --   --   --   PROT 6.9 7.3  --   --   --   --   ALBUMIN 2.3* 2.3*   < > 2.4* 2.3* 2.6*   < > = values in this interval not displayed.   No results for  input(s): LIPASE, AMYLASE in the last 168 hours. No results for input(s): AMMONIA in the last 168 hours. CBC: Recent Labs  Lab 09/02/20 0816 09/03/20 0054 09/04/20 1405 09/06/20 0314 09/07/20 0335 09/08/20 0101  WBC 5.3 4.7 3.7* 4.5 5.5 6.2  NEUTROABS 3.0 2.7  --   --   --   --   HGB 8.5* 8.5* 8.1* 7.4* 7.4* 8.2*  HCT 26.0* 25.9* 25.1* 23.2* 22.6* 25.0*  MCV 94.5 93.5 95.8 96.7 96.2 95.1  PLT 174 189 195 196 194 215   Cardiac Enzymes: No results for input(s): CKTOTAL, CKMB, CKMBINDEX, TROPONINI in the last 168 hours. CBG: Recent Labs  Lab 09/06/20 1801 09/06/20 2107 09/07/20 0736 09/07/20 1612 09/07/20 2053  GLUCAP 203* 225* 127* 265* 164*    Iron Studies:  Recent Labs    09/06/20 1620  IRON 51  TIBC 178*  FERRITIN 991*   Studies/Results: No results found. Medications: Infusions:  sodium chloride Stopped (08/22/20 1544)   cefTRIAXone (ROCEPHIN)  IV 1 g (09/07/20 1608)    Scheduled  Medications:  calcium carbonate  1,000 mg of elemental calcium Per Tube BID WC   chlorhexidine  15 mL Mouth Rinse BID   Chlorhexidine Gluconate Cloth  6 each Topical Q0600   Chlorhexidine Gluconate Cloth  6 each Topical Q0600   [START ON 09/14/2020] darbepoetin (ARANESP) injection - DIALYSIS  100 mcg Subcutaneous Q Mon-HD   feeding supplement  237 mL Oral TID BM   finasteride  5 mg Oral Daily   heparin injection (subcutaneous)  5,000 Units Subcutaneous Q8H   insulin aspart  0-5 Units Subcutaneous QHS   insulin aspart  0-9 Units Subcutaneous TID WC   lidocaine  1 patch Transdermal Q24H   magic mouthwash  10 mL Oral TID   mouth rinse  15 mL Mouth Rinse q12n4p   midodrine  10 mg Oral BID WC   mirtazapine  15 mg Oral QHS   multivitamin with minerals  1 tablet Oral Daily   senna-docusate  1 tablet Oral BID   tamsulosin  0.4 mg Oral QHS    have reviewed scheduled and prn medications.  Physical Exam: General: Pleasant, chronically ill and cachetic elderly man in chair. NAD Heart: RRR. No m/r/g. No LE edema Lungs: Clear to ascultation bilaterally. No wheezing or rales Abdomen: Soft. NT/ND. Normal bowel sounds Extremities: Normal ROM. No edema Dialysis Access: RIJ temp HD catheter C/D/I. No tenderness around HD site.     09/08/2020,8:01 AM  LOS: 28 days

## 2020-09-08 NOTE — Progress Notes (Signed)
PROGRESS NOTE    Jeff Rodriguez  IHK:[id number] DOB: 02-17-1946 DOA: 08/11/2020 PCP: [facility]    Chief Complaint  Patient presents with   Cough    Brief Narrative:    75 y.o. male with medical history significant of multiple myeloma (on [name]), diabetes, hypertension, asthma, hyperlipidemia and osteoarthritis who was brought in by EMS secondary to productive cough, poor oral intake and not eating much.  Patient is currently undergoing chemo for his multiple myeloma. Tested positive for COVID 19, found to be hypokalemic and  in acute renal failure.  Nephrology team following.  in view of severe thrombocytopenia, oncology consulted and neupogen given. Bone scan 08/18/2020 negative for lytic lesions.  Due to poor prognosis, palliative care team consulted and patient decided for CRRT.  Per nephrology patient was transferred to [facility] HD.  His anemia, thrombocytopenia is improved, renal function is gradually improving, but patient with very poor appetite, which which was thought at one point may be related to uremia, and patient has been considered on and off for HD over the last 2 weeks, final decision has been made on 7/18 where patient will not need HD no more, where his temporary hemodialysis has been discontinued 7/19 .   Subjective:    Ports some nausea, had some bowel movement yesterday, he remains with poor appetite as discussed with staff .  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   -Patient with poor oral intake, and nausea, this felt to be secondary to uremia, she has been considered for HD  multiple times, HD attempted 7/15, has stopped after 30 minutes given soft blood pressure. -As discussed with renal, patient unlikely will need any further hemodialysis, so can discontinue his temporary hemodialysis catheter, it will be discontinued today. -Continue to monitor BMP daily.  Multiple myeloma  - Dr. Ennever/oncology on board. Bone scan does not show any lytic lesions at this time. -Patient on [name], which is on hold due to renal sufficiency.  Urinary retention -800 cc on bladder scan 7/14, Foley catheter inserted, urine is very cloudy, UA was obtained, showing pyuria, continue with Rocephin, urine culture growing Enterobacter aerogenes, will treat for 7 days. Start date 7/15  Anemia of chronic disease  -continue to monitor closely and transfuse for Hgb <7.  Lab Results  Component Value Date   WBC 6.2 09/08/2020   HGB 8.2 (L) 09/08/2020   HCT 25.0 (L) 09/08/2020   MCV 95.1 09/08/2020   PLT 215 09/08/2020    Underlying depression and oral intake being poor for several weeks according to the son/failure to thrive  not suicidal homicidal.  Placed on Remeron and monitor.   -Encourage oral intake  Severe thrombocytopenia  - due to multiple myeloma counts much improved.  Acute metabolic encephalopathy  -All mentation has improved, patient is answering questions appropriately ,. Initial CT of the head is negative for acute intracranial abnormalities.TSH, vitamin B12 normal, ammonia level within normal limits, this problem is almost completely resolved.  COVID 19 virus infection. Completed the course of remdesivir, continue with flutter valve and incentive spirometer and inhalers as needed. Therapy  evaluations recommending SNF. He is off isolation.  Hypokalemia and hypomagnesemia.  Both replaced.  Hypertension: Well controlled.   Hyperlipidemia - resume home dose statin once acute issues better.  Severe PCM.  He had NG tube with tube feeds, NG accidentally pulled out  on 08/26/2020, monitor on oral protein supplements and oral diet.  Appetite remains poor  Type 2 diabetes mellitus -labile sugars due to labile oral intake, monitor on low-dose sliding scale  CBG (last 3)  Recent Labs    09/07/20 2053 09/08/20 0828 09/08/20 1135  GLUCAP 164* 135* 249*      Pressure injury on the back.  Pressure Injury 08/21/20 Coccyx Right;Left;Mid Stage 2 -  Partial thickness loss of dermis presenting as a shallow open injury with a red, pink wound bed without slough. (Active)  08/21/20 2000  Location: Coccyx  Location Orientation: Right;Left;Mid  Staging: Stage 2 -  Partial thickness loss of dermis presenting as a shallow open injury with a red, pink wound bed without slough.  Wound Description (Comments):   Present on Admission:      Pressure Injury 08/24/20 Coccyx Medial Stage 2 -  Partial thickness loss of dermis presenting as a shallow open injury with a red, pink wound bed without slough. pink open (Active)  08/24/20 0910  Location: Coccyx  Location Orientation: Medial  Staging: Stage 2 -  Partial thickness loss of dermis presenting as a shallow open injury with a red, pink wound bed without slough.  Wound Description (Comments): pink open  Present on Admission: No   Wound care   consulted.    Failure to thrive - Dietary will be consulted, PT -OT   In view of multiple comorbidities, clinical deterioration, hemodialysis dependent, AKI, poor progression palliative care consulted for goals of care discussion.    DVT prophylaxis: [place] heparin Code Status: DNR - previous discussed with patient in detail on 08/28/2020 wants to be DNR, does not want a feeding tube Family Communication:  D/W son [name] by phone 7/19 Disposition:   Status is: Inpatient  Remains inpatient appropriate because:Ongoing diagnostic testing needed not appropriate for outpatient work up and Unsafe d/c plan  Dispo: The patient is from: Home              Anticipated d/c is to: SNF               Patient currently is not medically stable to d/c.  Patient is getting closer to discharge, hopefully he can go once renal function remains stable and bed available at SNF, [facility]   Difficult to place patient No   Consultants:  Renal, Oncology, Pall Care  Procedures: none.   Antimicrobials:  Antibiotics Given (last 72 hours)     Date/Time Action Medication Dose Rate   09/06/20 1501 New Bag/Given   cefTRIAXone (ROCEPHIN) 1 g in sodium chloride 0.9 % 100 mL IVPB 1 g 200 mL/hr   09/07/20 1608 New Bag/Given   cefTRIAXone (ROCEPHIN) 1 g in sodium chloride 0.9 % 100 mL IVPB 1 g 200 mL/hr   09/08/20 1216 New Bag/Given   cefTRIAXone (ROCEPHIN) 1 g in sodium chloride 0.9 % 100 mL IVPB 1 g 200 mL/hr         Objective: Vitals:   09/08/20 0500 09/08/20 0728 09/08/20 0800 09/08/20 1130  BP:  115/77 97/70 114/73  Pulse:  90  98  Resp:  19  19  Temp:  (!) 97.5 F (36.4 C)  (!) 97.5 F (36.4 C)  TempSrc:  Oral  Oral  SpO2:  98%  100%  Weight: 61.6 kg     Height:        Intake/Output Summary (Last 24 hours) at 09/08/2020 1502 Last data filed at 09/08/2020 1200 Gross per 24 hour  Intake 340 ml  Output 3025 ml  Net -2685 ml   Filed Weights   09/06/20 0437 09/07/20 0500 09/08/20 0500  Weight: 63.7 kg 62.3 kg 61.6 kg    Examination:   Awake Alert, Oriented X 3, extremely frail, deconditioned  symmetrical Chest wall movement, Good air movement bilaterally, CTAB RRR,No Gallops,Rubs or new Murmurs, No Parasternal Heave +ve B.Sounds, Abd Soft, No tenderness, No rebound - guarding or rigidity. No Cyanosis, Clubbing or edema, No new Rash or bruise        Data Reviewed: I have personally reviewed following labs and imaging studies  CBC: Recent Labs  Lab 09/02/20 0816 09/03/20 0054 09/04/20 1405 09/06/20 0314 09/07/20 0335 09/08/20 0101  WBC 5.3 4.7 3.7* 4.5 5.5 6.2  NEUTROABS 3.0 2.7  --   --   --   --   HGB 8.5* 8.5* 8.1* 7.4* 7.4*  8.2*  HCT 26.0* 25.9* 25.1* 23.2* 22.6* 25.0*  MCV 94.5 93.5 95.8 96.7 96.2 95.1  PLT 174 189 195 196 194 161    Basic Metabolic Panel: Recent Labs  Lab 09/02/20 0412 09/03/20 0054 09/04/20 0058 09/05/20 0116 09/06/20 0314 09/07/20 0335 09/08/20 0101  NA 132* 133* 132* 133* 136 133* 134*  K 3.7 3.8 3.6 3.2* 3.2* 3.7 3.5  CL 100 99 96* 99 99 97* 99  CO2 $Re'23 25 25 26 29 29 26  'CZc$ GLUCOSE 129* 152* 130* 122* 137* 222* 147*  BUN 56* 60* 63* 49* 52* 53* 48*  CREATININE 4.48* 4.67* 4.72* 3.84* 3.90* 3.73* 3.59*  CALCIUM 8.5* 8.6* 9.0 8.4* 8.6* 8.5* 8.8*  MG 1.7 1.9  --   --   --   --   --   PHOS  --   --  4.2 2.7 2.4* 2.1* 2.8    GFR: Estimated Creatinine Clearance: 15.7 mL/min (A) (by C-G formula based on SCr of 3.59 mg/dL (H)).  Liver Function Tests: Recent Labs  Lab 09/02/20 0412 09/03/20 0054 09/04/20 0058 09/05/20 0116 09/06/20 0314 09/07/20 0335 09/08/20 0101  AST 31 23  --   --   --   --   --   ALT 35 34  --   --   --   --   --   ALKPHOS 57 62  --   --   --   --   --   BILITOT 0.5 0.3  --   --   --   --   --   PROT 6.9 7.3  --   --   --   --   --   ALBUMIN 2.3* 2.3* 2.4* 2.5* 2.4* 2.3* 2.6*    CBG: Recent Labs  Lab 09/07/20 0736 09/07/20 1612 09/07/20 2053 09/08/20 0828 09/08/20 1135  GLUCAP 127* 265* 164* 135* 249*     Recent Results (from the past 240 hour(s))  Culture, Urine     Status: Abnormal   Collection Time: 09/01/20 10:55 AM   Specimen: Urine, Clean Catch  Result Value Ref Range Status   Specimen Description URINE, CLEAN CATCH  Final   Special Requests   Final    NONE Performed at [facility], [place] [address] [place], [place] [address]    Culture >=100,000  COLONIES/mL ENTEROBACTER AEROGENES (A)  Final   Report Status 09/03/2020 FINAL  Final   Organism ID, Bacteria ENTEROBACTER AEROGENES (A)  Final      Susceptibility   Enterobacter aerogenes - MIC*    CEFAZOLIN >=64 RESISTANT Resistant     CEFEPIME <=0.12 SENSITIVE Sensitive      CEFTRIAXONE <=0.25 SENSITIVE Sensitive     CIPROFLOXACIN <=0.25 SENSITIVE Sensitive     GENTAMICIN <=1 SENSITIVE Sensitive     IMIPENEM 2 SENSITIVE Sensitive     NITROFURANTOIN 64 INTERMEDIATE Intermediate     TRIMETH/SULFA <=20 SENSITIVE Sensitive     PIP/TAZO <=4 SENSITIVE Sensitive     * >=100,000 COLONIES/mL ENTEROBACTER AEROGENES  Urine Culture     Status: Abnormal   Collection Time: 09/04/20 11:58 AM   Specimen: Urine, Catheterized  Result Value Ref Range Status   Specimen Description URINE, CATHETERIZED  Final   Special Requests   Final    NONE Performed at [facility], [place] [address], [place], [place] [address]    Culture 40,000 COLONIES/mL ENTEROBACTER AEROGENES (A)  Final   Report Status 09/06/2020 FINAL  Final   Organism ID, Bacteria ENTEROBACTER AEROGENES (A)  Final      Susceptibility   Enterobacter aerogenes - MIC*    CEFAZOLIN RESISTANT Resistant     CEFEPIME <=0.12 SENSITIVE Sensitive     CEFTRIAXONE <=0.25 SENSITIVE Sensitive     CIPROFLOXACIN <=0.25 SENSITIVE Sensitive     GENTAMICIN <=1 SENSITIVE Sensitive     IMIPENEM 1 SENSITIVE Sensitive     NITROFURANTOIN 64 INTERMEDIATE Intermediate     TRIMETH/SULFA <=20 SENSITIVE Sensitive     PIP/TAZO <=4 SENSITIVE Sensitive     * 40,000 COLONIES/mL ENTEROBACTER AEROGENES      Radiology Studies: No results found.   Scheduled Meds:  calcium carbonate  1,000 mg of elemental calcium Per Tube BID WC   chlorhexidine  15 mL Mouth Rinse BID   Chlorhexidine Gluconate Cloth  6 each Topical Q0600   Chlorhexidine Gluconate Cloth  6 each Topical Q0600   [START ON 09/14/2020] darbepoetin (ARANESP) injection - DIALYSIS  100 mcg Subcutaneous Q Mon-HD   feeding supplement  237 mL Oral TID BM   finasteride  5 mg Oral Daily   heparin injection (subcutaneous)  5,000 Units Subcutaneous Q8H   insulin aspart  0-5 Units Subcutaneous QHS   insulin aspart  0-9 Units Subcutaneous TID WC   lidocaine  1 patch Transdermal  Q24H   magic mouthwash  10 mL Oral TID   mouth rinse  15 mL Mouth Rinse q12n4p   midodrine  10 mg Oral BID WC   mirtazapine  15 mg Oral QHS   multivitamin with minerals  1 tablet Oral Daily   senna-docusate  1 tablet Oral BID   tamsulosin  0.4 mg Oral QHS   Continuous Infusions:  sodium chloride Stopped (08/22/20 1544)   cefTRIAXone (ROCEPHIN)  IV 1 g (09/08/20 1216)     LOS: 28 days   Signature  Phillips Climes M.D on 09/08/2020 at 3:02 PM   -  To page go to [email or web address]

## 2020-09-08 NOTE — TOC Progression Note (Addendum)
Transition of Care [facility]) - Progression Note    Patient Details  Name: Jeff Rodriguez MRN: [id number] Date of Birth: [birth year]  Transition of Care [facility]) CM/SW [place], LCSW Phone Number: 09/08/2020, 9:17 AM  Clinical Narrative:    9:17am-CSW faxed updated clinicals to [facility] and is awaiting response. If patient becomes medically stable and [facility] still has not responded, will send him to a local SNF instead.    12pm-CSW spoke with Melissa at [facility]. She stated she may have availability coming up and will call CSW back by the end of the day.  5pm-CSW left Melissa a voicemail as she did not call back regarding availability. If they can indeed accept patient, facility would need to obtain [facility] authorization (plan info: [phone] Group [id number]-[place] - [place] Phone: [phone]).    Expected Discharge Plan: Skilled Nursing Facility Barriers to Discharge: SNF Pending bed offer  Expected Discharge Plan and Services Expected Discharge Plan: [place]   Discharge Planning Services: CM Consult Post Acute Care Choice: [facility] Living arrangements for the past 2 months: Single Family Home                                       Social Determinants of Health (SDOH) Interventions    Readmission Risk Interventions No flowsheet data found.

## 2020-09-09 DIAGNOSIS — E118 Type 2 diabetes mellitus with unspecified complications: Secondary | ICD-10-CM | POA: Diagnosis not present

## 2020-09-09 DIAGNOSIS — U071 COVID-19: Secondary | ICD-10-CM | POA: Diagnosis not present

## 2020-09-09 DIAGNOSIS — N179 Acute kidney failure, unspecified: Secondary | ICD-10-CM | POA: Diagnosis not present

## 2020-09-09 LAB — BASIC METABOLIC PANEL
Anion gap: 10 (ref 5–15)
BUN: 51 mg/dL — ABNORMAL HIGH (ref 8–23)
CO2: 26 mmol/L (ref 22–32)
Calcium: 9.2 mg/dL (ref 8.9–10.3)
Chloride: 97 mmol/L — ABNORMAL LOW (ref 98–111)
Creatinine, Ser: 3.58 mg/dL — ABNORMAL HIGH (ref 0.61–1.24)
GFR, Estimated: 17 mL/min — ABNORMAL LOW (ref >60)
Glucose, Bld: 274 mg/dL — ABNORMAL HIGH (ref 70–99)
Potassium: 3.4 mmol/L — ABNORMAL LOW (ref 3.5–5.1)
Sodium: 133 mmol/L — ABNORMAL LOW (ref 135–145)

## 2020-09-09 LAB — CBC
HCT: 24.8 % — ABNORMAL LOW (ref 39.0–52.0)
Hemoglobin: 8.1 g/dL — ABNORMAL LOW (ref 13.0–17.0)
MCH: 31.5 pg (ref 26.0–34.0)
MCHC: 32.7 g/dL (ref 30.0–36.0)
MCV: 96.5 fL (ref 80.0–100.0)
Platelets: 228 10*3/uL (ref 150–400)
RBC: 2.57 MIL/uL — ABNORMAL LOW (ref 4.22–5.81)
RDW: 16.6 % — ABNORMAL HIGH (ref 11.5–15.5)
WBC: 5.5 10*3/uL (ref 4.0–10.5)
nRBC: 0 % (ref 0.0–0.2)

## 2020-09-09 LAB — GLUCOSE, CAPILLARY
Glucose-Capillary: 222 mg/dL — ABNORMAL HIGH (ref 70–99)
Glucose-Capillary: 175 mg/dL — ABNORMAL HIGH (ref 70–99)
Glucose-Capillary: 338 mg/dL — ABNORMAL HIGH (ref 70–99)
Glucose-Capillary: 171 mg/dL — ABNORMAL HIGH (ref 70–99)

## 2020-09-09 MED ORDER — POTASSIUM CHLORIDE 20 MEQ PO PACK
40.0000 meq | PACK | Freq: Once | ORAL | Status: AC
  Administered 2020-09-09: 40 meq via ORAL
  Filled 2020-09-09: qty 2

## 2020-09-09 MED ORDER — BOOST PLUS PO LIQD
237.0000 mL | Freq: Three times a day (TID) | ORAL | Status: DC
  Administered 2020-09-09 – 2020-09-11 (×6): 237 mL via ORAL
  Filled 2020-09-09 (×9): qty 237

## 2020-09-09 NOTE — Progress Notes (Addendum)
Subjective:   Patient stable overnight. Appetite is improving. No nausea, abd pain, SOB or CP. UOP of 1.7L over last 24 hrs.  Objective Vital signs in last 24 hours: Vitals:   09/08/20 2101 09/09/20 0000 09/09/20 0435 09/09/20 0743  BP: 126/78 125/75 120/70 115/69  Pulse: 93 92 90 98  Resp: $Remo'18 18 18 16  'QCUEw$ Temp: 98.1 F (36.7 C) 98.2 F (36.8 C) 98.1 F (36.7 C) 98.4 F (36.9 C)  TempSrc: Oral Oral Oral Oral  SpO2: 98% 98% 98% 98%  Weight:      Height:       Weight change:   Intake/Output Summary (Last 24 hours) at 09/09/2020 1109 Last data filed at 09/09/2020 1000 Gross per 24 hour  Intake 590 ml  Output 1725 ml  Net -1135 ml    Assessment/ Plan: Pt is a 75 y.o. yo male with PMH of multiple myeloma who was admitted on 08/11/2020 with Covid19 and found to have acute on chronic renal failure.   Assessment/Plan: 1. AKI on CKD2 - Likely 2/2 ATN from hypotension/ACEi. Baseline sCr around 1.7 around 02/2020. Presented with sCr of 7.3. Kidney function stable, unchanged from yesterday. Continues to feel well, no nausea. Making appropriate urine with UOP of 1.7 L with net I/O of 1.1L over last 24 hrs. Continue to monitor for gradual improvement.   --Continue to encouraging increased po intake  --K+ 3.4, repleting with KCL 40 mEq x1 dose  --Trend BMP, electrolytes  --Strict I/Os, daily weight  --Pending discharge to SNF 2. HTN/volume: BP continues to improve. BP of 110-120s/80-90s overnight. Continue midodrine 10 mg BID 3. Anemia: S/p 1 unit pRBC. Hgb stable at 8.1. Continue to monitor closely.   Iron stores OK-  have him on aranesp 4. Malnutrition: Weight down to 136 lbs. Previously around 150-160 before hospitalization, per patient. Continue Regular diet and nutritional supplements. 5. Hypocalcemia: Asymptomatic. Albumin (2.3->2.6) and calcium (8.5->8.8) trending up.  6. Hx of multiple myeloma: Stable. Negative bone scan.  7. Pancytopenia: management per hem/onc 8. Covid19 infection:  No respiratory symptoms. Completed course Remdesivir.   Lacinda Axon   Patient seen and examined, agree with above note with above modifications. Renal function poor but stable-  is nonoliguric.  We have not continued with any dialysis support really since early in the month-  temp HD access removed-  he is pending discharge to a SNF and will require OP nephrology wherever he goes.  Will back off on labs to every other day  Corliss Parish, MD 09/09/2020      Labs: Basic Metabolic Panel: Recent Labs  Lab 09/06/20 0314 09/07/20 0335 09/08/20 0101 09/09/20 0204  NA 136 133* 134* 133*  K 3.2* 3.7 3.5 3.4*  CL 99 97* 99 97*  CO2 $Re'29 29 26 26  'XMi$ GLUCOSE 137* 222* 147* 274*  BUN 52* 53* 48* 51*  CREATININE 3.90* 3.73* 3.59* 3.58*  CALCIUM 8.6* 8.5* 8.8* 9.2  PHOS 2.4* 2.1* 2.8  --    Liver Function Tests: Recent Labs  Lab 09/03/20 0054 09/04/20 0058 09/06/20 0314 09/07/20 0335 09/08/20 0101  AST 23  --   --   --   --   ALT 34  --   --   --   --   ALKPHOS 62  --   --   --   --   BILITOT 0.3  --   --   --   --   PROT 7.3  --   --   --   --  ALBUMIN 2.3*   < > 2.4* 2.3* 2.6*   < > = values in this interval not displayed.   No results for input(s): LIPASE, AMYLASE in the last 168 hours. No results for input(s): AMMONIA in the last 168 hours. CBC: Recent Labs  Lab 09/03/20 0054 09/04/20 1405 09/06/20 0314 09/07/20 0335 09/08/20 0101 09/09/20 0204  WBC 4.7 3.7* 4.5 5.5 6.2 5.5  NEUTROABS 2.7  --   --   --   --   --   HGB 8.5* 8.1* 7.4* 7.4* 8.2* 8.1*  HCT 25.9* 25.1* 23.2* 22.6* 25.0* 24.8*  MCV 93.5 95.8 96.7 96.2 95.1 96.5  PLT 189 195 196 194 215 228   Cardiac Enzymes: No results for input(s): CKTOTAL, CKMB, CKMBINDEX, TROPONINI in the last 168 hours. CBG: Recent Labs  Lab 09/08/20 0828 09/08/20 1135 09/08/20 1612 09/08/20 2203 09/09/20 0738  GLUCAP 135* 249* 136* 192* 222*    Iron Studies:  Recent Labs    09/06/20 1620  IRON 51  TIBC  178*  FERRITIN 991*   Studies/Results: No results found. Medications: Infusions:  sodium chloride Stopped (08/22/20 1544)   cefTRIAXone (ROCEPHIN)  IV 1 g (09/08/20 1216)    Scheduled Medications:  calcium carbonate  1,000 mg of elemental calcium Per Tube BID WC   chlorhexidine  15 mL Mouth Rinse BID   Chlorhexidine Gluconate Cloth  6 each Topical Q0600   Chlorhexidine Gluconate Cloth  6 each Topical Q0600   [START ON 09/14/2020] darbepoetin (ARANESP) injection - DIALYSIS  100 mcg Subcutaneous Q Mon-HD   feeding supplement  237 mL Oral TID BM   finasteride  5 mg Oral Daily   heparin injection (subcutaneous)  5,000 Units Subcutaneous Q8H   insulin aspart  0-5 Units Subcutaneous QHS   insulin aspart  0-9 Units Subcutaneous TID WC   lidocaine  1 patch Transdermal Q24H   magic mouthwash  10 mL Oral TID   mouth rinse  15 mL Mouth Rinse q12n4p   midodrine  10 mg Oral BID WC   mirtazapine  15 mg Oral QHS   multivitamin with minerals  1 tablet Oral Daily   senna-docusate  1 tablet Oral BID   tamsulosin  0.4 mg Oral QHS    have reviewed scheduled and prn medications.  Physical Exam: General: Pleasant, chronically-ill and cachectic elderly man in bed. NAD Heart: RRR. No m/r/g. No LE edema Lungs: Lungs CTAB. No wheezing or rales Abdomen: Soft. NT/ND. Normal bowel sounds Extremities: Normal ROM. No edema Dialysis Access: temp TDC removed yesterday. No erythema or drainage from site.     09/09/2020,8:31 AM  LOS: 29 days

## 2020-09-09 NOTE — Progress Notes (Signed)
PROGRESS NOTE    Jeff Rodriguez  BSW:[id number] DOB: 1945-10-06 DOA: 08/11/2020 PCP: [facility]    Chief Complaint  Patient presents with   Cough    Brief Narrative:    75 y.o. male with medical history significant of multiple myeloma (on [name]), diabetes, hypertension, asthma, hyperlipidemia and osteoarthritis who was brought in by EMS secondary to productive cough, poor oral intake and not eating much.  Patient is currently undergoing chemo for his multiple myeloma. Tested positive for COVID 19, found to be hypokalemic and  in acute renal failure.  Nephrology team following.  in view of severe thrombocytopenia, oncology consulted and neupogen given. Bone scan 08/18/2020 negative for lytic lesions.  Due to poor prognosis, palliative care team consulted and patient decided for CRRT.  Per nephrology patient was transferred to [facility] for HD.  His anemia, thrombocytopenia is improved, renal function is gradually improving, but patient with very poor appetite, which which was thought at one point may be related to uremia, and patient has been considered on and off for HD over the last 2 weeks, final decision has been made on 7/18 where patient will not need HD no more, where his temporary hemodialysis has been discontinued 7/19 .   Subjective:    He denies any nausea or vomiting today, reports generalized weakness, fatigue, patient remains with poor appetite, but he is able to drink all of his supplements, and his fluid intake has mildly increased.     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   -Patient with  poor oral intake, and nausea, this felt to be secondary to uremia, she has been considered for HD multiple times, HD attempted 7/15, has stopped after 30 minutes given soft blood pressure. -As discussed with renal, patient unlikely will need any further hemodialysis, so can discontinue his temporary hemodialysis catheter, it will be discontinued today. -Continue to monitor BMP daily. -Good urine output over last 24 hours with 1.7 L, with net in and out of 1.1 L.  Multiple myeloma  - Dr. Ennever/oncology on board. Bone scan does not show any lytic lesions at this time. -Patient on [name], which is on hold due to renal sufficiency.  Urinary retention -800 cc on bladder scan 7/14, Foley catheter inserted, urine is very cloudy, UA was obtained, showing pyuria, continue with Rocephin, urine culture growing Enterobacter aerogenes, will treat for 7 days. Start date 7/15  Anemia of chronic disease  -continue to monitor closely and transfuse for Hgb <7.  Lab Results  Component Value Date   WBC 5.5 09/09/2020   HGB 8.1 (L) 09/09/2020   HCT 24.8 (L) 09/09/2020   MCV 96.5 09/09/2020   PLT 228 09/09/2020    Underlying depression and oral intake being poor for several weeks according to the son/failure to thrive  not suicidal homicidal.  Placed on Remeron and monitor.   -Encourage oral intake  Severe thrombocytopenia  - due to multiple myeloma counts much improved.  Acute metabolic encephalopathy  -All mentation has improved, patient is answering questions appropriately ,. Initial CT of the head is negative for acute intracranial abnormalities.TSH,  vitamin B12 normal, ammonia level within normal limits, this problem is almost completely resolved.  COVID 19 virus infection. Completed the course of remdesivir, continue with flutter valve and incentive spirometer and inhalers as needed. Therapy evaluations recommending SNF. He is off isolation.  Hypokalemia and hypomagnesemia.  Both  replaced.  Hypertension: Well controlled.   Hyperlipidemia - resume home dose statin once acute issues better.  Severe PCM.  He had NG tube with tube feeds, NG accidentally pulled out on 08/26/2020, monitor on oral protein supplements and oral diet.  Appetite remains poor  Type 2 diabetes mellitus -labile sugars due to labile oral intake, monitor on low-dose sliding scale  CBG (last 3)  Recent Labs    09/08/20 2203 09/09/20 0738 09/09/20 1150  GLUCAP 192* 222* 175*      Pressure injury on the back.  Pressure Injury 08/21/20 Coccyx Right;Left;Mid Stage 2 -  Partial thickness loss of dermis presenting as a shallow open injury with a red, pink wound bed without slough. (Active)  08/21/20 2000  Location: Coccyx  Location Orientation: Right;Left;Mid  Staging: Stage 2 -  Partial thickness loss of dermis presenting as a shallow open injury with a red, pink wound bed without slough.  Wound Description (Comments):   Present on Admission:      Pressure Injury 08/24/20 Coccyx Medial Stage 2 -  Partial thickness loss of dermis presenting as a shallow open injury with a red, pink wound bed without slough. pink open (Active)  08/24/20 0910  Location: Coccyx  Location Orientation: Medial  Staging: Stage 2 -  Partial thickness loss of dermis presenting as a shallow open injury with a red, pink wound bed without slough.  Wound Description (Comments): pink open  Present on Admission: No   Wound care   consulted.    Failure to thrive - Dietary will be consulted, PT -OT   In view of multiple comorbidities, clinical deterioration, hemodialysis dependent, AKI, poor progression palliative care consulted for goals of care discussion.    DVT prophylaxis: [place] heparin Code Status: DNR - previous discussed with patient in detail on 08/28/2020 wants to be DNR, does not want a feeding tube Family Communication:  D/W son [name] by phone 7/19 Disposition:   Status is: Inpatient  Remains inpatient  appropriate because:Ongoing diagnostic testing needed not appropriate for outpatient work up and Unsafe d/c plan  Dispo: The patient is from: Home              Anticipated d/c is to: SNF              Patient currently is medically stable to d/c.   Awaiting bed availability at SNF, [facility]   Difficult to place patient No   Consultants:  Renal, Oncology, Pall Care  Procedures: none.   Antimicrobials:  Antibiotics Given (last 72 hours)     Date/Time Action Medication Dose Rate   09/07/20 1608 New Bag/Given   cefTRIAXone (ROCEPHIN) 1 g in sodium chloride 0.9 % 100 mL IVPB 1 g 200 mL/hr   09/08/20 1216 New Bag/Given   cefTRIAXone (ROCEPHIN) 1 g in sodium chloride 0.9 % 100 mL IVPB 1 g 200 mL/hr   09/09/20 1228 New Bag/Given   cefTRIAXone (ROCEPHIN) 1 g in sodium chloride 0.9 % 100 mL IVPB 1 g 200 mL/hr         Objective: Vitals:   09/09/20 0000 09/09/20 0435 09/09/20 0743 09/09/20 1153  BP: 125/75 120/70 115/69 121/68  Pulse: 92 90 98  99  Resp: $Remo'18 18 16 18  'wibRt$ Temp: 98.2 F (36.8 C) 98.1 F (36.7 C) 98.4 F (36.9 C) 98.2 F (36.8 C)  TempSrc: Oral Oral Oral Oral  SpO2: 98% 98% 98% 99%  Weight:      Height:        Intake/Output Summary (Last 24 hours) at 09/09/2020 1541 Last data filed at 09/09/2020 1441 Gross per 24 hour  Intake 490 ml  Output 1850 ml  Net -1360 ml   Filed Weights   09/06/20 0437 09/07/20 0500 09/08/20 0500  Weight: 63.7 kg 62.3 kg 61.6 kg    Examination:   Awake Alert, Oriented X 3, frail, deconditioned. Symmetrical Chest wall movement, Good air movement bilaterally, CTAB RRR,No Gallops,Rubs or new Murmurs, No Parasternal Heave +ve B.Sounds, Abd Soft, No tenderness, No rebound - guarding or rigidity. No Cyanosis, Clubbing or edema, No new Rash or bruise         Data Reviewed: I have personally reviewed following labs and imaging studies  CBC: Recent Labs  Lab 09/03/20 0054 09/04/20 1405 09/06/20 0314  09/07/20 0335 09/08/20 0101 09/09/20 0204  WBC 4.7 3.7* 4.5 5.5 6.2 5.5  NEUTROABS 2.7  --   --   --   --   --   HGB 8.5* 8.1* 7.4* 7.4* 8.2* 8.1*  HCT 25.9* 25.1* 23.2* 22.6* 25.0* 24.8*  MCV 93.5 95.8 96.7 96.2 95.1 96.5  PLT 189 195 196 194 215 767    Basic Metabolic Panel: Recent Labs  Lab 09/03/20 0054 09/04/20 0058 09/05/20 0116 09/06/20 0314 09/07/20 0335 09/08/20 0101 09/09/20 0204  NA 133* 132* 133* 136 133* 134* 133*  K 3.8 3.6 3.2* 3.2* 3.7 3.5 3.4*  CL 99 96* 99 99 97* 99 97*  CO2 $Re'25 25 26 29 29 26 26  'PIK$ GLUCOSE 152* 130* 122* 137* 222* 147* 274*  BUN 60* 63* 49* 52* 53* 48* 51*  CREATININE 4.67* 4.72* 3.84* 3.90* 3.73* 3.59* 3.58*  CALCIUM 8.6* 9.0 8.4* 8.6* 8.5* 8.8* 9.2  MG 1.9  --   --   --   --   --   --   PHOS  --  4.2 2.7 2.4* 2.1* 2.8  --     GFR: Estimated Creatinine Clearance: 15.8 mL/min (A) (by C-G formula based on SCr of 3.58 mg/dL (H)).  Liver Function Tests: Recent Labs  Lab 09/03/20 0054 09/04/20 0058 09/05/20 0116 09/06/20 0314 09/07/20 0335 09/08/20 0101  AST 23  --   --   --   --   --   ALT 34  --   --   --   --   --   ALKPHOS 62  --   --   --   --   --   BILITOT 0.3  --   --   --   --   --   PROT 7.3  --   --   --   --   --   ALBUMIN 2.3* 2.4* 2.5* 2.4* 2.3* 2.6*    CBG: Recent Labs  Lab 09/08/20 1135 09/08/20 1612 09/08/20 2203 09/09/20 0738 09/09/20 1150  GLUCAP 249* 136* 192* 222* 175*     Recent Results (from the past 240 hour(s))  Culture, Urine     Status: Abnormal   Collection Time: 09/01/20 10:55 AM   Specimen: Urine, Clean Catch  Result Value Ref Range Status   Specimen Description URINE, CLEAN CATCH  Final   Special Requests   Final  NONE Performed at [facility], [place] [address], [place],  [address]    Culture >=100,000 COLONIES/mL ENTEROBACTER AEROGENES (A)  Final   Report Status 09/03/2020 FINAL  Final   Organism ID, Bacteria ENTEROBACTER AEROGENES (A)  Final      Susceptibility    Enterobacter aerogenes - MIC*    CEFAZOLIN >=64 RESISTANT Resistant     CEFEPIME <=0.12 SENSITIVE Sensitive     CEFTRIAXONE <=0.25 SENSITIVE Sensitive     CIPROFLOXACIN <=0.25 SENSITIVE Sensitive     GENTAMICIN <=1 SENSITIVE Sensitive     IMIPENEM 2 SENSITIVE Sensitive     NITROFURANTOIN 64 INTERMEDIATE Intermediate     TRIMETH/SULFA <=20 SENSITIVE Sensitive     PIP/TAZO <=4 SENSITIVE Sensitive     * >=100,000 COLONIES/mL ENTEROBACTER AEROGENES  Urine Culture     Status: Abnormal   Collection Time: 09/04/20 11:58 AM   Specimen: Urine, Catheterized  Result Value Ref Range Status   Specimen Description URINE, CATHETERIZED  Final   Special Requests   Final    NONE Performed at [facility], [place] [address] [place], [place] [address]    Culture 40,000 COLONIES/mL ENTEROBACTER AEROGENES (A)  Final   Report Status 09/06/2020 FINAL  Final   Organism ID, Bacteria ENTEROBACTER AEROGENES (A)  Final      Susceptibility   Enterobacter aerogenes - MIC*    CEFAZOLIN RESISTANT Resistant     CEFEPIME <=0.12 SENSITIVE Sensitive     CEFTRIAXONE <=0.25 SENSITIVE Sensitive     CIPROFLOXACIN <=0.25 SENSITIVE Sensitive     GENTAMICIN <=1 SENSITIVE Sensitive     IMIPENEM 1 SENSITIVE Sensitive     NITROFURANTOIN 64 INTERMEDIATE Intermediate     TRIMETH/SULFA <=20 SENSITIVE Sensitive     PIP/TAZO <=4 SENSITIVE Sensitive     * 40,000 COLONIES/mL ENTEROBACTER AEROGENES      Radiology Studies: No results found.   Scheduled Meds:  calcium carbonate  1,000 mg of elemental calcium Per Tube BID WC   chlorhexidine  15 mL Mouth Rinse BID   Chlorhexidine Gluconate Cloth  6 each Topical Q0600   Chlorhexidine Gluconate Cloth  6 each Topical Q0600   [START ON 09/14/2020] darbepoetin (ARANESP) injection - DIALYSIS  100 mcg Subcutaneous Q Mon-HD   feeding supplement  237 mL Oral TID BM   finasteride  5 mg Oral Daily   heparin injection (subcutaneous)  5,000 Units Subcutaneous Q8H   insulin aspart   0-5 Units Subcutaneous QHS   insulin aspart  0-9 Units Subcutaneous TID WC   lactose free nutrition  237 mL Oral TID WC   lidocaine  1 patch Transdermal Q24H   magic mouthwash  10 mL Oral TID   mouth rinse  15 mL Mouth Rinse q12n4p   midodrine  10 mg Oral BID WC   mirtazapine  15 mg Oral QHS   multivitamin with minerals  1 tablet Oral Daily   senna-docusate  1 tablet Oral BID   tamsulosin  0.4 mg Oral QHS   Continuous Infusions:  sodium chloride 250 mL (09/09/20 1226)   cefTRIAXone (ROCEPHIN)  IV 1 g (09/09/20 1228)     LOS: 29 days   Signature  Phillips Climes M.D on 09/09/2020 at 3:41 PM   -  To page go to [email or web address]

## 2020-09-09 NOTE — Plan of Care (Signed)
  Problem: Education: Goal: Knowledge of General Education information will improve Description Including pain rating scale, medication(s)/side effects and non-pharmacologic comfort measures Outcome: Progressing   Problem: Health Behavior/Discharge Planning: Goal: Ability to manage health-related needs will improve Outcome: Progressing   

## 2020-09-09 NOTE — Progress Notes (Signed)
Nutrition Follow-up  DOCUMENTATION CODES:   Non-severe (moderate) malnutrition in context of acute illness/injury  INTERVENTION:   -D/c Ensure Enlive po BID, each supplement provides 350 kcal and 20 grams of protein  -Boost Plus TID, each supplement provides 340 kcals and 14 grams protein -Continue Hormel Shake TID, each supplement provides 520 kcals and 22 grams protein -Continue MVI with minerals daily  NUTRITION DIAGNOSIS:   Moderate Malnutrition related to acute illness (COVID-19) as evidenced by mild fat depletion, mild muscle depletion, moderate muscle depletion.  Ongoing  GOAL:   Patient will meet greater than or equal to 90% of their needs  Progressing   MONITOR:   PO intake, Supplement acceptance, Labs, Weight trends, Skin, I & O's  REASON FOR ASSESSMENT:   Consult Enteral/tube feeding initiation and management  ASSESSMENT:   Pt with a PMH significant of multiple myeloma (currently undergoing chemo), DM, HTN, HLD, asthma, and OA admitted with hypokalemia, AKI as well as COVID-19 infection  6/29 - NGT placed (proximal duodenum) 7/2 - transferred to [facility], CRRT d/c 7/6 - cortrak tube dislodged and removed 7/7 - advanced to soft diet  7/18- last HD treatment 7/19- HD cath removed  Reviewed I/O's: -845 ml x 24 hours and -15.8 L since 08/26/20  UOP: 1.4 L x 24 hours  Spoke with pt at bedside, who was much more alert and interactive in comparison to previous visit. Pt shares that his appetite and swallow function have improved. Observed breakfast tray- pt consumed all of rice krispies and about 25% of Vital Cuisine shake. Pt also consumed 100% of Boost Plus supplements. Per pt, he likes the supplements, but prefers chocolate. Intake has been variable; noted meal completion 25-100%.   Discussed importance of good meal and supplement intake to promote healing.   Medications reviewed and include calcium carbonate, aranesp, remeron, and senokot.   Labs reviewed: Na:  133, K: 3.4, CBGS: [id number] (inpatient orders for glycemic control are 0-5 units insulin aspart daily at bedtime and 0-9 units insulin aspart TID with meals).    Diet Order:   Diet Order             Diet regular Room service appropriate? Yes with Assist; Fluid consistency: Thin  Diet effective now                   EDUCATION NEEDS:   Education needs have been addressed  Skin:  Skin Assessment: Skin Integrity Issues: Skin Integrity Issues:: Stage II Stage I: - Stage II: coccyx  Last BM:  09/06/20  Height:   Ht Readings from Last 1 Encounters:  08/11/20 $RemoveB'5\' 8"'nrFUyuzQ$  (1.727 m)    Weight:   Wt Readings from Last 1 Encounters:  09/08/20 61.6 kg    Ideal Body Weight:  70 kg  BMI:  Body mass index is 20.65 kg/m.  Estimated Nutritional Needs:   Kcal:  2000-2200  Protein:  120-135 grams  Fluid:  > 2 L    Jeff Rodriguez, RD, LDN, [place] Registered Dietitian II Certified Diabetes Care and Education Specialist Please refer to [facility] for RD and/or RD on-call/weekend/after hours pager

## 2020-09-09 NOTE — TOC Progression Note (Signed)
Transition of Care [facility]) - Progression Note    Patient Details  Name: DJIMON LUNDSTROM MRN: [id number] Date of Birth: May 27, 1945  Transition of Care [facility]) CM/SW [place], [place] Phone Number: 09/09/2020, 3:59 PM  Clinical Narrative:    CSW attempted to contact Melissa at [facility] in [place], [place] for possible SNF placement.  CSW has not received a return phone call after several attempts at 9:43am and 4:01 pm.  CSW spoke with pt's son [name] several times today to see if we made contact with the facility today.  Pt's son will attempt to reach out to facility.  TOC will continue to assist with disposition planning.     Expected Discharge Plan: Skilled Nursing Facility Barriers to Discharge: SNF Pending bed offer  Expected Discharge Plan and Services Expected Discharge Plan: [place]   Discharge Planning Services: CM Consult Post Acute Care Choice: [place] Living arrangements for the past 2 months: Single Family Home                                       Social Determinants of Health (SDOH) Interventions    Readmission Risk Interventions No flowsheet data found.

## 2020-09-09 NOTE — Progress Notes (Signed)
Updated patient's son, [name], over the telephone with the patient's permission.

## 2020-09-09 NOTE — Plan of Care (Signed)
Patient is awaiting SNF placement.    Problem: Education: Goal: Knowledge of General Education information will improve Description: Including pain rating scale, medication(s)/side effects and non-pharmacologic comfort measures Outcome: Progressing   Problem: Health Behavior/Discharge Planning: Goal: Ability to manage health-related needs will improve Outcome: Progressing   Problem: Clinical Measurements: Goal: Ability to maintain clinical measurements within normal limits will improve Outcome: Progressing Goal: Will remain free from infection Outcome: Progressing Goal: Diagnostic test results will improve Outcome: Progressing Goal: Respiratory complications will improve Outcome: Progressing Goal: Cardiovascular complication will be avoided Outcome: Progressing   Problem: Activity: Goal: Risk for activity intolerance will decrease Outcome: Progressing   Problem: Nutrition: Goal: Adequate nutrition will be maintained Outcome: Progressing   Problem: Coping: Goal: Level of anxiety will decrease Outcome: Progressing   Problem: Elimination: Goal: Will not experience complications related to bowel motility Outcome: Progressing Goal: Will not experience complications related to urinary retention Outcome: Progressing   Problem: Pain Managment: Goal: General experience of comfort will improve Outcome: Progressing   Problem: Safety: Goal: Ability to remain free from injury will improve Outcome: Progressing   Problem: Skin Integrity: Goal: Risk for impaired skin integrity will decrease Outcome: Progressing   Problem: Education: Goal: Knowledge of risk factors and measures for prevention of condition will improve Outcome: Progressing   Problem: Coping: Goal: Psychosocial and spiritual needs will be supported Outcome: Progressing   Problem: Respiratory: Goal: Will maintain a patent airway Outcome: Progressing Goal: Complications related to the disease process,  condition or treatment will be avoided or minimized Outcome: Progressing   Problem: Education: Goal: Knowledge of disease and its progression will improve Outcome: Progressing Goal: Individualized Educational Video(s) Outcome: Progressing   Problem: Fluid Volume: Goal: Compliance with measures to maintain balanced fluid volume will improve Outcome: Progressing   Problem: Health Behavior/Discharge Planning: Goal: Ability to manage health-related needs will improve Outcome: Progressing   Problem: Nutritional: Goal: Ability to make healthy dietary choices will improve Outcome: Progressing   Problem: Clinical Measurements: Goal: Complications related to the disease process, condition or treatment will be avoided or minimized Outcome: Progressing

## 2020-09-10 LAB — BASIC METABOLIC PANEL
Anion gap: 12 (ref 5–15)
BUN: 54 mg/dL — ABNORMAL HIGH (ref 8–23)
CO2: 25 mmol/L (ref 22–32)
Calcium: 9.2 mg/dL (ref 8.9–10.3)
Chloride: 95 mmol/L — ABNORMAL LOW (ref 98–111)
Creatinine, Ser: 3.4 mg/dL — ABNORMAL HIGH (ref 0.61–1.24)
GFR, Estimated: 18 mL/min — ABNORMAL LOW (ref >60)
Glucose, Bld: 254 mg/dL — ABNORMAL HIGH (ref 70–99)
Potassium: 3.8 mmol/L (ref 3.5–5.1)
Sodium: 132 mmol/L — ABNORMAL LOW (ref 135–145)

## 2020-09-10 LAB — CBC
HCT: 24.1 % — ABNORMAL LOW (ref 39.0–52.0)
Hemoglobin: 8 g/dL — ABNORMAL LOW (ref 13.0–17.0)
MCH: 31.6 pg (ref 26.0–34.0)
MCHC: 33.2 g/dL (ref 30.0–36.0)
MCV: 95.3 fL (ref 80.0–100.0)
Platelets: 210 10*3/uL (ref 150–400)
RBC: 2.53 MIL/uL — ABNORMAL LOW (ref 4.22–5.81)
RDW: 16.8 % — ABNORMAL HIGH (ref 11.5–15.5)
WBC: 6.6 10*3/uL (ref 4.0–10.5)
nRBC: 0 % (ref 0.0–0.2)

## 2020-09-10 LAB — GLUCOSE, CAPILLARY
Glucose-Capillary: 159 mg/dL — ABNORMAL HIGH (ref 70–99)
Glucose-Capillary: 223 mg/dL — ABNORMAL HIGH (ref 70–99)
Glucose-Capillary: 182 mg/dL — ABNORMAL HIGH (ref 70–99)
Glucose-Capillary: 138 mg/dL — ABNORMAL HIGH (ref 70–99)

## 2020-09-10 MED ORDER — SENNOSIDES-DOCUSATE SODIUM 8.6-50 MG PO TABS
2.0000 | ORAL_TABLET | Freq: Two times a day (BID) | ORAL | Status: DC
  Administered 2020-09-10 – 2020-09-11 (×2): 2 via ORAL
  Filled 2020-09-10 (×4): qty 2

## 2020-09-10 MED ORDER — CALCIUM CARBONATE 1250 (500 CA) MG PO TABS: 1000.0000 mg | ORAL_TABLET | Freq: Two times a day (BID) | ORAL | Status: DC

## 2020-09-10 MED ORDER — POLYETHYLENE GLYCOL 3350 17 G PO PACK
17.0000 g | PACK | Freq: Every day | ORAL | Status: DC | PRN
  Administered 2020-09-10: 17 g via ORAL
  Filled 2020-09-10: qty 1

## 2020-09-10 MED ORDER — BISACODYL 10 MG RE SUPP
10.0000 mg | Freq: Once | RECTAL | Status: AC
  Administered 2020-09-10: 10 mg via RECTAL
  Filled 2020-09-10: qty 1

## 2020-09-10 MED ORDER — CALCIUM CARBONATE 1250 (500 CA) MG PO TABS
1000.0000 mg | ORAL_TABLET | Freq: Two times a day (BID) | ORAL | Status: DC
  Administered 2020-09-10 – 2020-09-11 (×4): 1000 mg via ORAL
  Filled 2020-09-10 (×4): qty 1

## 2020-09-10 MED ORDER — SORBITOL 70 % SOLN
300.0000 mL | TOPICAL_OIL | Freq: Once | ORAL | Status: AC
  Administered 2020-09-10: 300 mL via RECTAL
  Filled 2020-09-10: qty 90

## 2020-09-10 NOTE — TOC Progression Note (Addendum)
Transition of Care [facility]) - Progression Note    Patient Details  Name: Jeff Rodriguez MRN: [id number] Date of Birth: [birth year]  Transition of Care [facility]) CM/SW [place], [place] Phone Number: 09/10/2020, 1:24 PM  Clinical Narrative:    [facility] admission director stated "insurance auth last day was yesterday 09/09/20.  Facility will start another auth with [facility] [place].  According to [name] pt's last 4 numbers of SS is inaccurate. CSW has not received a return phone call from [facility] in [place], [place].  CSW spoke with pt's son concerning not being able to contact [facility].  CSW gave pt's son local SNF choices [facility] and [facility]. Waiting on return phone from [facility]. CSW will update pt's son on insurance auth and SNF.  TOC will continue to assist with disposition planning.   Expected Discharge Plan: Skilled Nursing Facility Barriers to Discharge: SNF Pending bed offer  Expected Discharge Plan and Services Expected Discharge Plan: [place]   Discharge Planning Services: CM Consult Post Acute Care Choice: [place] Living arrangements for the past 2 months: Single Family Home Expected Discharge Date: 09/10/20                                     Social Determinants of Health (SDOH) Interventions    Readmission Risk Interventions No flowsheet data found.

## 2020-09-10 NOTE — Progress Notes (Signed)
PT Cancellation Note  Patient Details Name: Jeff Rodriguez MRN: [id number] DOB: Feb 17, 1946   Cancelled Treatment:    Reason Eval/Treat Not Completed: Other (comment) unavailable for PT, getting bathed by nursing staff. Will attempt to return later if time/schedule allow.    Windell Norfolk, DPT, PN1   Supplemental Physical Therapist [facility]    Pager [phone] Acute Rehab Office [phone]

## 2020-09-10 NOTE — Progress Notes (Addendum)
Subjective:   Doing well today. Complaining of constipation but no abd pain N/V. Appetite is improving. UOP of 1.7 L. Kidney function improving slowly-  good UOP.   Objective Vital signs in last 24 hours: Vitals:   09/09/20 1937 09/09/20 2343 09/10/20 0355 09/10/20 0753  BP: 119/74 119/73 118/71 109/73  Pulse: (!) 105 100 86 91  Resp: $Remo'16 15 17 16  'klFzR$ Temp: 98.2 F (36.8 C) 98.3 F (36.8 C) 98.5 F (36.9 C) 98.7 F (37.1 C)  TempSrc: Axillary Axillary Axillary Oral  SpO2: 99% 98% 99% 99%  Weight:      Height:       Weight change:   Intake/Output Summary (Last 24 hours) at 09/10/2020 1054 Last data filed at 09/10/2020 0849 Gross per 24 hour  Intake 300 ml  Output 1700 ml  Net -1400 ml    Assessment/ Plan: Pt is a 75 y.o. yo male with PMH of multiple myeloma who was admitted on 08/11/2020 with Covid19 and found to have acute on chronic renal failure.   Assessment/Plan: 1. AKI on CKD2 - Likely 2/2 ATN from hypotension/ACEi. Baseline sCr around 1.7 around 02/2020. Presented with sCr of 7.3. Kidney function improving slowly (sCr 3.59->3.58->3.40). Hypokalemia resolved. Mild constipation but no nausea/vomiting. Making appropriate urine with UOP of 1.7 L with net I/O of -1.4 L over last 24 hrs. Net I/O of 9.6 L since admission. Continue to monitor for gradual improvement.   --Continue to encouraging increased po intake  --Trend BMP, electrolytes, every other day  --Strict I/Os, daily weight  --D/c foley, trial of void  --Still pending SNF placement 2. HTN/volume: Improved. BP of 110-110/70s. Continue midodrine 10 mg BID 3. Anemia: S/p 1 unit pRBC. Hgb stable at 8.0. Continue to monitor closely. Iron stores OK-  have him on aranesp. Lab holiday every other day.  4. Malnutrition: Weight down to 136 lbs. Previously around 150-160 before hospitalization, per patient. Continue Regular diet and nutritional supplements. 5. Hypocalcemia: Asymptomatic. Albumin (2.3->2.6) and calcium (8.5->8.8)  trending up.  6. Hx of multiple myeloma: Stable. Negative bone scan.  7. Pancytopenia: management per hem/onc 8. Covid19 infection: No respiratory symptoms. Completed course Remdesivir.   Dispo: Pending SNF placement. We will work on establishing him with a nephrologist after a SNF is found.   Lacinda Axon   Patient seen and examined, agree with above note with above modifications. Pt is doing well clinically - is sick of being here.  Renal function is trending down very slowly-  no real dialysis since the beginning of the month.  Are going to try and remove the foley as we wait for disposition-  also have him on aranesp for anemia related to CKD  Corliss Parish, MD 09/10/2020       Labs: Basic Metabolic Panel: Recent Labs  Lab 09/06/20 0314 09/07/20 0335 09/08/20 0101 09/09/20 0204 09/10/20 0333  NA 136 133* 134* 133* 132*  K 3.2* 3.7 3.5 3.4* 3.8  CL 99 97* 99 97* 95*  CO2 $Re'29 29 26 26 25  'sNT$ GLUCOSE 137* 222* 147* 274* 254*  BUN 52* 53* 48* 51* 54*  CREATININE 3.90* 3.73* 3.59* 3.58* 3.40*  CALCIUM 8.6* 8.5* 8.8* 9.2 9.2  PHOS 2.4* 2.1* 2.8  --   --    Liver Function Tests: Recent Labs  Lab 09/06/20 0314 09/07/20 0335 09/08/20 0101  ALBUMIN 2.4* 2.3* 2.6*   No results for input(s): LIPASE, AMYLASE in the last 168 hours. No results for input(s): AMMONIA in the  last 168 hours. CBC: Recent Labs  Lab 09/06/20 0314 09/07/20 0335 09/08/20 0101 09/09/20 0204 09/10/20 0333  WBC 4.5 5.5 6.2 5.5 6.6  HGB 7.4* 7.4* 8.2* 8.1* 8.0*  HCT 23.2* 22.6* 25.0* 24.8* 24.1*  MCV 96.7 96.2 95.1 96.5 95.3  PLT 196 194 215 228 210   Cardiac Enzymes: No results for input(s): CKTOTAL, CKMB, CKMBINDEX, TROPONINI in the last 168 hours. CBG: Recent Labs  Lab 09/08/20 2203 09/09/20 0738 09/09/20 1150 09/09/20 1626 09/09/20 2031  GLUCAP 192* 222* 175* 338* 171*    Iron Studies:  No results for input(s): IRON, TIBC, TRANSFERRIN, FERRITIN in the last 72  hours.  Studies/Results: No results found. Medications: Infusions:  sodium chloride 250 mL (09/09/20 1226)   cefTRIAXone (ROCEPHIN)  IV Stopped (09/09/20 1258)    Scheduled Medications:  calcium carbonate  1,000 mg of elemental calcium Per Tube BID WC   chlorhexidine  15 mL Mouth Rinse BID   Chlorhexidine Gluconate Cloth  6 each Topical Q0600   Chlorhexidine Gluconate Cloth  6 each Topical Q0600   [START ON 09/14/2020] darbepoetin (ARANESP) injection - DIALYSIS  100 mcg Subcutaneous Q Mon-HD   feeding supplement  237 mL Oral TID BM   finasteride  5 mg Oral Daily   heparin injection (subcutaneous)  5,000 Units Subcutaneous Q8H   insulin aspart  0-5 Units Subcutaneous QHS   insulin aspart  0-9 Units Subcutaneous TID WC   lactose free nutrition  237 mL Oral TID WC   lidocaine  1 patch Transdermal Q24H   magic mouthwash  10 mL Oral TID   mouth rinse  15 mL Mouth Rinse q12n4p   midodrine  10 mg Oral BID WC   mirtazapine  15 mg Oral QHS   multivitamin with minerals  1 tablet Oral Daily   senna-docusate  1 tablet Oral BID   tamsulosin  0.4 mg Oral QHS    have reviewed scheduled and prn medications.  Physical Exam: General: Pleasant, chronically-ill, cachetic elderly man in bed. NAD Heart: RRR. No m/r/g. No LE edema. Lungs: Lungs CTAB. Decreased air movement at the bases. No wheezing or rales.  Abdomen: Soft. NT/ND. Normal bowel sounds Extremities: Normal ROM. No edema Neuro: A&Ox3. No focal deficits   09/10/2020,7:45 AM  LOS: 30 days

## 2020-09-10 NOTE — Progress Notes (Signed)
PROGRESS NOTE    Jeff Rodriguez  ERD:[id number] DOB: [birth year] DOA: 08/11/2020 PCP: [facility]    Chief Complaint  Patient presents with   Cough    Brief Narrative:    75 y.o. male with medical history significant of multiple myeloma (on [name]), diabetes, hypertension, asthma, hyperlipidemia and osteoarthritis who was brought in by EMS secondary to productive cough, poor oral intake and not eating much.  Patient is currently undergoing chemo for his multiple myeloma. Tested positive for COVID 19, found to be hypokalemic and  in acute renal failure.  Nephrology team following.  in view of severe thrombocytopenia, oncology consulted and neupogen given. Bone scan 08/18/2020 negative for lytic lesions.  Due to poor prognosis, palliative care team consulted and patient decided for CRRT.  Per nephrology patient was transferred to [facility] for HD.  His anemia, thrombocytopenia is improved, renal function is gradually improving, but patient with very poor appetite, which which was thought at one point may be related to uremia, and patient has been considered on and off for HD over the last 2 weeks, final decision has been made on 7/18 where patient will not need HD no more, where his temporary hemodialysis has been discontinued 7/19 .   Subjective:    Patient was seen and examined at his bedside.  He is alert and oriented x4 he has no new complaints.  Foley catheter has been removed for voiding trial.  Awaiting bed placement at this time.   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   -Patient with poor oral intake,  and nausea, this felt to be secondary to uremia, she has been considered for HD multiple times, HD attempted 7/15, has stopped after 30 minutes given soft blood pressure. -As discussed with renal, patient unlikely will need any further hemodialysis, so can discontinue his temporary hemodialysis catheter, it will be discontinued today. -Continue to monitor BMP daily. -Good urine output over last 24 hours with 1.7 L, with net in and out of 1.1 L.  Multiple myeloma  - Dr. Ennever/oncology on board. Bone scan does not show any lytic lesions at this time. -Patient on [name], which is on hold due to renal sufficiency.  Urinary retention -800 cc on bladder scan 7/14, Foley catheter inserted, urine is very cloudy, UA was obtained, showing pyuria, continue with Rocephin, urine culture growing Enterobacter aerogenes, will treat for 7 days. Start date 7/15. Foley catheter removed on 09/10/2020.  Ongoing voiding trial.  Anemia of chronic disease  -continue to monitor closely and transfuse for Hgb <7.  Lab Results  Component Value Date   WBC 6.6 09/10/2020   HGB 8.0 (L) 09/10/2020   HCT 24.1 (L) 09/10/2020   MCV 95.3 09/10/2020   PLT 210 09/10/2020    Underlying depression and oral intake being poor for several weeks according to the son/failure to thrive  not suicidal homicidal.  Placed on Remeron and monitor.   -Encourage oral intake  Severe thrombocytopenia  - due to multiple myeloma counts much improved.  Acute metabolic encephalopathy  -All mentation has improved, patient is answering questions appropriately ,. Initial  CT of the head is negative for acute intracranial abnormalities.TSH, vitamin B12 normal, ammonia level within normal limits, this problem is almost completely resolved.  COVID 19 virus infection. Completed the course of remdesivir, continue with flutter valve and incentive spirometer and inhalers as needed. Therapy evaluations recommending SNF. He is off  isolation.  Hypokalemia and hypomagnesemia.  Both replaced.  Hypertension: Well controlled.   Hyperlipidemia - resume home dose statin once acute issues better.  Severe PCM.  He had NG tube with tube feeds, NG accidentally pulled out on 08/26/2020, monitor on oral protein supplements and oral diet.  Appetite remains poor  Type 2 diabetes mellitus -labile sugars due to labile oral intake, monitor on low-dose sliding scale  CBG (last 3)  Recent Labs    09/09/20 1626 09/09/20 2031 09/10/20 0756  GLUCAP 338* 171* 159*      Pressure injury on the back.  Pressure Injury 08/21/20 Coccyx Right;Left;Mid Stage 2 -  Partial thickness loss of dermis presenting as a shallow open injury with a red, pink wound bed without slough. (Active)  08/21/20 2000  Location: Coccyx  Location Orientation: Right;Left;Mid  Staging: Stage 2 -  Partial thickness loss of dermis presenting as a shallow open injury with a red, pink wound bed without slough.  Wound Description (Comments):   Present on Admission:      Pressure Injury 08/24/20 Coccyx Medial Stage 2 -  Partial thickness loss of dermis presenting as a shallow open injury with a red, pink wound bed without slough. pink open (Active)  08/24/20 0910  Location: Coccyx  Location Orientation: Medial  Staging: Stage 2 -  Partial thickness loss of dermis presenting as a shallow open injury with a red, pink wound bed without slough.  Wound Description (Comments): pink open  Present on Admission: No   Wound care   consulted.    Failure to thrive - Dietary will be consulted, PT -OT   In view of multiple comorbidities, clinical deterioration, hemodialysis dependent, AKI, poor progression palliative care consulted for goals of care discussion.    DVT prophylaxis: [place] heparin Code Status: DNR - previous discussed with patient in detail on 08/28/2020 wants to be DNR, does not want a feeding tube Family Communication:  D/W son [name] by phone  7/19 Disposition:   Status is: Inpatient  Remains inpatient appropriate because:Ongoing diagnostic testing needed not appropriate for outpatient work up and Unsafe d/c plan  Dispo: The patient is from: Home              Anticipated d/c is to: SNF              Patient currently is medically stable to d/c.   Awaiting bed availability at SNF, [facility]   Difficult to place patient No   Consultants:  Renal, Oncology, Pall Care  Procedures: none.   Antimicrobials:  Antibiotics Given (last 72 hours)     Date/Time Action Medication Dose Rate   09/07/20 1608 New Bag/Given   cefTRIAXone (ROCEPHIN) 1 g in sodium chloride 0.9 % 100 mL IVPB 1 g 200 mL/hr   09/08/20 1216 New Bag/Given   cefTRIAXone (ROCEPHIN) 1 g in sodium chloride 0.9 % 100 mL IVPB 1 g 200 mL/hr   09/09/20 1228 New Bag/Given   cefTRIAXone (ROCEPHIN) 1 g in sodium chloride 0.9 % 100 mL IVPB 1 g 200 mL/hr         Objective: Vitals:   09/09/20 1937 09/09/20 2343 09/10/20 0355 09/10/20 4268  BP: 119/74 119/73 118/71 109/73  Pulse: (!) 105 100 86 91  Resp: $Remo'16 15 17 16  'cuoDG$ Temp: 98.2 F (36.8 C) 98.3 F (36.8 C) 98.5 F (36.9 C) 98.7 F (37.1 C)  TempSrc: Axillary Axillary Axillary Oral  SpO2: 99% 98% 99% 99%  Weight:      Height:        Intake/Output Summary (Last 24 hours) at 09/10/2020 0851 Last data filed at 09/10/2020 0849 Gross per 24 hour  Intake 550 ml  Output 2300 ml  Net -1750 ml   Filed Weights   09/06/20 0437 09/07/20 0500 09/08/20 0500  Weight: 63.7 kg 62.3 kg 61.6 kg    Examination:   General: Awake alert and oriented x3.  No acute distress. Pulmonary: Symmetrical chest movement.  Lungs clear to auscultation.  Good inspiratory effort.   Cardiac: Regular rate and rhythm no rubs or gallops.  No JVD or thyromegaly noted. GI: Bowel sounds present.  Nontender and nondistended.   Musculoskeletal: No cyanosis clubbing or edema. Skin: No rashes noted.      Data  Reviewed: I have personally reviewed following labs and imaging studies  CBC: Recent Labs  Lab 09/06/20 0314 09/07/20 0335 09/08/20 0101 09/09/20 0204 09/10/20 0333  WBC 4.5 5.5 6.2 5.5 6.6  HGB 7.4* 7.4* 8.2* 8.1* 8.0*  HCT 23.2* 22.6* 25.0* 24.8* 24.1*  MCV 96.7 96.2 95.1 96.5 95.3  PLT 196 194 215 228 167    Basic Metabolic Panel: Recent Labs  Lab 09/04/20 0058 09/05/20 0116 09/06/20 0314 09/07/20 0335 09/08/20 0101 09/09/20 0204 09/10/20 0333  NA 132* 133* 136 133* 134* 133* 132*  K 3.6 3.2* 3.2* 3.7 3.5 3.4* 3.8  CL 96* 99 99 97* 99 97* 95*  CO2 $Re'25 26 29 29 26 26 25  'HUK$ GLUCOSE 130* 122* 137* 222* 147* 274* 254*  BUN 63* 49* 52* 53* 48* 51* 54*  CREATININE 4.72* 3.84* 3.90* 3.73* 3.59* 3.58* 3.40*  CALCIUM 9.0 8.4* 8.6* 8.5* 8.8* 9.2 9.2  PHOS 4.2 2.7 2.4* 2.1* 2.8  --   --     GFR: Estimated Creatinine Clearance: 16.6 mL/min (A) (by C-G formula based on SCr of 3.4 mg/dL (H)).  Liver Function Tests: Recent Labs  Lab 09/04/20 0058 09/05/20 0116 09/06/20 0314 09/07/20 0335 09/08/20 0101  ALBUMIN 2.4* 2.5* 2.4* 2.3* 2.6*    CBG: Recent Labs  Lab 09/09/20 0738 09/09/20 1150 09/09/20 1626 09/09/20 2031 09/10/20 0756  GLUCAP 222* 175* 338* 171* 159*     Recent Results (from the past 240 hour(s))  Culture, Urine     Status: Abnormal   Collection Time: 09/01/20 10:55 AM   Specimen: Urine, Clean Catch  Result Value Ref Range Status   Specimen Description URINE, CLEAN CATCH  Final   Special Requests   Final    NONE Performed at [facility], [place] [address], [place], [place] [address]    Culture >=100,000 COLONIES/mL ENTEROBACTER AEROGENES (A)  Final   Report Status 09/03/2020 FINAL  Final   Organism ID, Bacteria ENTEROBACTER AEROGENES (A)  Final      Susceptibility   Enterobacter aerogenes - MIC*    CEFAZOLIN >=64 RESISTANT Resistant     CEFEPIME <=0.12 SENSITIVE Sensitive     CEFTRIAXONE <=0.25 SENSITIVE Sensitive     CIPROFLOXACIN  <=0.25 SENSITIVE Sensitive     GENTAMICIN <=1 SENSITIVE Sensitive     IMIPENEM 2 SENSITIVE Sensitive     NITROFURANTOIN 64 INTERMEDIATE Intermediate     TRIMETH/SULFA <=  20 SENSITIVE Sensitive     PIP/TAZO <=4 SENSITIVE Sensitive     * >=100,000 COLONIES/mL ENTEROBACTER AEROGENES  Urine Culture     Status: Abnormal   Collection Time: 09/04/20 11:58 AM   Specimen: Urine, Catheterized  Result Value Ref Range Status   Specimen Description URINE, CATHETERIZED  Final   Special Requests   Final    NONE Performed at [facility], [place] [address] [place], [place] [address]    Culture 40,000 COLONIES/mL ENTEROBACTER AEROGENES (A)  Final   Report Status 09/06/2020 FINAL  Final   Organism ID, Bacteria ENTEROBACTER AEROGENES (A)  Final      Susceptibility   Enterobacter aerogenes - MIC*    CEFAZOLIN RESISTANT Resistant     CEFEPIME <=0.12 SENSITIVE Sensitive     CEFTRIAXONE <=0.25 SENSITIVE Sensitive     CIPROFLOXACIN <=0.25 SENSITIVE Sensitive     GENTAMICIN <=1 SENSITIVE Sensitive     IMIPENEM 1 SENSITIVE Sensitive     NITROFURANTOIN 64 INTERMEDIATE Intermediate     TRIMETH/SULFA <=20 SENSITIVE Sensitive     PIP/TAZO <=4 SENSITIVE Sensitive     * 40,000 COLONIES/mL ENTEROBACTER AEROGENES      Radiology Studies: No results found.   Scheduled Meds:  calcium carbonate  1,000 mg of elemental calcium Oral BID WC   chlorhexidine  15 mL Mouth Rinse BID   Chlorhexidine Gluconate Cloth  6 each Topical Q0600   [START ON 09/14/2020] darbepoetin (ARANESP) injection - DIALYSIS  100 mcg Subcutaneous Q Mon-HD   feeding supplement  237 mL Oral TID BM   finasteride  5 mg Oral Daily   heparin injection (subcutaneous)  5,000 Units Subcutaneous Q8H   insulin aspart  0-5 Units Subcutaneous QHS   insulin aspart  0-9 Units Subcutaneous TID WC   lactose free nutrition  237 mL Oral TID WC   lidocaine  1 patch Transdermal Q24H   magic mouthwash  10 mL Oral TID   mouth rinse  15 mL Mouth Rinse  q12n4p   midodrine  10 mg Oral BID WC   mirtazapine  15 mg Oral QHS   multivitamin with minerals  1 tablet Oral Daily   senna-docusate  1 tablet Oral BID   tamsulosin  0.4 mg Oral QHS   Continuous Infusions:  sodium chloride 250 mL (09/09/20 1226)   cefTRIAXone (ROCEPHIN)  IV Stopped (09/09/20 1258)     LOS: 30 days   Signature  Kayleen Memos M.D on 09/10/2020 at 8:51 AM   -  To page go to [email or web address]

## 2020-09-11 LAB — RESP PANEL BY RT-PCR (FLU A&B, COVID) ARPGX2
Influenza A by PCR: NEGATIVE
Influenza B by PCR: NEGATIVE
SARS Coronavirus 2 by RT PCR: POSITIVE — AB

## 2020-09-11 LAB — GLUCOSE, CAPILLARY
Glucose-Capillary: 140 mg/dL — ABNORMAL HIGH (ref 70–99)
Glucose-Capillary: 289 mg/dL — ABNORMAL HIGH (ref 70–99)
Glucose-Capillary: 159 mg/dL — ABNORMAL HIGH (ref 70–99)
Glucose-Capillary: 305 mg/dL — ABNORMAL HIGH (ref 70–99)

## 2020-09-11 MED ORDER — POLYETHYLENE GLYCOL 3350 17 G PO PACK: 17.0000 g | PACK | Freq: Every day | ORAL | 0 refills | Status: AC | PRN

## 2020-09-11 MED ORDER — MIDODRINE HCL 10 MG PO TABS: 10.0000 mg | ORAL_TABLET | Freq: Two times a day (BID) | ORAL | 0 refills | Status: AC

## 2020-09-11 MED ORDER — GLIPIZIDE 5 MG PO TABS
2.5000 mg | ORAL_TABLET | Freq: Every day | ORAL | Status: DC
  Administered 2020-09-11: 2.5 mg via ORAL
  Filled 2020-09-11: qty 1

## 2020-09-11 MED ORDER — MIRTAZAPINE 15 MG PO TBDP: 15.0000 mg | ORAL_TABLET | Freq: Every day | ORAL | 0 refills | Status: AC

## 2020-09-11 MED ORDER — ADULT MULTIVITAMIN W/MINERALS CH: 1.0000 | ORAL_TABLET | Freq: Every day | ORAL | 0 refills | Status: AC

## 2020-09-11 MED ORDER — GLIPIZIDE 5 MG PO TABS: 2.5000 mg | ORAL_TABLET | Freq: Every day | ORAL | 0 refills | Status: AC

## 2020-09-11 NOTE — Progress Notes (Signed)
Report given to Scientist, physiological at [facility].

## 2020-09-11 NOTE — TOC Transition Note (Signed)
Transition of Care [facility]) - CM/SW Discharge Note   Patient Details  Name: Jeff Rodriguez MRN: [id number] Date of Birth: January 07, 1946  Transition of [facility] CM/SW Contact:  Benard Halsted, LCSW Phone Number: 09/11/2020, 3:39 PM   Clinical Narrative:    Patient will DC to: [facility] Anticipated DC date: 09/11/20 Family notified: Son, [name] Transport by: Corey Harold   Per MD patient ready for DC to [facility]. RN to call report prior to discharge [phone]). RN, patient, patient's family, and facility notified of DC. Discharge Summary and FL2 sent to facility. DC packet on chart. Ambulance transport requested for patient.   CSW will sign off for now as social work intervention is no longer needed. Please consult [name] again if new needs arise.     Final next level of care: Skilled Nursing Facility Barriers to Discharge: Barriers Resolved   Patient Goals and CMS Choice Patient states their goals for this hospitalization and ongoing recovery are:: Rehab CMS Medicare.gov Compare Post Acute Care list provided to:: Patient Represenative (must comment) Choice offered to / list presented to : Patient, Adult Children  Discharge Placement   Existing PASRR number confirmed : 09/11/20          Patient chooses bed at: [facility] Patient to be transferred to facility by: [facility] Name of family member notified: Son Patient and family notified of of transfer: 09/11/20  Discharge Plan and Services   Discharge Planning Services: CM Consult Post Acute Care Choice: [place]                               Social Determinants of Health (SDOH) Interventions     Readmission Risk Interventions Readmission Risk Prevention Plan 09/11/2020  Transportation Screening Complete  Medication Review (RN Care Manager) Referral to Pharmacy  PCP or Specialist appointment within 3-5 days of discharge Complete  HRI or Home Care Consult Complete  SW Recovery  Care/Counseling Consult Complete  [place] Complete  Some recent data might be hidden

## 2020-09-11 NOTE — TOC Progression Note (Addendum)
Transition of Care [facility]) - Progression Note    Patient Details  Name: Jeff Rodriguez MRN: [id number] Date of Birth: 1945-03-03  Transition of Care [facility]) CM/SW [place], LCSW Phone Number: 09/11/2020, 10:14 AM  Clinical Narrative:    10:14am-CSW did confirm that [facility] is unable to accept patient due to no beds available. Per [facility], they have left a message with the [facility] regarding the insurance approval. CSW also sent an email and left a vm to [employer] [phone]) requesting authorization information.   12:29pm-The [facility] emailed approval details to [place] and [facility]. CSW requested rapid COVID test (though may still be positive) and updated patient's son.    Expected Discharge Plan: Skilled Nursing Facility Barriers to Discharge: SNF Pending bed offer  Expected Discharge Plan and Services Expected Discharge Plan: [place]   Discharge Planning Services: CM Consult Post Acute Care Choice: [place] Living arrangements for the past 2 months: Single Family Home Expected Discharge Date: 09/10/20                                     Social Determinants of Health (SDOH) Interventions    Readmission Risk Interventions No flowsheet data found.

## 2020-09-11 NOTE — Progress Notes (Signed)
Occupational Therapy Treatment Patient Details Name: Jeff Rodriguez MRN: [id number] DOB: 1945-05-12 Today's Date: 09/11/2020    History of present illness Patient is a 75 year old male who was admitted to the hospital on 6/22 with COVID 19 and AKI; at [facility], had initiated Renal Replacement Therapy, transferred to [facility];  past medical history significant for multiple myeloma, diabetes mellitus, hypertension, asthma, hyperlipidemia, osteoarthritis.   OT comments  This 75 yo male was not feeling up to doing much due to this morning he had a lot of diarrhea. However he did want to wash his hair due to his head was itching. So he agreed to sit EOB and wash his hair with shampoo cap, then assisted with towel drying it, and combing it. Afterwards he stood at EOB and side stepped up towards [facility] to get in a better position to lay back down. He will continue to benefit from acute OT with follow up at SNF.  Follow Up Recommendations  SNF;Supervision/Assistance - 24 hour    Equipment Recommendations  Tub/shower seat;3 in 1 bedside commode       Precautions / Restrictions Precautions Precautions: Fall       Mobility Bed Mobility Overal bed mobility: Needs Assistance Bed Mobility: Supine to Sit;Sit to Supine     Supine to sit: Min assist Sit to supine: Min assist        Transfers Overall transfer level: Needs assistance Equipment used: 1 person hand held assist Transfers: Sit to/from [facility] Sit to Stand: Min assist Stand pivot transfers: Min assist                ADL either performed or assessed with clinical judgement   ADL Overall ADL's : Needs assistance/impaired       Grooming Details (indicate cue type and reason): Wash hair with shampoo caps sitting EOB min A                                     Vision Patient Visual Report: No change from baseline            Cognition Arousal/Alertness: Awake/alert Behavior During Therapy:  WFL for tasks assessed/performed Overall Cognitive Status: Impaired/Different from baseline Area of Impairment: Safety/judgement                         Safety/Judgement: Decreased awareness of safety                         Pertinent Vitals/ Pain       Pain Assessment: No/denies pain         Frequency  Min 2X/week        Progress Toward Goals  OT Goals(current goals can now be found in the care plan section)  Progress towards OT goals: Progressing toward goals  Acute Rehab OT Goals Patient Stated Goal: to go home but understands the need for rehab first OT Goal Formulation: With patient Time For Goal Achievement: 09/25/20 Potential to Achieve Goals: Good  Plan Discharge plan remains appropriate       AM-PAC OT "6 Clicks" Daily Activity     Outcome Measure   Help from another person eating meals?: A Little Help from another person taking care of personal grooming?: A Little Help from another person toileting, which includes using toliet, bedpan, or urinal?: A Lot Help from another  person bathing (including washing, rinsing, drying)?: A Lot Help from another person to put on and taking off regular upper body clothing?: A Little Help from another person to put on and taking off regular lower body clothing?: A Lot 6 Click Score: 15    End of Session    OT Visit Diagnosis: Muscle weakness (generalized) (M62.81)   Activity Tolerance Patient tolerated treatment well   Patient Left in bed;with call bell/phone within reach;with bed alarm set             Time: 1347-1400 OT Time Calculation (min): 13 min  Charges: OT General Charges $OT Visit: 1 Visit OT Evaluation $OT Eval Moderate Complexity: 1 Mod OT Treatments $Self Care/Home Management : 8-22 mins  [place]en Circle, OTR/L Acute [employer] Pager [phone] Office [phone]      Almon Register 09/11/2020, 5:17 PM

## 2020-09-11 NOTE — Discharge Summary (Signed)
Discharge Summary  Jeff Rodriguez UJW:[id number] DOB: 06/15/1945  PCP: [facility]  Admit date: 08/11/2020 Discharge date: 09/11/2020  Time spent: 35 minutes   Recommendations for Outpatient Follow-up:  Follow-up with your primary care provider Follow-up with hematology oncology for multiple myeloma Continue PT OT with assistance and fall precautions Take your medications as prescribed.  Discharge Diagnoses:  Active Hospital Problems   Diagnosis Date Noted   Acute renal failure ([place]) 08/11/2020   Malnutrition of moderate degree 08/24/2020   Pressure injury of skin 08/23/2020   Failure to thrive in adult 08/15/2020   Multiple myeloma ([place]) 08/12/2020   COVID-19 virus infection 08/11/2020   Hypokalemia 08/11/2020   Pancytopenia ([place]) 08/11/2020   Hyperlipidemia 04/20/2015   Diabetes mellitus with complication [facility])    Essential hypertension     Resolved Hospital Problems   Diagnosis Date Noted Date Resolved   Increased anion gap metabolic acidosis 56/21/3086 08/16/2020    Discharge Condition: Stable.  Diet recommendation: Resume previous diet.  Vitals:   09/11/20 0729 09/11/20 1207  BP: 112/75 101/81  Pulse: 98 92  Resp: 17 18  Temp: 98.9 F (37.2 C) 98.4 F (36.9 C)  SpO2: 99%     History of present illness:   75 y.o. male with medical history significant of multiple myeloma (on [name]), type II diabetes, hypertension, asthma, hyperlipidemia and osteoporosis who was brought in by EMS secondary to productive cough, poor oral intake and poor appetite.  Patient is currently undergoing chemo for his multiple myeloma. Tested positive for COVID 19, found to be hypokalemic and in acute renal failure.  Was seen by nephrology.  in view of severe thrombocytopenia, oncology was consulted and patient received neupogen. Bone scan 08/18/2020 was negative for lytic lesions.  Due to poor prognosis, palliative care team was consulted.  Per nephrology recommendation,  patient was transferred to [facility] for HD.  However, his renal function gradually improved.  Patient has been considered on and off for HD over the last 2 weeks.  Final decision was made on 7/18 where patient will not need HD.  His temporary hemodialysis was been discontinued on 09/08/20.   09/11/20: Patient was seen and examined at his bedside.  He is in good spirit.  He has no new complaints.  Vital signs and labs reviewed and are stable.  His renal function continues to improve.  Urine output 2.7 L recorded in the last 24 hours.  Creatinine downtrending, 3.4 from peak of 7.79 on 08/12/2020.    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 Follow-up with nephrology outpatient. Has been considered for HD multiple times, HD attempted 7/15, stopped after 30 minutes given soft blood pressure. -As discussed with renal, patient unlikely will need any further hemodialysis, so discontinued his temporary hemodialysis catheter. Good urine output.  Multiple myeloma -Seen by Dr. Ennever/oncology. Bone scan does not show  any lytic lesions at this time. -Patient on [name], which is on hold due to renal insufficiency. -Defer to hematology oncology to restart [name]   Urinary retention -800 cc on bladder scan 7/14, Foley catheter inserted, UA was obtained, showing pyuria, received Rocephin, urine  culture growing Enterobacter aerogenes, treated for 7 days. Start date 7/15. Foley catheter removed on 09/10/2020.    Anemia of chronic disease -continue to monitor closely and transfuse for Hgb <7.   Recent Labs       Lab Results  Component Value Date    WBC 6.6 09/10/2020    HGB 8.0 (L) 09/10/2020    HCT 24.1 (L) 09/10/2020    MCV 95.3 09/10/2020    PLT 210 09/10/2020        Underlying depression and oral intake being poor for several weeks according to the son/failure to thrive  Not suicidal homicidal.  Placed on Remeron.   -Encourage oral intake -Pleasant   Severe thrombocytopenia - due to multiple myeloma counts much improved.   Resolved acute metabolic encephalopathy -All mentation has improved, patient is answering questions appropriately and pleasant. Initial CT of the head is negative for acute intracranial abnormalities.TSH, vitamin B12 normal, ammonia level within normal limits, this problem is almost completely resolved.   COVID 19 virus infection. Completed the course of remdesivir.  Therapy evaluations recommending SNF.  He is off isolation.   Resolved hypokalemia and hypomagnesemia.  Both replaced.   Hypertension: Well controlled.   Hyperlipidemia -resume home Crestor   Type 2 diabetes mellitus -labile sugars due to labile oral intake A1c 8.5.  Low-dose glipizide.     Pressure injury on the back.  Pressure Injury 08/21/20 Coccyx Right;Left;Mid Stage 2 -  Partial thickness loss of dermis presenting as a shallow open injury with a red, pink wound bed without slough. (Active)  08/21/20 2000  Location: Coccyx  Location Orientation: Right;Left;Mid  Staging: Stage 2 -  Partial thickness loss of dermis presenting as a shallow open injury with a red, pink wound bed without slough.  Wound Description (Comments):  Present on Admission:     Pressure Injury 08/24/20 Coccyx Medial Stage 2 -  Partial thickness loss of dermis presenting as a shallow open injury with  a red, pink wound bed without slough. pink open (Active)  08/24/20 0910  Location: Coccyx  Location Orientation: Medial  Staging: Stage 2 -  Partial thickness loss of dermis presenting as a shallow open injury with a red, pink wound bed without slough.  Wound Description (Comments): pink open  Present on Admission: No    Wound care   consulted.  Local wound care.       Code Status: DNR - previous discussed with patient in detail on 08/28/2020 wants to be DNR, does not want a feeding tube    Consultants:  Renal, Oncology, Pall Care   Discharge Exam: BP 101/81 (BP Location: Right Arm)   Pulse 92   Temp 98.4 F (36.9 C) (Oral)   Resp 18   Ht $R'5\' 8"'Vx$  (1.727 m)   Wt 62 kg   SpO2 99%   BMI 20.78 kg/m  General: 75 y.o. year-old male well developed well nourished in no acute distress.  Alert and oriented x3. Cardiovascular: Regular rate and rhythm with no rubs or gallops.  No thyromegaly or JVD noted.   Respiratory: Clear to auscultation with no wheezes or rales. Good inspiratory effort. Abdomen: Soft nontender nondistended with normal bowel sounds x4 quadrants. Musculoskeletal: No lower extremity edema.  2/4 pulses in all 4 extremities. Skin: No ulcerative lesions noted or rashes, Psychiatry: Mood is appropriate for condition and setting  Discharge Instructions You were cared for by a hospitalist during your hospital stay. If you have any questions about your discharge medications or the care you received while you were in the hospital after you are discharged, you can call the unit and asked to speak with the hospitalist on call if the hospitalist that took care of you is not available. Once you are discharged, your primary care physician will handle any further medical issues. Please note that NO REFILLS for any discharge medications will be authorized once you are discharged, as it is imperative that you return to your primary care physician (or establish a relationship with a primary  care physician if you do not have one) for your aftercare needs so that they can reassess your need for medications and monitor your lab values.   Allergies as of 09/11/2020       Reactions   Shrimp [shellfish Allergy] Anaphylaxis   Iodine Swelling   Sulfa Antibiotics Other (See Comments)   other   Cephalexin Rash, Hives   Gabapentin Rash   Oxacillin Rash        Medication List     STOP taking these medications    amitriptyline 10 MG tablet Commonly known as: ELAVIL   carvedilol 12.5 MG tablet Commonly known as: COREG   insulin glargine 100 UNIT/ML injection Commonly known as: LANTUS   lenalidomide 15 MG capsule Commonly known as: [name]   metFORMIN 1000 MG tablet Commonly known as: GLUCOPHAGE   Oxycodone HCl 10 MG Tabs   potassium chloride SA 20 MEQ tablet Commonly known as: KLOR-CON       TAKE these medications    acetaminophen 500 MG tablet Commonly known as: TYLENOL Take 500 mg by mouth 4 (four) times daily.   albuterol 108 (90 Base) MCG/ACT inhaler Commonly known as: VENTOLIN HFA Inhale 2 puffs into the lungs every 6 (six) hours as needed for wheezing or shortness of breath.   Calcium Carb-Cholecalciferol 600-400 MG-UNIT Tabs Take 1 tablet by mouth daily.   dexamethasone 4 MG tablet Commonly known as: DECADRON Take 20 mg by mouth once a week.   Ensure Plus Liqd Take 237 mLs by mouth daily.   glipiZIDE 5 MG tablet Commonly known as: GLUCOTROL Take 0.5 tablets (2.5 mg total) by mouth daily before breakfast.   midodrine 10 MG tablet Commonly known as: PROAMATINE Take 1 tablet (10 mg total) by mouth 2 (two) times daily with a meal.   mirtazapine 15 MG disintegrating tablet Commonly known as: REMERON SOL-TAB Take 1 tablet (15 mg total) by mouth at bedtime.   multivitamin with minerals Tabs tablet Take 1 tablet by mouth daily. Start taking on: September 12, 2020   polyethylene glycol 17 g packet Commonly known as: MIRALAX / GLYCOLAX Take  17 g by mouth daily as needed for moderate constipation or severe constipation.   rosuvastatin 40 MG tablet Commonly known as: CRESTOR Take 20 mg by mouth daily.   tamsulosin 0.4 MG Caps capsule Commonly known as: FLOMAX Take 0.8 mg by mouth at bedtime.   Thera-Gesic 0.5-15 % Crea Apply 1 application topically 3 (three) times daily.   Zoledronic Acid 4 MG Solr Inject 3.3 mg into the vein every 30 (thirty) days.       Allergies  Allergen Reactions   Shrimp [Shellfish Allergy] Anaphylaxis   Iodine Swelling   Sulfa Antibiotics  Other (See Comments)    other   Cephalexin Rash and Hives   Gabapentin Rash   Oxacillin Rash      The results of significant diagnostics from this hospitalization (including imaging, microbiology, ancillary and laboratory) are listed below for reference.    Significant Diagnostic Studies: DG Abd 1 View  Result Date: 08/27/2020 CLINICAL DATA:  Nausea. EXAM: ABDOMEN - 1 VIEW COMPARISON:  08/21/2020 FINDINGS: The upper abdomen was incompletely imaged. A feeding tube is no longer seen. Gas is present in scattered loops of nondilated small and large bowel, predominantly in the right mid abdomen. There is no evidence of bowel obstruction. A right hip arthroplasty and prior augmentation of multiple lumbar compression fractures are again noted. There are surgical clips in the right upper quadrant. IMPRESSION: Nonobstructed bowel gas pattern. Electronically Signed   By: Logan Bores M.D.   On: 08/27/2020 11:58   DG Abd 1 View  Result Date: 08/21/2020 CLINICAL DATA:  Check feeding catheter placement and abdominal distension EXAM: ABDOMEN - 1 VIEW COMPARISON:  08/19/2020 FINDINGS: Feeding catheter is noted extending towards the proximal portion of the duodenum. Gaseous distension of the stomach is noted. Scattered large and small bowel gas is noted. Changes of prior vertebral augmentation are again noted. No free air is seen. IMPRESSION: Overall stable appearance of  the abdomen. Feeding catheter is noted extending into the proximal duodenum. Electronically Signed   By: Inez Catalina M.D.   On: 08/21/2020 11:18   DG Abd 1 View  Result Date: 08/19/2020 CLINICAL DATA:  NG tube placement EXAM: ABDOMEN - 1 VIEW COMPARISON:  None. FINDINGS: Feeding tube is in place with the tip in the right upper abdomen. This could be in the distal stomach or proximal duodenum. IMPRESSION: Feeding tube tip in the distal stomach or proximal duodenum. Electronically Signed   By: Rolm Baptise M.D.   On: 08/19/2020 20:15   CT HEAD WO CONTRAST  Result Date: 08/19/2020 CLINICAL DATA:  Mental status change.  COVID-19 positive. EXAM: CT HEAD WITHOUT CONTRAST TECHNIQUE: Contiguous axial images were obtained from the base of the skull through the vertex without intravenous contrast. COMPARISON:  None. FINDINGS: Brain: There is extensive encephalomalacia in the right occipital and posterior right temporal lobes consistent with a chronic PCA infarct with associated dystrophic calcification and ex vacuo dilatation of the right lateral ventricle. There is also encephalomalacia at the right temporoparietal junction (posterior MCA territory/border zone). A small cortical infarct in the left occipital lobe is also felt to be chronic. No acute large territory infarct, intracranial hemorrhage, mass, midline shift, or extra-axial fluid collection is identified. Vascular: Calcified atherosclerosis at the skull base. No hyperdense vessel. Skull: No fracture or suspicious osseous lesion. Sinuses/Orbits: Extensive opacification of the paranasal sinuses bilaterally by combination of mucosal thickening and fluid. Small bilateral mastoid effusions. Bilateral cataract extraction. Other: Partially visualized nasoenteric tube. IMPRESSION: 1. No evidence of acute intracranial abnormality. 2. 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 Electronically Signed   By: Logan Bores M.D.   On: 08/19/2020 18:24    DG Chest Port 1 View  Result Date: 08/26/2020 CLINICAL DATA:  Shortness of breath. EXAM: PORTABLE CHEST 1 VIEW COMPARISON:  08/21/2020.  08/19/2020. FINDINGS: Right IJ sheath noted with tip over SVC. Heart size normal. Mild bibasilar atelectasis/infiltrates. Right costophrenic angle incompletely imaged. Interim resolution of bilateral pleural effusions. No pneumothorax. Prior cervical spine fusion. IMPRESSION: 1.  Right IJ sheath noted with tip over SVC. 2. Mild bibasilar atelectasis/infiltrates.  Interim resolution of bilateral pleural effusions. Electronically Signed   By: Marcello Moores  Register   On: 08/26/2020 15:23   DG CHEST PORT 1 VIEW  Result Date: 08/21/2020 CLINICAL DATA:  COVID-19 positivity with increased cough, initial encounter EXAM: PORTABLE CHEST 1 VIEW COMPARISON:  08/19/2020 FINDINGS: Feeding catheter and right jugular central line are again seen and stable. Postsurgical changes in the cervical spine are noted. Cardiac shadow is stable. Small basilar effusions are noted. No focal infiltrate or pneumothorax is noted. IMPRESSION: Small bilateral pleural effusions. Tubes and lines as described above stable in appearance. Electronically Signed   By: Inez Catalina M.D.   On: 08/21/2020 08:22   DG CHEST PORT 1 VIEW  Result Date: 08/19/2020 CLINICAL DATA:  Central line placement EXAM: PORTABLE CHEST 1 VIEW COMPARISON:  08/11/2020 FINDINGS: Right central line is been placed with the tip in the SVC. No pneumothorax. Heart is normal size. Left basilar atelectasis or infiltrate, new since prior study. No visible effusions. OG tube is seen entering the stomach. The distal aspect of the tube is not visualized. IMPRESSION: Right central line tip in the SVC.  No pneumothorax. New left lower lobe atelectasis or infiltrate. Electronically Signed   By: Rolm Baptise M.D.   On: 08/19/2020 20:14   DG Abd Portable 1V  Result Date: 09/02/2020 CLINICAL DATA:  Vomiting EXAM: PORTABLE ABDOMEN - 1 VIEW COMPARISON:   08/27/2020 FINDINGS: Nonobstructive bowel gas pattern. Moderate volume of stool within the colon. No gross free intraperitoneal air on supine imaging. Cholecystectomy clips and right hip arthroplasty. Multilevel thoracolumbar compression fractures status post cement augmentation. IMPRESSION: Nonobstructive bowel gas pattern. Electronically Signed   By: Davina Poke D.O.   On: 09/02/2020 13:04   DG Abd Portable 1V  Result Date: 08/17/2020 CLINICAL DATA:  Feeding tube EXAM: PORTABLE ABDOMEN - 1 VIEW COMPARISON:  04/24/2019 FINDINGS: Esophageal tube tip overlies distal stomach. Surgical clips in the right upper quadrant. Multiple treated compression deformities of the thoracolumbar spine. Mild air distended small bowel in the right abdomen up to 3.5 cm with scattered colon gas and more focally dilated probable colon in the pelvis. IMPRESSION: 1. Esophageal tube tip overlies distal stomach. 2. Mild air distension of small bowel in the right mid abdomen with scattered colon gas, question mild ileus Electronically Signed   By: Donavan Foil M.D.   On: 08/17/2020 14:12   DG Bone Survey Met  Result Date: 08/18/2020 CLINICAL DATA:  History of myeloma. EXAM: METASTATIC BONE SURVEY COMPARISON:  Various previous imaging studies. FINDINGS: Lateral skull: No lytic lesion. Right shoulder: No lytic lesion.  Chronic degenerative changes. Left shoulder: No lytic lesion.  Chronic degenerative changes. Left humerus: No lytic lesion. Right humerus: No lytic lesion. Right forearm: No lytic lesion. Left forearm: No lytic lesion. Cervical spine: Previous anterior and posterior fusion. No evidence of active lytic lesion. Thoracic spine two views: Distant augmentation at T10 and T12. No lytic lesion or fracture seen otherwise. Lumbar spine two views: Previous augmentation at L1, L2, L3 and L5. No sign of lytic lesion. No progressive collapse. AP pelvis one view: No lytic lesion. Previous hip replacement on the right. Left hip: No  lytic lesion Left femur: No lytic lesion.  Previous knee replacement. Right femur: No lytic lesion. Previous hip replacement and knee replacement. Right hip: No lytic lesion.  Previous hip replacement. Right tib fib: No lytic lesion. Left hip fib: No lytic lesion. AP chest one view: Soft feeding tube enters the abdomen. Atelectasis or infiltrate  in both lung bases, new since the study 1 week ago. No lytic lesions seen in the ribcage. IMPRESSION: No skeletal lytic lesions. Multiple augmented thoracic and lumbar fractures. No new or progressive fractures. Newly seen bilateral lower lobe atelectasis and or pneumonia. Electronically Signed   By: Nelson Chimes M.D.   On: 08/18/2020 15:14    Microbiology: Recent Results (from the past 240 hour(s))  Urine Culture     Status: Abnormal   Collection Time: 09/04/20 11:58 AM   Specimen: Urine, Catheterized  Result Value Ref Range Status   Specimen Description URINE, CATHETERIZED  Final   Special Requests   Final    NONE Performed at [facility], [address] [place], [place] [address]    Culture 40,000 COLONIES/mL ENTEROBACTER AEROGENES (A)  Final   Report Status 09/06/2020 FINAL  Final   Organism ID, Bacteria ENTEROBACTER AEROGENES (A)  Final      Susceptibility   Enterobacter aerogenes - MIC*    CEFAZOLIN RESISTANT Resistant     CEFEPIME <=0.12 SENSITIVE Sensitive     CEFTRIAXONE <=0.25 SENSITIVE Sensitive     CIPROFLOXACIN <=0.25 SENSITIVE Sensitive     GENTAMICIN <=1 SENSITIVE Sensitive     IMIPENEM 1 SENSITIVE Sensitive     NITROFURANTOIN 64 INTERMEDIATE Intermediate     TRIMETH/SULFA <=20 SENSITIVE Sensitive     PIP/TAZO <=4 SENSITIVE Sensitive     * 40,000 COLONIES/mL ENTEROBACTER AEROGENES  Resp Panel by RT-PCR (Flu A&B, Covid) Nasopharyngeal Swab     Status: Abnormal   Collection Time: 09/11/20  1:01 PM   Specimen: Nasopharyngeal Swab; Nasopharyngeal(NP) swabs in vial transport medium  Result Value Ref Range Status   SARS  Coronavirus 2 by RT PCR POSITIVE (A) NEGATIVE Final    Comment: RESULT CALLED TO, READ BACK BY AND VERIFIED WITH: Alfonse Ras RN 1427 09/11/20 A BROWNING (NOTE) SARS-CoV-2 target nucleic acids are DETECTED.  The SARS-CoV-2 RNA is generally detectable in upper respiratory specimens during the acute phase of infection. Positive results are indicative of the presence of the identified virus, but do not rule out bacterial infection or co-infection with other pathogens not detected by the test. Clinical correlation with patient history and other diagnostic information is necessary to determine patient infection status. The expected result is Negative.  Fact Sheet for Patients: EntrepreneurPulse.com.au  Fact Sheet for Healthcare Providers: IncredibleEmployment.be  This test is not yet approved or cleared by the [place] FDA and  has been authorized for detection and/or diagnosis of SARS-CoV-2 by FDA under an Emergency Use Authorization (EUA).  This EUA will remain in effect (meaning this test can b e used) for the duration of  the COVID-19 declaration under Section 564(b)(1) of the Act, 21 U.S.C. section 360bbb-3(b)(1), unless the authorization is terminated or revoked sooner.     Influenza A by PCR NEGATIVE NEGATIVE Final   Influenza B by PCR NEGATIVE NEGATIVE Final    Comment: (NOTE) The Xpert Xpress SARS-CoV-2/FLU/RSV plus assay is intended as an aid in the diagnosis of influenza from Nasopharyngeal swab specimens and should not be used as a sole basis for treatment. Nasal washings and aspirates are unacceptable for Xpert Xpress SARS-CoV-2/FLU/RSV testing.  Fact Sheet for Patients: EntrepreneurPulse.com.au  Fact Sheet for Healthcare Providers: IncredibleEmployment.be  This test is not yet approved or cleared by the [place] FDA and has been authorized for detection and/or diagnosis of SARS-CoV-2  by FDA under an Emergency Use Authorization (EUA). This EUA will remain in effect (meaning  this test can be used) for the duration of the COVID-19 declaration under Section 564(b)(1) of the Act, 21 U.S.C. section 360bbb-3(b)(1), unless the authorization is terminated or revoked.  Performed at [facility], [place] [address], [place], [place] [address]      Labs: Basic Metabolic Panel: Recent Labs  Lab 09/05/20 0116 09/06/20 0314 09/07/20 0335 09/08/20 0101 09/09/20 0204 09/10/20 0333  NA 133* 136 133* 134* 133* 132*  K 3.2* 3.2* 3.7 3.5 3.4* 3.8  CL 99 99 97* 99 97* 95*  CO2 $Re'26 29 29 26 26 25  'xcX$ GLUCOSE 122* 137* 222* 147* 274* 254*  BUN 49* 52* 53* 48* 51* 54*  CREATININE 3.84* 3.90* 3.73* 3.59* 3.58* 3.40*  CALCIUM 8.4* 8.6* 8.5* 8.8* 9.2 9.2  PHOS 2.7 2.4* 2.1* 2.8  --   --    Liver Function Tests: Recent Labs  Lab 09/05/20 0116 09/06/20 0314 09/07/20 0335 09/08/20 0101  ALBUMIN 2.5* 2.4* 2.3* 2.6*   No results for input(s): LIPASE, AMYLASE in the last 168 hours. No results for input(s): AMMONIA in the last 168 hours. CBC: Recent Labs  Lab 09/06/20 0314 09/07/20 0335 09/08/20 0101 09/09/20 0204 09/10/20 0333  WBC 4.5 5.5 6.2 5.5 6.6  HGB 7.4* 7.4* 8.2* 8.1* 8.0*  HCT 23.2* 22.6* 25.0* 24.8* 24.1*  MCV 96.7 96.2 95.1 96.5 95.3  PLT 196 194 215 228 210   Cardiac Enzymes: No results for input(s): CKTOTAL, CKMB, CKMBINDEX, TROPONINI in the last 168 hours. BNP: BNP (last 3 results) Recent Labs    08/28/20 0054 08/29/20 0149 08/30/20 0059  BNP 164.8* 82.8 58.0    ProBNP (last 3 results) No results for input(s): PROBNP in the last 8760 hours.  CBG: Recent Labs  Lab 09/10/20 1227 09/10/20 1635 09/10/20 2009 09/11/20 0806 09/11/20 1213  GLUCAP 223* 182* 138* 140* 289*       Signed:  Kayleen Memos, MD [facility] 09/11/2020, 3:28 PM

## 2020-09-11 NOTE — Progress Notes (Signed)
Physical Therapy Treatment Patient Details Name: Jeff Rodriguez MRN: [id number] DOB: [birth year] Today's Date: 09/11/2020    History of Present Illness Patient is a 75 year old male who was admitted to the hospital on 6/22 with COVID 19 and AKI; at [facility], had initiated Renal Replacement Therapy, transferred to [facility];  past medical history significant for multiple myeloma, diabetes mellitus, hypertension, asthma, hyperlipidemia, osteoarthritis.    PT Comments    Patient received in bed reporting he is having another round of BMs- upon inspection, actively having diarrhea. Performed some pericare, then assisted him in rolling for placement of bed pan. Education provided on staying on his back as much as possible for positioning of bedpan as well as importance of limiting time on the pan. We had hoped that he would be able to release enough BM to be able to participate in PT, but bowel movement was ongoing and session very limited. Left in bed on bedpan with all needs met, nursing staff aware of patient status. Will continue to follow.    Follow Up Recommendations  SNF     Equipment Recommendations  Rolling walker with 5" wheels;3in1 (PT)    Recommendations for Other Services       Precautions / Restrictions Precautions Precautions: Fall Restrictions Weight Bearing Restrictions: No    Mobility  Bed Mobility Overal bed mobility: Needs Assistance Bed Mobility: Rolling Rolling: Min assist         General bed mobility comments: light MinA to roll all the way over on his side for bed pan placement    Transfers                 General transfer comment: deferred- having liquid stools  Ambulation/Gait             General Gait Details: deferred- liquid stools   Stairs             Wheelchair Mobility    Modified Rankin (Stroke Patients Only)       Balance                                            Cognition Arousal/Alertness:  Awake/alert Behavior During Therapy: WFL for tasks assessed/performed Overall Cognitive Status: Impaired/Different from baseline                     Current Attention Level: Sustained Memory: Decreased short-term memory Following Commands: Follows one step commands with increased time;Follows multi-step commands with increased time       General Comments: pleasant and cooperative, session limited as pt having liquid stool and was not able to participate in extensive activity      Exercises      General Comments General comments (skin integrity, edema, etc.): deferred getting to EOB/OOB and unable to assess balance- having repeated liquid bowel movements      Pertinent Vitals/Pain Pain Assessment: Faces Faces Pain Scale: Hurts little more Pain Location: generalized discomfort from BM Pain Descriptors / Indicators: Discomfort Pain Intervention(s): Limited activity within patient's tolerance;Monitored during session    Home Living                      Prior Function            PT Goals (current goals can now be found in the care plan section) Acute Rehab PT Goals  Patient Stated Goal: Go home PT Goal Formulation: With patient Time For Goal Achievement: 09/14/20 Potential to Achieve Goals: Good Progress towards PT goals: Not progressing toward goals - comment (limited by liquid BMs today)    Frequency    Min 2X/week      PT Plan Current plan remains appropriate    Co-evaluation              AM-PAC PT "6 Clicks" Mobility   Outcome Measure  Help needed turning from your back to your side while in a flat bed without using bedrails?: A Little Help needed moving from lying on your back to sitting on the side of a flat bed without using bedrails?: A Lot Help needed moving to and from a bed to a chair (including a wheelchair)?: A Little Help needed standing up from a chair using your arms (e.g., wheelchair or bedside chair)?: A Little Help  needed to walk in hospital room?: A Little Help needed climbing 3-5 steps with a railing? : Total 6 Click Score: 15    End of Session   Activity Tolerance: Other (comment) (session limited by liquid BMs) Patient left: in bed;with call bell/phone within reach;Other (comment) (on bed pan) Nurse Communication: Mobility status PT Visit Diagnosis: Other abnormalities of gait and mobility (R26.89);Muscle weakness (generalized) (M62.81)     Time: 5643-3295 PT Time Calculation (min) (ACUTE ONLY): 13 min  Charges:  $Therapeutic Activity: 8-22 mins                    Windell Norfolk, DPT, PN1   Supplemental Physical Therapist [place]    Pager [phone] Acute Rehab Office [phone]

## 2020-09-11 NOTE — Plan of Care (Signed)
  Problem: Health Behavior/Discharge Planning: Goal: Ability to manage health-related needs will improve Outcome: Progressing   Problem: Education: Goal: Knowledge of General Education information will improve Description: Including pain rating scale, medication(s)/side effects and non-pharmacologic comfort measures Outcome: Progressing   

## 2020-09-11 NOTE — Progress Notes (Signed)
PT Cancellation Note  Patient Details Name: Jeff Rodriguez MRN: [id number] DOB: 01-Mar-1945   Cancelled Treatment:    Reason Eval/Treat Not Completed: Other (comment) getting care from nursing and not available for PT. Will attempt to return if time/schedule allow.   Windell Norfolk, DPT, PN1   Supplemental Physical Therapist [facility]    Pager [phone] Acute Rehab Office [phone]

## 2020-09-11 NOTE — Progress Notes (Addendum)
Subjective:   Patient failed voiding trials yesterday. Urinary retention overnight. Constipation has improved, moved bowel yesterday. No nausea or SOB. UOP of 2.7 L.   Objective Vital signs in last 24 hours: Vitals:   09/10/20 2316 09/11/20 0306 09/11/20 0400 09/11/20 0729  BP: 105/68  110/76 112/75  Pulse: (!) 101  98 98  Resp: $Remo'19  20 17  'MLuUM$ Temp: 98 F (36.7 C)  98.2 F (36.8 C) 98.9 F (37.2 C)  TempSrc: Oral  Axillary Oral  SpO2: 100%  100% 99%  Weight:  62 kg    Height:       Weight change:   Intake/Output Summary (Last 24 hours) at 09/11/2020 1138 Last data filed at 09/11/2020 0936 Gross per 24 hour  Intake 357 ml  Output 1116 ml  Net -759 ml    Assessment/ Plan: Pt is a 75 y.o. yo male with PMH of multiple myeloma who was admitted on 08/11/2020 with Covid19 and found to have acute on chronic renal failure.   Assessment/Plan: 1. AKI on CKD2 - Likely 2/2 ATN from hypotension/ACEi. Baseline sCr around 1.7 around 02/2020. Presented with sCr of 7.3. Required dialysis in the form of CRRT when at [facility] this hosp. Kidney function improving slowly (sCr 3.59->3.58->3.40). Attempted trioal of void yesterday, but pt unable to void. Multiple bladder scans showed urinary retention of ~300-500 cc. Reports a remote hx of self-catheterization due to BPH. UOP of 2.7 L in last 24 hrs.   --Continue to encouraging increased po intake  --Re-insert new foley  --Trend BMP, electrolytes, every other day  --Strict I/Os, daily weight  --Pending insurance auth to local SNF  --Plan for outpatient follow up with [facility] 2. HTN/volume: Stable with SBP in the 100-110s. Continue midodrine 10 mg BID 3. Anemia: S/p 1 unit pRBC. Hgb stable at 8.0. Continue to monitor closely. Iron stores OK-  have him on aranesp. Lab holiday every other day.  4. Malnutrition: Weight down to 136 lbs. Previously around 150-160 before hospitalization, per patient. Continue Regular diet and nutritional supplements. 5.  Hypocalcemia: Asymptomatic. Albumin (2.3->2.6) and calcium (8.5->8.8) trending up.  6. Hx of multiple myeloma: Stable. Negative bone scan.  7. Pancytopenia: management per hem/onc 8. Covid19 infection: No respiratory symptoms. Completed course Remdesivir.   Dispo: Pending SNF placement. We will work on establishing him with a nephrologist after a SNF is found.   Lacinda Axon   Patient seen and examined, agree with above note with above modifications. Did not tolerate having foley removed-  req in and out cath which he did not like-  would just rather have foley replaced.  Good UOP.  No labs today.  It is my understanding that now the search for SNF will be local-  once that is established can establish OP nephrology follow up for him.  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 No results for input(s): AMMONIA in the last 168 hours. CBC: Recent Labs  Lab 09/06/20 0314 09/07/20 0335 09/08/20 0101 09/09/20 0204 09/10/20 0333  WBC 4.5 5.5 6.2 5.5 6.6  HGB 7.4* 7.4* 8.2* 8.1* 8.0*  HCT 23.2* 22.6* 25.0* 24.8* 24.1*  MCV 96.7 96.2 95.1 96.5 95.3  PLT 196 194 215 228 210   Cardiac Enzymes: No results for input(s): CKTOTAL, CKMB, CKMBINDEX, TROPONINI in the last 168 hours. CBG: Recent Labs  Lab 09/09/20 2031 09/10/20 0756 09/10/20 1227 09/10/20 1635 09/10/20 2009  GLUCAP 171*  159* 223* 182* 138*    Iron Studies:  No results for input(s): IRON, TIBC, TRANSFERRIN, FERRITIN in the last 72 hours.  Studies/Results: No results found. Medications: Infusions:  sodium chloride 250 mL (09/09/20 1226)   cefTRIAXone (ROCEPHIN)  IV 1 g (09/10/20 1324)    Scheduled Medications:  calcium carbonate  1,000 mg of elemental calcium Oral BID WC   chlorhexidine  15 mL Mouth Rinse BID   Chlorhexidine Gluconate Cloth  6 each Topical Q0600   [START ON 09/14/2020] darbepoetin (ARANESP) injection - DIALYSIS  100 mcg Subcutaneous Q Mon-HD   feeding supplement  237 mL Oral TID BM   finasteride  5 mg Oral Daily   heparin injection (subcutaneous)  5,000 Units Subcutaneous Q8H   insulin aspart  0-5 Units Subcutaneous QHS   insulin aspart  0-9 Units Subcutaneous TID WC   lactose free nutrition  237 mL Oral TID WC   lidocaine  1 patch Transdermal Q24H   magic mouthwash  10 mL Oral TID   mouth rinse  15 mL Mouth Rinse q12n4p   midodrine  10 mg Oral BID WC   mirtazapine  15 mg Oral QHS   multivitamin with minerals  1 tablet Oral Daily   senna-docusate  2 tablet Oral BID   tamsulosin  0.4 mg Oral QHS    have reviewed scheduled and prn medications.  Physical Exam: General: Pleasant, chronically-ill, cachetic elderly man in bed. NAD Heart: RRR. No m/r/g. No LE edema Lungs: Lungs CTAB. Decreased air movement at the bases. No wheezing or rales.  Abdomen: Soft. NT/ND. Hyperactive bowel sounds. Extremities: Normal ROM. No LE edema.  Neuro: A&Ox3. No focal deficits   09/11/2020,8:04 AM  LOS: 31 days

## 2022-10-14 IMAGING — DX DG CHEST 1V PORT
2 series · 2 of 2 positions shown · non-contrast
Comparison: 08/11/2020

CLINICAL DATA: Central line placement

EXAM:
PORTABLE CHEST 1 VIEW

[chest ap (1 of 2)]
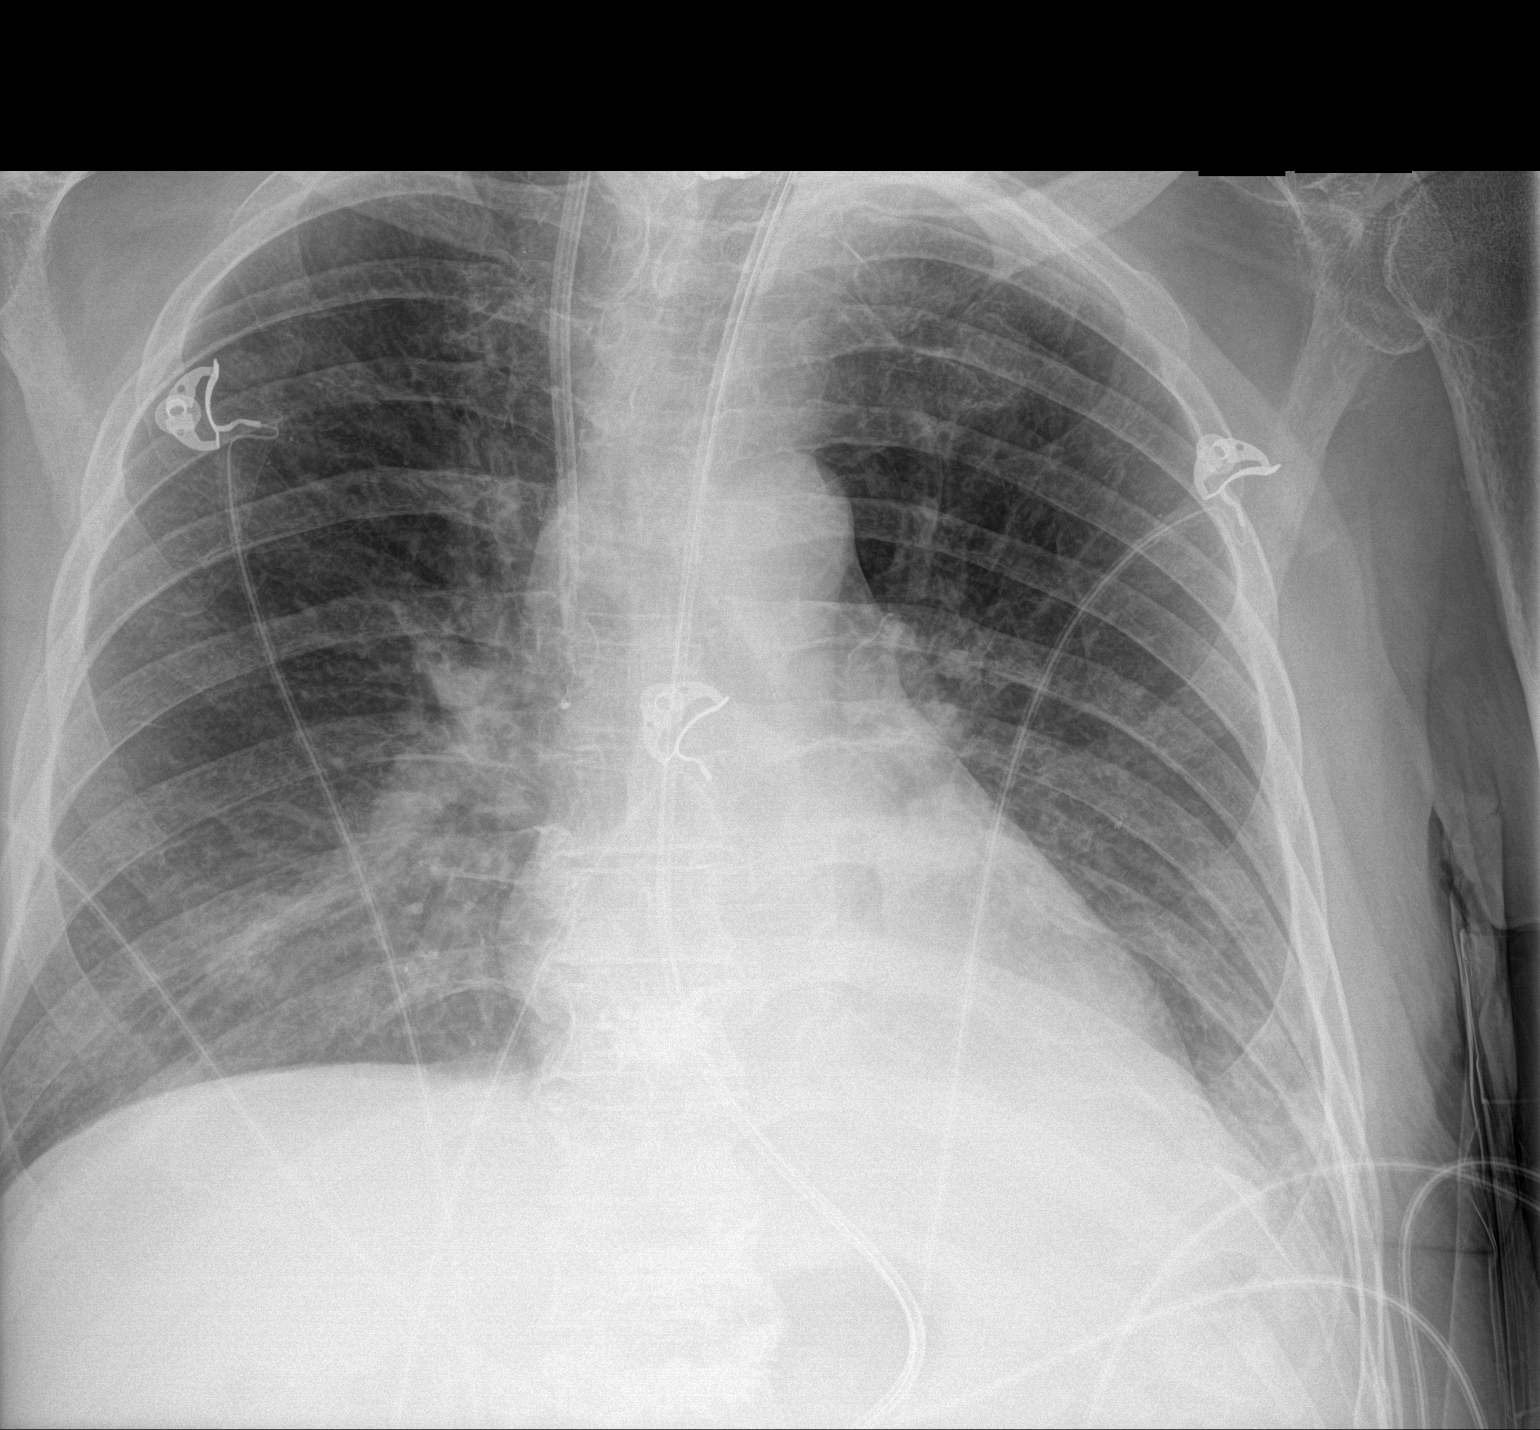

[chest ap (2 of 2)]
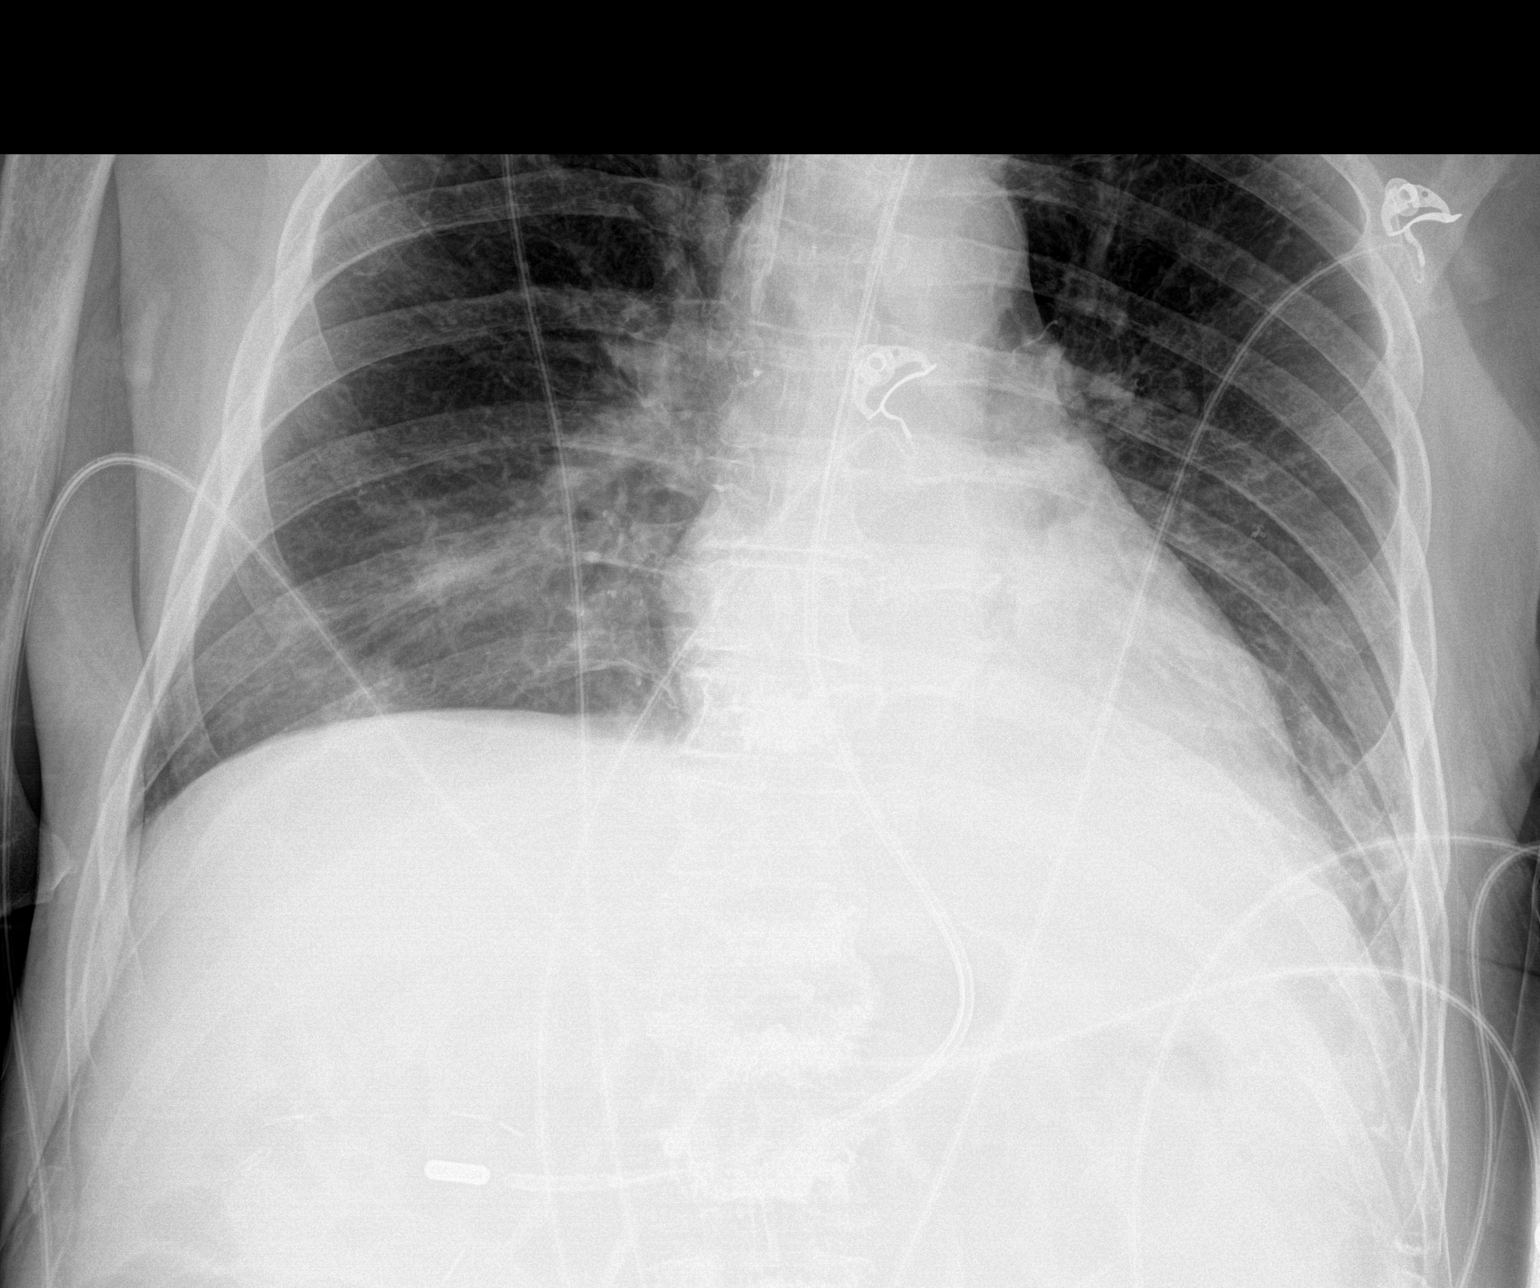

[2 of 2 positions shown; findings below may reference images not displayed]

FINDINGS: Right central line is been placed with the tip in the SVC. No
pneumothorax. Heart is normal size. Left basilar atelectasis or
infiltrate, new since prior study. No visible effusions. OG tube is
seen entering the stomach. The distal aspect of the tube is not
visualized.
IMPRESSION: Right central line tip in the SVC.  No pneumothorax.

New left lower lobe atelectasis or infiltrate.

## 2022-10-14 IMAGING — CT CT HEAD W/O CM
3 of 5 series · 14 of 47 positions shown, 16 images · non-contrast
Comparison: None.

CLINICAL DATA: Mental status change.  [id number] positive.

EXAM:
CT HEAD WITHOUT CONTRAST
TECHNIQUE: Contiguous axial images were obtained from the base of the skull
through the vertex without intravenous contrast.

[Series 4: coronal soft tissue · coronal · 0.35mm/px · 3 of 74 slices shown]
[im 25/74  brain]
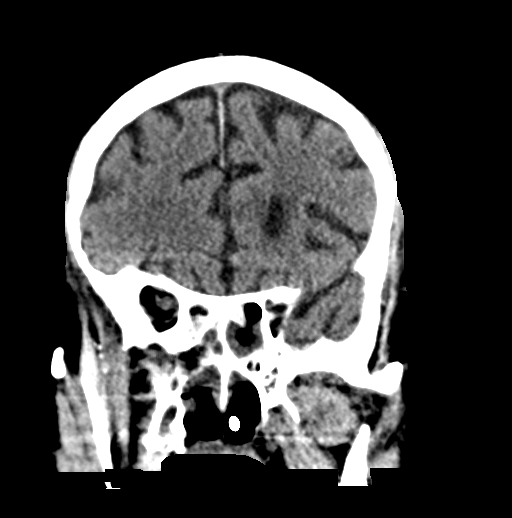
[im 33/74  brain]
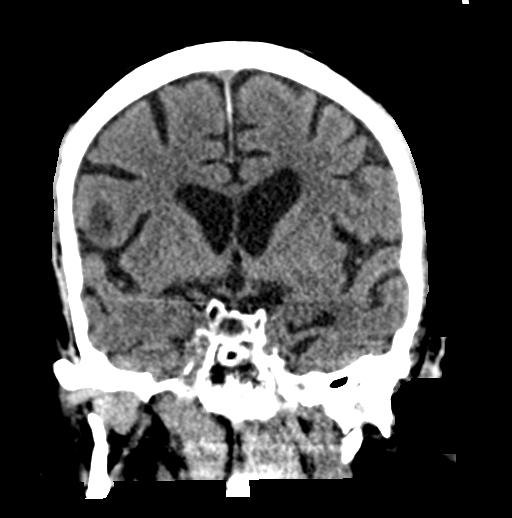
[im 41/74  brain]
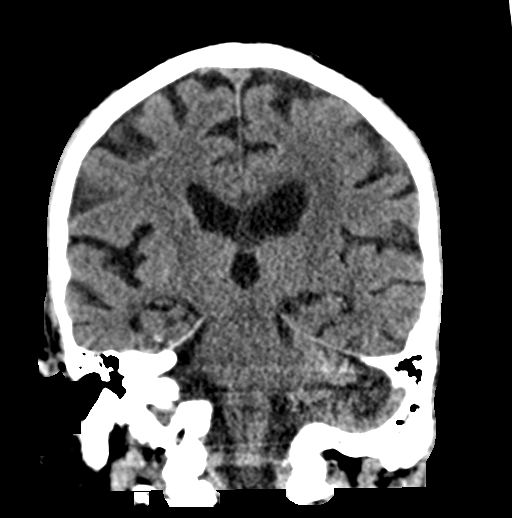

[Series 5: sagittal soft tissue · sagittal · 0.36mm/px · 3 of 67 slices shown]
[im 23/67  brain]
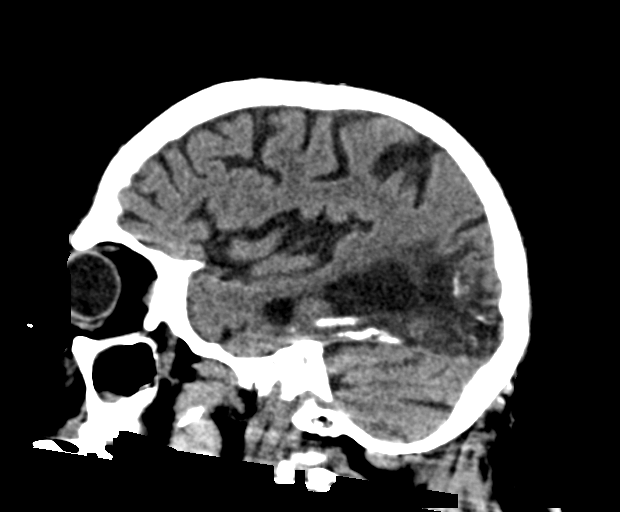
[im 34/67  brain]
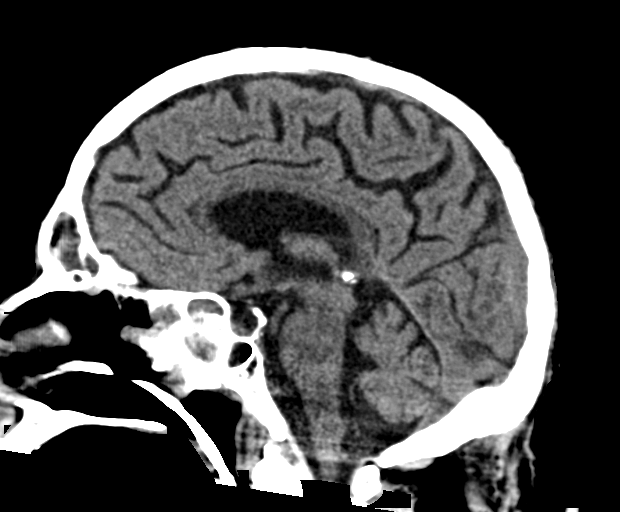
[im 45/67  brain]
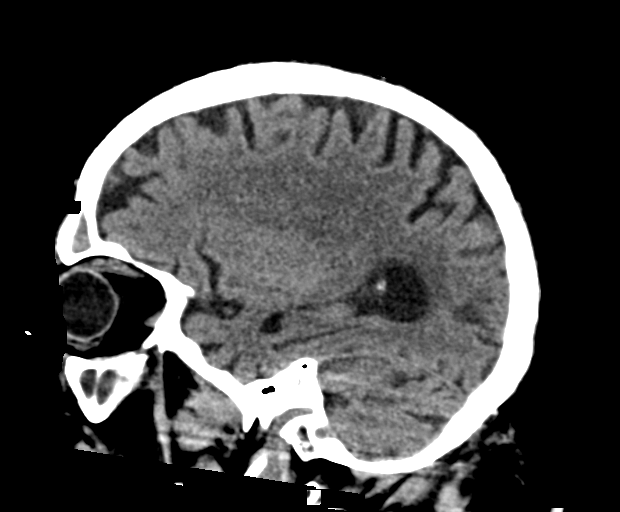

[Series 6: axial · axial · 0.35mm/px · z∈[-105,+35]mm · 8 of 61 slices shown, 10 images]
[im 7/61  brain]
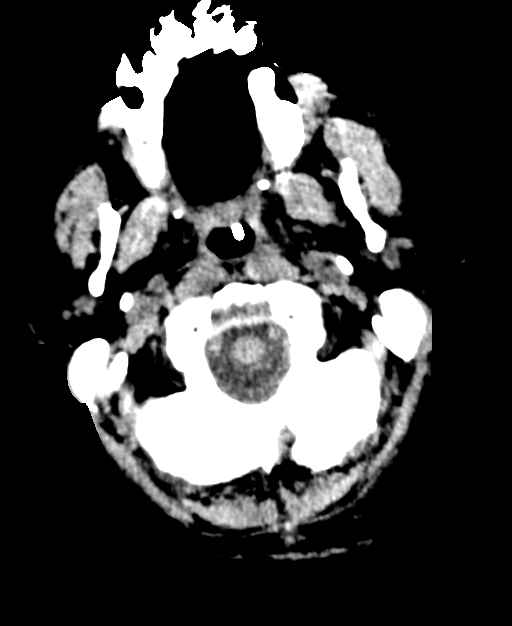
[im 7/61  bone]
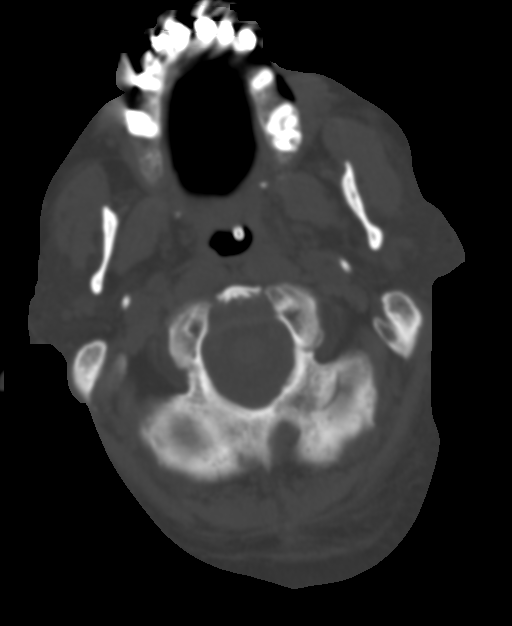
[im 14/61  brain]
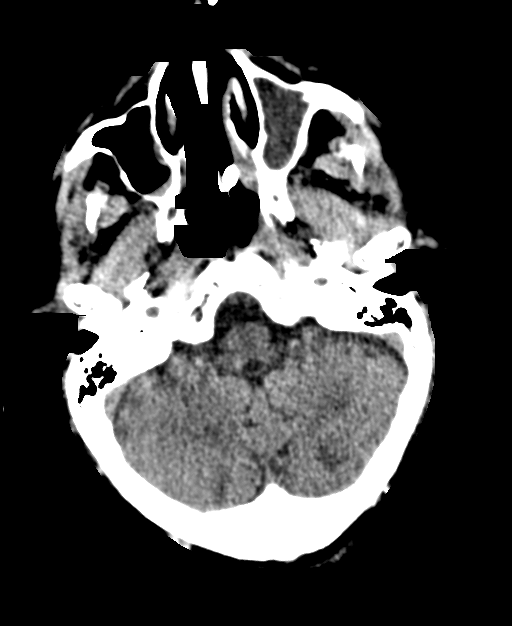
[im 21/61  brain]
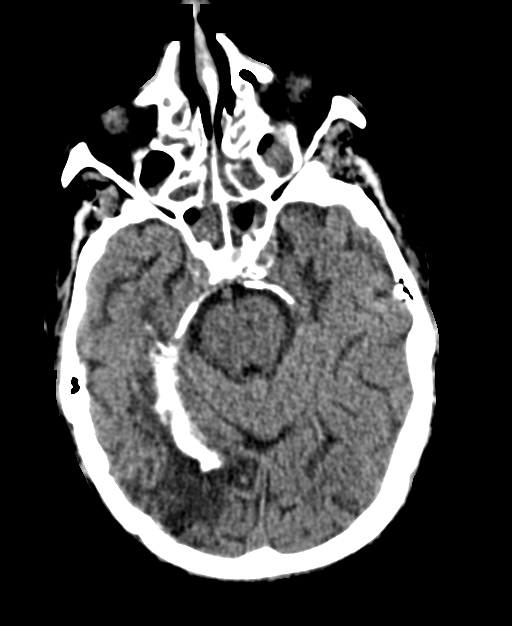
[im 27/61  brain]
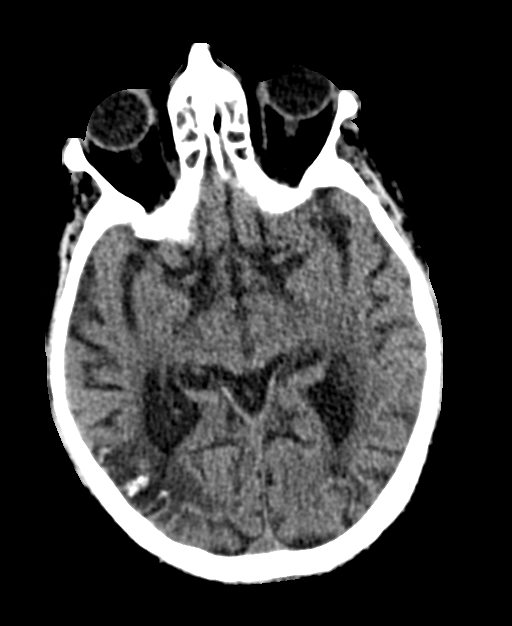
[im 34/61  brain]
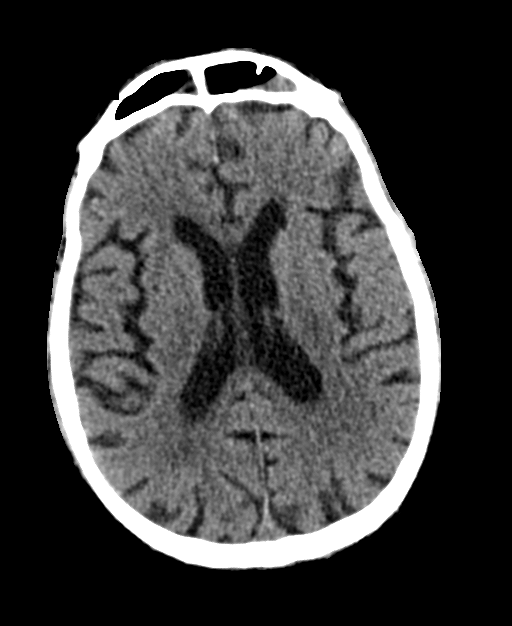
[im 34/61  bone]
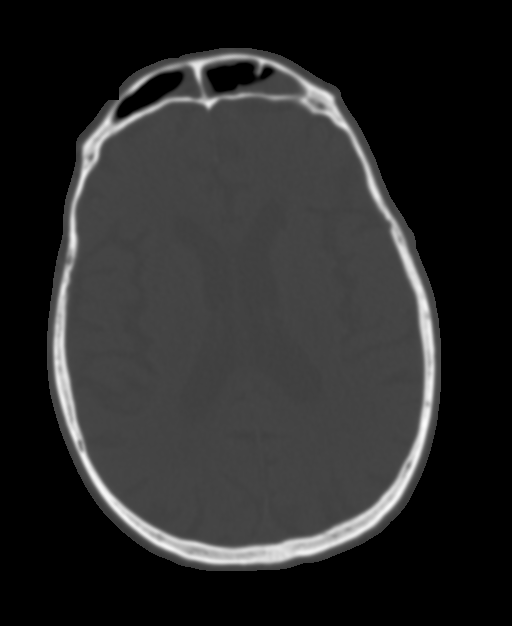
[im 41/61  brain]
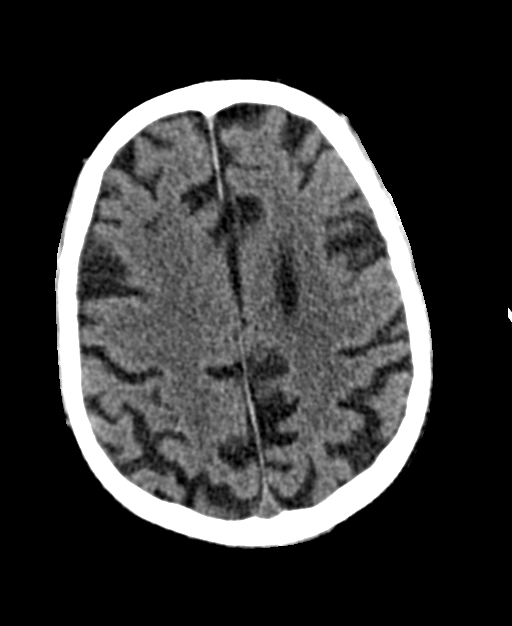
[im 47/61  brain]
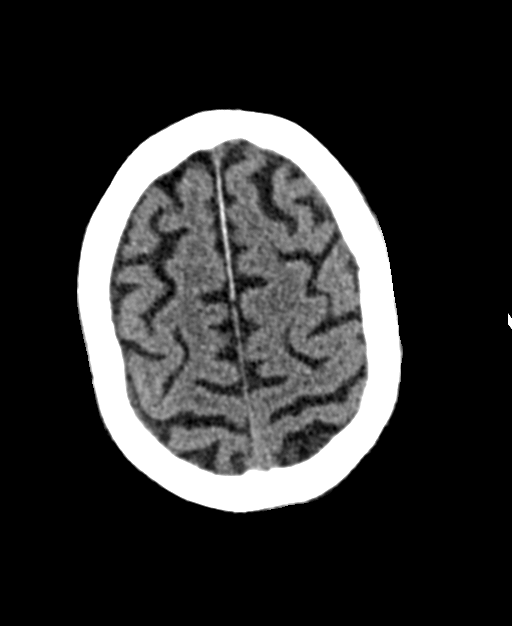
[im 54/61  brain]
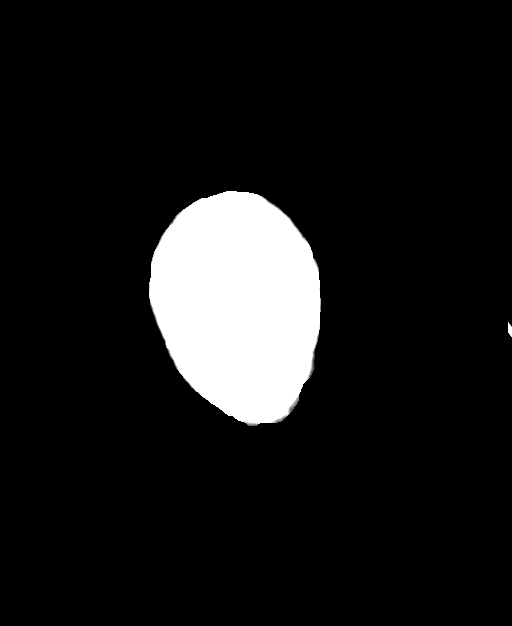

[14 of 47 positions shown; findings below may reference images not displayed]

FINDINGS: Brain: There is extensive encephalomalacia in the right occipital
and posterior right temporal lobes consistent with a chronic PCA
infarct with associated dystrophic calcification and ex vacuo
dilatation of the right lateral ventricle. There is also
encephalomalacia at the right temporoparietal junction (posterior
MCA territory/border zone). A small cortical infarct in the left
occipital lobe is also felt to be chronic. No acute large territory
infarct, intracranial hemorrhage, mass, midline shift, or
extra-axial fluid collection is identified.

Vascular: Calcified atherosclerosis at the skull base. No hyperdense
vessel.

Skull: No fracture or suspicious osseous lesion.

Sinuses/Orbits: Extensive opacification of the paranasal sinuses
bilaterally by combination of mucosal thickening and fluid. Small
bilateral mastoid effusions. Bilateral cataract extraction.

Other: Partially visualized nasoenteric tube.
IMPRESSION: 1. No evidence of acute intracranial abnormality.
2. Multiple chronic infarcts including a large right PCA infarct.

## 2022-10-16 IMAGING — DX DG CHEST 1V PORT
1 series · 1 of 1 positions shown · non-contrast
Comparison: 08/19/2020

CLINICAL DATA: [id number] positivity with increased cough, initial
encounter

EXAM:
PORTABLE CHEST 1 VIEW

[chest ap]
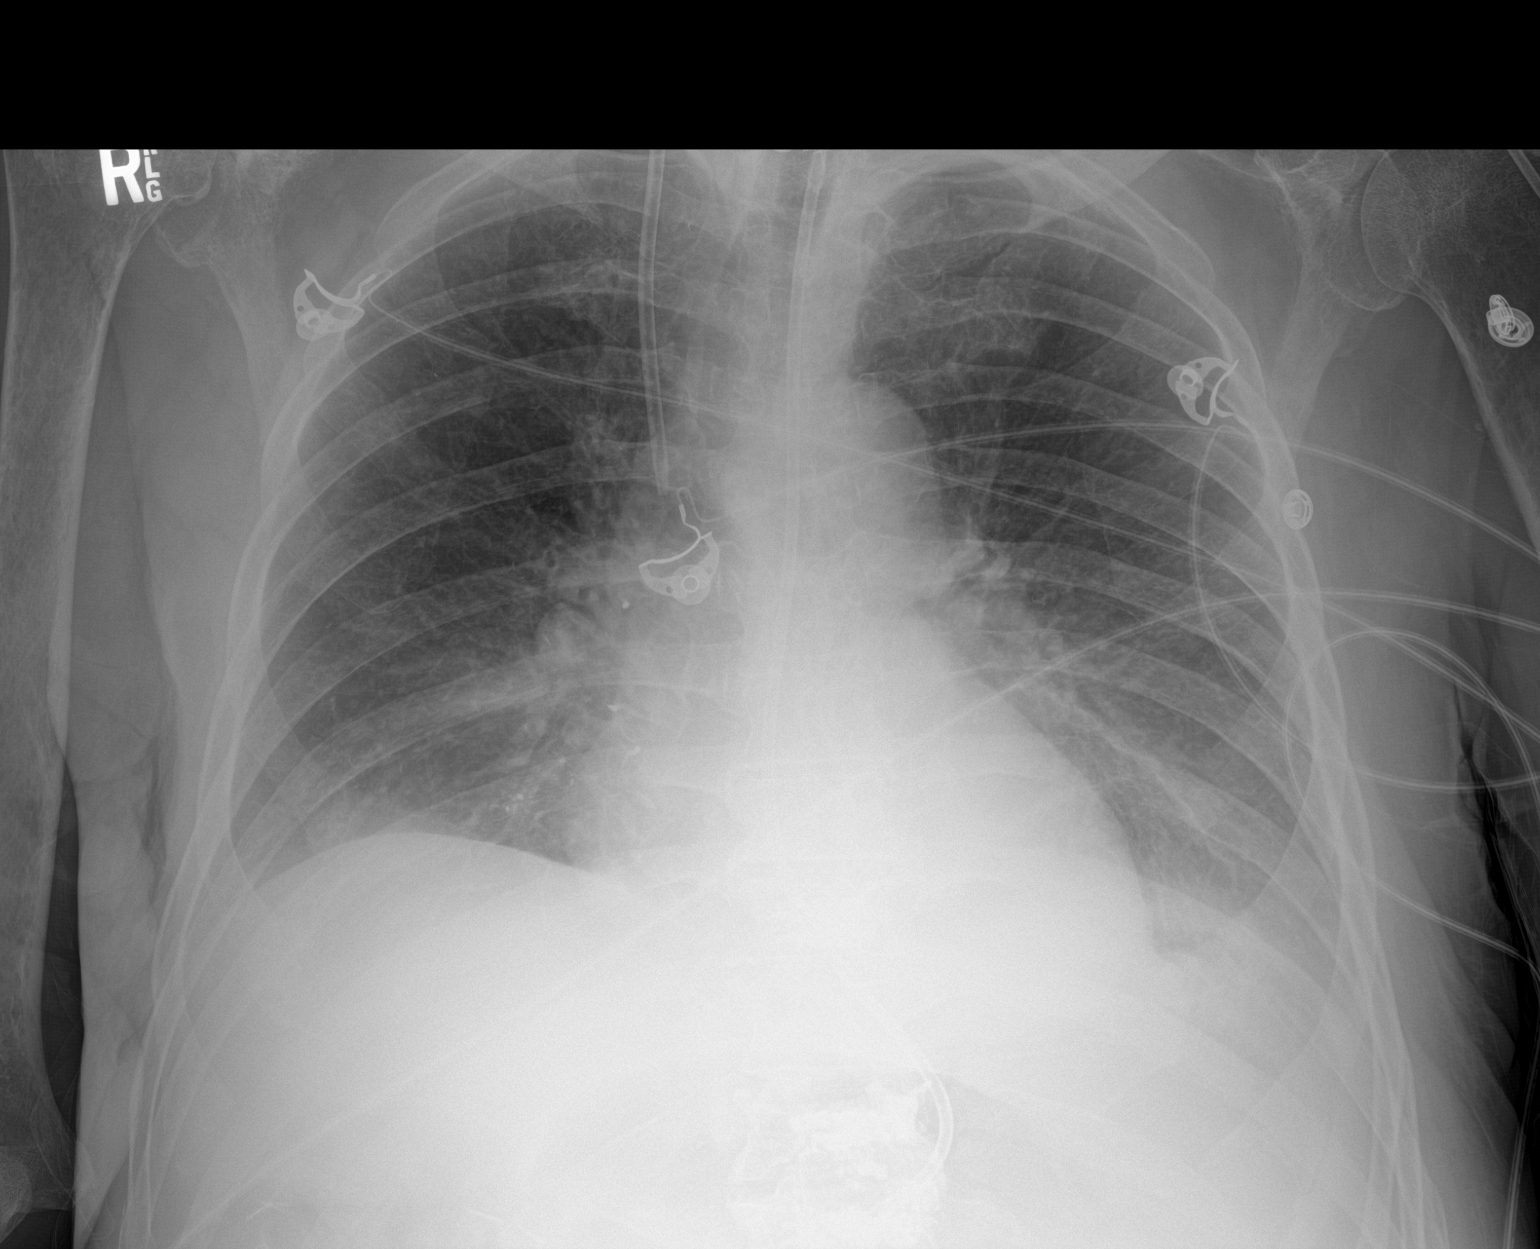

[1 of 1 positions shown; findings below may reference images not displayed]

FINDINGS: Feeding catheter and right jugular central line are again seen and
stable. Postsurgical changes in the cervical spine are noted.
Cardiac shadow is stable. Small basilar effusions are noted. No
focal infiltrate or pneumothorax is noted.
IMPRESSION: Small bilateral pleural effusions.

Tubes and lines as described above stable in appearance.
